# Patient Record
Sex: Female | Born: 1985 | Race: White | Hispanic: Yes | Marital: Married | Smoking: Never smoker
Health system: Southern US, Community
[De-identification: ages and names within clinical notes are randomized; demographics above are authoritative.]

## PROBLEM LIST (undated history)

## (undated) ENCOUNTER — Inpatient Hospital Stay (HOSPITAL_COMMUNITY): Payer: Self-pay

## (undated) DIAGNOSIS — D696 Thrombocytopenia, unspecified: Secondary | ICD-10-CM

## (undated) DIAGNOSIS — F329 Major depressive disorder, single episode, unspecified: Secondary | ICD-10-CM

## (undated) DIAGNOSIS — I10 Essential (primary) hypertension: Secondary | ICD-10-CM

## (undated) DIAGNOSIS — E039 Hypothyroidism, unspecified: Secondary | ICD-10-CM

## (undated) DIAGNOSIS — F32A Depression, unspecified: Secondary | ICD-10-CM

## (undated) DIAGNOSIS — K219 Gastro-esophageal reflux disease without esophagitis: Secondary | ICD-10-CM

## (undated) DIAGNOSIS — G4730 Sleep apnea, unspecified: Secondary | ICD-10-CM

## (undated) DIAGNOSIS — R112 Nausea with vomiting, unspecified: Secondary | ICD-10-CM

## (undated) DIAGNOSIS — Z992 Dependence on renal dialysis: Secondary | ICD-10-CM

## (undated) DIAGNOSIS — N186 End stage renal disease: Secondary | ICD-10-CM

## (undated) DIAGNOSIS — Z9889 Other specified postprocedural states: Secondary | ICD-10-CM

## (undated) DIAGNOSIS — N059 Unspecified nephritic syndrome with unspecified morphologic changes: Secondary | ICD-10-CM

## (undated) DIAGNOSIS — D649 Anemia, unspecified: Secondary | ICD-10-CM

## (undated) HISTORY — DX: Sleep apnea, unspecified: G47.30

## (undated) HISTORY — DX: Major depressive disorder, single episode, unspecified: F32.9

## (undated) HISTORY — DX: End stage renal disease: N18.6

## (undated) HISTORY — DX: Anemia, unspecified: D64.9

## (undated) HISTORY — PX: AV FISTULA PLACEMENT: SHX1204

## (undated) HISTORY — DX: Depression, unspecified: F32.A

---

## 2007-08-12 ENCOUNTER — Ambulatory Visit (HOSPITAL_COMMUNITY): Admission: RE | Admit: 2007-08-12 | Discharge: 2007-08-12 | Payer: Self-pay | Admitting: Family Medicine

## 2007-09-22 ENCOUNTER — Ambulatory Visit (HOSPITAL_COMMUNITY): Admission: RE | Admit: 2007-09-22 | Discharge: 2007-09-22 | Payer: Self-pay | Admitting: Obstetrics & Gynecology

## 2007-10-14 ENCOUNTER — Ambulatory Visit (HOSPITAL_COMMUNITY): Admission: RE | Admit: 2007-10-14 | Discharge: 2007-10-14 | Payer: Self-pay | Admitting: Obstetrics & Gynecology

## 2007-12-01 ENCOUNTER — Inpatient Hospital Stay (HOSPITAL_COMMUNITY): Admission: AD | Admit: 2007-12-01 | Discharge: 2007-12-04 | Payer: Self-pay | Admitting: Obstetrics & Gynecology

## 2007-12-01 ENCOUNTER — Ambulatory Visit: Payer: Self-pay | Admitting: Obstetrics & Gynecology

## 2011-03-06 NOTE — Op Note (Signed)
NAMEGWENDOLEN, Karla Garner                ACCOUNT NO.:  [id number]   MEDICAL RECORD NO.:  [id number]          PATIENT TYPE:  INP   LOCATION:  9126                          FACILITY:  [facility]   PHYSICIAN:  Ruthe Mannan, MD    DATE OF BIRTH:  1986-01-22   DATE OF PROCEDURE:  12/01/2007  DATE OF DISCHARGE:                               OPERATIVE REPORT   PREOPERATIVE DIAGNOSIS:  Intrauterine pregnancy at 38-3/7th weeks  gestation, fetal breech presentation, in active labor.   POSTOPERATIVE DIAGNOSIS:  Intrauterine pregnancy at 38-3/7th weeks  gestation, fetal breech presentation, in active labor.   PROCEDURE:  Primary low transverse cesarean section via Pfannenstiel.   SURGEON:  Dr. Nechama Guard Duru.   ANESTHESIA:  Spinal.   FLUIDS:  2300 mL of lactated Ringer's.   ESTIMATED BLOOD LOSS:  700 mL.   URINE OUTPUT:  500 mL of clear yellow urine at the end of the procedure.   INDICATIONS FOR PROCEDURE:  The patient is a 25 year old G 1, P 0, who  presented at 38-3/7th weeks, in active labor.  The patient was noted to  progress from 1 cm to 5 cm dilation in one to two hours.  On her  cervical check, it was noted that the patient had a breech presentation  which was confirmed with the ultrasound. Given that she was in active  labor with a breech presentation, the patient was counseled regarding  the necessity of a cesarean section.  The risks of the surgery were  explained to the patient with the aid of a Spanish interpreter, and  written informed consent was obtained.   FINDINGS:  Viable female infant in frank breech presentation.  Apgars were  8 and 9.  Weight 5 pounds 2 ounces.  A spontaneous placental delivery,  intact with a three-vessel cord.  Normal uterus, ovaries and tubes  bilaterally.   SPECIMENS:  Placenta which was sent to labor and delivery.   COMPLICATIONS:  None.   DESCRIPTION OF PROCEDURE:  The patient was taken to the operating room  where spinal anesthesia was induced  and found to be adequate.  She was  then placed in a dorsal supine position with a leftward tilt, and  prepped and draped in a sterile manner.  A Pfannenstiel incision was  made with a scalpel and carried through to the underlying layer of  fascia.  The fascia was incised in the midline and this incision was  extended bilaterally using Mayo scissors.  Kocher clamps were applied to  the superior aspect of the fascial incision, and the rectus muscles were  dissected out bluntly and sharply.  A similar process  was carried out  on the inferior aspect of the incision.  The rectus muscles were  separated in the midline, and the peritoneum was entered bluntly.  The  peritoneal incision was extended with good visualization of the bowel  and bladder.  The bladder blade was then inserted.  Attention was then  turned to the lower uterine segment where a bladder flap was created.  A  transverse hysterotomy was made using the  scalpel, and extended bluntly  bilaterally.  The infant was then delivered atraumatically in the breech  presentation.  The baby was noted to have a body cord, and also noted to  have a question of a hydrocele.  The baby's cord was clamped and cut and  handed over to the waiting pediatricians .  Cord blood was collected  according to protocol.  Uterine massage was then administered, and the  placenta was delivered intact.  The uterus was then exteriorized and  cleared of all clot and debris.  The hysterotomy was repaired using #0  Monocryl in a running lock fashion.  A second layer of #0 Monocryl was  used as an imbricating stitch.  Overall good hemostasis was noted.  The  uterus was then returned to the abdomen, and the gutters were cleared of  all clot and debris.  The muscles were reapproximated using two  interrupted stitches of #0 Monocryl.  The fascia was reapproximated  using #0 Vicryl in a running stitch, and the skin was closed with  staples; 10 mL of 0.25% Marcaine was  injected for further local  analgesia.  The patient tolerated the procedure well.  The sponge,  instrument and needle counts were correct x2.  She was taken to the  recovery room in stable condition.      Ruthe Mannan, MD  Electronically Signed     UAD/MEDQ  D:  12/01/2007  T:  12/02/2007  Job:  [id number]

## 2011-03-06 NOTE — Discharge Summary (Signed)
NAMESHEMICKA, Karla Garner      ACCOUNT NO.:  [id number]   MEDICAL RECORD NO.:  [id number]          PATIENT TYPE:  INP   LOCATION:  9126                          FACILITY:  [facility]   PHYSICIAN:  Willey Blade, MD  DATE OF BIRTH:  [birth year]   DATE OF ADMISSION:  12/01/2007  DATE OF DISCHARGE:  12/04/2007                               DISCHARGE SUMMARY   DISCHARGE DIAGNOSES:  1. Primary low transverse cesarean section secondary to breech      presentation of baby.  2. Delivery of viable baby boy.   PROCEDURES:  Karla Garner had transverse C-section.   DISCHARGE LABORATORY:  Hemoglobin 11.2, hematocrit 32.2.  RPR  nonreactive.  GBS negative.  O positive, antibody negative.  HB surface  antigen negative.  Rubella immune, HIV nonreactive.  Chlamydia and  Gonorrhea negative.  Quad screen not done secondary to too late prenatal  care.   DISCHARGE MEDICATIONS:  1. Percocet 5/325  mg 1 p.o. every 4 hours p.r.n. pain.  2. Colace 100 mg 1 p.o. b.i.d. p.r.n. constipation #3.  3. Prenatal vitamins 1 p.o. daily while breast feeding.   HOSPITAL COURSE:  In brief this is a G65, P78 25 year old Hispanic female,  Spanish speaking only who presented in active labor and was found to  have breech presentation so was sent to primary low-segment transverse  cesarean section by Dr.Duru under spinal anesthesia.  No complications  were noted. Estimated blood loss was 700 ml and result was delivery of a  viable baby boy in frank breech presentation.  Apgar 8 and 9 at one and  five minutes respectively. Weight was 5 pounds 2 ounces.  Spontaneous  placenta delivered with 3-vessel cord.  Placenta was sent to L and D.  The patient tolerated the procedure well and wants to breast and bottle  feed baby.  No birth control is desired and no circumcision is desired.  Upon discharge, the patient had decreased pain, decreased bleeding,  positive flatus, was tolerating p.o. diet and ambulating and was voiding  well.   Staples removed prior to discharge.   FOLLOWUP:  The patient to followup with [facility] Department  for 6 weeks postpartum check.      Ria Bush, MD      Willey Blade, MD  Electronically Signed    JG/MEDQ  D:  12/04/2007  T:  12/05/2007  Job:  [id number]

## 2011-03-26 ENCOUNTER — Other Ambulatory Visit (HOSPITAL_COMMUNITY): Payer: Self-pay | Admitting: Nephrology

## 2011-03-26 DIAGNOSIS — R809 Proteinuria, unspecified: Secondary | ICD-10-CM

## 2011-04-03 ENCOUNTER — Other Ambulatory Visit: Payer: Self-pay | Admitting: Interventional Radiology

## 2011-04-03 ENCOUNTER — Ambulatory Visit (HOSPITAL_COMMUNITY)
Admission: RE | Admit: 2011-04-03 | Discharge: 2011-04-03 | Disposition: A | Discharge status: Home / Self Care | Payer: Self-pay | Source: Ambulatory Visit | Attending: Nephrology | Admitting: Nephrology

## 2011-04-03 DIAGNOSIS — R809 Proteinuria, unspecified: Secondary | ICD-10-CM | POA: Insufficient documentation

## 2011-04-03 LAB — PROTIME-INR: Prothrombin Time: 11.6 seconds (ref 11.6–15.2)

## 2011-04-03 LAB — CBC
HCT: 36.1 % (ref 36.0–46.0)
Hemoglobin: 12.4 g/dL (ref 12.0–15.0)
MCH: 28.2 pg (ref 26.0–34.0)
MCHC: 34.3 g/dL (ref 30.0–36.0)
MCV: 82 fL (ref 78.0–100.0)
Platelets: 284 K/uL (ref 150–400)
RBC: 4.4 MIL/uL (ref 3.87–5.11)
RDW: 14.6 % (ref 11.5–15.5)
WBC: 13.4 K/uL — ABNORMAL HIGH (ref 4.0–10.5)

## 2011-04-16 ENCOUNTER — Ambulatory Visit (HOSPITAL_COMMUNITY): Payer: Self-pay | Attending: Orthopedic Surgery

## 2011-04-16 DIAGNOSIS — R809 Proteinuria, unspecified: Secondary | ICD-10-CM | POA: Insufficient documentation

## 2011-05-14 ENCOUNTER — Other Ambulatory Visit: Payer: Self-pay | Admitting: Nephrology

## 2011-05-14 ENCOUNTER — Encounter (HOSPITAL_COMMUNITY): Payer: Self-pay | Attending: Nephrology

## 2011-05-14 DIAGNOSIS — R809 Proteinuria, unspecified: Secondary | ICD-10-CM | POA: Insufficient documentation

## 2011-05-14 LAB — RENAL FUNCTION PANEL
CO2: 27 mEq/L (ref 19–32)
Calcium: 7.4 mg/dL — ABNORMAL LOW (ref 8.4–10.5)
Chloride: 103 mEq/L (ref 96–112)
Creatinine, Ser: 1.12 mg/dL — ABNORMAL HIGH (ref 0.50–1.10)
Glucose, Bld: 109 mg/dL — ABNORMAL HIGH (ref 70–99)
Potassium: 3 mEq/L — ABNORMAL LOW (ref 3.5–5.1)
Sodium: 137 mEq/L (ref 135–145)

## 2011-05-14 LAB — CBC
HCT: 32.9 % — ABNORMAL LOW (ref 36.0–46.0)
MCH: 28.9 pg (ref 26.0–34.0)
MCHC: 34.7 g/dL (ref 30.0–36.0)
MCV: 83.3 fL (ref 78.0–100.0)
RBC: 3.95 MIL/uL (ref 3.87–5.11)
RDW: 14.4 % (ref 11.5–15.5)

## 2011-06-11 ENCOUNTER — Encounter (HOSPITAL_COMMUNITY): Payer: Self-pay | Attending: Nephrology

## 2011-06-11 DIAGNOSIS — R809 Proteinuria, unspecified: Secondary | ICD-10-CM | POA: Insufficient documentation

## 2011-07-09 ENCOUNTER — Encounter (HOSPITAL_COMMUNITY): Payer: Self-pay

## 2011-07-09 ENCOUNTER — Encounter (HOSPITAL_COMMUNITY): Payer: Self-pay | Attending: Nephrology

## 2011-07-09 ENCOUNTER — Other Ambulatory Visit: Payer: Self-pay | Admitting: Nephrology

## 2011-07-09 DIAGNOSIS — N059 Unspecified nephritic syndrome with unspecified morphologic changes: Secondary | ICD-10-CM | POA: Insufficient documentation

## 2011-07-09 LAB — RENAL FUNCTION PANEL
BUN: 19 mg/dL (ref 6–23)
CO2: 26 mEq/L (ref 19–32)
Calcium: 8.6 mg/dL (ref 8.4–10.5)
Chloride: 101 mEq/L (ref 96–112)
Sodium: 135 mEq/L (ref 135–145)

## 2011-07-09 LAB — CBC
HCT: 28.2 % — ABNORMAL LOW (ref 36.0–46.0)
MCH: 28.7 pg (ref 26.0–34.0)
MCHC: 35.5 g/dL (ref 30.0–36.0)
MCV: 81 fL (ref 78.0–100.0)
WBC: 7.8 10*3/uL (ref 4.0–10.5)

## 2011-07-09 LAB — DIFFERENTIAL
Basophils Relative: 1 % (ref 0–1)
Eosinophils Relative: 1 % (ref 0–5)
Lymphocytes Relative: 18 % (ref 12–46)
Monocytes Absolute: 0.7 10*3/uL (ref 0.1–1.0)
Monocytes Relative: 9 % (ref 3–12)
Neutro Abs: 5.6 10*3/uL (ref 1.7–7.7)

## 2011-07-09 LAB — HEPATIC FUNCTION PANEL
Alkaline Phosphatase: 72 U/L (ref 39–117)
Total Bilirubin: 0.3 mg/dL (ref 0.3–1.2)
Total Protein: 6 g/dL (ref 6.0–8.3)

## 2011-07-13 LAB — RPR: RPR Ser Ql: NONREACTIVE

## 2011-07-13 LAB — CBC
HCT: 32.2 — ABNORMAL LOW
HCT: 40.8
MCHC: 34.2
MCV: 86
RBC: 4.75
RDW: 15.6 — ABNORMAL HIGH
WBC: 10.5

## 2011-08-06 ENCOUNTER — Other Ambulatory Visit: Payer: Self-pay | Admitting: Nephrology

## 2011-08-06 ENCOUNTER — Encounter (HOSPITAL_COMMUNITY): Payer: Self-pay | Attending: Nephrology

## 2011-08-06 DIAGNOSIS — N059 Unspecified nephritic syndrome with unspecified morphologic changes: Secondary | ICD-10-CM | POA: Insufficient documentation

## 2011-08-06 LAB — HEPATIC FUNCTION PANEL
Albumin: 3 g/dL — ABNORMAL LOW (ref 3.5–5.2)
Bilirubin, Direct: <0.1 mg/dL (ref 0.0–0.3)
Total Protein: 7.4 g/dL (ref 6.0–8.3)

## 2011-08-06 LAB — RENAL FUNCTION PANEL
Albumin: 3.1 g/dL — ABNORMAL LOW (ref 3.5–5.2)
BUN: 24 mg/dL — ABNORMAL HIGH (ref 6–23)
CO2: 26 mEq/L (ref 19–32)
Calcium: 9.2 mg/dL (ref 8.4–10.5)
Creatinine, Ser: 1.56 mg/dL — ABNORMAL HIGH (ref 0.50–1.10)
GFR calc non Af Amer: 46 mL/min — ABNORMAL LOW (ref >90)
Glucose, Bld: 86 mg/dL (ref 70–99)
Phosphorus: 5.5 mg/dL — ABNORMAL HIGH (ref 2.3–4.6)
Potassium: 3.2 mEq/L — ABNORMAL LOW (ref 3.5–5.1)

## 2011-08-06 LAB — DIFFERENTIAL
Eosinophils Relative: 2 % (ref 0–5)
Lymphs Abs: 1.3 10*3/uL (ref 0.7–4.0)
Monocytes Absolute: 0.6 10*3/uL (ref 0.1–1.0)
Monocytes Relative: 9 % (ref 3–12)
Neutro Abs: 4.8 10*3/uL (ref 1.7–7.7)

## 2011-08-06 LAB — CBC
Hemoglobin: 10.7 g/dL — ABNORMAL LOW (ref 12.0–15.0)
Platelets: 261 10*3/uL (ref 150–400)
WBC: 7.3 10*3/uL (ref 4.0–10.5)

## 2011-08-25 ENCOUNTER — Other Ambulatory Visit (HOSPITAL_COMMUNITY): Payer: Self-pay | Admitting: *Deleted

## 2011-08-31 ENCOUNTER — Other Ambulatory Visit (HOSPITAL_COMMUNITY): Payer: Self-pay | Admitting: *Deleted

## 2011-09-03 ENCOUNTER — Ambulatory Visit (HOSPITAL_COMMUNITY)
Admission: RE | Admit: 2011-09-03 | Discharge: 2011-09-03 | Disposition: A | Discharge status: Home / Self Care | Payer: Self-pay | Source: Ambulatory Visit | Attending: Nephrology | Admitting: Nephrology

## 2011-09-03 ENCOUNTER — Encounter (HOSPITAL_COMMUNITY): Payer: Self-pay

## 2011-09-03 DIAGNOSIS — N059 Unspecified nephritic syndrome with unspecified morphologic changes: Secondary | ICD-10-CM | POA: Insufficient documentation

## 2011-09-03 HISTORY — DX: Unspecified nephritic syndrome with unspecified morphologic changes: N05.9

## 2011-09-03 LAB — COMPREHENSIVE METABOLIC PANEL
AST: 22 U/L (ref 0–37)
CO2: 20 mEq/L (ref 19–32)
Calcium: 8.8 mg/dL (ref 8.4–10.5)
Creatinine, Ser: 1.58 mg/dL — ABNORMAL HIGH (ref 0.50–1.10)
GFR calc non Af Amer: 45 mL/min — ABNORMAL LOW (ref >90)
Total Protein: 6.8 g/dL (ref 6.0–8.3)

## 2011-09-03 LAB — DIFFERENTIAL
Basophils Absolute: 0 10*3/uL (ref 0.0–0.1)
Lymphocytes Relative: 19 % (ref 12–46)
Lymphs Abs: 1.2 10*3/uL (ref 0.7–4.0)
Monocytes Absolute: 0.5 10*3/uL (ref 0.1–1.0)
Neutro Abs: 4.9 10*3/uL (ref 1.7–7.7)

## 2011-09-03 LAB — CBC
MCH: 28.3 pg (ref 26.0–34.0)
MCHC: 34.3 g/dL (ref 30.0–36.0)
MCV: 82.5 fL (ref 78.0–100.0)
Platelets: 217 10*3/uL (ref 150–400)
RBC: 3.6 MIL/uL — ABNORMAL LOW (ref 3.87–5.11)
RDW: 13.4 % (ref 11.5–15.5)

## 2011-09-03 MED ORDER — SODIUM CHLORIDE 0.9 % IV SOLN
300.0000 mg | Freq: Once | INTRAVENOUS | Status: AC
  Administered 2011-09-03: 300 mg via INTRAVENOUS
  Filled 2011-09-03: qty 3

## 2011-09-03 MED ORDER — SODIUM CHLORIDE 0.9 % IV SOLN: INTRAVENOUS | Status: AC

## 2011-09-03 MED ORDER — ACETAMINOPHEN 325 MG PO TABS
ORAL_TABLET | ORAL | Status: AC
  Filled 2011-09-03: qty 2

## 2011-09-03 MED ORDER — ACETAMINOPHEN 325 MG PO TABS
650.0000 mg | ORAL_TABLET | Freq: Four times a day (QID) | ORAL | Status: DC | PRN
  Administered 2011-09-03: 650 mg via ORAL

## 2011-09-03 MED ORDER — SODIUM CHLORIDE 0.9 % IV SOLN
32.0000 mg | Freq: Once | INTRAVENOUS | Status: AC
  Administered 2011-09-03: 32 mg via INTRAVENOUS
  Filled 2011-09-03: qty 16

## 2011-09-03 MED ORDER — SODIUM CHLORIDE 0.9 % IV SOLN
INTRAVENOUS | Status: AC
  Administered 2011-09-03: 10:00:00 via INTRAVENOUS

## 2011-09-03 MED ORDER — ONDANSETRON HCL 4 MG/2ML IJ SOLN: 4.0000 mg | INTRAMUSCULAR | Status: DC

## 2011-09-03 MED ORDER — SODIUM CHLORIDE 0.9 % IV SOLN
500.0000 mg/m2 | Freq: Once | INTRAVENOUS | Status: AC
  Administered 2011-09-03: 640 mg via INTRAVENOUS
  Filled 2011-09-03: qty 32

## 2011-09-03 MED ORDER — LORAZEPAM 1 MG PO TABS
1.0000 mg | ORAL_TABLET | Freq: Once | ORAL | Status: AC
  Administered 2011-09-03: 1 mg via ORAL
  Filled 2011-09-03: qty 1

## 2011-09-03 MED ORDER — SODIUM CHLORIDE 0.9 % IV SOLN: INTRAVENOUS | Status: DC

## 2011-09-03 NOTE — Progress Notes (Signed)
Interpreter  DORI [phone]

## 2011-10-03 ENCOUNTER — Other Ambulatory Visit (HOSPITAL_COMMUNITY): Payer: Self-pay

## 2012-10-22 DIAGNOSIS — D696 Thrombocytopenia, unspecified: Secondary | ICD-10-CM

## 2012-10-22 HISTORY — DX: Thrombocytopenia, unspecified: D69.6

## 2012-10-22 HISTORY — PX: TUBAL LIGATION: SHX77

## 2012-11-26 ENCOUNTER — Encounter: Payer: Self-pay | Admitting: *Deleted

## 2012-12-02 ENCOUNTER — Inpatient Hospital Stay (HOSPITAL_COMMUNITY): Admission: AD | Admit: 2012-12-02 | Payer: Self-pay | Admitting: Obstetrics & Gynecology

## 2013-01-07 ENCOUNTER — Ambulatory Visit (INDEPENDENT_AMBULATORY_CARE_PROVIDER_SITE_OTHER): Payer: Self-pay | Admitting: Advanced Practice Midwife

## 2013-01-07 ENCOUNTER — Encounter: Payer: Self-pay | Admitting: Advanced Practice Midwife

## 2013-01-07 ENCOUNTER — Other Ambulatory Visit: Payer: Self-pay | Admitting: Advanced Practice Midwife

## 2013-01-07 VITALS — BP 138/103 | Temp 98.2°F | Ht <= 58 in | Wt 93.9 lb

## 2013-01-07 DIAGNOSIS — O09219 Supervision of pregnancy with history of pre-term labor, unspecified trimester: Secondary | ICD-10-CM | POA: Insufficient documentation

## 2013-01-07 DIAGNOSIS — O09212 Supervision of pregnancy with history of pre-term labor, second trimester: Secondary | ICD-10-CM

## 2013-01-07 DIAGNOSIS — N059 Unspecified nephritic syndrome with unspecified morphologic changes: Secondary | ICD-10-CM

## 2013-01-07 DIAGNOSIS — Z23 Encounter for immunization: Secondary | ICD-10-CM

## 2013-01-07 DIAGNOSIS — I10 Essential (primary) hypertension: Secondary | ICD-10-CM

## 2013-01-07 DIAGNOSIS — O10019 Pre-existing essential hypertension complicating pregnancy, unspecified trimester: Secondary | ICD-10-CM | POA: Insufficient documentation

## 2013-01-07 DIAGNOSIS — O34219 Maternal care for unspecified type scar from previous cesarean delivery: Secondary | ICD-10-CM

## 2013-01-07 DIAGNOSIS — Z124 Encounter for screening for malignant neoplasm of cervix: Secondary | ICD-10-CM

## 2013-01-07 LAB — COMPREHENSIVE METABOLIC PANEL
AST: 17 U/L (ref 0–37)
Albumin: 3.5 g/dL (ref 3.5–5.2)
BUN: 40 mg/dL — ABNORMAL HIGH (ref 6–23)
Calcium: 8.4 mg/dL (ref 8.4–10.5)
Chloride: 103 mEq/L (ref 96–112)
Glucose, Bld: 81 mg/dL (ref 70–99)
Potassium: 4.4 mEq/L (ref 3.5–5.3)

## 2013-01-07 LAB — POCT URINALYSIS DIP (DEVICE)
Ketones, ur: NEGATIVE mg/dL
Protein, ur: >=300 mg/dL — AB
pH: 7 (ref 5.0–8.0)

## 2013-01-07 LAB — HIV ANTIBODY (ROUTINE TESTING W REFLEX): HIV: NONREACTIVE

## 2013-01-07 MED ORDER — INFLUENZA VIRUS VACC SPLIT PF IM SUSP: 0.5000 mL | Freq: Once | INTRAMUSCULAR | Status: DC

## 2013-01-07 MED ORDER — PRENATAL VITAMINS 0.8 MG PO TABS: 1.0000 | ORAL_TABLET | Freq: Every day | ORAL | Status: DC

## 2013-01-07 MED ORDER — LABETALOL HCL 200 MG PO TABS: 200.0000 mg | ORAL_TABLET | Freq: Two times a day (BID) | ORAL | Status: DC

## 2013-01-07 MED ORDER — HYDROXYPROGESTERONE CAPROATE 250 MG/ML IM OIL: 250.0000 mg | TOPICAL_OIL | INTRAMUSCULAR | Status: DC

## 2013-01-07 MED ORDER — HYDROXYPROGESTERONE CAPROATE 250 MG/ML IM OIL
250.0000 mg | TOPICAL_OIL | Freq: Once | INTRAMUSCULAR | Status: AC
  Administered 2013-01-26: 250 mg via INTRAMUSCULAR

## 2013-01-07 NOTE — Addendum Note (Signed)
Addended by: Langston Reusing on: 01/07/2013 11:38 AM   Modules accepted: Orders, Medications

## 2013-01-07 NOTE — Progress Notes (Signed)
Pulse- 96  Pressure-"baby pressing on bladder"  Vaginal discharge-"yellow discharge with odor that did not notice before pregnancy" New ob packet given BP Recheck 154/98 Flu vaccine consented Weight gain of 25-35lbs  Pt states that she has lost a lot of weight due to vomiting x 3-4 timed per day.  Is unable to keep any food down

## 2013-01-07 NOTE — Progress Notes (Signed)
Subjective:    Karla Garner is being seen today for her first obstetrical visit. She is at [redacted]w[redacted]d gestation. Her obstetrical history is significant for previous preterm delivery at 35 weeks and h/o glomerulonephritis with high blood pressure. Relationship with FOB: spouse, living together. Pregnancy history fully reviewed.  Patient reports headache, nausea, malodorous discharge, vaginal irritation, vomiting and dizziness; Denies bleeding, contractions, cramping, leaking, fever, vision changes, syncope, or swelling.  PMH:  - Glomerulonephritis 1.5 years ago, pt reports received medicine (cyclophosphamide seen in system) IV monthly x 3 months and then followed up with Dr. Justin Mend [facility]) until 7 months ago when had transportation issues. Currently no symptoms (edema) but also has high bp. - HTN: Pt was on an antihypertensive and was changed to something else (called pharmacy - Aldomet 250mg  BID) that she was taking daily. Checks BP frequently at [employer] and SBP 140s. BP today is elevated. Cyndie Mull [id number] - C-section 2009 due to breech presentation, VBAC 2011; h/o preterm delivery 35 weeks; LMP 07-31-12 - Never had pap; no flu shot this year - Surgeries/Hospitalizations - C-section 2009, renal biopsy 2-3 years ago, hospitalized for 2 prior deliveries - Medications: Aldomet 250mg  BID - Pt taking daily - NKDA  SH:  - Lives with children's father, 2 children (46 and 2 yo), denies DV - Denies tobacco, drugs, alcohol; husband smokes outside - Does not work  FH: No medical problems, childhood diseases, early childhood deaths per pt  Review of Systems:   Review of Systems - Per HPI  Objective:    BP 138/103  Temp(Src) 98.2 F (36.8 C)  Ht 4\' 9"  (1.448 m)  Wt 93 lb 14.4 oz (42.593 kg)  BMI 20.31 kg/m2  LMP 07/31/2012 BP on recheck 154/98 Physical Exam  Exam - See flowsheet. Additionally, GEN: NAD, lying on exam table CV: RRR, no murmurs, rubs, gallops; 2+ bilateral DP  pulses present PULM: CTAB, no wheezes or crackles, normal effort ABD: Gravid, 23 cm fundal height, nontender, fetal movement witnessed BACK: No cva tenderness NEURO: Alert, no focal deficits EXTR: No upper or LE edema  GU: Normal external genitalia; on speculum exam, thin clear-whitish discharge; cervix appears red and friable; bimanual exam, no cervical motion tenderness, dilated 1cm, thick, soft; no masses palpated    Assessment:    Pregnancy: [id number] at [redacted]w[redacted]d with normal growth and fetal heart tones, at risk due to maternal renal disease, HTN, and h/o PTD Patient Active Problem List  Diagnosis  . Previous cesarean delivery affecting pregnancy, antepartum  . Glomerulonephritis  . HTN (hypertension)  . High risk pregnancy due to history of preterm labor       Plan:    Initial labs drawn (OB panel, GC/Chlamydia, pap) along with HIV, wet prep, CMET. H/o glomerulonephritis with current elevated BP, less likely secondary to pre-eclampsia than glomerulonephritis:  - Strongly recommended f/u with [facility] as soon as possible for further recommendations - Discontinued aldomet and started labetalol 200mg  BID- rx sent and pharmacy called - Due to danger to fetus of maternal renal disease, along with h/o preterm delivery, f/u with [facility] and consulted MFM for OB anatomy/detailled [place] - 24 hour urine and CMET ordered - ROI filled out for records from [facility] where pt reports dr visit Due to previous preterm delivery, recommend 17-p which pt is amenable to. Ordered weekly 17p shots. Vaginal itching - wet prep today. Flu shot given, Prenatal vitamins ordered. Problem list reviewed and updated. Genetic screening:  Too late for integrated and quad screen. Follow up in 2 weeks in [facility]. F/u on BP, headache, nausea. Delivery planning at f/u.  >50% of 45 min visit spent on counseling and coordination of care.  Discussed plan with attending OB on  call.   Conni Slipper MD [facility] Teaching Service PGY-1 01/07/2013

## 2013-01-07 NOTE — Patient Instructions (Addendum)
Mucho gusto!  Para este embarazo, recomiendo estas medicinas: - vitaminas prenatales diaro - una medicamento para evitar parto temprano, llamado 17-p, cada semana - medicamento para presion alta. (Voy a cambiar su medicamento a labetalol 200mg  dos veces al dia). No toma su otra medicina. Tambien recomiendo que ud ir a su doctor de rinones para un chequeo [phone]) Porque tiene problemas de su rinon, recomendamos que ud ir a la clinica de alta riesgo y Education officer, community las obstetricas para alta riesgo - Ayudaremos a ud Air traffic controller cita. Necesita una ultrasonido. Ayudaremos a Systems analyst cita. Estamos colectando laboratorios hoy. Ud esta recibiendo vacuna contra flu hoy. Si desarolla algunos preocupaciones nos llame. Si tiene [employer], cambios de vision, contraciones, rompe de fuentes, o sangre vaginal, o el bebe para moviendo, llame 9-1-1 y regresa a [place] inmediatamente.  ---------------------------------------------------------- It was good to meet you!  For this pregnancy, we recommend the following medicines: - Prenatal vitamins daily - A medication called 17-p to avoid early delivery, weekly - Medicine for high blood pressure (switching you from your current medicine to labetalol 200mg  TWICE DAILY). Stop taking your other medicine. I also recommend that you see your Kidney Dr (Dr Justin Mend at [facility] [phone]) Because you have kidney problems, I recommend that you go to the high risk clinic and also see a Maternal-Fetal-Medicine high risk Obstetrician. We will help you make this appointment. You need an ultrasound. We will help you make this appointment. We are collecting labs today. You are getting a flu shot today. If you develop concerns, call us. If you have fainting, changes of vision, contractions, your water breaks, or you have vaginal bleeding, or the baby stops moving, call 9-1-1 and go to [facility] immediately.

## 2013-01-08 ENCOUNTER — Other Ambulatory Visit: Payer: Self-pay | Admitting: Obstetrics and Gynecology

## 2013-01-08 DIAGNOSIS — Z0489 Encounter for examination and observation for other specified reasons: Secondary | ICD-10-CM

## 2013-01-08 LAB — OBSTETRIC PANEL
Antibody Screen: NEGATIVE
Basophils Relative: 0 % (ref 0–1)
Eosinophils Absolute: 0 10*3/uL (ref 0.0–0.7)
HCT: 30 % — ABNORMAL LOW (ref 36.0–46.0)
Hemoglobin: 10.6 g/dL — ABNORMAL LOW (ref 12.0–15.0)
MCH: 28.6 pg (ref 26.0–34.0)
MCHC: 35.3 g/dL (ref 30.0–36.0)
Monocytes Absolute: 0.5 10*3/uL (ref 0.1–1.0)
Monocytes Relative: 6 % (ref 3–12)
Neutrophils Relative %: 84 % — ABNORMAL HIGH (ref 43–77)
Rh Type: POSITIVE

## 2013-01-08 LAB — WET PREP, GENITAL: Trich, Wet Prep: NONE SEEN

## 2013-01-09 LAB — CULTURE, OB URINE: Colony Count: >=100000

## 2013-01-09 NOTE — Addendum Note (Signed)
Addended by: Ernie Avena on: 01/09/2013 08:23 AM   Modules accepted: Orders

## 2013-01-10 LAB — CREATININE CLEARANCE, URINE, 24 HOUR
Creatinine Clearance: 22 mL/min — ABNORMAL LOW (ref 75–115)
Creatinine, 24H Ur: 905 mg/d (ref 700–1800)
Creatinine: 2.87 mg/dL — ABNORMAL HIGH (ref 0.50–1.10)

## 2013-01-13 ENCOUNTER — Ambulatory Visit (HOSPITAL_COMMUNITY)
Admission: RE | Admit: 2013-01-13 | Discharge: 2013-01-13 | Disposition: A | Discharge status: Home / Self Care | Payer: Self-pay | Source: Ambulatory Visit | Attending: Obstetrics and Gynecology | Admitting: Obstetrics and Gynecology

## 2013-01-13 ENCOUNTER — Encounter (HOSPITAL_COMMUNITY): Payer: Self-pay

## 2013-01-13 VITALS — BP 135/95 | HR 86 | Wt 98.5 lb

## 2013-01-13 DIAGNOSIS — O10019 Pre-existing essential hypertension complicating pregnancy, unspecified trimester: Secondary | ICD-10-CM | POA: Insufficient documentation

## 2013-01-13 DIAGNOSIS — Z0489 Encounter for examination and observation for other specified reasons: Secondary | ICD-10-CM

## 2013-01-13 DIAGNOSIS — Z363 Encounter for antenatal screening for malformations: Secondary | ICD-10-CM | POA: Insufficient documentation

## 2013-01-13 DIAGNOSIS — O34219 Maternal care for unspecified type scar from previous cesarean delivery: Secondary | ICD-10-CM | POA: Insufficient documentation

## 2013-01-13 DIAGNOSIS — O09212 Supervision of pregnancy with history of pre-term labor, second trimester: Secondary | ICD-10-CM

## 2013-01-13 DIAGNOSIS — O10012 Pre-existing essential hypertension complicating pregnancy, second trimester: Secondary | ICD-10-CM

## 2013-01-13 DIAGNOSIS — N059 Unspecified nephritic syndrome with unspecified morphologic changes: Secondary | ICD-10-CM

## 2013-01-13 DIAGNOSIS — O09219 Supervision of pregnancy with history of pre-term labor, unspecified trimester: Secondary | ICD-10-CM | POA: Insufficient documentation

## 2013-01-13 DIAGNOSIS — IMO0002 Reserved for concepts with insufficient information to code with codable children: Secondary | ICD-10-CM

## 2013-01-13 DIAGNOSIS — Z1389 Encounter for screening for other disorder: Secondary | ICD-10-CM | POA: Insufficient documentation

## 2013-01-13 DIAGNOSIS — O358XX0 Maternal care for other (suspected) fetal abnormality and damage, not applicable or unspecified: Secondary | ICD-10-CM | POA: Insufficient documentation

## 2013-01-13 NOTE — Progress Notes (Signed)
[facility] ultrasound  Indication: 27 yr old G79P1102 at [redacted]w[redacted]d by LMP with glomerulonephritis, renal insufficiency, and chronic hypertension and history of preterm delivery for fetal anatomic survey.  Findings: 1. Single intrauterine pregnancy. 2. Fetal biometry is consistent with [redacted]w[redacted]d; 52nd%. 3. Posterior placenta without evidence of previa. 4. Normal amniotic fluid volume. 5. Normal transabdominal cervical length. 6. The view of the palate is limited. 7. The remainder of the anatomic survey is normal.  Recommendations: 1. Based on today's ultrasound recommend using an estimated due date of 04/25/13. This is the patient's first ultrasound and differs from dating by 12 days. 2. Normal anatomic survey; palate views limited. 3. Chronic hypertension: - see consult letter - recommend fetal growth every 4 weeks - recommend antenatal testing starting at 32 weeks - currently on labetalol - recommend close surveillance for the development of signs/symptoms of preeclampsia 4. Glomerulonephritis/proteinuria/renal insufficiency: - see consult letter - fetal growth and surveillance as above - will make delivery timing recommendations based on renal function and clinical status 5. History of preterm delivery: - see consult letter - recommend close surveillance for the development of signs/symptoms of preterm labor/PPROM 6. Follow up in 4 weeks  Karla [place], MD

## 2013-01-13 NOTE — Consult Note (Signed)
MFM consult   27 yr old [id number] at [redacted]w[redacted]d by LMP with glomerulonephritis, renal insufficiency, and chronic hypertension and history of preterm delivery referred by Dr. Elly Modena for fetal anatomic survey and consult.  Past OB hx: Full term C section; no complications PPROM at 42 weeks- VBAC  PMH: diagnosed with hypertension and glomerulonephritis 1-2 years ago- was previously on medical management but has not seen a Nephrologist with this pregnancy  Ultrasound today shows: fetal biometry is consistent with [redacted]w[redacted]d; 52nd%. Posterior placenta without evidence of previa. Normal amniotic fluid volume. Normal transabdominal cervical length. The view of the palate is limited. The remainder of the anatomic survey is normal.  I counseled the patient as follows:  1. Based on today's ultrasound recommend using an estimated due date of 04/25/13. This is the patient's first ultrasound and differs from dating by 12 days.  2. Normal anatomic survey; palate views limited.  3. Chronic hypertension: - I discussed that women with preexistent hypertension are at increased risk of adverse pregnancy outcome, including preeclampsia, abruption, fetal growth restriction, and perinatal death. The risk increases with severity of hypertension and presence of end-organ damage. Risk of superimposed preeclampsia is 10 to 25 %; risk of abruption is 0.7 to 1.5 %; and risk of fetal growth restriction is 8 to 16 %. I also discussed that risk of preterm delivery is increased and is usually iatrogenic from the complications listed above. Risk of preterm delivery in women with chronic hypertension is 12-34%. There is also an increased risk of requiring C section for the above reasons. I discussed her risks of the above complications are likely significantly higher given her renal insufficiency and proteinuria. Patient' hypertension is currently managed with labetalol. We discussed this is a category C medication and is considered safe in  pregnancy although may have an increased risk of fetal growth restriction. I would recommend targeting therapy to keep systolic blood pressures [id number] and diastolic blood pressures [id number]. I recommend obtaining baseline studies: EKG and uric acid; the remainder of the baseline labs were normal with the exception of the 24 hour urine which showed 1.2g of protein I recommend serial ultrasounds for fetal growth every 4 weeks starting at [redacted] weeks gestation. I recommend close surveillance for the development of signs/symptoms of preeclampsia. I recommend antenatal testing to start at [redacted] weeks gestation. - currently on labetalol  4. Glomerulonephritis/proteinuria/renal insufficiency: - Pregnancy can cause worsening of proteinuria, hypertension, and edema that are usually temporary. However, there may be a permanent decline in kidney function of 1-10% in pregnancy. Given her creatinine is already elevated discussed possibility of needing dialysis and permanent kidney failure. At this time potassium is normal but levels need to be followed closely to determine if dialysis is needed. I discussed given the patient's creatinine is elevated at 2.87 and this significantly increases the risk of adverse outcomes in pregnancy.   There is a significantly increased risk of preeclampsia or eclampsia (may be as high as 20-40%). There is an increased risk of fetal growth restriction, preterm birth (either iatrogenic or spontaneous), and stillbirth.  The patient should have frequent prenatal visits- at least every 2 weeks.  Close monitoring of blood pressures and aggressive management of hypertension.  Recommend fetal growth ultrasounds every 4 weeks.  Recommend antenatal testing starting at [redacted] weeks gestation. Recommend Nephrology consult asap- patient reports she has an appointment in April.  Recommend close surveillance of CMP (consider monthly) and monthly protein/creatinine ratio.  Discussed the increased risk of venous  thrombolic  events with significant proteinuria and pregnancy. At this time protein is 1.2g; if worsens would recommend prophylaxis with lovenox or if there is another risk factor for thromboembolic event (prolonged bed rest, C section, etc) - fetal growth and surveillance as above - will make delivery timing recommendations based on renal function and clinical status  5. History of preterm delivery: - I discussed that with a history of preterm delivery the risk is increased in future pregnancies. Studies have shown that the frequency of recurrent preterm birth was 24 to 22 % after one preterm delivery, 28 to 42 % after two preterm deliveries, and 67% after three preterm deliveries. Term births decreased the risk of preterm birth in subsequent pregnancies.   Patient inquired about weekly injections of 17 OH progesterone. I discussed that use from 16-36 weeks has been shown to decrease the risk of recurrent preterm birth by up to 40%. I discussed we do not know of any adverse effects of progesterone however long term data is limited. However discussed unknown efficacy if started after 25 weeks in pregnancy. Discussed may have potential benefit and may not. Patient reports she likely does not wish to use 17OH progesterone- she will discuss with her primary OB - recommend close surveillance for the development of signs/symptoms of preterm labor/PPROM  6. Follow up in 4 weeks  I spent 60 minutes in face to face consultation with the patient in addition to time spent on the ultrasound.  [place], MD

## 2013-01-14 ENCOUNTER — Encounter: Payer: Self-pay | Admitting: Obstetrics and Gynecology

## 2013-01-14 ENCOUNTER — Encounter: Payer: Self-pay | Admitting: *Deleted

## 2013-01-14 ENCOUNTER — Telehealth: Payer: Self-pay | Admitting: *Deleted

## 2013-01-14 NOTE — Telephone Encounter (Signed)
Will send to [facility] as [phone]

## 2013-01-14 NOTE — Telephone Encounter (Signed)
Called [facility] and left and was told by Suanne Marker patient formerly seen by Dr. Justin Mend- connected to his scheduler Pam and left a message we need for patient to [facility] care with Dr. Justin Mend and please call us with appointment and we will call the patient.,

## 2013-01-14 NOTE — Telephone Encounter (Signed)
Message copied by Samuel Germany on Wed Jan 14, 2013  4:08 PM ------      Message from: [place], [place] L      Created: Thu Jan 08, 2013  2:41 PM      Regarding: referral       Needs referral back to [facility] ASAP            We told her yesterday at [facility] to go back to them, but fear she won't call. Has not been there in 7 mos            Creatinine is way higher... Renal failure maybe            Their number is:      [address], [place], [place] [address]            [phone] ------

## 2013-01-14 NOTE — Telephone Encounter (Signed)
[facility] called back and states patient or someone calling for her had already called back and made an appointment for 01/28/13. Office notes faxed per request.

## 2013-01-19 ENCOUNTER — Encounter: Payer: Self-pay | Admitting: Obstetrics and Gynecology

## 2013-01-19 ENCOUNTER — Other Ambulatory Visit: Payer: Self-pay | Admitting: Obstetrics and Gynecology

## 2013-01-19 ENCOUNTER — Ambulatory Visit (INDEPENDENT_AMBULATORY_CARE_PROVIDER_SITE_OTHER): Payer: Self-pay | Admitting: Obstetrics and Gynecology

## 2013-01-19 VITALS — BP 139/96 | Temp 98.6°F | Wt 95.5 lb

## 2013-01-19 DIAGNOSIS — O09219 Supervision of pregnancy with history of pre-term labor, unspecified trimester: Secondary | ICD-10-CM

## 2013-01-19 DIAGNOSIS — O10019 Pre-existing essential hypertension complicating pregnancy, unspecified trimester: Secondary | ICD-10-CM

## 2013-01-19 DIAGNOSIS — O34219 Maternal care for unspecified type scar from previous cesarean delivery: Secondary | ICD-10-CM

## 2013-01-19 DIAGNOSIS — O09212 Supervision of pregnancy with history of pre-term labor, second trimester: Secondary | ICD-10-CM

## 2013-01-19 DIAGNOSIS — N059 Unspecified nephritic syndrome with unspecified morphologic changes: Secondary | ICD-10-CM

## 2013-01-19 DIAGNOSIS — O10012 Pre-existing essential hypertension complicating pregnancy, second trimester: Secondary | ICD-10-CM

## 2013-01-19 LAB — POCT URINALYSIS DIP (DEVICE)
Glucose, UA: NEGATIVE mg/dL
Specific Gravity, Urine: 1.02 (ref 1.005–1.030)
Urobilinogen, UA: 0.2 mg/dL (ref 0.0–1.0)

## 2013-01-19 MED ORDER — HYDROXYPROGESTERONE CAPROATE 250 MG/ML IM OIL
250.0000 mg | TOPICAL_OIL | Freq: Once | INTRAMUSCULAR | Status: AC
  Administered 2013-01-19: 250 mg via INTRAMUSCULAR

## 2013-01-19 NOTE — Progress Notes (Signed)
Patient doing well without complaints. FM/PTL precautions reviewed. Continue daily labetalol. Continue weekly 17-P. F/U nephrology appointment on 4/9

## 2013-01-19 NOTE — Progress Notes (Signed)
Pulse- 80 

## 2013-01-26 ENCOUNTER — Ambulatory Visit (INDEPENDENT_AMBULATORY_CARE_PROVIDER_SITE_OTHER): Payer: Self-pay | Admitting: General Practice

## 2013-01-26 VITALS — BP 144/95 | HR 73 | Temp 97.6°F | Ht <= 58 in | Wt 97.1 lb

## 2013-01-26 DIAGNOSIS — O34219 Maternal care for unspecified type scar from previous cesarean delivery: Secondary | ICD-10-CM

## 2013-01-26 DIAGNOSIS — O09212 Supervision of pregnancy with history of pre-term labor, second trimester: Secondary | ICD-10-CM

## 2013-01-26 DIAGNOSIS — O09219 Supervision of pregnancy with history of pre-term labor, unspecified trimester: Secondary | ICD-10-CM

## 2013-01-26 NOTE — Progress Notes (Signed)
Discussed patient's high blood pressure with Dr. Kennon Rounds, Dr Kennon Rounds was fine for patient to go home since her blood pressures have been high anyway. Advised patient to seek care at [facility] should she start to feel worse

## 2013-02-02 ENCOUNTER — Ambulatory Visit (INDEPENDENT_AMBULATORY_CARE_PROVIDER_SITE_OTHER): Payer: Self-pay | Admitting: Obstetrics and Gynecology

## 2013-02-02 ENCOUNTER — Encounter: Payer: Self-pay | Admitting: Obstetrics and Gynecology

## 2013-02-02 ENCOUNTER — Other Ambulatory Visit: Payer: Self-pay | Admitting: Obstetrics and Gynecology

## 2013-02-02 VITALS — BP 146/99 | Temp 96.7°F | Wt 96.9 lb

## 2013-02-02 DIAGNOSIS — O10012 Pre-existing essential hypertension complicating pregnancy, second trimester: Secondary | ICD-10-CM

## 2013-02-02 DIAGNOSIS — O09219 Supervision of pregnancy with history of pre-term labor, unspecified trimester: Secondary | ICD-10-CM

## 2013-02-02 DIAGNOSIS — O10019 Pre-existing essential hypertension complicating pregnancy, unspecified trimester: Secondary | ICD-10-CM

## 2013-02-02 DIAGNOSIS — O09212 Supervision of pregnancy with history of pre-term labor, second trimester: Secondary | ICD-10-CM

## 2013-02-02 DIAGNOSIS — N059 Unspecified nephritic syndrome with unspecified morphologic changes: Secondary | ICD-10-CM

## 2013-02-02 LAB — POCT URINALYSIS DIP (DEVICE)
Bilirubin Urine: NEGATIVE
Glucose, UA: NEGATIVE mg/dL
Nitrite: NEGATIVE

## 2013-02-02 MED ORDER — LABETALOL HCL 200 MG PO TABS: 400.0000 mg | ORAL_TABLET | Freq: Two times a day (BID) | ORAL | Status: DC

## 2013-02-02 MED ORDER — HYDROXYPROGESTERONE CAPROATE 250 MG/ML IM OIL
250.0000 mg | TOPICAL_OIL | Freq: Once | INTRAMUSCULAR | Status: AC
  Administered 2013-02-02: 250 mg via INTRAMUSCULAR

## 2013-02-02 NOTE — Progress Notes (Signed)
Pulse 73 States that she took her labetalol at 0800.

## 2013-02-02 NOTE — Addendum Note (Signed)
Addended by: Michel Harrow on: 02/02/2013 01:51 PM   Modules accepted: Orders

## 2013-02-02 NOTE — Progress Notes (Signed)
Elevated BP today. Will increase labetalol to 400 mg BID. Patient has follow-up appointment with nephrologist on 5/16. 4/9 appointment was blood work only. FM/PTL precautions reviewed. Continue weekly 17-P

## 2013-02-09 ENCOUNTER — Encounter: Payer: Self-pay | Admitting: General Practice

## 2013-02-09 ENCOUNTER — Ambulatory Visit (INDEPENDENT_AMBULATORY_CARE_PROVIDER_SITE_OTHER): Payer: Self-pay | Admitting: General Practice

## 2013-02-09 VITALS — BP 135/97 | HR 73 | Temp 98.4°F | Ht <= 58 in | Wt 98.0 lb

## 2013-02-09 DIAGNOSIS — O09213 Supervision of pregnancy with history of pre-term labor, third trimester: Secondary | ICD-10-CM

## 2013-02-09 DIAGNOSIS — O09219 Supervision of pregnancy with history of pre-term labor, unspecified trimester: Secondary | ICD-10-CM

## 2013-02-09 MED ORDER — HYDROXYPROGESTERONE CAPROATE 250 MG/ML IM OIL
250.0000 mg | TOPICAL_OIL | Freq: Once | INTRAMUSCULAR | Status: AC
  Administered 2013-02-09: 250 mg via INTRAMUSCULAR

## 2013-02-10 ENCOUNTER — Ambulatory Visit (HOSPITAL_COMMUNITY)
Admission: RE | Admit: 2013-02-10 | Discharge: 2013-02-10 | Disposition: A | Discharge status: Home / Self Care | Payer: Self-pay | Source: Ambulatory Visit | Attending: Obstetrics & Gynecology | Admitting: Obstetrics & Gynecology

## 2013-02-10 VITALS — BP 135/100 | HR 74 | Wt 98.5 lb

## 2013-02-10 DIAGNOSIS — O10012 Pre-existing essential hypertension complicating pregnancy, second trimester: Secondary | ICD-10-CM

## 2013-02-10 DIAGNOSIS — Z8751 Personal history of pre-term labor: Secondary | ICD-10-CM | POA: Insufficient documentation

## 2013-02-10 DIAGNOSIS — N059 Unspecified nephritic syndrome with unspecified morphologic changes: Secondary | ICD-10-CM

## 2013-02-10 DIAGNOSIS — O10019 Pre-existing essential hypertension complicating pregnancy, unspecified trimester: Secondary | ICD-10-CM | POA: Insufficient documentation

## 2013-02-10 DIAGNOSIS — O34219 Maternal care for unspecified type scar from previous cesarean delivery: Secondary | ICD-10-CM | POA: Insufficient documentation

## 2013-02-10 DIAGNOSIS — O09219 Supervision of pregnancy with history of pre-term labor, unspecified trimester: Secondary | ICD-10-CM | POA: Insufficient documentation

## 2013-02-10 DIAGNOSIS — O09212 Supervision of pregnancy with history of pre-term labor, second trimester: Secondary | ICD-10-CM

## 2013-02-10 NOTE — Progress Notes (Signed)
Karla Garner  was seen today for an ultrasound appointment.  See full report in [facility].  Comments: [name] returns for follow up due to a history of chronic hypertension and renal insufficiency.  Her most recent serum Creatinine was 2.87 mg/dl and baseline 24-hr urine protein was 1.2 g/24 hrs.  She is currently on Labetolol 400 mg BID.   She is currently followed by Nephrology.  BPs in clnic today were 147/110; repeat 135/100  Impression: Single IUP at 29 3/7 weeks Interval growth is appropriate (28th %tile) Normal interval anatomy Normal amniotic fluid volume  Recommendations: Recommend follow-up ultrasound examination in 4 weeks for interval growth. Antepartum fetal testing beginning at [redacted] weeks gestation. May continue to titrate Labetolol upward to maintain BPs in 140's/90's range.  Benjaman Lobe, MD

## 2013-02-11 ENCOUNTER — Encounter: Payer: Self-pay | Admitting: *Deleted

## 2013-02-16 ENCOUNTER — Ambulatory Visit (INDEPENDENT_AMBULATORY_CARE_PROVIDER_SITE_OTHER): Payer: Self-pay | Admitting: Family

## 2013-02-16 VITALS — BP 157/103 | Temp 97.2°F | Wt 96.2 lb

## 2013-02-16 DIAGNOSIS — O09219 Supervision of pregnancy with history of pre-term labor, unspecified trimester: Secondary | ICD-10-CM

## 2013-02-16 DIAGNOSIS — O09212 Supervision of pregnancy with history of pre-term labor, second trimester: Secondary | ICD-10-CM

## 2013-02-16 LAB — POCT URINALYSIS DIP (DEVICE)
Bilirubin Urine: NEGATIVE
Ketones, ur: NEGATIVE mg/dL
Specific Gravity, Urine: 1.02 (ref 1.005–1.030)
pH: 6.5 (ref 5.0–8.0)

## 2013-02-16 LAB — CBC
HCT: 31.4 % — ABNORMAL LOW (ref 36.0–46.0)
Hemoglobin: 11 g/dL — ABNORMAL LOW (ref 12.0–15.0)
MCH: 28.4 pg (ref 26.0–34.0)
Platelets: 210 10*3/uL (ref 150–400)
RDW: 15 % (ref 11.5–15.5)

## 2013-02-16 MED ORDER — HYDROXYPROGESTERONE CAPROATE 250 MG/ML IM OIL
250.0000 mg | TOPICAL_OIL | Freq: Once | INTRAMUSCULAR | Status: AC
  Administered 2013-02-16: 250 mg via INTRAMUSCULAR

## 2013-02-16 NOTE — Progress Notes (Signed)
Patient does not complain of headaches or blurred vision.

## 2013-02-16 NOTE — Progress Notes (Signed)
[place] 4/22 28th percentile; appt with nephrologist 5/16; increase labetalol to 600 mg BID;  cmp and cbc today.  1 hr GCT obtained. Consulted with Dr. Harolyn Rutherford > agrees with medication change.  Obtain protein:creatinine ratio.

## 2013-02-17 LAB — COMPREHENSIVE METABOLIC PANEL
ALT: 16 U/L (ref 0–35)
AST: 23 U/L (ref 0–37)
CO2: 19 mEq/L (ref 19–32)
Chloride: 106 mEq/L (ref 96–112)
Sodium: 139 mEq/L (ref 135–145)
Total Bilirubin: 0.3 mg/dL (ref 0.3–1.2)
Total Protein: 6.9 g/dL (ref 6.0–8.3)

## 2013-02-17 LAB — GLUCOSE TOLERANCE, 1 HOUR (50G) W/O FASTING: Glucose, 1 Hour GTT: 65 mg/dL — ABNORMAL LOW (ref 70–140)

## 2013-02-20 ENCOUNTER — Encounter: Payer: Self-pay | Admitting: Family

## 2013-02-23 ENCOUNTER — Ambulatory Visit (INDEPENDENT_AMBULATORY_CARE_PROVIDER_SITE_OTHER): Payer: Self-pay | Admitting: *Deleted

## 2013-02-23 ENCOUNTER — Encounter (HOSPITAL_COMMUNITY): Payer: Self-pay

## 2013-02-23 ENCOUNTER — Inpatient Hospital Stay (HOSPITAL_COMMUNITY)
Admission: AD | Admit: 2013-02-23 | Discharge: 2013-02-24 | DRG: 781 | Disposition: A | Discharge status: Other Acute Inpatient Hospital | Payer: Medicaid Other | Source: Ambulatory Visit | Attending: Family Medicine | Admitting: Family Medicine

## 2013-02-23 VITALS — BP 158/101 | Temp 97.5°F | Wt 98.5 lb

## 2013-02-23 DIAGNOSIS — O10019 Pre-existing essential hypertension complicating pregnancy, unspecified trimester: Secondary | ICD-10-CM | POA: Diagnosis present

## 2013-02-23 DIAGNOSIS — O09213 Supervision of pregnancy with history of pre-term labor, third trimester: Secondary | ICD-10-CM

## 2013-02-23 DIAGNOSIS — O10419 Pre-existing secondary hypertension complicating pregnancy, unspecified trimester: Secondary | ICD-10-CM

## 2013-02-23 DIAGNOSIS — O10012 Pre-existing essential hypertension complicating pregnancy, second trimester: Secondary | ICD-10-CM

## 2013-02-23 DIAGNOSIS — I151 Hypertension secondary to other renal disorders: Principal | ICD-10-CM | POA: Diagnosis present

## 2013-02-23 DIAGNOSIS — N179 Acute kidney failure, unspecified: Secondary | ICD-10-CM | POA: Diagnosis present

## 2013-02-23 DIAGNOSIS — N059 Unspecified nephritic syndrome with unspecified morphologic changes: Secondary | ICD-10-CM

## 2013-02-23 DIAGNOSIS — O10913 Unspecified pre-existing hypertension complicating pregnancy, third trimester: Secondary | ICD-10-CM

## 2013-02-23 DIAGNOSIS — O09219 Supervision of pregnancy with history of pre-term labor, unspecified trimester: Secondary | ICD-10-CM

## 2013-02-23 DIAGNOSIS — I159 Secondary hypertension, unspecified: Secondary | ICD-10-CM

## 2013-02-23 DIAGNOSIS — N186 End stage renal disease: Secondary | ICD-10-CM | POA: Diagnosis present

## 2013-02-23 DIAGNOSIS — O34219 Maternal care for unspecified type scar from previous cesarean delivery: Secondary | ICD-10-CM | POA: Diagnosis present

## 2013-02-23 DIAGNOSIS — O09212 Supervision of pregnancy with history of pre-term labor, second trimester: Secondary | ICD-10-CM

## 2013-02-23 DIAGNOSIS — E875 Hyperkalemia: Secondary | ICD-10-CM | POA: Diagnosis present

## 2013-02-23 LAB — COMPREHENSIVE METABOLIC PANEL
Albumin: 2.7 g/dL — ABNORMAL LOW (ref 3.5–5.2)
BUN: 50 mg/dL — ABNORMAL HIGH (ref 6–23)
Calcium: 8.5 mg/dL (ref 8.4–10.5)
Creatinine, Ser: 3.86 mg/dL — ABNORMAL HIGH (ref 0.50–1.10)
GFR calc Af Amer: 18 mL/min — ABNORMAL LOW (ref >90)
Glucose, Bld: 83 mg/dL (ref 70–99)
Potassium: 5.5 mEq/L — ABNORMAL HIGH (ref 3.5–5.1)
Total Protein: 7.2 g/dL (ref 6.0–8.3)

## 2013-02-23 LAB — CBC
HCT: 31.1 % — ABNORMAL LOW (ref 36.0–46.0)
Hemoglobin: 10.5 g/dL — ABNORMAL LOW (ref 12.0–15.0)
MCH: 28.2 pg (ref 26.0–34.0)
MCHC: 33.8 g/dL (ref 30.0–36.0)
RDW: 13.8 % (ref 11.5–15.5)

## 2013-02-23 LAB — BASIC METABOLIC PANEL
CO2: 20 mEq/L (ref 19–32)
Calcium: 8.2 mg/dL — ABNORMAL LOW (ref 8.4–10.5)
Chloride: 102 mEq/L (ref 96–112)
Glucose, Bld: 122 mg/dL — ABNORMAL HIGH (ref 70–99)
Sodium: 134 mEq/L — ABNORMAL LOW (ref 135–145)

## 2013-02-23 LAB — GLUCOSE, CAPILLARY: Glucose-Capillary: 144 mg/dL — ABNORMAL HIGH (ref 70–99)

## 2013-02-23 LAB — MAGNESIUM: Magnesium: 2.4 mg/dL (ref 1.5–2.5)

## 2013-02-23 MED ORDER — LACTATED RINGERS IV BOLUS (SEPSIS)
500.0000 mL | Freq: Once | INTRAVENOUS | Status: AC
  Administered 2013-02-23: 15:00:00 via INTRAVENOUS

## 2013-02-23 MED ORDER — SODIUM CHLORIDE 0.9 % IJ SOLN: 3.0000 mL | INTRAMUSCULAR | Status: DC | PRN

## 2013-02-23 MED ORDER — LABETALOL HCL 5 MG/ML IV SOLN
20.0000 mg | INTRAVENOUS | Status: DC | PRN
  Administered 2013-02-23 (×2): 20 mg via INTRAVENOUS
  Filled 2013-02-23 (×2): qty 4

## 2013-02-23 MED ORDER — DOCUSATE SODIUM 100 MG PO CAPS
100.0000 mg | ORAL_CAPSULE | Freq: Three times a day (TID) | ORAL | Status: DC
  Administered 2013-02-24: 100 mg via ORAL
  Filled 2013-02-23: qty 1

## 2013-02-23 MED ORDER — PRENATAL MULTIVITAMIN CH
1.0000 | ORAL_TABLET | Freq: Every day | ORAL | Status: DC
  Filled 2013-02-23 (×2): qty 1

## 2013-02-23 MED ORDER — HYDROXYPROGESTERONE CAPROATE 250 MG/ML IM OIL
250.0000 mg | TOPICAL_OIL | Freq: Once | INTRAMUSCULAR | Status: AC
  Administered 2013-02-23: 250 mg via INTRAMUSCULAR

## 2013-02-23 MED ORDER — INSULIN REGULAR HUMAN 100 UNIT/ML IJ SOLN
10.0000 [IU] | Freq: Once | INTRAMUSCULAR | Status: AC
  Administered 2013-02-23: 10 [IU] via INTRAVENOUS
  Filled 2013-02-23 (×2): qty 0.1

## 2013-02-23 MED ORDER — CALCIUM CARBONATE ANTACID 500 MG PO CHEW
2.0000 | CHEWABLE_TABLET | ORAL | Status: DC | PRN
  Filled 2013-02-23: qty 2

## 2013-02-23 MED ORDER — ZOLPIDEM TARTRATE 5 MG PO TABS: 5.0000 mg | ORAL_TABLET | Freq: Every evening | ORAL | Status: DC | PRN

## 2013-02-23 MED ORDER — ONDANSETRON HCL 4 MG/2ML IJ SOLN
4.0000 mg | Freq: Four times a day (QID) | INTRAMUSCULAR | Status: DC | PRN
  Administered 2013-02-23: 4 mg via INTRAVENOUS
  Filled 2013-02-23: qty 2

## 2013-02-23 MED ORDER — BETAMETHASONE SOD PHOS & ACET 6 (3-3) MG/ML IJ SUSP
12.0000 mg | INTRAMUSCULAR | Status: AC
  Administered 2013-02-23 – 2013-02-24 (×2): 12 mg via INTRAMUSCULAR
  Filled 2013-02-23 (×2): qty 2

## 2013-02-23 MED ORDER — ACETAMINOPHEN 325 MG PO TABS
650.0000 mg | ORAL_TABLET | ORAL | Status: DC | PRN
  Administered 2013-02-23 – 2013-02-24 (×3): 650 mg via ORAL
  Filled 2013-02-23 (×3): qty 2

## 2013-02-23 MED ORDER — SODIUM CHLORIDE 0.9 % IV SOLN: 250.0000 mL | INTRAVENOUS | Status: DC | PRN

## 2013-02-23 MED ORDER — PROMETHAZINE HCL 25 MG/ML IJ SOLN: 12.5000 mg | Freq: Four times a day (QID) | INTRAMUSCULAR | Status: DC | PRN

## 2013-02-23 MED ORDER — LACTATED RINGERS IV SOLN
INTRAVENOUS | Status: DC
  Administered 2013-02-23: 13:00:00 via INTRAVENOUS
  Administered 2013-02-23: 20 mL/h via INTRAVENOUS

## 2013-02-23 MED ORDER — INSULIN REGULAR HUMAN 100 UNIT/ML IJ SOLN
10.0000 [IU] | Freq: Once | INTRAMUSCULAR | Status: DC
  Filled 2013-02-23 (×2): qty 0.1

## 2013-02-23 MED ORDER — ONDANSETRON 4 MG PO TBDP
4.0000 mg | ORAL_TABLET | Freq: Four times a day (QID) | ORAL | Status: DC | PRN
  Filled 2013-02-23: qty 1

## 2013-02-23 MED ORDER — DOCUSATE SODIUM 100 MG PO CAPS
100.0000 mg | ORAL_CAPSULE | Freq: Every day | ORAL | Status: DC
  Filled 2013-02-23 (×2): qty 1

## 2013-02-23 MED ORDER — SODIUM POLYSTYRENE SULFONATE 15 GM/60ML PO SUSP
30.0000 g | Freq: Once | ORAL | Status: AC
  Administered 2013-02-23: 30 g via ORAL
  Filled 2013-02-23: qty 120

## 2013-02-23 MED ORDER — LABETALOL HCL 300 MG PO TABS
600.0000 mg | ORAL_TABLET | Freq: Three times a day (TID) | ORAL | Status: DC
  Administered 2013-02-23 – 2013-02-24 (×3): 600 mg via ORAL
  Filled 2013-02-23 (×6): qty 2

## 2013-02-23 MED ORDER — SODIUM CHLORIDE 0.9 % IJ SOLN: 3.0000 mL | Freq: Two times a day (BID) | INTRAMUSCULAR | Status: DC

## 2013-02-23 MED ORDER — LABETALOL HCL 300 MG PO TABS: 600.0000 mg | ORAL_TABLET | Freq: Two times a day (BID) | ORAL | Status: DC

## 2013-02-23 MED ORDER — INSULIN REGULAR BOLUS VIA INFUSION: 10.0000 [IU] | Freq: Once | INTRAVENOUS | Status: DC

## 2013-02-23 MED ORDER — DEXTROSE 50 % IV SOLN
25.0000 g | Freq: Once | INTRAVENOUS | Status: AC
  Administered 2013-02-23: 25 g via INTRAVENOUS
  Filled 2013-02-23: qty 50

## 2013-02-23 NOTE — H&P (Signed)
Chart reviewed and agree with management and plan.  

## 2013-02-23 NOTE — Progress Notes (Signed)
Calls to AICU and house supervisor to plan the pt transfer of care to AICU.

## 2013-02-23 NOTE — H&P (Signed)
Karla Garner is a 27 y.o. [id number] at [redacted]w[redacted]d admitted for Chronic hypertension with severe range blood pressure and acute on chronic renal failure.   0 Fetal presentation is unsure.  History of Present Illness: Patient seen in clinic today for 17-p injection and was found to have elevated BP (158/101), headache and blurred vision. She is on 600 mg labetalol BID and took her dose this morning at 8 am. She has had a headache and blurred vision for about a week. Does have nausea/vomiting since early in pregnancy. Known hx of glomerulonephritis, last creatinine 2.74 a month ago and 3.74 a week ago.  No fever/chills.  Patient reports the fetal movement as active. Patient reports uterine contraction  activity as none, does have pressure for last 3-4 weeks. Patient reports  vaginal bleeding as none. Patient describes fluid per vagina as None.  Pregnancy history significant for c/section with first pregnancy for breech. Successful VBAC at 35 weeks with second pregnancy.   Patient Active Problem List   Diagnosis Date Noted  . Previous cesarean delivery affecting pregnancy, antepartum 01/07/2013  . Glomerulonephritis 01/07/2013  . Benign essential hypertension antepartum 01/07/2013  . High risk pregnancy due to history of preterm labor 01/07/2013   Past Medical History: Past Medical History  Diagnosis Date  . Glomerulonephritis     Past Surgical History: Past Surgical History  Procedure Laterality Date  . Cesarean section      2009    Obstetrical History: OB History   Grav Para Term Preterm Abortions TAB SAB Ect Mult Living   3 2 1 1      2        Social History: History   Social History  . Marital Status: Single    Spouse Name: N/A    Number of Children: N/A  . Years of Education: N/A   Social History Main Topics  . Smoking status: None  . Smokeless tobacco: None  . Alcohol Use: None  . Drug Use: None  . Sexually Active: None   Other Topics Concern  . None    Social History Narrative  . None    Family History: History reviewed. No pertinent family history.  Allergies: No Known Allergies  Facility-administered medications prior to admission  Medication Dose Route Frequency Provider Last Rate Last Dose  . influenza  inactive virus vaccine (FLUZONE/FLUARIX) injection 0.5 mL  0.5 mL Intramuscular Once Seabron Spates, CNM       Prescriptions prior to admission  Medication Sig Dispense Refill  . hydroxyprogesterone caproate (DELALUTIN) 250 mg/mL OIL Inject 250 mg into the muscle once a week. mondays      . labetalol (NORMODYNE) 200 MG tablet Take 600 mg by mouth 2 (two) times daily.      . Prenatal Vit-Fe Fumarate-FA (PRENATAL MULTIVITAMIN) TABS Take 1 tablet by mouth daily at 12 noon.        Review of Systems - Pertinent positives and negatives listed in HPI  Vitals:  BP 165/94  Pulse 66  Temp(Src) 98.2 F (36.8 C) (Oral)  Resp 16  Ht 4\' 10"  (1.473 m)  Wt 44.906 kg (99 lb)  BMI 20.7 kg/m2  LMP 07/31/2012  Filed Vitals:   02/23/13 1215 02/23/13 1230 02/23/13 1255 02/23/13 1305  BP: 174/107 178/107 174/101 165/94  Pulse: 67 67 64 66  Temp:      TempSrc:      Resp:   16 16  Height:      Weight:  Physical Examination: Gen:  WNWD, no distress HEENT:  NCAT, EOMI, conjunctiva normal CV:  RRR, no murmur Resp:  CTAB Abdomen: gravid and non-tender and fundal height  is size equals dates Pelvic Exam:normal external genitalia, normal vagina. No CMT Cervix: Evaluated by digital exam., Position: posterior, Dilation: 0cm/thick/high Extremities: extremities normal, atraumatic, no cyanosis or edema with DTRs 3+ bilaterally Membranes:intact Fetal Monitoring:Baseline: 130 bpm, Variability: Good {> 6 bpm), Accelerations: Reactive and Decelerations: Absent Labs:  Results for orders placed during the hospital encounter of 02/23/13 (from the past 24 hour(s))  CBC   Collection Time    02/23/13 11:50 AM      Result Value Range    WBC 8.2  4.0 - 10.5 K/uL   RBC 3.72 (*) 3.87 - 5.11 MIL/uL   Hemoglobin 10.5 (*) 12.0 - 15.0 g/dL   HCT 31.1 (*) 36.0 - 46.0 %   MCV 83.6  78.0 - 100.0 fL   MCH 28.2  26.0 - 34.0 pg   MCHC 33.8  30.0 - 36.0 g/dL   RDW 13.8  11.5 - 15.5 %   Platelets 172  150 - 400 K/uL  COMPREHENSIVE METABOLIC PANEL   Collection Time    02/23/13 11:50 AM      Result Value Range   Sodium 135  135 - 145 mEq/L   Potassium 5.5 (*) 3.5 - 5.1 mEq/L   Chloride 104  96 - 112 mEq/L   CO2 20  19 - 32 mEq/L   Glucose, Bld 83  70 - 99 mg/dL   BUN 50 (*) 6 - 23 mg/dL   Creatinine, Ser 3.86 (*) 0.50 - 1.10 mg/dL   Calcium 8.5  8.4 - 10.5 mg/dL   Total Protein 7.2  6.0 - 8.3 g/dL   Albumin 2.7 (*) 3.5 - 5.2 g/dL   AST 25  0 - 37 U/L   ALT 16  0 - 35 U/L   Alkaline Phosphatase 158 (*) 39 - 117 U/L   Total Bilirubin 0.2 (*) 0.3 - 1.2 mg/dL   GFR calc non Af Amer 15 (*) >90 mL/min   GFR calc Af Amer 18 (*) >90 mL/min    Imaging Studies: US Ob Follow Up  02/10/2013  OBSTETRICAL ULTRASOUND: This exam was performed within a [facility]. The OB US report was generated in the [facility] system, and faxed to the ordering physician.   This report is also available in [employer] and in the [employer]. See [facility] Obstetric US report.    . betamethasone acetate-betamethasone sodium phosphate  12 mg Intramuscular Q24H  . docusate sodium  100 mg Oral Daily  . prenatal multivitamin  1 tablet Oral Q1200  . sodium chloride  3 mL Intravenous Q12H   I have reviewed the patient's current medications.  ASSESSMENT: 27 y.o. [id number] at [redacted]w[redacted]d with  - Worsening chronic hypertension with headache and vision changes - acute on chronic renal failure - IUP at [redacted]w[redacted]d (by LMP), NST reassuring - Hyperkalemia at 5.5 meq  PLAN: - Admit to Antenatal - Monitor BP, increase labetalol to 600 TID, IV labetalol as needed. Consider adding Norvasc if still not controlled - 24 hour urine protein (previous  3/21 was 1274 mg) - Monitor electrolytes and Is and Ox closely. - Betamethasone x 2. Hold on Mag given renal insufficiency - Continuous fetal monitoring, sono for AFI - Last ultrasound for growth 4/22 - normal growth and AFI - Discussed with [facility] (Dr. Justin Mend) - call if worsening  renal function or needs dialysis  Martha Clan, MD

## 2013-02-23 NOTE — Progress Notes (Signed)
CRITICAL VALUE ALERT  Critical value received: Potassium 6.2  Date of notification:  02/23/13  Time of notification:  1830  Critical value read back:yes  Nurse who received alert: Kassie Mends, RN  MD notified (1st page):  Dr. Christoper Fabian  Time of first page:  1832  MD notified (2nd page):  Time of second page:  Responding MD:  Dr. Christoper Fabian  Time MD responded:  [phone]

## 2013-02-23 NOTE — Progress Notes (Signed)
24 urine started

## 2013-02-23 NOTE — MAU Note (Signed)
Patient sent from the [facility] for evaluation of elevated blood pressure.

## 2013-02-23 NOTE — Progress Notes (Signed)
Orders received from Dr. Christoper Fabian and resp called to get a stat EKG

## 2013-02-23 NOTE — Plan of Care (Signed)
Problem: Consults Goal: Birthing Suites Patient Information Press F2 to bring up selections list   Pt < [redacted] weeks EGA     

## 2013-02-23 NOTE — Progress Notes (Signed)
Pt for 17 P today but no further ob fu appointments scheduled. Per chart review seen last week , has glomerulonephritis, HTN, hx Ptl, to see nephrology 03/06/13. Labetolol increased to 600mg  po bid last week. Creatinine increased from 2.87 to 3.74 last week. . Discussed with Dr. Elly Modena - checked BP - found to be elevated at 158/101 today with c/o blurry vision, seeing spots of light, and feeling shaky last few days, per Dr. Elly Modena sent to MAU for evaluation. Report called to charge nurse in MAU.

## 2013-02-24 ENCOUNTER — Inpatient Hospital Stay (HOSPITAL_COMMUNITY): Payer: Medicaid Other

## 2013-02-24 DIAGNOSIS — E875 Hyperkalemia: Secondary | ICD-10-CM | POA: Diagnosis present

## 2013-02-24 LAB — RENAL FUNCTION PANEL
CO2: 18 mEq/L — ABNORMAL LOW (ref 19–32)
Calcium: 7.8 mg/dL — ABNORMAL LOW (ref 8.4–10.5)
Chloride: 101 mEq/L (ref 96–112)
GFR calc Af Amer: 17 mL/min — ABNORMAL LOW (ref >90)
GFR calc non Af Amer: 15 mL/min — ABNORMAL LOW (ref >90)
Glucose, Bld: 128 mg/dL — ABNORMAL HIGH (ref 70–99)
Potassium: 5.1 mEq/L (ref 3.5–5.1)
Sodium: 133 mEq/L — ABNORMAL LOW (ref 135–145)

## 2013-02-24 LAB — PROTEIN, URINE, 24 HOUR
Collection Interval-UPROT: 24 hours
Protein, 24H Urine: 2670 mg/d — ABNORMAL HIGH (ref 50–100)
Protein, Urine: 178 mg/dL
Urine Total Volume-UPROT: 1500 mL

## 2013-02-24 LAB — BASIC METABOLIC PANEL
BUN: 53 mg/dL — ABNORMAL HIGH (ref 6–23)
CO2: 19 mEq/L (ref 19–32)
Chloride: 101 mEq/L (ref 96–112)
GFR calc non Af Amer: 14 mL/min — ABNORMAL LOW (ref >90)
Glucose, Bld: 109 mg/dL — ABNORMAL HIGH (ref 70–99)
Potassium: 5.8 mEq/L — ABNORMAL HIGH (ref 3.5–5.1)
Sodium: 133 mEq/L — ABNORMAL LOW (ref 135–145)

## 2013-02-24 LAB — CBC
HCT: 29 % — ABNORMAL LOW (ref 36.0–46.0)
Hemoglobin: 10 g/dL — ABNORMAL LOW (ref 12.0–15.0)
MCHC: 34.5 g/dL (ref 30.0–36.0)
RBC: 3.46 MIL/uL — ABNORMAL LOW (ref 3.87–5.11)
WBC: 9 10*3/uL (ref 4.0–10.5)

## 2013-02-24 LAB — CREATININE CLEARANCE, URINE, 24 HOUR
Creatinine: 3.91 mg/dL — ABNORMAL HIGH (ref 0.50–1.10)
Urine Total Volume-CRCL: 1500 mL

## 2013-02-24 MED ORDER — SODIUM CHLORIDE 0.9 % IV BOLUS (SEPSIS)
500.0000 mL | Freq: Once | INTRAVENOUS | Status: AC
  Administered 2013-02-24: 500 mL via INTRAVENOUS

## 2013-02-24 MED ORDER — SODIUM CHLORIDE 0.9 % IV SOLN
INTRAVENOUS | Status: DC
  Administered 2013-02-24: 01:00:00 via INTRAVENOUS

## 2013-02-24 NOTE — Progress Notes (Signed)
Patient ID: Karla Garner, female   DOB: 1986/05/12, 27 y.o.   MRN: [id number]  [place] NOTE  Karla Garner is a 27 y.o. [id number] at [redacted]w[redacted]d  who is admitted for worsening Chronic Hypertension and Acute on Chronic Renal Failure.  Estimated Date of Delivery: 05/07/13 Fetal presentation is unsure.  Length of Stay:  1 Days. 02/23/2013  Subjective: Pt reports mild headache, improved, no vision changes. Hands/forearms feel swollen. Patient reports good fetal movement.  She reports no uterine contractions, no bleeding and no loss of fluid per vagina.  Vitals:  Blood pressure 146/87, pulse 70, temperature 97.8 F (36.6 C), temperature source Axillary, resp. rate 15, height 4\' 10"  (1.473 m), weight 44.906 kg (99 lb), last menstrual period 07/31/2012, SpO2 98.00%.  Filed Vitals:   02/24/13 0300 02/24/13 0400 02/24/13 0500 02/24/13 0600  BP: 153/98 139/88 142/90 146/87  Pulse: 72 77 85 70  Temp: 97.8 F (36.6 C)     TempSrc: Axillary     Resp: 13 13 15 15   Height:      Weight:      SpO2: 98% 98% 98% 98%    Physical Examination: GEN:  Alert, no distress HEENT:  NCAT, EOMI, conjunctiva clear CV:  RRR, no murmur RESP:  CTAB ABD:  Gravid, non-tender Cervical Exam: Not evaluated.  Extremities: extremities normal, atraumatic, no cyanosis. Mild upper extremity edema - hands stiff, no pitting. DTRs 3+ bilaterally Membranes:intact  Fetal Monitoring:  Baseline: 120 bpm, Variability: Good {> 6 bpm), Accelerations: Reactive and Decelerations: Absent  Labs:  Results for orders placed during the hospital encounter of 02/23/13 (from the past 24 hour(s))  CBC   Collection Time    02/23/13 11:50 AM      Result Value Range   WBC 8.2  4.0 - 10.5 K/uL   RBC 3.72 (*) 3.87 - 5.11 MIL/uL   Hemoglobin 10.5 (*) 12.0 - 15.0 g/dL   HCT 31.1 (*) 36.0 - 46.0 %   MCV 83.6  78.0 - 100.0 fL   MCH 28.2  26.0 - 34.0 pg   MCHC 33.8  30.0 - 36.0 g/dL   RDW  13.8  11.5 - 15.5 %   Platelets 172  150 - 400 K/uL  COMPREHENSIVE METABOLIC PANEL   Collection Time    02/23/13 11:50 AM      Result Value Range   Sodium 135  135 - 145 mEq/L   Potassium 5.5 (*) 3.5 - 5.1 mEq/L   Chloride 104  96 - 112 mEq/L   CO2 20  19 - 32 mEq/L   Glucose, Bld 83  70 - 99 mg/dL   BUN 50 (*) 6 - 23 mg/dL   Creatinine, Ser 3.86 (*) 0.50 - 1.10 mg/dL   Calcium 8.5  8.4 - 10.5 mg/dL   Total Protein 7.2  6.0 - 8.3 g/dL   Albumin 2.7 (*) 3.5 - 5.2 g/dL   AST 25  0 - 37 U/L   ALT 16  0 - 35 U/L   Alkaline Phosphatase 158 (*) 39 - 117 U/L   Total Bilirubin 0.2 (*) 0.3 - 1.2 mg/dL   GFR calc non Af Amer 15 (*) >90 mL/min   GFR calc Af Amer 18 (*) >90 mL/min  PHOSPHORUS   Collection Time    02/23/13 11:50 AM      Result Value Range   Phosphorus 4.1  2.3 - 4.6 mg/dL  MAGNESIUM   Collection Time    02/23/13  11:50 AM      Result Value Range   Magnesium 2.4  1.5 - 2.5 mg/dL  BASIC METABOLIC PANEL   Collection Time    02/23/13  5:40 PM      Result Value Range   Sodium 134 (*) 135 - 145 mEq/L   Potassium 6.2 (*) 3.5 - 5.1 mEq/L   Chloride 102  96 - 112 mEq/L   CO2 20  19 - 32 mEq/L   Glucose, Bld 122 (*) 70 - 99 mg/dL   BUN 49 (*) 6 - 23 mg/dL   Creatinine, Ser 3.79 (*) 0.50 - 1.10 mg/dL   Calcium 8.2 (*) 8.4 - 10.5 mg/dL   GFR calc non Af Amer 16 (*) >90 mL/min   GFR calc Af Amer 18 (*) >90 mL/min  MRSA PCR SCREENING   Collection Time    02/23/13  9:07 PM      Result Value Range   MRSA by PCR NEGATIVE  NEGATIVE  GLUCOSE, CAPILLARY   Collection Time    02/23/13  9:42 PM      Result Value Range   Glucose-Capillary 144 (*) 70 - 99 mg/dL  BASIC METABOLIC PANEL   Collection Time    02/23/13 11:30 PM      Result Value Range   Sodium 133 (*) 135 - 145 mEq/L   Potassium 5.8 (*) 3.5 - 5.1 mEq/L   Chloride 101  96 - 112 mEq/L   CO2 19  19 - 32 mEq/L   Glucose, Bld 109 (*) 70 - 99 mg/dL   BUN 53 (*) 6 - 23 mg/dL   Creatinine, Ser 4.09 (*) 0.50 - 1.10 mg/dL    Calcium 8.6  8.4 - 10.5 mg/dL   GFR calc non Af Amer 14 (*) >90 mL/min   GFR calc Af Amer 17 (*) >90 mL/min  RENAL FUNCTION PANEL   Collection Time    02/24/13  5:00 AM      Result Value Range   Sodium 133 (*) 135 - 145 mEq/L   Potassium 5.1  3.5 - 5.1 mEq/L   Chloride 101  96 - 112 mEq/L   CO2 18 (*) 19 - 32 mEq/L   Glucose, Bld 128 (*) 70 - 99 mg/dL   BUN 51 (*) 6 - 23 mg/dL   Creatinine, Ser 3.91 (*) 0.50 - 1.10 mg/dL   Calcium 7.8 (*) 8.4 - 10.5 mg/dL   Phosphorus 3.8  2.3 - 4.6 mg/dL   Albumin 2.4 (*) 3.5 - 5.2 g/dL   GFR calc non Af Amer 15 (*) >90 mL/min   GFR calc Af Amer 17 (*) >90 mL/min  CBC   Collection Time    02/24/13  5:00 AM      Result Value Range   WBC 9.0  4.0 - 10.5 K/uL   RBC 3.46 (*) 3.87 - 5.11 MIL/uL   Hemoglobin 10.0 (*) 12.0 - 15.0 g/dL   HCT 29.0 (*) 36.0 - 46.0 %   MCV 83.8  78.0 - 100.0 fL   MCH 28.9  26.0 - 34.0 pg   MCHC 34.5  30.0 - 36.0 g/dL   RDW 13.9  11.5 - 15.5 %   Platelets 174  150 - 400 K/uL    Imaging Studies:    None new   Medications:  Scheduled . betamethasone acetate-betamethasone sodium phosphate  12 mg Intramuscular Q24H  . docusate sodium  100 mg Oral Q8H  . labetalol  600 mg Oral TID  . prenatal  multivitamin  1 tablet Oral Q1200  . sodium chloride  3 mL Intravenous Q12H   I have reviewed the patient's current medications.  ASSESSMENT: Patient Active Problem List   Diagnosis Date Noted  . Acute on chronic renal failure 02/23/2013  . Previous cesarean delivery affecting pregnancy, antepartum 01/07/2013  . Glomerulonephritis 01/07/2013  . Benign essential hypertension antepartum 01/07/2013  . High risk pregnancy due to history of preterm labor 01/07/2013    PLAN: - FHTs reactive - BP controlled on PO labetalol at 600 mg TID - 24 hour urine finished at 4 pm, LFTs and platelets normal - Creatinine worse from admission 3.88 > 3.79 > 4.09 > 3.91 - [facility] consulted:  - Potassium 5.5 > 6.2 > 5.8 >  5.1.  S/p kayexcalate x  30 g x 1; insulin and glucose. Monitor, on telemetry overnight, EKG normal - BMZ #2 due at 1 pm - Consult MFM today - Continue routine antenatal care  Martha Clan, MD

## 2013-02-24 NOTE — Progress Notes (Signed)
UR chart review completed.  

## 2013-02-24 NOTE — Progress Notes (Signed)
Report given to Cyril Mourning (Camera operator on L&D at [facility]) and [place]).  Transferred to [facility] via [facility] @ 1640. FHR=130 upon discharge. Pt reports + FM with intermittent pelvic pressure. Pt denies any other complaints. Spanish interpreter at bedside.

## 2013-02-24 NOTE — Consult Note (Signed)
[place] [place] CONSULT  Patient Name: Karla Garner Medical Record Number:  [id number] Date of Birth: 10-Jan-1986 Requesting Physician Name:  Donnamae Jude, MD Date of Service: 02/24/2013  Chief Complaint Acute on chronic renal failure  History of Present Illness Karla Garner was seen today due to acute on chronic renal faliure at the request of Donnamae Jude, MD.  The patient is a 27 y.o. [id number] [redacted]w[redacted]d with an EDD of 05/07/2013, by Last Menstrual Period dating method.  She has known chronic renal failure, due to IgA nephropathy, originally diagnosed approximately 18 months ago.  She was found to be hypertensive during a recent OB visit and was admitted to the hospital.  Laboratory work up as part of admission noted a worsening of her creatinine and hyperkalemia (peak potassium of 6.1).  She also reported headache and visual changes at the time of admission.  The visual changes have resolved.  She continues to intermittent have a headache which is relieved with Tylenol.  Her BP has remained in the mild range since admission with an increase in labetalol to 600 mg po tid.  She also received a dose of kaexylate which improved her most recent potassium to 5.1.  She denies, vaginal bleeding, loss of fluid, or contractions.  Fetal movement is normal.  Review of Systems Pertinent items are noted in HPI.  Patient History OB History   Grav Para Term Preterm Abortions TAB SAB Ect Mult Living   3 2 1 1      2      # Outc Date GA Lbr Len/2nd Wgt Sex Del Anes PTL Lv   1 TRM 2/09 [redacted]w[redacted]d  5lb7oz(2.466kg) M CS EPI  Yes   2 PRE 11/11 [redacted]w[redacted]d  4lb4oz(1.928kg) M SVD None Yes Yes   3 CUR               Past Medical History  Diagnosis Date  . Glomerulonephritis     Past Surgical History  Procedure Laterality Date  . Cesarean section      2009    History   Social History  . Marital Status: Single    Spouse Name: N/A    Number of Children: N/A  . Years of Education: N/A    Social History Main Topics  . Smoking status: Never Smoker   . Smokeless tobacco: Never Used  . Alcohol Use: No  . Drug Use: No  . Sexually Active: Yes   Other Topics Concern  . None   Social History Narrative  . None    Physical Examination Filed Vitals:   02/24/13 1200  BP: 144/85  Pulse: 80  Temp: 98.6 F (37 C)  Resp: 16   General appearance - alert, well appearing, and in no distress Abdomen - soft, nontender, nondistended, no masses or organomegaly Extremities - pedal edema 2 +  Results for orders placed during the hospital encounter of 02/23/13 (from the past 48 hour(s))  CBC     Status: Abnormal   Collection Time    02/23/13 11:50 AM      Result Value Range   WBC 8.2  4.0 - 10.5 K/uL   RBC 3.72 (*) 3.87 - 5.11 MIL/uL   Hemoglobin 10.5 (*) 12.0 - 15.0 g/dL   HCT 31.1 (*) 36.0 - 46.0 %   MCV 83.6  78.0 - 100.0 fL   MCH 28.2  26.0 - 34.0 pg   MCHC 33.8  30.0 - 36.0 g/dL   RDW 13.8  11.5 -  15.5 %   Platelets 172  150 - 400 K/uL  COMPREHENSIVE METABOLIC PANEL     Status: Abnormal   Collection Time    02/23/13 11:50 AM      Result Value Range   Sodium 135  135 - 145 mEq/L   Potassium 5.5 (*) 3.5 - 5.1 mEq/L   Chloride 104  96 - 112 mEq/L   CO2 20  19 - 32 mEq/L   Glucose, Bld 83  70 - 99 mg/dL   BUN 50 (*) 6 - 23 mg/dL   Creatinine, Ser 3.86 (*) 0.50 - 1.10 mg/dL   Calcium 8.5  8.4 - 10.5 mg/dL   Total Protein 7.2  6.0 - 8.3 g/dL   Albumin 2.7 (*) 3.5 - 5.2 g/dL   AST 25  0 - 37 U/L   ALT 16  0 - 35 U/L   Alkaline Phosphatase 158 (*) 39 - 117 U/L   Total Bilirubin 0.2 (*) 0.3 - 1.2 mg/dL   GFR calc non Af Amer 15 (*) >90 mL/min   GFR calc Af Amer 18 (*) >90 mL/min   Comment:            The eGFR has been calculated     using the CKD EPI equation.     This calculation has not been     validated in all clinical     situations.     eGFR's persistently     <90 mL/min signify     possible Chronic Kidney Disease.  PHOSPHORUS     Status: None    Collection Time    02/23/13 11:50 AM      Result Value Range   Phosphorus 4.1  2.3 - 4.6 mg/dL  MAGNESIUM     Status: None   Collection Time    02/23/13 11:50 AM      Result Value Range   Magnesium 2.4  1.5 - 2.5 mg/dL  BASIC METABOLIC PANEL     Status: Abnormal   Collection Time    02/23/13  5:40 PM      Result Value Range   Sodium 134 (*) 135 - 145 mEq/L   Potassium 6.2 (*) 3.5 - 5.1 mEq/L   Comment: REPEATED TO VERIFY   Chloride 102  96 - 112 mEq/L   CO2 20  19 - 32 mEq/L   Glucose, Bld 122 (*) 70 - 99 mg/dL   BUN 49 (*) 6 - 23 mg/dL   Creatinine, Ser 3.79 (*) 0.50 - 1.10 mg/dL   Calcium 8.2 (*) 8.4 - 10.5 mg/dL   GFR calc non Af Amer 16 (*) >90 mL/min   GFR calc Af Amer 18 (*) >90 mL/min   Comment:            The eGFR has been calculated     using the CKD EPI equation.     This calculation has not been     validated in all clinical     situations.     eGFR's persistently     <90 mL/min signify     possible Chronic Kidney Disease.  MRSA PCR SCREENING     Status: None   Collection Time    02/23/13  9:07 PM      Result Value Range   MRSA by PCR NEGATIVE  NEGATIVE   Comment:            The GeneXpert MRSA Assay (FDA     approved for NASAL specimens  only), is one component of a     comprehensive MRSA colonization     surveillance program. It is not     intended to diagnose MRSA     infection nor to guide or     monitor treatment for     MRSA infections.  GLUCOSE, CAPILLARY     Status: Abnormal   Collection Time    02/23/13  9:42 PM      Result Value Range   Glucose-Capillary 144 (*) 70 - 99 mg/dL  BASIC METABOLIC PANEL     Status: Abnormal   Collection Time    02/23/13 11:30 PM      Result Value Range   Sodium 133 (*) 135 - 145 mEq/L   Potassium 5.8 (*) 3.5 - 5.1 mEq/L   Comment: NO VISIBLE HEMOLYSIS   Chloride 101  96 - 112 mEq/L   CO2 19  19 - 32 mEq/L   Glucose, Bld 109 (*) 70 - 99 mg/dL   BUN 53 (*) 6 - 23 mg/dL   Creatinine, Ser 4.09 (*) 0.50 -  1.10 mg/dL   Calcium 8.6  8.4 - 10.5 mg/dL   GFR calc non Af Amer 14 (*) >90 mL/min   GFR calc Af Amer 17 (*) >90 mL/min   Comment:            The eGFR has been calculated     using the CKD EPI equation.     This calculation has not been     validated in all clinical     situations.     eGFR's persistently     <90 mL/min signify     possible Chronic Kidney Disease.  RENAL FUNCTION PANEL     Status: Abnormal   Collection Time    02/24/13  5:00 AM      Result Value Range   Sodium 133 (*) 135 - 145 mEq/L   Potassium 5.1  3.5 - 5.1 mEq/L   Comment: NO VISIBLE HEMOLYSIS   Chloride 101  96 - 112 mEq/L   CO2 18 (*) 19 - 32 mEq/L   Glucose, Bld 128 (*) 70 - 99 mg/dL   BUN 51 (*) 6 - 23 mg/dL   Creatinine, Ser 3.91 (*) 0.50 - 1.10 mg/dL   Calcium 7.8 (*) 8.4 - 10.5 mg/dL   Phosphorus 3.8  2.3 - 4.6 mg/dL   Albumin 2.4 (*) 3.5 - 5.2 g/dL   GFR calc non Af Amer 15 (*) >90 mL/min   GFR calc Af Amer 17 (*) >90 mL/min   Comment:            The eGFR has been calculated     using the CKD EPI equation.     This calculation has not been     validated in all clinical     situations.     eGFR's persistently     <90 mL/min signify     possible Chronic Kidney Disease.  CBC     Status: Abnormal   Collection Time    02/24/13  5:00 AM      Result Value Range   WBC 9.0  4.0 - 10.5 K/uL   RBC 3.46 (*) 3.87 - 5.11 MIL/uL   Hemoglobin 10.0 (*) 12.0 - 15.0 g/dL   HCT 29.0 (*) 36.0 - 46.0 %   MCV 83.8  78.0 - 100.0 fL   MCH 28.9  26.0 - 34.0 pg   MCHC 34.5  30.0 - 36.0 g/dL  RDW 13.9  11.5 - 15.5 %   Platelets 174  150 - 400 K/uL     Assessment and Recommendations 1.  Acute on chronic renal failure.  Ms. Melburn Hake Dominguez's renal function has clearly worsened over the course of pregnancy.  Her potassium, creatinine, and BUN are now 5.1, 3.9, and 53 respectively.  I have spoken with her nephrologist Dr. Justin Mend, who believes that with the stabilization of her blood pressure and serum potassium  she has no acute indication for dialysis at this time, although he suspects it may be needed before the end of pregnancy.  The elevated BUN is of particular concern as azotemia is associated with preterm labor, IUFD, and stillbirth.  Thus, she may need dialysis earlier than she would outside of pregnancy.  She will require daily laboratory assessments to determine the need for dialysis.  I have spoken with Dr. Harolyn Rutherford from the faculty practice about Ms. Flores Dominguez's situation.  As dialysis cannot be performed at [place], she feels that Ms. Coraline Beechler will need to be transferred to [facility] for further care, and I am in agreement.  Arrangements are being made for the transfer.   Jolyn Lent, MD

## 2013-02-24 NOTE — Plan of Care (Signed)
Problem: Consults Goal: Birthing Suites Patient Information Press F2 to bring up selections list  Outcome: Not Applicable Date Met:  [id number]  Non-English Speaking Goal: NICU Tour Outcome: Not Met (add Reason) Pt transferred to [facility] prior to NICU tour.     Problem: Discharge Progression Outcomes Goal: Discharge plan in place and appropriate Outcome: Not Applicable Date Met:  [id number] Pt is to be transfer to [facility]

## 2013-02-24 NOTE — Discharge Summary (Signed)
Physician Transfer Summary  Patient ID: Karla Garner MRN: [id number] DOB/AGE: 1985/12/21 27 y.o.  Admit date: 02/23/2013 Discharge date: 02/24/2013  Admission Diagnoses: Intrauterine pregnancy at [redacted]w[redacted]d; acute on chronic renal failure, chronic hypertension, concern about superimposed preeclampsia  Discharge Diagnoses: Intrauterine pregnancy at [redacted]w[redacted]d; same as above and also refer to problem list below  Active Problems: Acute on chronic renal failure Hyperkalemia Glomerulonephritis Benign essential hypertension antepartum Previous cesarean delivery affecting pregnancy, antepartum High risk pregnancy due to history of preterm labor  Discharged Condition: Stable  Hospital Course: Patient is a 27 y.o. [id number] [redacted]w[redacted]d with an EDD of 05/07/2013 by LMP. 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 She also has chronic hypertension as a result of her renal disease, and she was treated with Labetalol that was steadily increased to control her blood pressures.  On 02/23/13, she was seen in clinic for her prenatal visit and noted to have severe range blood pressures, headache and blurry vision.  She was admitted to the hospital.  Laboratory work up as part of admission noted a worsening of her creatinine to 3.86 and hyperkalemia of 5.5.  She was treated with additonal Labetalol and her dose was increased to 600mg  po tid which controlled her blood pressure.  Her headaches were controlled with Tylenol and her vision changes resolved.  Subsequent labs showed a peak creatinine of 4.09 and peak potassium of 6.1.  She received kayexalate 30g x 1, insulin and glucose; her potassium at time of discharge was 5.1 and her creatinine was 3.91.  No cardiac arrhthymias were noted on telemetry.  Consults were asked of the Nephrology service (Dr. Justin Mend)  and Maternal-Fetal Medicine service (Dr. Nelda Severe).  As per Dr. Nelda Severe, after discussion with Dr. Justin Mend,  she has no acute indication for dialysis but will likely need this soon.   Dr. Nelda Severe was concerned that her elevated BUN/azotemia is associated with preterm labor, IUFD, and stillbirth. This may make her need dialysis earlier than she would outside of pregnancy.   He also felt she will require daily laboratory assessments to determine the need for dialysis.  As dialysis cannot be performed at [facility], he agreed that the patient will need to be transferred to [facility] for further care.  Patient denied vaginal bleeding, loss of fluid, or contractions and reported normal fetal movement throughout her admission.  She has a reactive fetal heart rate tracing and has been on continuous monitoring.   She is Spanish speaking only and has needed interpreters for every encounter.  Consults: Nephrology (Dr. Justin Mend)  and Maternal-Fetal Medicine (Dr. Nelda Severe)  Treatments: As above  Significant Diagnostic Studies: Results for orders placed during the hospital encounter of 02/23/13 (from the past 72 hour(s))  CBC     Status: Abnormal   Collection Time    02/23/13 11:50 AM      Result Value Range   WBC 8.2  4.0 - 10.5 K/uL   RBC 3.72 (*) 3.87 - 5.11 MIL/uL   Hemoglobin 10.5 (*) 12.0 - 15.0 g/dL   HCT 31.1 (*) 36.0 - 46.0 %   MCV 83.6  78.0 - 100.0 fL   MCH 28.2  26.0 - 34.0 pg   MCHC 33.8  30.0 - 36.0 g/dL   RDW 13.8  11.5 - 15.5 %   Platelets 172  150 - 400 K/uL  COMPREHENSIVE METABOLIC PANEL     Status: Abnormal   Collection Time  02/23/13 11:50 AM      Result Value Range   Sodium 135  135 - 145 mEq/L   Potassium 5.5 (*) 3.5 - 5.1 mEq/L   Chloride 104  96 - 112 mEq/L   CO2 20  19 - 32 mEq/L   Glucose, Bld 83  70 - 99 mg/dL   BUN 50 (*) 6 - 23 mg/dL   Creatinine, Ser 3.86 (*) 0.50 - 1.10 mg/dL   Calcium 8.5  8.4 - 10.5 mg/dL   Total Protein 7.2  6.0 - 8.3 g/dL   Albumin 2.7 (*) 3.5 - 5.2 g/dL   AST 25  0 - 37 U/L   ALT 16  0 - 35 U/L   Alkaline  Phosphatase 158 (*) 39 - 117 U/L   Total Bilirubin 0.2 (*) 0.3 - 1.2 mg/dL   GFR calc non Af Amer 15 (*) >90 mL/min   GFR calc Af Amer 18 (*) >90 mL/min   Comment:            The eGFR has been calculated     using the CKD EPI equation.     This calculation has not been     validated in all clinical     situations.     eGFR's persistently     <90 mL/min signify     possible Chronic Kidney Disease.  PHOSPHORUS     Status: None   Collection Time    02/23/13 11:50 AM      Result Value Range   Phosphorus 4.1  2.3 - 4.6 mg/dL  MAGNESIUM     Status: None   Collection Time    02/23/13 11:50 AM      Result Value Range   Magnesium 2.4  1.5 - 2.5 mg/dL  BASIC METABOLIC PANEL     Status: Abnormal   Collection Time    02/23/13  5:40 PM      Result Value Range   Sodium 134 (*) 135 - 145 mEq/L   Potassium 6.2 (*) 3.5 - 5.1 mEq/L   Comment: REPEATED TO VERIFY   Chloride 102  96 - 112 mEq/L   CO2 20  19 - 32 mEq/L   Glucose, Bld 122 (*) 70 - 99 mg/dL   BUN 49 (*) 6 - 23 mg/dL   Creatinine, Ser 3.79 (*) 0.50 - 1.10 mg/dL   Calcium 8.2 (*) 8.4 - 10.5 mg/dL   GFR calc non Af Amer 16 (*) >90 mL/min   GFR calc Af Amer 18 (*) >90 mL/min   Comment:            The eGFR has been calculated     using the CKD EPI equation.     This calculation has not been     validated in all clinical     situations.     eGFR's persistently     <90 mL/min signify     possible Chronic Kidney Disease.  MRSA PCR SCREENING     Status: None   Collection Time    02/23/13  9:07 PM      Result Value Range   MRSA by PCR NEGATIVE  NEGATIVE   Comment:            The GeneXpert MRSA Assay (FDA     approved for NASAL specimens     only), is one component of a     comprehensive MRSA colonization     surveillance program. It is not  intended to diagnose MRSA     infection nor to guide or     monitor treatment for     MRSA infections.  GLUCOSE, CAPILLARY     Status: Abnormal   Collection Time    02/23/13  9:42  PM      Result Value Range   Glucose-Capillary 144 (*) 70 - 99 mg/dL  BASIC METABOLIC PANEL     Status: Abnormal   Collection Time    02/23/13 11:30 PM      Result Value Range   Sodium 133 (*) 135 - 145 mEq/L   Potassium 5.8 (*) 3.5 - 5.1 mEq/L   Comment: NO VISIBLE HEMOLYSIS   Chloride 101  96 - 112 mEq/L   CO2 19  19 - 32 mEq/L   Glucose, Bld 109 (*) 70 - 99 mg/dL   BUN 53 (*) 6 - 23 mg/dL   Creatinine, Ser 4.09 (*) 0.50 - 1.10 mg/dL   Calcium 8.6  8.4 - 10.5 mg/dL   GFR calc non Af Amer 14 (*) >90 mL/min   GFR calc Af Amer 17 (*) >90 mL/min   Comment:            The eGFR has been calculated     using the CKD EPI equation.     This calculation has not been     validated in all clinical     situations.     eGFR's persistently     <90 mL/min signify     possible Chronic Kidney Disease.  RENAL FUNCTION PANEL     Status: Abnormal   Collection Time    02/24/13  5:00 AM      Result Value Range   Sodium 133 (*) 135 - 145 mEq/L   Potassium 5.1  3.5 - 5.1 mEq/L   Comment: NO VISIBLE HEMOLYSIS   Chloride 101  96 - 112 mEq/L   CO2 18 (*) 19 - 32 mEq/L   Glucose, Bld 128 (*) 70 - 99 mg/dL   BUN 51 (*) 6 - 23 mg/dL   Creatinine, Ser 3.91 (*) 0.50 - 1.10 mg/dL   Calcium 7.8 (*) 8.4 - 10.5 mg/dL   Phosphorus 3.8  2.3 - 4.6 mg/dL   Albumin 2.4 (*) 3.5 - 5.2 g/dL   GFR calc non Af Amer 15 (*) >90 mL/min   GFR calc Af Amer 17 (*) >90 mL/min   Comment:            The eGFR has been calculated     using the CKD EPI equation.     This calculation has not been     validated in all clinical     situations.     eGFR's persistently     <90 mL/min signify     possible Chronic Kidney Disease.  CBC     Status: Abnormal   Collection Time    02/24/13  5:00 AM      Result Value Range   WBC 9.0  4.0 - 10.5 K/uL   RBC 3.46 (*) 3.87 - 5.11 MIL/uL   Hemoglobin 10.0 (*) 12.0 - 15.0 g/dL   HCT 29.0 (*) 36.0 - 46.0 %   MCV 83.8  78.0 - 100.0 fL   MCH 28.9  26.0 - 34.0 pg   MCHC 34.5  30.0 -  36.0 g/dL   RDW 13.9  11.5 - 15.5 %   Platelets 174  150 - 400 K/uL    Disposition: Transfer to [facility]; accepting physician is Dr. Regina Eck  Verita Schneiders, MD, [place] Attending [place], [place]

## 2013-03-06 ENCOUNTER — Encounter: Payer: Self-pay | Admitting: Family Medicine

## 2013-03-09 ENCOUNTER — Encounter: Payer: Self-pay | Admitting: *Deleted

## 2013-03-10 ENCOUNTER — Ambulatory Visit (HOSPITAL_COMMUNITY): Admission: RE | Admit: 2013-03-10 | Payer: MEDICAID | Source: Ambulatory Visit

## 2013-04-30 DIAGNOSIS — I10 Essential (primary) hypertension: Secondary | ICD-10-CM | POA: Insufficient documentation

## 2013-04-30 DIAGNOSIS — N02B9 Other recurrent and persistent immunoglobulin A nephropathy: Secondary | ICD-10-CM | POA: Insufficient documentation

## 2013-04-30 DIAGNOSIS — N028 Recurrent and persistent hematuria with other morphologic changes: Secondary | ICD-10-CM | POA: Insufficient documentation

## 2013-07-06 ENCOUNTER — Other Ambulatory Visit: Payer: Self-pay | Admitting: *Deleted

## 2013-07-14 ENCOUNTER — Ambulatory Visit (HOSPITAL_COMMUNITY)
Admission: RE | Admit: 2013-07-14 | Discharge: 2013-07-14 | Disposition: A | Discharge status: Home / Self Care | Payer: Medicaid Other | Source: Ambulatory Visit | Attending: Surgery | Admitting: Surgery

## 2013-07-14 DIAGNOSIS — N059 Unspecified nephritic syndrome with unspecified morphologic changes: Secondary | ICD-10-CM | POA: Insufficient documentation

## 2013-07-14 DIAGNOSIS — N186 End stage renal disease: Secondary | ICD-10-CM

## 2013-07-14 DIAGNOSIS — Z452 Encounter for adjustment and management of vascular access device: Secondary | ICD-10-CM | POA: Insufficient documentation

## 2013-07-14 DIAGNOSIS — Z23 Encounter for immunization: Secondary | ICD-10-CM | POA: Insufficient documentation

## 2013-07-14 DIAGNOSIS — Z79899 Other long term (current) drug therapy: Secondary | ICD-10-CM | POA: Insufficient documentation

## 2013-07-14 NOTE — Progress Notes (Signed)
[facility] SHORT STAY H&P  CC: ESRD   HPI: Donshay Wible is a 27 y.o. female who has been on HD through  functioning Hemodialysis access in the left upper extremity. They are here for HD catheter removal. Pt. denies signs of steal syndrome.  Past Medical History  Diagnosis Date  . Glomerulonephritis     Family Hx No family history on file.  Social HX History  Substance Use Topics  . Smoking status: Never Smoker   . Smokeless tobacco: Never Used  . Alcohol Use: No    Allergies No Known Allergies  Medications Current Outpatient Prescriptions  Medication Sig Dispense Refill  . hydroxyprogesterone caproate (DELALUTIN) 250 mg/mL OIL Inject 250 mg into the muscle once a week. mondays      . Prenatal Vit-Fe Fumarate-FA (PRENATAL MULTIVITAMIN) TABS Take 1 tablet by mouth daily at 12 noon.       Current Facility-Administered Medications  Medication Dose Route Frequency Provider Last Rate Last Dose  . influenza  inactive virus vaccine (FLUZONE/FLUARIX) injection 0.5 mL  0.5 mL Intramuscular Once Seabron Spates, CNM        Labs COAG Lab Results  Component Value Date   INR 0.83 04/03/2011   No results found for this basename: PTT    PHYSICAL EXAM  Filed Vitals:   07/14/13 1233  BP: 130/82  Pulse: 79  Temp: 98.5 F (36.9 C)  Resp: 18    General:  WDWN in NAD HENT: WNL Eyes: Pupils equal Pulmonary: normal non-labored breathing  Cardiac: RRR, Skin: normal, no cyanosis, jaundice, pallor or bruising Vascular Exam/Pulses: 2+  pulses in LEFT upper extremity. Extremities without ischemic changes, no Gangrene , no cellulitis; no open wounds;   There is a good thrill palpable Hand grip is 5/5 and sensation in digits is intact;   Impression: This is a 27 y.o. female who has a functioning HD access.  Plan: Removal of Right IJ HD catheter Nan Maya [facility] 07/14/2013 2:54 PM   [facility] Catheter Removal Procedure  Note  Diagnosis: ESRD with Functioning AVF/AVGG  Plan:  Remove right diatek catheter  Consent signed:  yes Time out completed:  yes Coumadin:  no PT/INR (if applicable):   Other labs:   Procedure: 1.  Sterile prepping and draping over catheter area 2. 7 ml 2% lidocaine plain instilled at removal site. 3.  right catheter removed in its entirety with cuff in tact. 4.  Complications: none  5. Tip of catheter sent for culture:  no   Patient tolerated procedure well:  yes Pressure held, no bleeding noted, dressing applied Instructions given to the pt regarding wound care and bleeding.  OtherLaurence Slate [facility] 07/14/2013 2:54 PM

## 2014-07-27 IMAGING — US US OB FOLLOW-UP
1 series · 12 of 28 positions shown · non-contrast
Comparison: none

[Series 1: us ob follow-up · 0.27mm/px · 41 acquisitions, 12 frames shown]
[im 2/41]
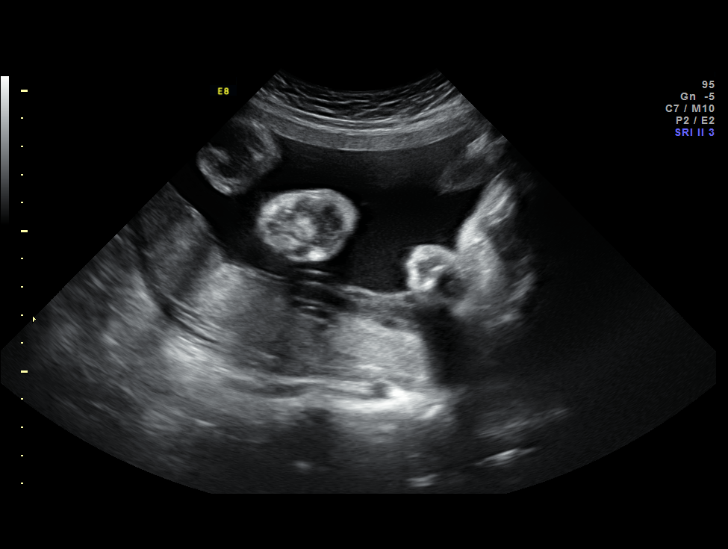
[im 5/41]
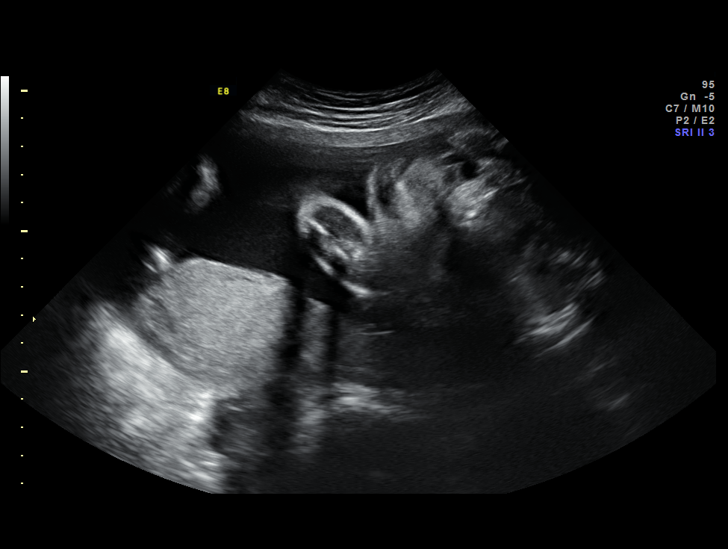
[im 8/41]
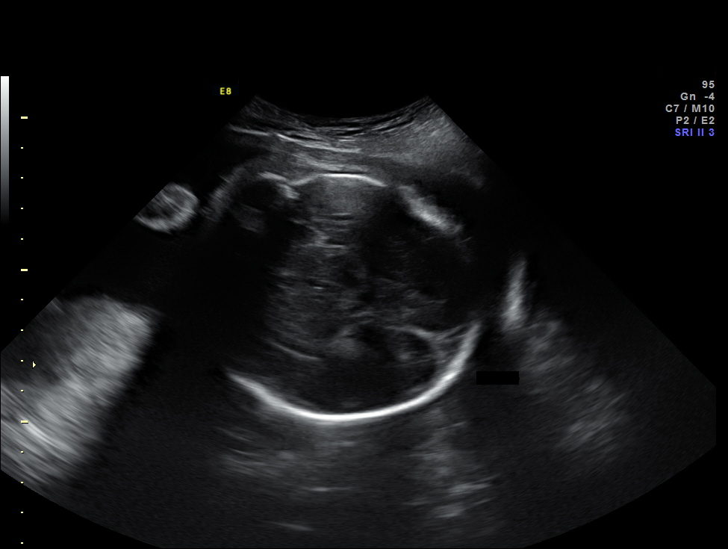
[im 12/41]
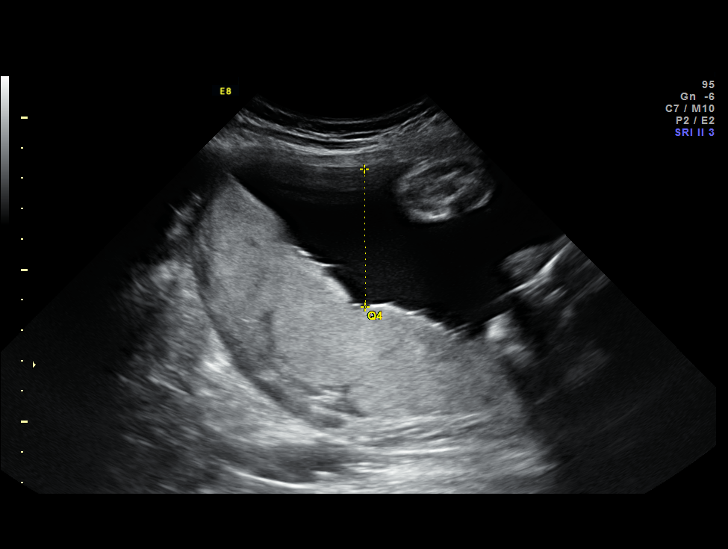
[im 15/41]
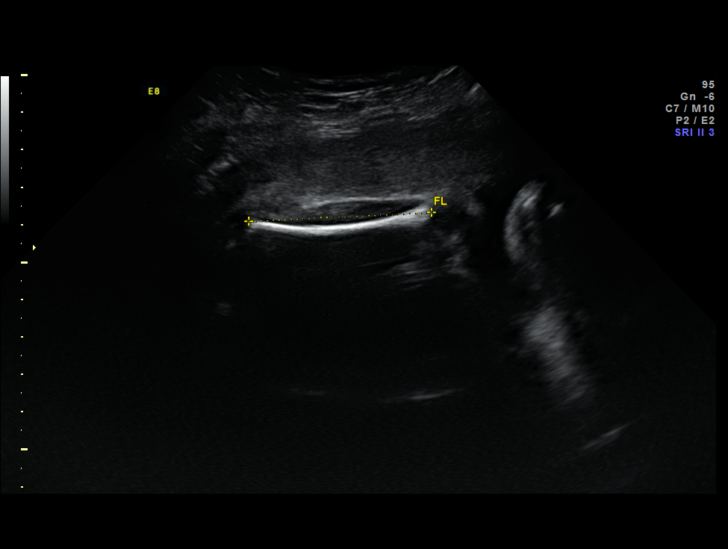
[im 18/41]
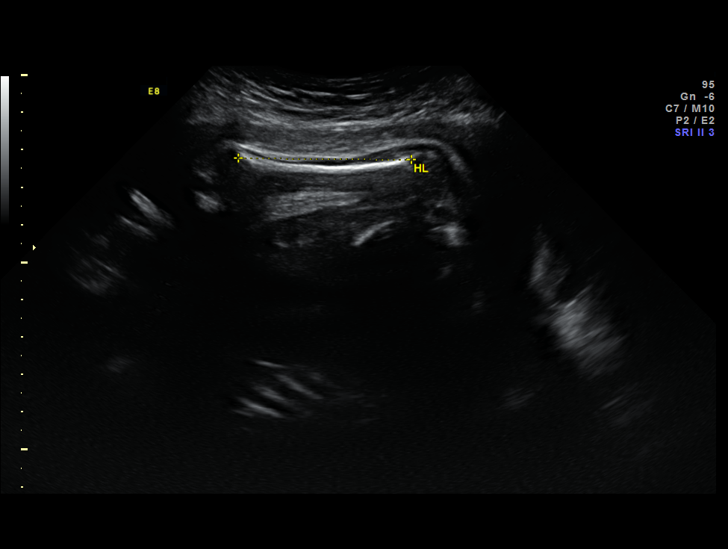
[im 23/41]
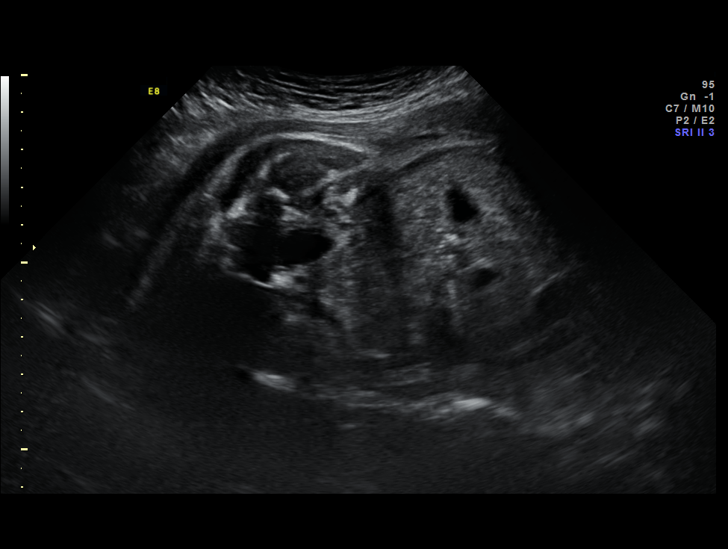
[im 26/41]
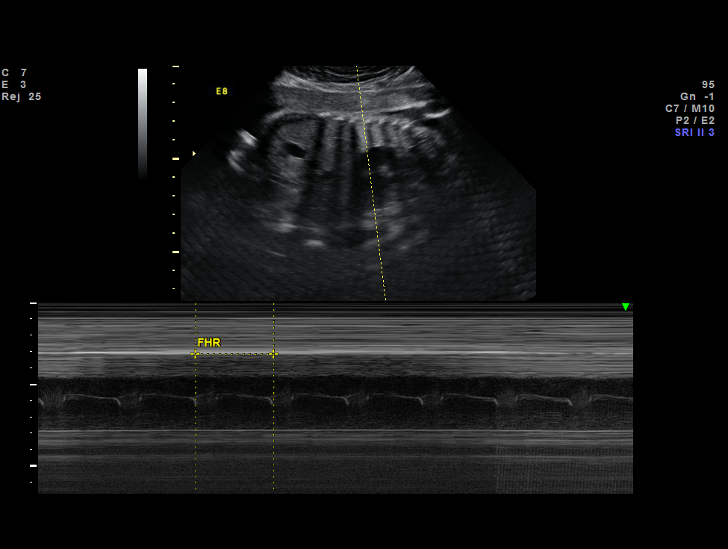
[im 29/41]
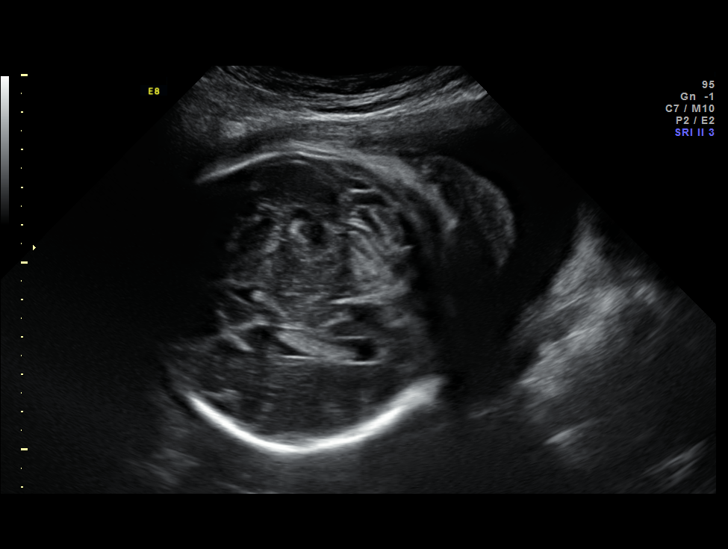
[im 33/41]
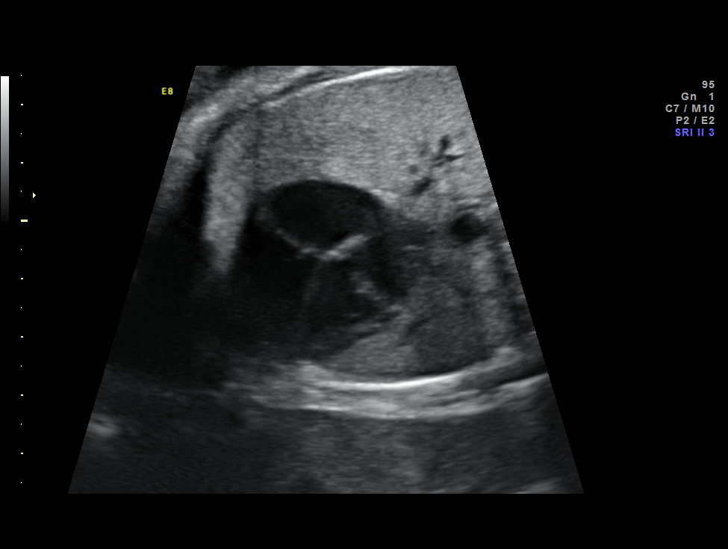
[im 36/41]
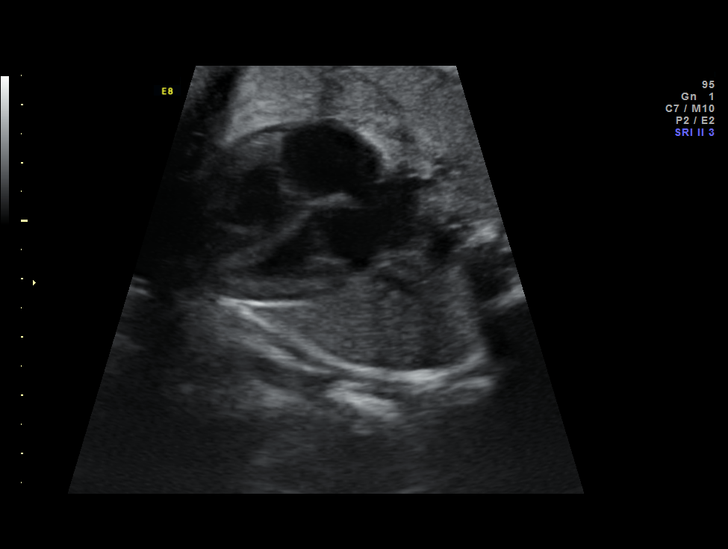
[im 39/41]
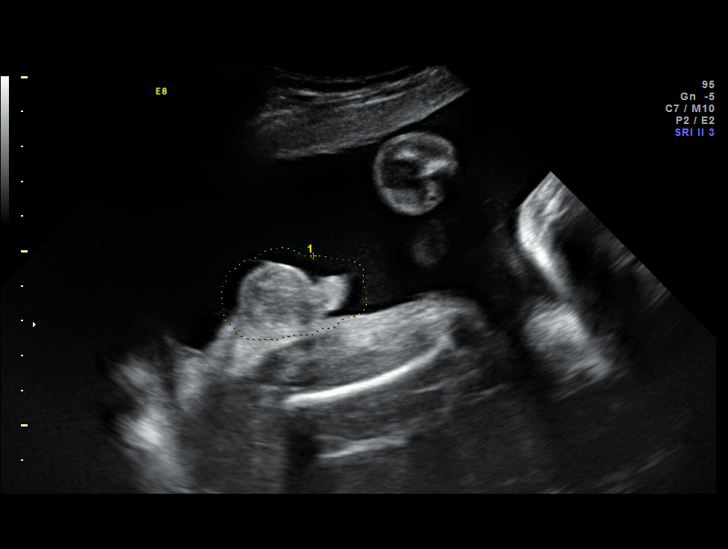

[12 of 28 positions shown; findings below may reference images not displayed]

OBSTETRICS REPORT
                      (Signed Final 02/10/2013 [DATE])

 Name:       MOZAMBIA ADUMELENG                  Visit Date: 02/10/2013 [DATE]
             HUQ

Service(s) Provided

 US OB FOLLOW UP                                       76816.1
Indications

 Hypertension - Chronic/Pre-existing
 Poor obstetric history (prior pre-term labor)
 History of cesarean delivery, currently pregnant      654.20,
 Maternal renal disease
 Poor obstetric history: Previous preterm delivery
 35 wks
Fetal Evaluation

 Num Of Fetuses:    1
 Fetal Heart Rate:  129                          bpm
 Cardiac Activity:  Observed
 Presentation:      Cephalic
 Placenta:          Posterior, above cervical
                    os

 Amniotic Fluid
 AFI FV:      Subjectively within normal limits
 AFI Sum:     17.2    cm       64  %Tile     Larg Pckt:    4.68  cm
 RUQ:   4.68    cm   RLQ:    4.53   cm    LUQ:   4.61    cm   LLQ:    3.38   cm
Biometry

 BPD:     78.9  mm     G. Age:  31w 5d                CI:         81.0   70 - 86
 OFD:     97.4  mm                                    FL/HC:      18.1   19.6 -

 HC:     277.6  mm     G. Age:  30w 3d       44  %    HC/AC:      1.16   0.99 -

 AC:     239.2  mm     G. Age:  28w 2d       14  %    FL/BPD:     63.8   71 - 87
 FL:      50.3  mm     G. Age:  27w 0d      < 3  %    FL/AC:      21.0   20 - 24
 HUM:     46.4  mm     G. Age:  27w 2d      < 5  %

 Est. FW:    8816  gm    2 lb 10 oz      28  %
Gestational Age

 LMP:           27w 5d        Date:  07/31/12                 EDD:   05/07/13
 U/S Today:     29w 3d                                        EDD:   04/25/13
 Best:          29w 3d     Det. By:  U/S (01/13/13)           EDD:   04/25/13
Anatomy

 Cranium:          Appears normal         Aortic Arch:      Previously seen
 Fetal Cavum:      Previously seen        Ductal Arch:      Previously seen
 Ventricles:       Appears normal         Diaphragm:        Appears normal
 Choroid Plexus:   Previously seen        Stomach:          Appears normal
 Cerebellum:       Previously seen        Abdomen:          Appears normal
 Posterior Fossa:  Previously seen        Abdominal Wall:   Previously seen
 Nuchal Fold:      Previously seen        Cord Vessels:     Appears normal (3
                                                            vessel cord)
 Face:             Orbits appear          Kidneys:          Appear normal
                   normal
 Lips:             Previously seen        Bladder:          Appears normal
 Palate:           Not well visualized    Spine:            Previously seen
 Heart:            Appears normal         Lower             Previously seen
                   (4CH, axis, and        Extremities:
                   situs)
 RVOT:             Previously seen        Upper             Previously seen
                                          Extremities:
 LVOT:             Appears normal

 Other:  Fetus appears to be a male. 5th digit visualized. Nasal bone
         visualized.
Cervix Uterus Adnexa

 Cervical Length:    3        cm

 Cervix:       Normal appearance by transabdominal scan.

 Left Ovary:    Previously seen.
 Right Ovary:   Previously seen
Comments

 Ms. Bran returns for follow up due to a history of
 chronic hypertension and renal insufficiency.  Her most
 recent serum Creatinine was 2.87 mg/dl and baseline 24-hr
 urine protein was 1.2 g/24 hrs.  She is currently on Labetolol
 400 mg BID.   She is currently followed by Nephrology.  BPs
 in clnic today were 147/110; repeat 135/100
Impression

 Single IUP at 29 [DATE] weeks
 Interval growth is appropriate (28th %tile)
 Normal interval anatomy
 Normal amniotic fluid volume
Recommendations

 Recommend follow-up ultrasound examination in 4 weeks for
 interval growth.
 Antepartum fetal testing beginning at 32 weeks gestation.
 May continue to titrate Labetolol upward to maintain BPs in
 [REDACTED]'s range.

## 2014-08-23 ENCOUNTER — Encounter: Payer: Self-pay | Admitting: *Deleted

## 2014-10-04 ENCOUNTER — Other Ambulatory Visit: Payer: Self-pay | Admitting: *Deleted

## 2014-10-04 DIAGNOSIS — M79602 Pain in left arm: Secondary | ICD-10-CM

## 2014-10-04 DIAGNOSIS — T82510A Breakdown (mechanical) of surgically created arteriovenous fistula, initial encounter: Secondary | ICD-10-CM

## 2014-10-08 ENCOUNTER — Encounter: Payer: Self-pay | Admitting: Surgery

## 2014-10-11 ENCOUNTER — Ambulatory Visit (INDEPENDENT_AMBULATORY_CARE_PROVIDER_SITE_OTHER): Payer: Self-pay | Admitting: Surgery

## 2014-10-11 ENCOUNTER — Ambulatory Visit (HOSPITAL_COMMUNITY): Payer: Self-pay

## 2014-10-11 ENCOUNTER — Other Ambulatory Visit: Payer: Self-pay

## 2014-10-11 ENCOUNTER — Encounter: Payer: Self-pay | Admitting: Surgery

## 2014-10-11 VITALS — BP 167/104 | HR 95 | Ht <= 58 in | Wt 84.6 lb

## 2014-10-11 DIAGNOSIS — N185 Chronic kidney disease, stage 5: Secondary | ICD-10-CM

## 2014-10-11 NOTE — Progress Notes (Signed)
Patient name: Karla Garner MRN: [id number] DOB: June 06, 1986 Sex: female   Referred by: Dr. Justin Mend  Reason for referral:  Chief Complaint  Patient presents with  . New Evaluation    pain and aneurysms over L AVF    HISTORY OF PRESENT ILLNESS: This is a 28 year old female who comes in today with complaints of pain around her left brachiocephalic fistula which was placed at [facility].  The patient is not on dialysis currently.  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  She has been told in the past that she would require dialysis and has not needed it for the past year.  Her most recent creatinine was 6.9.  Past Medical History  Diagnosis Date  . Glomerulonephritis     Past Surgical History  Procedure Laterality Date  . Cesarean section      2009    History   Social History  . Marital Status: Single    Spouse Name: N/A    Number of Children: N/A  . Years of Education: N/A   Occupational History  . Not on file.   Social History Main Topics  . Smoking status: Never Smoker   . Smokeless tobacco: Never Used  . Alcohol Use: No  . Drug Use: No  . Sexual Activity: Yes   Other Topics Concern  . Not on file   Social History Narrative    No family history on file.  Allergies as of 10/11/2014  . (No Known Allergies)    Current Outpatient Prescriptions on File Prior to Visit  Medication Sig Dispense Refill  . hydroxyprogesterone caproate (DELALUTIN) 250 mg/mL OIL Inject 250 mg into the muscle once a week. mondays    . Prenatal Vit-Fe Fumarate-FA (PRENATAL MULTIVITAMIN) TABS Take 1 tablet by mouth daily at 12 noon.     Current Facility-Administered Medications on File Prior to Visit  Medication Dose Route Frequency Provider Last Rate Last Dose  . influenza  inactive virus vaccine (FLUZONE/FLUARIX) injection 0.5 mL  0.5 mL Intramuscular Once Seabron Spates, CNM         REVIEW OF SYSTEMS: Please see history of present illness,  otherwise negative  PHYSICAL EXAMINATION: General: The patient appears their stated age.  Vital signs are BP 167/104 mmHg  Pulse 95  Ht 4\' 10"  (1.473 m)  Wt 84 lb 9.6 oz (38.374 kg)  BMI 17.69 kg/m2  SpO2 100% HEENT:  No gross abnormalities Pulmonary: Respirations are non-labored Musculoskeletal: There are no major deformities.   Neurologic: No focal weakness or paresthesias are detected, Skin: There are no ulcer or rashes noted. Psychiatric: The patient has normal affect. Cardiovascular: There is a regular rate and rhythm without significant murmur appreciated.  Ectatic appearance of the left brachiocephalic fistula with several large branches.  It is tender to the touch.  No obvious infection.  Diagnostic Studies: None, as the patient arrived way beyond her appointment time and could not have a vascular lab study    Assessment:  Chronic renal insufficiency Plan: I initially discussed with the patient that she potentially could have aneurysmorrhaphy of the proximal portion of her fistula as well as branch ligation.  This may help with some of the discomfort she is having as it does appear to be associated with the branches and the ectatic appearance of the proximal fistula.  I discussed this with the translator present.  The patient then stated that she would like to have her fistula removed.  I told her that  she would not be able to get dialysis.  She says she was told that she would die without dialysis 1 year ago, and that has not happened.  She states that she is willing to die rather than start dialysis.  She is very clear with this.  She was asked multiple times on this issue.  Apparently she has talked other doctors about this as well.  Therefore after a long discussion with the interpreter, I have agreed to ligate her fistula with the hope that it will help her pain in her arm.  I told her that I could not guarantee that this would help it go away completely.  I'm going to have  her see Dr. would have to talk to her 1 more time prior to ligating her fistula.  This is been scheduled for Thursday, January 14.     Eldridge Abrahams, M.D. [facility] [place] Office: [phone] Pager:  [phone]

## 2014-10-12 ENCOUNTER — Telehealth: Payer: Self-pay | Admitting: Surgery

## 2014-10-12 NOTE — Telephone Encounter (Signed)
LVM for nephew Dellis Filbert per pt's request - Dr. Justin Mend aware of problems with Fistula. Is not in office and will not be seeing patient before surgery.

## 2014-11-04 ENCOUNTER — Encounter (HOSPITAL_COMMUNITY): Payer: Self-pay | Admitting: *Deleted

## 2014-11-04 MED ORDER — DEXTROSE 5 % IV SOLN
1.5000 g | INTRAVENOUS | Status: DC
  Filled 2014-11-04: qty 1.5

## 2014-11-04 MED ORDER — CHLORHEXIDINE GLUCONATE CLOTH 2 % EX PADS: 6.0000 | MEDICATED_PAD | Freq: Once | CUTANEOUS | Status: DC

## 2014-11-04 MED ORDER — SODIUM CHLORIDE 0.9 % IV SOLN
INTRAVENOUS | Status: DC
  Administered 2014-11-05 (×2): via INTRAVENOUS

## 2014-11-04 NOTE — Progress Notes (Signed)
I was unable to reach patient by phone.  I left  A message on voice mail.  I instructed the patient to arrive at [place] entrance at 10:30, register in admitting office,  nothing to eat or drink after midnight.   I instructed the patient to take the following medications in the am with just enough water to get them down: None  I asked patient to not wear any lotions, powders, cologne, jewelry, piercing, make-up or nail polish.  I asked the patient to call [phone], in the am if there were any questions or problems.   Patient's listed phone number is not in service; the "other" number has someone who specks English on voice mail.  I left the above message in [place] and interpreter Karla Garner # [phone] left the message in [place].

## 2014-11-05 ENCOUNTER — Ambulatory Visit (HOSPITAL_COMMUNITY)
Admission: RE | Admit: 2014-11-05 | Discharge: 2014-11-05 | Disposition: A | Discharge status: Home / Self Care | Payer: Self-pay | Source: Ambulatory Visit | Attending: Surgery | Admitting: Surgery

## 2014-11-05 ENCOUNTER — Ambulatory Visit (HOSPITAL_COMMUNITY): Payer: Self-pay | Admitting: Certified Registered Nurse Anesthetist

## 2014-11-05 ENCOUNTER — Encounter (HOSPITAL_COMMUNITY): Payer: Self-pay | Admitting: *Deleted

## 2014-11-05 ENCOUNTER — Encounter (HOSPITAL_COMMUNITY)
Admission: RE | Disposition: A | Discharge status: Home / Self Care | Payer: Self-pay | Source: Ambulatory Visit | Attending: Surgery

## 2014-11-05 DIAGNOSIS — T82848A Pain from vascular prosthetic devices, implants and grafts, initial encounter: Secondary | ICD-10-CM | POA: Insufficient documentation

## 2014-11-05 DIAGNOSIS — N189 Chronic kidney disease, unspecified: Secondary | ICD-10-CM | POA: Insufficient documentation

## 2014-11-05 DIAGNOSIS — D631 Anemia in chronic kidney disease: Secondary | ICD-10-CM | POA: Diagnosis present

## 2014-11-05 DIAGNOSIS — T82898A Other specified complication of vascular prosthetic devices, implants and grafts, initial encounter: Secondary | ICD-10-CM

## 2014-11-05 DIAGNOSIS — Y848 Other medical procedures as the cause of abnormal reaction of the patient, or of later complication, without mention of misadventure at the time of the procedure: Secondary | ICD-10-CM | POA: Insufficient documentation

## 2014-11-05 DIAGNOSIS — Y92239 Unspecified place in hospital as the place of occurrence of the external cause: Secondary | ICD-10-CM | POA: Insufficient documentation

## 2014-11-05 HISTORY — DX: Other specified postprocedural states: Z98.890

## 2014-11-05 HISTORY — DX: Thrombocytopenia, unspecified: D69.6

## 2014-11-05 HISTORY — PX: LIGATION OF ARTERIOVENOUS  FISTULA: SHX5948

## 2014-11-05 HISTORY — DX: Essential (primary) hypertension: I10

## 2014-11-05 HISTORY — DX: Nausea with vomiting, unspecified: R11.2

## 2014-11-05 LAB — POCT I-STAT 4, (NA,K, GLUC, HGB,HCT)
Glucose, Bld: 82 mg/dL (ref 70–99)
HEMATOCRIT: 32 % — AB (ref 36.0–46.0)
Hemoglobin: 10.9 g/dL — ABNORMAL LOW (ref 12.0–15.0)
POTASSIUM: 3.7 mmol/L (ref 3.5–5.1)
Sodium: 142 mmol/L (ref 135–145)

## 2014-11-05 LAB — HCG, SERUM, QUALITATIVE: PREG SERUM: NEGATIVE

## 2014-11-05 SURGERY — LIGATION OF ARTERIOVENOUS  FISTULA
Anesthesia: Monitor Anesthesia Care | Site: Arm Upper | Laterality: Left

## 2014-11-05 MED ORDER — OXYCODONE HCL 5 MG PO TABS: 5.0000 mg | ORAL_TABLET | Freq: Four times a day (QID) | ORAL | Status: DC | PRN

## 2014-11-05 MED ORDER — OXYCODONE HCL 5 MG/5ML PO SOLN: 5.0000 mg | Freq: Once | ORAL | Status: AC | PRN

## 2014-11-05 MED ORDER — DEXTROSE 5 % IV SOLN
1.5000 g | INTRAVENOUS | Status: DC | PRN
  Administered 2014-11-05: 1.5 g via INTRAVENOUS

## 2014-11-05 MED ORDER — FENTANYL CITRATE 0.05 MG/ML IJ SOLN
25.0000 ug | INTRAMUSCULAR | Status: DC | PRN
Start: 2014-11-05 — End: 2014-11-07
  Administered 2014-11-05 (×2): 50 ug via INTRAVENOUS
  Administered 2014-11-05 (×2): 25 ug via INTRAVENOUS

## 2014-11-05 MED ORDER — ACETAMINOPHEN 325 MG PO TABS
325.0000 mg | ORAL_TABLET | ORAL | Status: DC | PRN
  Administered 2014-11-05: 650 mg via ORAL

## 2014-11-05 MED ORDER — FENTANYL CITRATE 0.05 MG/ML IJ SOLN
INTRAMUSCULAR | Status: AC
Start: 2014-11-05 — End: 2014-11-06
  Filled 2014-11-05: qty 2

## 2014-11-05 MED ORDER — LIDOCAINE-EPINEPHRINE (PF) 1 %-1:200000 IJ SOLN
INTRAMUSCULAR | Status: AC
  Filled 2014-11-05: qty 10

## 2014-11-05 MED ORDER — OXYCODONE HCL 5 MG PO TABS
ORAL_TABLET | ORAL | Status: AC
  Filled 2014-11-05: qty 1

## 2014-11-05 MED ORDER — ACETAMINOPHEN 325 MG PO TABS
ORAL_TABLET | ORAL | Status: AC
  Filled 2014-11-05: qty 2

## 2014-11-05 MED ORDER — LIDOCAINE-EPINEPHRINE (PF) 1 %-1:200000 IJ SOLN
INTRAMUSCULAR | Status: DC | PRN
  Administered 2014-11-05: 10 mL

## 2014-11-05 MED ORDER — OXYCODONE HCL 5 MG PO TABS
5.0000 mg | ORAL_TABLET | Freq: Once | ORAL | Status: AC | PRN
  Administered 2014-11-05: 5 mg via ORAL

## 2014-11-05 MED ORDER — 0.9 % SODIUM CHLORIDE (POUR BTL) OPTIME
TOPICAL | Status: DC | PRN
  Administered 2014-11-05: 1000 mL

## 2014-11-05 MED ORDER — ACETAMINOPHEN 160 MG/5ML PO SOLN
325.0000 mg | ORAL | Status: DC | PRN
  Filled 2014-11-05: qty 20.3

## 2014-11-05 MED ORDER — FENTANYL CITRATE 0.05 MG/ML IJ SOLN
INTRAMUSCULAR | Status: DC | PRN
  Administered 2014-11-05: 50 ug via INTRAVENOUS

## 2014-11-05 MED ORDER — MIDAZOLAM HCL 5 MG/5ML IJ SOLN
INTRAMUSCULAR | Status: DC | PRN
  Administered 2014-11-05: 1 mg via INTRAVENOUS

## 2014-11-05 MED ORDER — PROPOFOL INFUSION 10 MG/ML OPTIME
INTRAVENOUS | Status: DC | PRN
  Administered 2014-11-05: 100 ug/kg/min via INTRAVENOUS

## 2014-11-05 MED ORDER — FENTANYL CITRATE 0.05 MG/ML IJ SOLN
INTRAMUSCULAR | Status: AC
  Filled 2014-11-05: qty 2

## 2014-11-05 MED ORDER — ONDANSETRON HCL 4 MG/2ML IJ SOLN
INTRAMUSCULAR | Status: DC | PRN
  Administered 2014-11-05: 4 mg via INTRAVENOUS

## 2014-11-05 SURGICAL SUPPLY — 32 items
BLADE SURG 10 STRL SS (BLADE) ×3 IMPLANT
CANISTER SUCTION 2500CC (MISCELLANEOUS) ×3 IMPLANT
CLIP TI MEDIUM 6 (CLIP) ×3 IMPLANT
CLIP TI WIDE RED SMALL 6 (CLIP) ×3 IMPLANT
COVER SURGICAL LIGHT HANDLE (MISCELLANEOUS) ×3 IMPLANT
ELECT REM PT RETURN 9FT ADLT (ELECTROSURGICAL) ×3
ELECTRODE REM PT RTRN 9FT ADLT (ELECTROSURGICAL) ×1 IMPLANT
GAUZE SPONGE 4X4 12PLY STRL (GAUZE/BANDAGES/DRESSINGS) ×3 IMPLANT
GEL ULTRASOUND 20GR AQUASONIC (MISCELLANEOUS) IMPLANT
GLOVE BIOGEL PI IND STRL 6.5 (GLOVE) ×2 IMPLANT
GLOVE BIOGEL PI INDICATOR 6.5 (GLOVE) ×4
GLOVE SS BIOGEL STRL SZ 7 (GLOVE) ×1 IMPLANT
GLOVE SUPERSENSE BIOGEL SZ 7 (GLOVE) ×2
GLOVE SURG SS PI 7.0 STRL IVOR (GLOVE) ×3 IMPLANT
GOWN STRL REUS W/ TWL LRG LVL3 (GOWN DISPOSABLE) ×2 IMPLANT
GOWN STRL REUS W/TWL LRG LVL3 (GOWN DISPOSABLE) ×4
KIT BASIN OR (CUSTOM PROCEDURE TRAY) ×3 IMPLANT
KIT ROOM TURNOVER OR (KITS) ×3 IMPLANT
LIQUID BAND (GAUZE/BANDAGES/DRESSINGS) ×3 IMPLANT
NS IRRIG 1000ML POUR BTL (IV SOLUTION) ×3 IMPLANT
PACK CV ACCESS (CUSTOM PROCEDURE TRAY) ×3 IMPLANT
PAD ARMBOARD 7.5X6 YLW CONV (MISCELLANEOUS) ×6 IMPLANT
PROBE PENCIL 8 MHZ STRL DISP (MISCELLANEOUS) IMPLANT
SUT PROLENE 6 0 BV (SUTURE) IMPLANT
SUT PROLENE 6 0 CC (SUTURE) ×3 IMPLANT
SUT SILK 0 TIES 10X30 (SUTURE) ×3 IMPLANT
SUT VIC AB 3-0 SH 27 (SUTURE) ×2
SUT VIC AB 3-0 SH 27X BRD (SUTURE) ×1 IMPLANT
SWAB COLLECTION DEVICE MRSA (MISCELLANEOUS) IMPLANT
TUBE ANAEROBIC SPECIMEN COL (MISCELLANEOUS) IMPLANT
UNDERPAD 30X30 INCONTINENT (UNDERPADS AND DIAPERS) ×3 IMPLANT
WATER STERILE IRR 1000ML POUR (IV SOLUTION) ×3 IMPLANT

## 2014-11-05 NOTE — Interval H&P Note (Signed)
History and Physical Interval Note:  11/05/2014 11:41 AM  Karla Garner  has presented today for surgery, with the diagnosis of Chronic renal insufficiency N18.9  The various methods of treatment have been discussed with the patient and family. After consideration of risks, benefits and other options for treatment, the patient has consented to  Procedure(s): LIGATION OF ARTERIOVENOUS  FISTULA (Left) as a surgical intervention .  The patient's history has been reviewed, patient examined, no change in status, stable for surgery.  I have reviewed the patient's chart and labs.  Questions were answered to the patient's satisfaction.     Tinnie Gens

## 2014-11-05 NOTE — H&P (View-Only) (Signed)
Patient name: Karla Garner MRN: [id number] DOB: Jan 29, 1986 Sex: female   Referred by: Dr. Justin Mend  Reason for referral:  Chief Complaint  Patient presents with  . New Evaluation    pain and aneurysms over L AVF    HISTORY OF PRESENT ILLNESS: This is a 29 year old female who comes in today with complaints of pain around her left brachiocephalic fistula which was placed at [facility].  The patient is not on dialysis currently.  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  She has been told in the past that she would require dialysis and has not needed it for the past year.  Her most recent creatinine was 6.9.  Past Medical History  Diagnosis Date  . Glomerulonephritis     Past Surgical History  Procedure Laterality Date  . Cesarean section      2009    History   Social History  . Marital Status: Single    Spouse Name: N/A    Number of Children: N/A  . Years of Education: N/A   Occupational History  . Not on file.   Social History Main Topics  . Smoking status: Never Smoker   . Smokeless tobacco: Never Used  . Alcohol Use: No  . Drug Use: No  . Sexual Activity: Yes   Other Topics Concern  . Not on file   Social History Narrative    No family history on file.  Allergies as of 10/11/2014  . (No Known Allergies)    Current Outpatient Prescriptions on File Prior to Visit  Medication Sig Dispense Refill  . hydroxyprogesterone caproate (DELALUTIN) 250 mg/mL OIL Inject 250 mg into the muscle once a week. mondays    . Prenatal Vit-Fe Fumarate-FA (PRENATAL MULTIVITAMIN) TABS Take 1 tablet by mouth daily at 12 noon.     Current Facility-Administered Medications on File Prior to Visit  Medication Dose Route Frequency Provider Last Rate Last Dose  . influenza  inactive virus vaccine (FLUZONE/FLUARIX) injection 0.5 mL  0.5 mL Intramuscular Once Seabron Spates, CNM         REVIEW OF SYSTEMS: Please see history of present illness,  otherwise negative  PHYSICAL EXAMINATION: General: The patient appears their stated age.  Vital signs are BP 167/104 mmHg  Pulse 95  Ht 4\' 10"  (1.473 m)  Wt 84 lb 9.6 oz (38.374 kg)  BMI 17.69 kg/m2  SpO2 100% HEENT:  No gross abnormalities Pulmonary: Respirations are non-labored Musculoskeletal: There are no major deformities.   Neurologic: No focal weakness or paresthesias are detected, Skin: There are no ulcer or rashes noted. Psychiatric: The patient has normal affect. Cardiovascular: There is a regular rate and rhythm without significant murmur appreciated.  Ectatic appearance of the left brachiocephalic fistula with several large branches.  It is tender to the touch.  No obvious infection.  Diagnostic Studies: None, as the patient arrived way beyond her appointment time and could not have a vascular lab study    Assessment:  Chronic renal insufficiency Plan: I initially discussed with the patient that she potentially could have aneurysmorrhaphy of the proximal portion of her fistula as well as branch ligation.  This may help with some of the discomfort she is having as it does appear to be associated with the branches and the ectatic appearance of the proximal fistula.  I discussed this with the translator present.  The patient then stated that she would like to have her fistula removed.  I told her that  she would not be able to get dialysis.  She says she was told that she would die without dialysis 1 year ago, and that has not happened.  She states that she is willing to die rather than start dialysis.  She is very clear with this.  She was asked multiple times on this issue.  Apparently she has talked other doctors about this as well.  Therefore after a long discussion with the interpreter, I have agreed to ligate her fistula with the hope that it will help her pain in her arm.  I told her that I could not guarantee that this would help it go away completely.  I'm going to have  her see Dr. would have to talk to her 1 more time prior to ligating her fistula.  This is been scheduled for Thursday, January 14.     Eldridge Abrahams, M.D. [facility] [place] Office: [phone] Pager:  [phone]

## 2014-11-05 NOTE — Transfer of Care (Signed)
Immediate Anesthesia Transfer of Care Note  Patient: Karla Garner  Procedure(s) Performed: Procedure(s): LIGATION OF LEFT ARM  BRACHIO-CEPHALIC ARTERIOVENOUS  FISTULA ,& REPAIR OF BRACHIAL ARTERY. (Left)  Patient Location: PACU  Anesthesia Type:MAC  Level of Consciousness: awake and alert   Airway & Oxygen Therapy: Patient Spontanous Breathing and Patient connected to nasal cannula oxygen  Post-op Assessment: Report given to PACU RN and Post -op Vital signs reviewed and stable  Post vital signs: Reviewed and stable  Complications: No apparent anesthesia complications

## 2014-11-05 NOTE — Anesthesia Postprocedure Evaluation (Signed)
  Anesthesia Post-op Note  Patient: Karla Garner  Procedure(s) Performed: Procedure(s): LIGATION OF LEFT ARM  BRACHIO-CEPHALIC ARTERIOVENOUS  FISTULA ,& REPAIR OF BRACHIAL ARTERY. (Left)  Patient Location: PACU  Anesthesia Type:MAC  Level of Consciousness: awake  Airway and Oxygen Therapy: Patient Spontanous Breathing  Post-op Pain: mild  Post-op Assessment: Post-op Vital signs reviewed, Patient's Cardiovascular Status Stable, Respiratory Function Stable, Patent Airway, No signs of Nausea or vomiting and Pain level controlled  Post-op Vital Signs: Reviewed and stable  Last Vitals:  Filed Vitals:   11/05/14 1430  BP: 143/92  Pulse: 73  Temp:   Resp: 18    Complications: No apparent anesthesia complications

## 2014-11-05 NOTE — Op Note (Signed)
OPERATIVE REPORT  Date of Surgery: 11/05/2014  Surgeon: Tinnie Gens, MD  Assistant: Nurse  Pre-op Diagnosis: Chronic renal insufficiency with painful left brachial-cephalic AV fistula  Post-op Diagnosis: Same  Procedure: Procedure(s): LIGATION OF LEFT ARM  BRACHIO-CEPHALIC ARTERIOVENOUS  FISTULA ,& REPAIR OF BRACHIAL ARTERY.  Anesthesia: Mac  EBL: Minimal  Complications: None  Procedure Details: The patient was taken to the operating room placed in supine position at which time left upper extremity was prepped with Betadine scrub and solution draped in routine sterile manner. After infiltration forms and Xylocaine with epinephrine a short longitudinal incision was made to the previous scar in the distal upper arm where the brachial-cephalic AV fistula had been created. The vein was quite dilated with a few branches noted. Proximal and distal control of the brachial artery was obtained and the vein was encircled with a 2-0 silk tie. It was ligated about 3-4 cm from the anastomosis. The artery was an occluded proximally and distally with fine vascular clamps and the vein was excised from the brachial artery leaving a 4 mm cuff circumflex Truman Hayward. Artery was widely patent proximally and distally with good inflow and good backbleeding. The cuff was then oversewn with 2 layers of 6-0 Prolene to serve as a patch angioplasty. And this completed clamps released there was an excellent pulse in the brachial artery and the wound and the radial artery distally. No heparin or protamine was given. Adequate hemostasis was achieved wound closed in layers with Vicryl subcuticular fashion with Dermabond patient taken to recovery in stable condition   Tinnie Gens, MD 11/05/2014 1:21 PM

## 2014-11-05 NOTE — Anesthesia Preprocedure Evaluation (Addendum)
Anesthesia Evaluation  Patient identified by MRN, date of birth, ID band Patient awake    Reviewed: Allergy & Precautions, NPO status , Patient's Chart, lab work & pertinent test results  History of Anesthesia Complications (+) PONV and history of anesthetic complications  Airway Mallampati: I  TM Distance: >3 FB Neck ROM: Full    Dental  (+) Teeth Intact   Pulmonary  breath sounds clear to auscultation        Cardiovascular hypertension, negative cardio ROS  Rhythm:Regular     Neuro/Psych negative neurological ROS  negative psych ROS   GI/Hepatic negative GI ROS, Neg liver ROS,   Endo/Other  negative endocrine ROS  Renal/GU      Musculoskeletal   Abdominal   Peds  Hematology  (+) anemia ,   Anesthesia Other Findings   Reproductive/Obstetrics                            Anesthesia Physical Anesthesia Plan  ASA: II  Anesthesia Plan: MAC   Post-op Pain Management:    Induction: Intravenous  Airway Management Planned: Simple Face Mask and Natural Airway  Additional Equipment: None  Intra-op Plan:   Post-operative Plan: Extubation in OR  Informed Consent: I have reviewed the patients History and Physical, chart, labs and discussed the procedure including the risks, benefits and alternatives for the proposed anesthesia with the patient or authorized representative who has indicated his/her understanding and acceptance.   Dental advisory given  Plan Discussed with: CRNA and Surgeon  Anesthesia Plan Comments:         Anesthesia Quick Evaluation

## 2014-11-05 NOTE — Discharge Instructions (Signed)

## 2014-11-08 ENCOUNTER — Encounter (HOSPITAL_COMMUNITY): Payer: Self-pay | Admitting: Vascular Surgery

## 2015-04-08 ENCOUNTER — Encounter (HOSPITAL_COMMUNITY): Payer: Self-pay | Admitting: Emergency Medicine

## 2015-04-08 ENCOUNTER — Inpatient Hospital Stay (HOSPITAL_COMMUNITY)
Admission: EM | Admit: 2015-04-08 | Discharge: 2015-04-16 | DRG: 673 | Disposition: A | Discharge status: Home / Self Care | Payer: Self-pay | Attending: Internal Medicine | Admitting: Internal Medicine

## 2015-04-08 DIAGNOSIS — E875 Hyperkalemia: Secondary | ICD-10-CM | POA: Diagnosis present

## 2015-04-08 DIAGNOSIS — D631 Anemia in chronic kidney disease: Secondary | ICD-10-CM | POA: Diagnosis present

## 2015-04-08 DIAGNOSIS — N186 End stage renal disease: Secondary | ICD-10-CM | POA: Diagnosis present

## 2015-04-08 DIAGNOSIS — R630 Anorexia: Secondary | ICD-10-CM | POA: Diagnosis present

## 2015-04-08 DIAGNOSIS — N179 Acute kidney failure, unspecified: Secondary | ICD-10-CM | POA: Diagnosis present

## 2015-04-08 DIAGNOSIS — R7989 Other specified abnormal findings of blood chemistry: Secondary | ICD-10-CM | POA: Diagnosis present

## 2015-04-08 DIAGNOSIS — M542 Cervicalgia: Secondary | ICD-10-CM | POA: Diagnosis present

## 2015-04-08 DIAGNOSIS — D649 Anemia, unspecified: Secondary | ICD-10-CM

## 2015-04-08 DIAGNOSIS — R200 Anesthesia of skin: Secondary | ICD-10-CM | POA: Diagnosis present

## 2015-04-08 DIAGNOSIS — Z419 Encounter for procedure for purposes other than remedying health state, unspecified: Secondary | ICD-10-CM

## 2015-04-08 DIAGNOSIS — W19XXXA Unspecified fall, initial encounter: Secondary | ICD-10-CM | POA: Diagnosis present

## 2015-04-08 DIAGNOSIS — N390 Urinary tract infection, site not specified: Secondary | ICD-10-CM | POA: Diagnosis present

## 2015-04-08 DIAGNOSIS — Z992 Dependence on renal dialysis: Secondary | ICD-10-CM

## 2015-04-08 DIAGNOSIS — I120 Hypertensive chronic kidney disease with stage 5 chronic kidney disease or end stage renal disease: Principal | ICD-10-CM | POA: Diagnosis present

## 2015-04-08 DIAGNOSIS — N189 Chronic kidney disease, unspecified: Secondary | ICD-10-CM

## 2015-04-08 DIAGNOSIS — N19 Unspecified kidney failure: Secondary | ICD-10-CM

## 2015-04-08 DIAGNOSIS — Z87440 Personal history of urinary (tract) infections: Secondary | ICD-10-CM

## 2015-04-08 DIAGNOSIS — M62838 Other muscle spasm: Secondary | ICD-10-CM | POA: Diagnosis present

## 2015-04-08 DIAGNOSIS — K5900 Constipation, unspecified: Secondary | ICD-10-CM | POA: Diagnosis present

## 2015-04-08 LAB — COMPREHENSIVE METABOLIC PANEL
ALK PHOS: 73 U/L (ref 38–126)
ALT: 17 U/L (ref 14–54)
AST: 17 U/L (ref 15–41)
Albumin: 3.8 g/dL (ref 3.5–5.0)
Anion gap: 14 (ref 5–15)
BILIRUBIN TOTAL: 0.6 mg/dL (ref 0.3–1.2)
BUN: 142 mg/dL — ABNORMAL HIGH (ref 6–20)
CO2: 14 mmol/L — ABNORMAL LOW (ref 22–32)
CREATININE: 17.14 mg/dL — AB (ref 0.44–1.00)
Calcium: 4.9 mg/dL — CL (ref 8.9–10.3)
Chloride: 107 mmol/L (ref 101–111)
GFR calc Af Amer: 3 mL/min — ABNORMAL LOW (ref >60)
GFR, EST NON AFRICAN AMERICAN: 2 mL/min — AB (ref >60)
Glucose, Bld: 102 mg/dL — ABNORMAL HIGH (ref 65–99)
Potassium: 5.2 mmol/L — ABNORMAL HIGH (ref 3.5–5.1)
Sodium: 135 mmol/L (ref 135–145)
Total Protein: 7.1 g/dL (ref 6.5–8.1)

## 2015-04-08 LAB — CBC WITH DIFFERENTIAL/PLATELET
BASOS PCT: 0 % (ref 0–1)
Basophils Absolute: 0 10*3/uL (ref 0.0–0.1)
Eosinophils Absolute: 0.1 10*3/uL (ref 0.0–0.7)
Eosinophils Relative: 1 % (ref 0–5)
HEMATOCRIT: 17.2 % — AB (ref 36.0–46.0)
HEMOGLOBIN: 5.8 g/dL — AB (ref 12.0–15.0)
Lymphocytes Relative: 21 % (ref 12–46)
Lymphs Abs: 1.4 10*3/uL (ref 0.7–4.0)
MCH: 27.1 pg (ref 26.0–34.0)
MCHC: 33.7 g/dL (ref 30.0–36.0)
MCV: 80.4 fL (ref 78.0–100.0)
MONO ABS: 0.4 10*3/uL (ref 0.1–1.0)
MONOS PCT: 6 % (ref 3–12)
Neutro Abs: 4.8 10*3/uL (ref 1.7–7.7)
Neutrophils Relative %: 72 % (ref 43–77)
Platelets: 131 10*3/uL — ABNORMAL LOW (ref 150–400)
RBC: 2.14 MIL/uL — ABNORMAL LOW (ref 3.87–5.11)
RDW: 13.8 % (ref 11.5–15.5)
WBC: 6.7 10*3/uL (ref 4.0–10.5)

## 2015-04-08 LAB — I-STAT CHEM 8, ED
BUN: 136 mg/dL — AB (ref 6–20)
CHLORIDE: 108 mmol/L (ref 101–111)
Calcium, Ion: 0.65 mmol/L — ABNORMAL LOW (ref 1.12–1.23)
Creatinine, Ser: >18 mg/dL — ABNORMAL HIGH (ref 0.44–1.00)
Glucose, Bld: 89 mg/dL (ref 65–99)
HCT: 20 % — ABNORMAL LOW (ref 36.0–46.0)
Hemoglobin: 6.8 g/dL — CL (ref 12.0–15.0)
Potassium: 5.2 mmol/L — ABNORMAL HIGH (ref 3.5–5.1)
SODIUM: 135 mmol/L (ref 135–145)
TCO2: 12 mmol/L (ref 0–100)

## 2015-04-08 LAB — URINALYSIS, ROUTINE W REFLEX MICROSCOPIC
Bilirubin Urine: NEGATIVE
GLUCOSE, UA: 100 mg/dL — AB
Ketones, ur: NEGATIVE mg/dL
Nitrite: NEGATIVE
PH: 5.5 (ref 5.0–8.0)
Protein, ur: >300 mg/dL — AB
SPECIFIC GRAVITY, URINE: 1.008 (ref 1.005–1.030)
Urobilinogen, UA: 0.2 mg/dL (ref 0.0–1.0)

## 2015-04-08 LAB — URINE MICROSCOPIC-ADD ON

## 2015-04-08 LAB — POC URINE PREG, ED: Preg Test, Ur: NEGATIVE

## 2015-04-08 LAB — I-STAT CG4 LACTIC ACID, ED

## 2015-04-08 LAB — POC OCCULT BLOOD, ED: Fecal Occult Bld: NEGATIVE

## 2015-04-08 MED ORDER — FENTANYL CITRATE (PF) 100 MCG/2ML IJ SOLN
INTRAMUSCULAR | Status: AC
  Filled 2015-04-08: qty 2

## 2015-04-08 MED ORDER — SODIUM CHLORIDE 0.9 % IV BOLUS (SEPSIS)
1000.0000 mL | Freq: Once | INTRAVENOUS | Status: AC
  Administered 2015-04-08: 1000 mL via INTRAVENOUS

## 2015-04-08 MED ORDER — DEXTROSE 5 % IV SOLN
1.0000 g | INTRAVENOUS | Status: DC
  Administered 2015-04-08 – 2015-04-09 (×2): 1 g via INTRAVENOUS
  Filled 2015-04-08 (×3): qty 10

## 2015-04-08 MED ORDER — HEPARIN SODIUM (PORCINE) 1000 UNIT/ML IJ SOLN
INTRAMUSCULAR | Status: AC
  Filled 2015-04-08: qty 1

## 2015-04-08 MED ORDER — LIDOCAINE HCL 1 % IJ SOLN
INTRAMUSCULAR | Status: AC
  Filled 2015-04-08: qty 20

## 2015-04-08 MED ORDER — MIDAZOLAM HCL 2 MG/2ML IJ SOLN
INTRAMUSCULAR | Status: AC
  Filled 2015-04-08: qty 2

## 2015-04-08 NOTE — ED Notes (Signed)
Spanish interpretor line used to communicate with patient during assessment

## 2015-04-08 NOTE — ED Notes (Signed)
Pt. reports constipation with low abdominal pain onset this week , denies emesis or diarrhea . Denies fever or chills.

## 2015-04-09 ENCOUNTER — Inpatient Hospital Stay (HOSPITAL_COMMUNITY): Payer: Self-pay

## 2015-04-09 DIAGNOSIS — N185 Chronic kidney disease, stage 5: Secondary | ICD-10-CM

## 2015-04-09 DIAGNOSIS — D631 Anemia in chronic kidney disease: Secondary | ICD-10-CM | POA: Diagnosis present

## 2015-04-09 DIAGNOSIS — N186 End stage renal disease: Secondary | ICD-10-CM | POA: Diagnosis present

## 2015-04-09 DIAGNOSIS — N189 Chronic kidney disease, unspecified: Secondary | ICD-10-CM

## 2015-04-09 LAB — CBC
HCT: 16.4 % — ABNORMAL LOW (ref 36.0–46.0)
Hemoglobin: 5.4 g/dL — CL (ref 12.0–15.0)
MCH: 27.3 pg (ref 26.0–34.0)
MCHC: 32.9 g/dL (ref 30.0–36.0)
MCV: 82.8 fL (ref 78.0–100.0)
PLATELETS: 116 10*3/uL — AB (ref 150–400)
RBC: 1.98 MIL/uL — ABNORMAL LOW (ref 3.87–5.11)
RDW: 13.9 % (ref 11.5–15.5)
WBC: 6.9 10*3/uL (ref 4.0–10.5)

## 2015-04-09 LAB — IRON AND TIBC
Iron: 103 ug/dL (ref 28–170)
Saturation Ratios: 39 % — ABNORMAL HIGH (ref 10.4–31.8)
TIBC: 267 ug/dL (ref 250–450)
UIBC: 164 ug/dL

## 2015-04-09 LAB — BASIC METABOLIC PANEL
Anion gap: 18 — ABNORMAL HIGH (ref 5–15)
BUN: 146 mg/dL — ABNORMAL HIGH (ref 6–20)
CALCIUM: 5.1 mg/dL — AB (ref 8.9–10.3)
CHLORIDE: 105 mmol/L (ref 101–111)
CO2: 11 mmol/L — AB (ref 22–32)
CREATININE: 17.21 mg/dL — AB (ref 0.44–1.00)
GFR calc Af Amer: 3 mL/min — ABNORMAL LOW (ref >60)
GFR calc non Af Amer: 2 mL/min — ABNORMAL LOW (ref >60)
Glucose, Bld: 89 mg/dL (ref 65–99)
Potassium: 5.2 mmol/L — ABNORMAL HIGH (ref 3.5–5.1)
SODIUM: 134 mmol/L — AB (ref 135–145)

## 2015-04-09 LAB — FERRITIN: Ferritin: 311 ng/mL — ABNORMAL HIGH (ref 11–307)

## 2015-04-09 LAB — MAGNESIUM: Magnesium: 2.1 mg/dL (ref 1.7–2.4)

## 2015-04-09 LAB — LACTATE DEHYDROGENASE: LDH: 246 U/L — ABNORMAL HIGH (ref 98–192)

## 2015-04-09 LAB — LACTIC ACID, PLASMA: LACTIC ACID, VENOUS: 0.5 mmol/L (ref 0.5–2.0)

## 2015-04-09 LAB — PREPARE RBC (CROSSMATCH)

## 2015-04-09 LAB — PHOSPHORUS: PHOSPHORUS: 8.4 mg/dL — AB (ref 2.5–4.6)

## 2015-04-09 LAB — ABO/RH: ABO/RH(D): O POS

## 2015-04-09 MED ORDER — FENTANYL CITRATE (PF) 100 MCG/2ML IJ SOLN
INTRAMUSCULAR | Status: AC | PRN
  Administered 2015-04-09: 25 ug via INTRAVENOUS

## 2015-04-09 MED ORDER — ACETAMINOPHEN 325 MG PO TABS
650.0000 mg | ORAL_TABLET | Freq: Four times a day (QID) | ORAL | Status: DC | PRN
  Administered 2015-04-09 – 2015-04-10 (×3): 650 mg via ORAL
  Filled 2015-04-09 (×3): qty 2

## 2015-04-09 MED ORDER — DARBEPOETIN ALFA 200 MCG/0.4ML IJ SOSY
PREFILLED_SYRINGE | INTRAMUSCULAR | Status: AC
  Filled 2015-04-09: qty 0.4

## 2015-04-09 MED ORDER — DARBEPOETIN ALFA 200 MCG/0.4ML IJ SOSY
200.0000 ug | PREFILLED_SYRINGE | INTRAMUSCULAR | Status: DC
  Administered 2015-04-09: 200 ug via INTRAVENOUS
  Filled 2015-04-09 (×2): qty 0.4

## 2015-04-09 MED ORDER — DOXERCALCIFEROL 4 MCG/2ML IV SOLN
6.0000 ug | INTRAVENOUS | Status: DC
  Administered 2015-04-09 – 2015-04-14 (×3): 6 ug via INTRAVENOUS
  Filled 2015-04-09 (×3): qty 4

## 2015-04-09 MED ORDER — NEPRO/CARBSTEADY PO LIQD: 237.0000 mL | ORAL | Status: DC | PRN

## 2015-04-09 MED ORDER — DOXERCALCIFEROL 4 MCG/2ML IV SOLN
INTRAVENOUS | Status: AC
  Filled 2015-04-09: qty 4

## 2015-04-09 MED ORDER — ALTEPLASE 2 MG IJ SOLR
2.0000 mg | Freq: Once | INTRAMUSCULAR | Status: DC | PRN
  Filled 2015-04-09: qty 2

## 2015-04-09 MED ORDER — PENTAFLUOROPROP-TETRAFLUOROETH EX AERO: 1.0000 "application " | INHALATION_SPRAY | CUTANEOUS | Status: DC | PRN

## 2015-04-09 MED ORDER — SODIUM CHLORIDE 0.9 % IV SOLN: 100.0000 mL | INTRAVENOUS | Status: DC | PRN

## 2015-04-09 MED ORDER — LIDOCAINE HCL (PF) 1 % IJ SOLN: 5.0000 mL | INTRAMUSCULAR | Status: DC | PRN

## 2015-04-09 MED ORDER — CALCIUM CARBONATE ANTACID 500 MG PO CHEW
800.0000 mg | CHEWABLE_TABLET | Freq: Three times a day (TID) | ORAL | Status: DC
  Administered 2015-04-09 – 2015-04-16 (×18): 800 mg via ORAL
  Filled 2015-04-09 (×25): qty 4

## 2015-04-09 MED ORDER — HEPARIN SODIUM (PORCINE) 1000 UNIT/ML DIALYSIS
1000.0000 [IU] | INTRAMUSCULAR | Status: DC | PRN
  Filled 2015-04-09: qty 1

## 2015-04-09 MED ORDER — SODIUM CHLORIDE 0.9 % IV SOLN: Freq: Once | INTRAVENOUS | Status: DC

## 2015-04-09 MED ORDER — LIDOCAINE-PRILOCAINE 2.5-2.5 % EX CREA: 1.0000 "application " | TOPICAL_CREAM | CUTANEOUS | Status: DC | PRN

## 2015-04-09 NOTE — ED Notes (Signed)
Transporting patient to new room assignment. 

## 2015-04-09 NOTE — Progress Notes (Signed)
Critical Calcium of 5.1 called to Dr Joelyn Oms.  Pt currently in IR for Temp cath placement

## 2015-04-09 NOTE — Sedation Documentation (Signed)
Critical Ca called to Dr Joelyn Oms

## 2015-04-09 NOTE — Consult Note (Signed)
Karla Garner Admit Date: 04/08/2015 04/09/2015 Karla Garner Requesting Physician:  Karla Drought DO  Reason for Consult:  Uremia, Azotemia, Hypocalcemia, Hyperkalemia, Anemia HPI:  5F seen at request of Dr. Alcario Garner for evaluation of above-mentioned issues. 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  Started HD at [facility] and subsequently opted to discontinue HD thereafter.  Last seen 09/2014 in our office.  She has been lost to follow-up for several months now. She presented to the emergency room last night with inability to have a bowel movement. She was found to have profound azotemia, anemia, mild hyperkalemia, hypocalcemia. She actually doesn't endorse a tremendous amount of uremic symptoms but does have fatigue, intermittent lower extremity edema, loss of appetite. She did not tolerate dialysis well because of postdialysis fatigue and malaise. After discussing that she has virtually 0% kidney function she is willing to proceed again.   She previously had an AV fistula with aneurysmal changes. She was seen by Dr. Trula Garner because of discomfort related to the fistula. She was encouraged not to have removal of the fistula as it was clear she would likely need dialysis again. At that time the patient was not willing to consider dialysis at all in the future. She subsequently underwent ligation.   CREATININE (mg/dL)  Date Value  02/23/2013 3.91*  01/09/2013 2.87*   CREAT (mg/dL)  Date Value  02/16/2013 3.74*  01/07/2013 2.87*   CREATININE, SER (mg/dL)  Date Value  04/08/2015 >18.00*  04/08/2015 17.14*  02/24/2013 3.91*  02/23/2013 4.09*  02/23/2013 3.79*  02/23/2013 3.86*  09/03/2011 1.58*  08/06/2011 1.56*  07/09/2011 1.20*  05/14/2011 1.12*  ] I/Os:  ROS Balance of 12 systems is negative w/ exceptions as above  PMH  Past Medical History  Diagnosis Date  . Glomerulonephritis   . Thrombocytopenia 2014  . PONV (postoperative nausea and vomiting)     was on dialysis i  during pregnancy2014  . Hypertension    PSH  Past Surgical History  Procedure Laterality Date  . Cesarean section      2009  . Tubal ligation  2014  . Av fistula placement Left   . Ligation of arteriovenous  fistula Left 11/05/2014    Procedure: LIGATION OF LEFT ARM  BRACHIO-CEPHALIC ARTERIOVENOUS  FISTULA ,& REPAIR OF BRACHIAL ARTERY.;  Surgeon: Karla [name], MD;  Location: [facility];  Service: Vascular;  Laterality: Left;   FH No family history on file. SH  reports that she has never smoked. She has never used smokeless tobacco. She reports that she does not drink alcohol or use illicit drugs. Allergies No Known Allergies Home medications Prior to Admission medications   Medication Sig Start Date End Date Taking? Authorizing Provider  oxyCODONE (ROXICODONE) 5 MG immediate release tablet Take 1 tablet (5 mg total) by mouth every 6 (six) hours as needed. Patient not taking: Reported on 04/08/2015 11/05/14   Karla Shouts Rhyne, PA-C    Current Medications Scheduled Meds: . cefTRIAXone (ROCEPHIN)  IV  1 g Intravenous Q24H   Continuous Infusions:  PRN Meds:.acetaminophen  CBC  Recent Labs Lab 04/08/15 2024 04/08/15 2322  WBC 6.7  --   NEUTROABS 4.8  --   HGB 5.8* 6.8*  HCT 17.2* 20.0*  MCV 80.4  --   PLT 131*  --    Basic Metabolic Panel  Recent Labs Lab 04/08/15 2024 04/08/15 2322  NA 135 135  K 5.2* 5.2*  CL 107 108  CO2 14*  --   GLUCOSE 102* 89  BUN 142* 136*  CREATININE 17.14* >18.00*  CALCIUM 4.9*  --     Physical Exam  Blood pressure 132/97, pulse 83, temperature 97.9 F (36.6 C), temperature source Oral, resp. rate 14, weight 40.415 kg (89 lb 1.6 oz), SpO2 99 %, unknown if currently breastfeeding. 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  1. New ESRD 1. Primary  is IgAN 2. Strongly encouraged patient to restart dialysis which she is willing to pursue.  3. We'll need tunneled dialysis catheter today followed by short treatment  4. Have discussed with Dr. Scot Dock of vascular surgery and he will evaluate for permanent access.  5. I am hopeful that we can effectively communicate the critical importance of this and help her to mitigate any symptoms that leads to problems 6. CLIP Pt  2. Anemia 1. LIkely 2/2 CKD 2. Transfused 2u PRBC with HD 3. Start ESA with HD 4. Add on Fe Panel 3. CKD-BMD 1. Hypocalcemia: likely cause of constipation 1. Begin TUMS not at meal time 800mg  TID 2. Start VDRA Hectorol 6qTx 3. High Ca Bath with HD 2. Check Phos 3. Check PTH 4. HTN/Vol: largely euvolemic, no UF today 5. ? UTI on Ceftriaxone  Karla Grippe MD [phone] pgr 04/09/2015, 6:45 AM

## 2015-04-09 NOTE — Progress Notes (Signed)
Subjective: Patient admitted this morning, see detailed H&P by Dr. Alcario Drought  This morning patient is waiting to go for dialysis catheter. Denies any complaints Filed Vitals:   04/09/15 1011  BP: 157/96  Pulse: 94  Temp:   Resp: 14    Chest: Clear Bilaterally Heart : S1S2 RRR Abdomen: Soft, nontender Ext : No edema Neuro: Alert, oriented x 3  A/P End-stage renal disease Patient to get hemodialysis catheter, will eventually get permanent access for dialysis. Hemodialysis today as per nephrology  UTI UA abnormal, patient on ceftriaxone, follow urine culture results.  [place] [facility] [phone]

## 2015-04-09 NOTE — Sedation Documentation (Signed)
Critical calcium reported to Dr Barbie Banner Ca 5.1.  Will give fentanyl only due to Ca level

## 2015-04-09 NOTE — Consult Note (Signed)
[facility] [place]  Patient name: Karla Garner MRN: [id number] DOB: Sep 17, 1986 Sex: female  REASON FOR CONSULT: Needs permanent access  HPI: Karla Garner is a 29 y.o. female who was seen in our office on 10/11/2014 by Dr. Trula Slade. She had a left brachiocephalic AV fistula placed at [facility]. At that time she was not on dialysis. She was complaining of significant pain around the fistula. 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 For this reason her fistula was ligated and her brachial artery was repaired by Dr. Kellie Simmering on 11/05/2014.  She does admit to fatigue and also some edema. In addition she has him anorexia.  The patient was admitted today with uremia, as of tenia, and hyperkalemia.  Her renal failure is secondary IgNA.  She is now willing to proceed with dialysis and plans are to have a catheter placed. We have been asked to evaluate her for long-term access.  Past Medical History  Diagnosis Date  . Glomerulonephritis   . Thrombocytopenia 2014  . PONV (postoperative nausea and vomiting)     was on dialysis i during pregnancy2014  . Hypertension     No family history on file.  SOCIAL HISTORY: History  Substance Use Topics  . Smoking status: Never Smoker   . Smokeless tobacco: Never Used  . Alcohol Use: No    No Known Allergies  Current Facility-Administered Medications  Medication Dose Route Frequency Provider Last Rate Last Dose  . acetaminophen (TYLENOL) tablet 650 mg  650 mg Oral Q6H PRN Dianne Dun, NP   650 mg at 04/09/15 0303  . calcium carbonate (TUMS - dosed in mg elemental calcium) chewable tablet 800 mg of elemental calcium  800 mg of elemental calcium Oral TID WC [name], MD      . cefTRIAXone  (ROCEPHIN) 1 g in dextrose 5 % 50 mL IVPB  1 g Intravenous Q24H Elnora Morrison, MD   Stopped at 04/09/15 0010  . Darbepoetin Alfa (ARANESP) injection 200 mcg  200 mcg Intravenous Q Sat-HD [name], MD      . doxercalciferol (HECTOROL) injection 6 mcg  6 mcg Intravenous Q T,Th,Sa-HD [name], MD        REVIEW OF SYSTEMS: Valu.Nieves ] denotes positive finding; [  ] denotes negative finding CARDIOVASCULAR:  [ ]  chest pain   [ ]  chest pressure   [ ]  palpitations   [ ]  orthopnea   [ ]  dyspnea on exertion   [ ]  claudication   [ ]  rest pain   [ ]  DVT   [ ]  phlebitis PULMONARY:   [ ]  productive cough   [ ]  asthma   [ ]  wheezing NEUROLOGIC:   [ ]  weakness  [ ]  paresthesias  [ ]  aphasia  [ ]  amaurosis  [ ]  dizziness HEMATOLOGIC:   [ ]  bleeding problems   [ ]  clotting disorders MUSCULOSKELETAL:  [ ]  joint pain   [ ]  joint swelling Valu.Nieves ] leg swelling GASTROINTESTINAL: [ ]   blood in stool  [ ]   hematemesis GENITOURINARY:  [ ]   dysuria  [ ]   hematuria PSYCHIATRIC:  [ ]  history of major depression INTEGUMENTARY:  [ ]  rashes  [ ]  ulcers CONSTITUTIONAL:  [ ]  fever   [ ]  chills  PHYSICAL EXAM:  Filed Vitals:   04/09/15 0100 04/09/15 0138 04/09/15 0207 04/09/15 0623  BP: 145/101 146/96 148/94 132/97  Pulse: 92 91 93 83  Temp:   98.2 F (36.8 C) 97.9 F (36.6 C)  TempSrc:   Oral Oral  Resp: 19 12 16 14   Weight:   89 lb 1.6 oz (40.415 kg)   SpO2: 100% 100% 100% 99%   Body mass index is 17.4 kg/(m^2). GENERAL: The patient is a well-nourished female, in no acute distress. The vital signs are documented above. CARDIOVASCULAR: There is a regular rate and rhythm. She has palpable radial pulses. PULMONARY: There is good air exchange bilaterally without wheezing or rales. ABDOMEN: Soft and non-tender with normal pitched bowel sounds.  MUSCULOSKELETAL: There are no major deformities or cyanosis. NEUROLOGIC: No focal weakness or paresthesias are detected. SKIN: There are no ulcers or rashes  noted. PSYCHIATRIC: The patient has a normal affect.  DATA:  Lab Results  Component Value Date   WBC 6.7 04/08/2015   HGB 6.8* 04/08/2015   HCT 20.0* 04/08/2015   MCV 80.4 04/08/2015   PLT 131* 04/08/2015   Lab Results  Component Value Date   NA 135 04/08/2015   K 5.2* 04/08/2015   CL 108 04/08/2015   CO2 14* 04/08/2015   Lab Results  Component Value Date   CREATININE >18.00* 04/08/2015   Lab Results  Component Value Date   INR 0.83 04/03/2011   MEDICAL ISSUES:  CHRONIC KIDNEY DISEASE: I have ordered a vein mapping and also an arterial Doppler study to evaluate her for possible new access. Given the problems she had in the left arm we would probably consider access in the right arm. She appears to have very small veins and could possibly require a AV graft. I'll make further recommendations pending the results of her preoperative workup.   Deitra Mayo [facility] [place]: [phone]

## 2015-04-09 NOTE — ED Provider Notes (Signed)
CSN: [id number]     Arrival date & time 04/08/15  1955 History   First MD Initiated Contact with Patient 04/08/15 2154     Chief Complaint  Patient presents with  . Constipation     (Consider location/radiation/quality/duration/timing/severity/associated sxs/prior Treatment) HPI Comments:  29 year old female with history of renal failure presents with multiple different symptoms worsening the past week. Patient has not felt herself for the past 4 months. Spanish interpreter used. Patient has had decreased stool output/constipation for the past week in mild discomfort. No blood in the stools. Patient has had general fatigue for the past 4 months. Patient had dialysis last over a year ago. Patient missed her last nephrology appointment. Patient had glomerulonephritis and UTI history. Patient has mild urinary symptoms.  Patient is a 29 y.o. female presenting with constipation. The history is provided by the patient.  Constipation Associated symptoms: abdominal pain and dysuria   Associated symptoms: no back pain, no fever and no vomiting     Past Medical History  Diagnosis Date  . Glomerulonephritis   . Thrombocytopenia 2014  . PONV (postoperative nausea and vomiting)     was on dialysis i during pregnancy2014  . Hypertension    Past Surgical History  Procedure Laterality Date  . Cesarean section      2009  . Tubal ligation  2014  . Av fistula placement Left   . Ligation of arteriovenous  fistula Left 11/05/2014    Procedure: LIGATION OF LEFT ARM  BRACHIO-CEPHALIC ARTERIOVENOUS  FISTULA ,& REPAIR OF BRACHIAL ARTERY.;  Surgeon: Mal [name], MD;  Location: [facility];  Service: Vascular;  Laterality: Left;   No family history on file. History  Substance Use Topics  . Smoking status: Never Smoker   . Smokeless tobacco: Never Used  . Alcohol Use: No   OB History    Gravida Para Term Preterm AB TAB SAB Ectopic Multiple Living   3 3 1 2      2      Review of Systems   Constitutional: Positive for fatigue. Negative for fever and chills.  HENT: Negative for congestion.   Eyes: Negative for visual disturbance.  Respiratory: Negative for shortness of breath.   Cardiovascular: Negative for chest pain.  Gastrointestinal: Positive for abdominal pain and constipation. Negative for vomiting.  Genitourinary: Positive for dysuria. Negative for flank pain.  Musculoskeletal: Negative for back pain, neck pain and neck stiffness.  Skin: Negative for rash.  Neurological: Negative for light-headedness and headaches.      Allergies  Review of patient's allergies indicates no known allergies.  Home Medications   Prior to Admission medications   Medication Sig Start Date End Date Taking? Authorizing Provider  oxyCODONE (ROXICODONE) 5 MG immediate release tablet Take 1 tablet (5 mg total) by mouth every 6 (six) hours as needed. Patient not taking: Reported on 04/08/2015 11/05/14   Samantha J Rhyne, PA-C   BP 146/96 mmHg  Pulse 91  Temp(Src) 98.4 F (36.9 C) (Oral)  Resp 12  SpO2 100% Physical Exam  Constitutional: She is oriented to person, place, and time. She appears well-developed and well-nourished.  HENT:  Head: Normocephalic and atraumatic.  Dry mm  Eyes: Conjunctivae are normal. Right eye exhibits no discharge. Left eye exhibits no discharge.  Neck: Normal range of motion. Neck supple. No tracheal deviation present.  Cardiovascular: Normal rate and regular rhythm.   Pulmonary/Chest: Effort normal and breath sounds normal.  Abdominal: Soft. She exhibits no distension. There is no tenderness. There  is no guarding.  Musculoskeletal: She exhibits no edema.  Neurological: She is alert and oriented to person, place, and time.  Skin: Skin is warm. No rash noted.  Psychiatric: She has a normal mood and affect.  Nursing note and vitals reviewed.   ED Course  Procedures (including critical care time) Labs Review Labs Reviewed  CBC WITH  DIFFERENTIAL/PLATELET - Abnormal; Notable for the following:    RBC 2.14 (*)    Hemoglobin 5.8 (*)    HCT 17.2 (*)    Platelets 131 (*)    All other components within normal limits  COMPREHENSIVE METABOLIC PANEL - Abnormal; Notable for the following:    Potassium 5.2 (*)    CO2 14 (*)    Glucose, Bld 102 (*)    BUN 142 (*)    Creatinine, Ser 17.14 (*)    Calcium 4.9 (*)    GFR calc non Af Amer 2 (*)    GFR calc Af Amer 3 (*)    All other components within normal limits  URINALYSIS, ROUTINE W REFLEX MICROSCOPIC (NOT AT [facility]) - Abnormal; Notable for the following:    APPearance CLOUDY (*)    Glucose, UA 100 (*)    Hgb urine dipstick MODERATE (*)    Protein, ur >300 (*)    Leukocytes, UA MODERATE (*)    All other components within normal limits  URINE MICROSCOPIC-ADD ON - Abnormal; Notable for the following:    Squamous Epithelial / LPF FEW (*)    Bacteria, UA MANY (*)    All other components within normal limits  LACTATE DEHYDROGENASE - Abnormal; Notable for the following:    LDH 246 (*)    All other components within normal limits  I-STAT CG4 LACTIC ACID, ED - Abnormal; Notable for the following:    Lactic Acid, Venous <0.30 (*)    All other components within normal limits  I-STAT CHEM 8, ED - Abnormal; Notable for the following:    Potassium 5.2 (*)    BUN 136 (*)    Creatinine, Ser >18.00 (*)    Calcium, Ion 0.65 (*)    Hemoglobin 6.8 (*)    HCT 20.0 (*)    All other components within normal limits  LACTIC ACID, PLASMA  MAGNESIUM  HAPTOGLOBIN  CALCIUM, IONIZED  CBC  BASIC METABOLIC PANEL  POC URINE PREG, ED  POC OCCULT BLOOD, ED  TYPE AND SCREEN  ABO/RH    Imaging Review No results found.   EKG Interpretation None      MDM   Final diagnoses:  Hypocalcemia  UTI (lower urinary tract infection)  Anemia, unspecified anemia type  Hyperkalemia  Acute on chronic renal failure    Patient presents with multiple different worsening symptoms the past few  weeks. On exam overall well-appearing considering acute renal failure, hyperkalemia, hypocalcemia. Discussed the case with nephrology and interventional radiology who will see the patient to arrange dialysis early this morning. Patient updated. EKG pending  The patients results and plan were reviewed and discussed.   Any x-rays performed were personally reviewed by myself.   Differential diagnosis were considered with the presenting HPI.  Medications  cefTRIAXone (ROCEPHIN) 1 g in dextrose 5 % 50 mL IVPB (0 g Intravenous Stopped 04/09/15 0010)  sodium chloride 0.9 % bolus 1,000 mL (1,000 mLs Intravenous New Bag/Given 04/08/15 2329)    Filed Vitals:   04/09/15 0030 04/09/15 0045 04/09/15 0100 04/09/15 0138  BP: 160/70 160/109 145/101 146/96  Pulse: 102 91 92 91  Temp:      TempSrc:      Resp: 15 18 19 12   SpO2: 100% 98% 100% 100%    Final diagnoses:  Hypocalcemia  UTI (lower urinary tract infection)  Anemia, unspecified anemia type  Hyperkalemia  Acute on chronic renal failure    Admission/ observation were discussed with the admitting physician, patient and/or family and they are comfortable with the plan.     Elnora Morrison, MD 04/09/15 [phone]

## 2015-04-09 NOTE — Progress Notes (Signed)
Pt came back to unit from HD per bed accompanied by HD RN Earnest Bailey, alert, oriented X4, Spanish-speaking. VSS, received 2 units of blood, Aranesp and Vit. D from HD. Not in any form of respiratory distress, denies having any pain as of this time. Will continue to monitor.

## 2015-04-09 NOTE — Progress Notes (Signed)
CRITICAL VALUE ALERT  Critical value received:  Hgb 5.4  Date of notification:  04/09/2015  Time of notification:  0830  Critical value read back:Yes.    Nurse who received alert:  Marijean Heath, BSN, RN   MD notified (1st page):  Dr. Joelyn Oms   Time of first page:  Writer notified Dr. Joelyn Oms verbally in dictation room; per MD pt will receive blood in HD after cath placement   MD notified (2nd page):  Time of second page:  Responding MD:  See above   Time MD responded:  See above

## 2015-04-09 NOTE — H&P (Signed)
[facility] History and Physical  Shanda Yusko [id number] DOB: 1986/05/17 DOA: 04/08/2015  Referring physician: EDP PCP: Pcp Not In System   Chief Complaint: Abdominal pain   HPI: Karla Garner is a 29 y.o. female with history of glomerulonephritis.  She had been on dialysis previously since at least 2014.  She had a ligation of an AV fistula due to pain in January of this year.  It seems that the patient apparently had been off of dialysis for the past year and a half or so with baseline creatinine of 6.9.  It seems like she has not been following up with nephrology frequently either.  Today she presents to the ED with 1 week history of low abdominal pain constipation, and not feeling well.  History is somewhat difficult due to language barrier.  Review of Systems: Systems reviewed.  As above, otherwise negative  Past Medical History  Diagnosis Date  . Glomerulonephritis   . Thrombocytopenia 2014  . PONV (postoperative nausea and vomiting)     was on dialysis i during pregnancy2014  . Hypertension    Past Surgical History  Procedure Laterality Date  . Cesarean section      2009  . Tubal ligation  2014  . Av fistula placement Left   . Ligation of arteriovenous  fistula Left 11/05/2014    Procedure: LIGATION OF LEFT ARM  BRACHIO-CEPHALIC ARTERIOVENOUS  FISTULA ,& REPAIR OF BRACHIAL ARTERY.;  Surgeon: Mal [name], MD;  Location: [facility];  Service: Vascular;  Laterality: Left;   Social History:  reports that she has never smoked. She has never used smokeless tobacco. She reports that she does not drink alcohol or use illicit drugs.  No Known Allergies  No family history on file.   Prior to Admission medications   Medication Sig Start Date End Date Taking? Authorizing Provider  oxyCODONE (ROXICODONE) 5 MG immediate release tablet Take 1 tablet (5 mg total) by mouth every 6 (six) hours as needed. Patient not taking: Reported on 04/08/2015 11/05/14    Gabriel Earing, PA-C   Physical Exam: Filed Vitals:   04/09/15 0000  BP: 149/104  Pulse: 96  Temp:   Resp: 13    BP 149/104 mmHg  Pulse 96  Temp(Src) 98.4 F (36.9 C) (Oral)  Resp 13  SpO2 100%  General Appearance:    Alert, oriented, no distress, appears stated age  Head:    Normocephalic, atraumatic  Eyes:    PERRL, EOMI, sclera non-icteric        Nose:   Nares without drainage or epistaxis. Mucosa, turbinates normal  Throat:   Moist mucous membranes. Oropharynx without erythema or exudate.  Neck:   Supple. No carotid bruits.  No thyromegaly.  No lymphadenopathy.   Back:     No CVA tenderness, no spinal tenderness  Lungs:     Clear to auscultation bilaterally, without wheezes, rhonchi or rales  Chest wall:    No tenderness to palpitation  Heart:    Regular rate and rhythm without murmurs, gallops, rubs  Abdomen:     Soft, non-tender, nondistended, normal bowel sounds, no organomegaly  Genitalia:    deferred  Rectal:    deferred  Extremities:   No clubbing, cyanosis or edema.  Pulses:   2+ and symmetric all extremities  Skin:   Skin color, texture, turgor normal, no rashes or lesions  Lymph nodes:   Cervical, supraclavicular, and axillary nodes normal  Neurologic:   CNII-XII intact. Normal strength, sensation  and reflexes      throughout    Labs on Admission:  Basic Metabolic Panel:  Recent Labs Lab 04/08/15 2024 04/08/15 2318 04/08/15 2322  NA 135  --  135  K 5.2*  --  5.2*  CL 107  --  108  CO2 14*  --   --   GLUCOSE 102*  --  89  BUN 142*  --  136*  CREATININE 17.14*  --  >18.00*  CALCIUM 4.9*  --   --   MG  --  2.1  --    Liver Function Tests:  Recent Labs Lab 04/08/15 2024  AST 17  ALT 17  ALKPHOS 73  BILITOT 0.6  PROT 7.1  ALBUMIN 3.8   No results for input(s): LIPASE, AMYLASE in the last 168 hours. No results for input(s): AMMONIA in the last 168 hours. CBC:  Recent Labs Lab 04/08/15 2024 04/08/15 2322  WBC 6.7  --   NEUTROABS  4.8  --   HGB 5.8* 6.8*  HCT 17.2* 20.0*  MCV 80.4  --   PLT 131*  --    Cardiac Enzymes: No results for input(s): CKTOTAL, CKMB, CKMBINDEX, TROPONINI in the last 168 hours.  BNP (last 3 results) No results for input(s): PROBNP in the last 8760 hours. CBG: No results for input(s): GLUCAP in the last 168 hours.  Radiological Exams on Admission: No results found.  EKG: Independently reviewed.  Assessment/Plan Principal Problem:   Acute on chronic renal failure Active Problems:   CKD (chronic kidney disease) stage 5, GFR less than 15 ml/min   Anemia of chronic kidney failure   ESRD (end stage renal disease)   1. Progression of CKD stage 5, now ESRD - Patient has ESRD, her creatinine is 18 (up from 6.9 baseline over the past year), and her BUN is in the 130s. 1. Nephrology consulted, they agree that patient will need dialysis, plan to start this tomorrow morning after 2. IR sees the patient and puts in access site 2. Anemia of CKD - 1. May need transfusion with dialysis 2. Likely will need epogyn to be administered by nephrology    Code Status: Full Code  Family Communication: No family in room Disposition Plan: Admit to inpatient   Time spent: 70 min  Welma Mccombs M. [facility] Pager [phone]  If 7AM-7PM, please contact the day team taking care of the patient [email or web address] Password [facility] 04/09/2015, 12:52 AM

## 2015-04-09 NOTE — Procedures (Signed)
RIJV temp HD cath SVC RA No comp/EBL

## 2015-04-10 ENCOUNTER — Inpatient Hospital Stay (HOSPITAL_COMMUNITY): Payer: Self-pay

## 2015-04-10 LAB — CBC
HCT: 28 % — ABNORMAL LOW (ref 36.0–46.0)
HEMOGLOBIN: 9.7 g/dL — AB (ref 12.0–15.0)
MCH: 27.8 pg (ref 26.0–34.0)
MCHC: 34.6 g/dL (ref 30.0–36.0)
MCV: 80.2 fL (ref 78.0–100.0)
Platelets: 123 10*3/uL — ABNORMAL LOW (ref 150–400)
RBC: 3.49 MIL/uL — ABNORMAL LOW (ref 3.87–5.11)
RDW: 14.2 % (ref 11.5–15.5)
WBC: 5.8 10*3/uL (ref 4.0–10.5)

## 2015-04-10 LAB — TYPE AND SCREEN
ABO/RH(D): O POS
Antibody Screen: NEGATIVE
UNIT DIVISION: 0
Unit division: 0

## 2015-04-10 LAB — RENAL FUNCTION PANEL
Albumin: 2.9 g/dL — ABNORMAL LOW (ref 3.5–5.0)
Anion gap: 10 (ref 5–15)
BUN: 77 mg/dL — ABNORMAL HIGH (ref 6–20)
CO2: 21 mmol/L — ABNORMAL LOW (ref 22–32)
Calcium: 6.3 mg/dL — CL (ref 8.9–10.3)
Chloride: 104 mmol/L (ref 101–111)
Creatinine, Ser: 11.11 mg/dL — ABNORMAL HIGH (ref 0.44–1.00)
GFR calc Af Amer: 5 mL/min — ABNORMAL LOW (ref >60)
GFR calc non Af Amer: 4 mL/min — ABNORMAL LOW (ref >60)
Glucose, Bld: 102 mg/dL — ABNORMAL HIGH (ref 65–99)
POTASSIUM: 3.4 mmol/L — AB (ref 3.5–5.1)
Phosphorus: 5.9 mg/dL — ABNORMAL HIGH (ref 2.5–4.6)
SODIUM: 135 mmol/L (ref 135–145)

## 2015-04-10 LAB — HAPTOGLOBIN: HAPTOGLOBIN: 191 mg/dL (ref 34–200)

## 2015-04-10 LAB — HEPATITIS B CORE ANTIBODY, TOTAL: Hep B Core Total Ab: NEGATIVE

## 2015-04-10 LAB — HEPATITIS B SURFACE ANTIGEN: HEP B S AG: NEGATIVE

## 2015-04-10 LAB — CALCIUM, IONIZED: Calcium, Ionized, Serum: <3 mg/dL — ABNORMAL LOW (ref 4.5–5.6)

## 2015-04-10 LAB — HEPATITIS B SURFACE ANTIBODY,QUALITATIVE: Hep B S Ab: REACTIVE

## 2015-04-10 MED ORDER — CALCIUM CARBONATE ANTACID 500 MG PO CHEW: 800.0000 mg | CHEWABLE_TABLET | Freq: Three times a day (TID) | ORAL | Status: DC

## 2015-04-10 MED ORDER — AMLODIPINE BESYLATE 10 MG PO TABS
10.0000 mg | ORAL_TABLET | Freq: Every day | ORAL | Status: DC
  Administered 2015-04-10 – 2015-04-16 (×6): 10 mg via ORAL
  Filled 2015-04-10 (×7): qty 1

## 2015-04-10 NOTE — Progress Notes (Signed)
Upon assessment of the pt with NT [facility] taking vital signs, pt and NT were talking in Spanish and have mentioned that she had a "fall" while walking 25 days ago. Pt c/o occasional pulsating pain on the back of her neck and stated that 'she sustained it from the fall'. No fall history was mentioned per notes and report. Charge RN Ryanne made aware. MD notified. No new orders this time. Pt classified as a high fall risk. Low bed maintained, call light within reach. Bed alarm refused, but instructed pt to ask for help if unable to tolerate getting up from the bed. Will continue to monitor.

## 2015-04-10 NOTE — Progress Notes (Signed)
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  Subjective:  HD yesterday after placement of temp HD cath -- 2h, no UF, Qb 200, 3.5 Ca bath No new events Seen by [facility] Labs this AM pending  06/18 0701 - 06/19 0700 In: 1850 [P.O.:1080; Blood:670; IV Piggyback:100] Out: 0   Filed Weights   04/09/15 1845 04/09/15 2059 04/09/15 2134  Weight: 39.2 kg (86 lb 6.7 oz) 40.6 kg (89 lb 8.1 oz) 40.755 kg (89 lb 13.6 oz)    Scheduled Meds: . sodium chloride   Intravenous Once  . calcium carbonate  800 mg of elemental calcium Oral TID WC  . cefTRIAXone (ROCEPHIN)  IV  1 g Intravenous Q24H  . darbepoetin (ARANESP) injection - DIALYSIS  200 mcg Intravenous Q Sat-HD  . doxercalciferol  6 mcg Intravenous Q T,Th,Sa-HD   Continuous Infusions:  PRN Meds:.acetaminophen  Current Labs: reviewed    Physical Exam:  Blood pressure 169/107, pulse 82, temperature 98.9 F (37.2 C), temperature source Oral, resp. rate 18, weight 40.755 kg (89 lb 13.6 oz), SpO2 100 %, unknown if currently breastfeeding. GEN: NAD ENT: NCAT, fair dentition EYES: EOMI CV: RRR, no rub, nl s1s2 PULM: CTAB ABD: s/nt/nd SKIN: no rashes, R IJ temp cath EXT:no LEE  A/P 1. New ESRD 1. Primary is IgAN 2. Strongly encouraged patient to restart dialysis which she is willing to pursue.  3. Started 04/09/15 4. [facility]VS following and can place [facility] and AV access next week 5. I am hopeful that we can effectively communicate the critical importance of this and help her to mitigate any symptoms that leads to problems 6. CLIP Pt  7. Next HD 6/20: Qb 300, Ca 3.5 Bath, 1L UF 2. Anemia 1. LIkely 2/2 CKD 2. Transfused 2u PRBC with HD 3. Started ESA with HD -- 200 Aranesp qSat 4. TSAT 39%, rec 2u PRBC, no Fe needed 3. CKD-BMD 1. Hypocalcemia 2/2 impaired 1,25 Vit D generation: likely cause of constipation 1. Begin TUMS not at meal time 800mg  TID 2. Start VDRA Hectorol  6qTx 3. High Ca Bath with HD 2. HyperPhos: Add Tums qAC also 3. PTH pending 4. HTN/Vol:  1. not grossly overloaded, attempt some UF next Tx 2. Add amlodipine today 5. ? UTI on Ceftriaxone and U Cx pending; afebrile, nl WBC  Pearson Grippe MD 04/10/2015, 9:10 AM   Recent Labs Lab 04/08/15 2024 04/08/15 2322 04/09/15 0624 04/09/15 0809  NA 135 135 134*  --   K 5.2* 5.2* 5.2*  --   CL 107 108 105  --   CO2 14*  --  11*  --   GLUCOSE 102* 89 89  --   BUN 142* 136* 146*  --   CREATININE 17.14* >18.00* 17.21*  --   CALCIUM 4.9*  --  5.1*  --   PHOS  --   --   --  8.4*    Recent Labs Lab 04/08/15 2024 04/08/15 2322 04/09/15 0624  WBC 6.7  --  6.9  NEUTROABS 4.8  --   --   HGB 5.8* 6.8* 5.4*  HCT 17.2* 20.0* 16.4*  MCV 80.4  --  82.8  PLT 131*  --  116*

## 2015-04-10 NOTE — Progress Notes (Signed)
[facility] PROGRESS NOTE  Karla Garner [id number] DOB: Feb 29, 1986 DOA: 04/08/2015 PCP: Pcp Not In System  Assessment/Plan: 1. New ESRD- patient started on hemodialysis, vascular surgery following for AV access next week. 2. ? UTI- Patient had abnormal UA and was started on ceftriaxone for ? Uti, no urine culture obtained in ED. She has been afebrile with normal wbc, will discontinue ceftriaxone. 3. Anemia- secondary to CKD, s/p two units PRBC. Hb is 9.7 today.  Code Status: Full code Family Communication: *No family at bedside Disposition Plan: Home when medically stable   Consultants:  Nephrology  Procedures:  None  Antibiotics:  None  HPI/Subjective: 29 y.o. female with history of glomerulonephritis. She had been on dialysis previously since at least 2014. She had a ligation of an AV fistula due to pain in January of this year. It seems that the patient apparently had been off of dialysis for the past year and a half or so with baseline creatinine of 6.9. It seems like she has not been following up with nephrology. Patient started on hemodialysis.  Objective: Filed Vitals:   04/10/15 1000  BP: 158/97  Pulse: 79  Temp: 98.5 F (36.9 C)  Resp: 18    Intake/Output Summary (Last 24 hours) at 04/10/15 1447 Last data filed at 04/10/15 0900  Gross per 24 hour  Intake   1970 ml  Output      0 ml  Net   1970 ml   Filed Weights   04/09/15 1845 04/09/15 2059 04/09/15 2134  Weight: 39.2 kg (86 lb 6.7 oz) 40.6 kg (89 lb 8.1 oz) 40.755 kg (89 lb 13.6 oz)    Exam:   General:  Appear in no acute distress  Cardiovascular: S1S2 RRR  Respiratory: Clear bilaterally, nontender  Abdomen: No edema  Musculoskeletal: No edema of the lower extremities  Data Reviewed: Basic Metabolic Panel:  Recent Labs Lab 04/08/15 2024 04/08/15 2318 04/08/15 2322 04/09/15 0624 04/09/15 0809 04/10/15 0850  NA 135  --  135 134*  --  135  K 5.2*  --  5.2*  5.2*  --  3.4*  CL 107  --  108 105  --  104  CO2 14*  --   --  11*  --  21*  GLUCOSE 102*  --  89 89  --  102*  BUN 142*  --  136* 146*  --  77*  CREATININE 17.14*  --  >18.00* 17.21*  --  11.11*  CALCIUM 4.9*  --   --  5.1*  --  6.3*  MG  --  2.1  --   --   --   --   PHOS  --   --   --   --  8.4* 5.9*   Liver Function Tests:  Recent Labs Lab 04/08/15 2024 04/10/15 0850  AST 17  --   ALT 17  --   ALKPHOS 73  --   BILITOT 0.6  --   PROT 7.1  --   ALBUMIN 3.8 2.9*   No results for input(s): LIPASE, AMYLASE in the last 168 hours. No results for input(s): AMMONIA in the last 168 hours. CBC:  Recent Labs Lab 04/08/15 2024 04/08/15 2322 04/09/15 0624 04/10/15 0850  WBC 6.7  --  6.9 5.8  NEUTROABS 4.8  --   --   --   HGB 5.8* 6.8* 5.4* 9.7*  HCT 17.2* 20.0* 16.4* 28.0*  MCV 80.4  --  82.8 80.2  PLT 131*  --  116* 123*   Cardiac Enzymes: No results for input(s): CKTOTAL, CKMB, CKMBINDEX, TROPONINI in the last 168 hours. BNP (last 3 results) No results for input(s): BNP in the last 8760 hours.  ProBNP (last 3 results) No results for input(s): PROBNP in the last 8760 hours.  CBG: No results for input(s): GLUCAP in the last 168 hours.  No results found for this or any previous visit (from the past 240 hour(s)).   Studies: Ir Fluoro Guide Cv Line Right  04/09/2015   CLINICAL DATA:  Acute renal failure  EXAM: IR RIGHT FLOURO GUIDE CV LINE; IR ULTRASOUND GUIDANCE VASC ACCESS RIGHT  FLUOROSCOPY TIME:  12 seconds  MEDICATIONS AND MEDICAL HISTORY: Versed 0 mg, Fentanyl 25 mcg.  Additional Medications: None.  ANESTHESIA/SEDATION: Moderate sedation time: 10 minutes  CONTRAST:  None  PROCEDURE: The procedure, risks, benefits, and alternatives were explained to the patient. Questions regarding the procedure were encouraged and answered. The patient understands and consents to the procedure.  The right neck was prepped with Betadine in a sterile fashion, and a sterile drape was  applied covering the operative field. A sterile gown and sterile gloves were used for the procedure.  Under sonographic guidance, a micropuncture needle was inserted into the right internal jugular vein and removed over a 018 wire which was up sized to a 3 J. The dilator followed by the temporary catheter were advanced over the wire to the cavoatrial junction. It was flushed and sewn in place. Heparinized saline was instilled.  FINDINGS: Tip of the temporary dialysis catheter is at the cavoatrial junction.  COMPLICATIONS: None  IMPRESSION: Successful right internal jugular temporary dialysis catheter placement with its tip at the cavoatrial junction.   Electronically Signed   By: Marybelle Killings M.D.   On: 04/09/2015 10:20   Ir US Guide Vasc Access Right  04/09/2015   CLINICAL DATA:  Acute renal failure  EXAM: IR RIGHT FLOURO GUIDE CV LINE; IR ULTRASOUND GUIDANCE VASC ACCESS RIGHT  FLUOROSCOPY TIME:  12 seconds  MEDICATIONS AND MEDICAL HISTORY: Versed 0 mg, Fentanyl 25 mcg.  Additional Medications: None.  ANESTHESIA/SEDATION: Moderate sedation time: 10 minutes  CONTRAST:  None  PROCEDURE: The procedure, risks, benefits, and alternatives were explained to the patient. Questions regarding the procedure were encouraged and answered. The patient understands and consents to the procedure.  The right neck was prepped with Betadine in a sterile fashion, and a sterile drape was applied covering the operative field. A sterile gown and sterile gloves were used for the procedure.  Under sonographic guidance, a micropuncture needle was inserted into the right internal jugular vein and removed over a 018 wire which was up sized to a 3 J. The dilator followed by the temporary catheter were advanced over the wire to the cavoatrial junction. It was flushed and sewn in place. Heparinized saline was instilled.  FINDINGS: Tip of the temporary dialysis catheter is at the cavoatrial junction.  COMPLICATIONS: None  IMPRESSION: Successful  right internal jugular temporary dialysis catheter placement with its tip at the cavoatrial junction.   Electronically Signed   By: Marybelle Killings M.D.   On: 04/09/2015 10:20    Scheduled Meds: . sodium chloride   Intravenous Once  . amLODipine  10 mg Oral Daily  . calcium carbonate  800 mg of elemental calcium Oral TID WC  . cefTRIAXone (ROCEPHIN)  IV  1 g Intravenous Q24H  . darbepoetin (ARANESP) injection - DIALYSIS  200 mcg Intravenous Q Sat-HD  . doxercalciferol  6 mcg Intravenous Q T,Th,Sa-HD   Continuous Infusions:   Principal Problem:   Acute on chronic renal failure Active Problems:   CKD (chronic kidney disease) stage 5, GFR less than 15 ml/min   Anemia of chronic kidney failure   ESRD (end stage renal disease)    Time spent: 20 min    [facility]  [facility] Pager [phone]*. If 7PM-7AM, please contact night-coverage at [email or web address], password [email or web address] 04/10/2015, 2:47 PM  LOS: 1 day

## 2015-04-11 ENCOUNTER — Inpatient Hospital Stay (HOSPITAL_COMMUNITY): Payer: Self-pay

## 2015-04-11 DIAGNOSIS — Z01810 Encounter for preprocedural cardiovascular examination: Secondary | ICD-10-CM

## 2015-04-11 LAB — RENAL FUNCTION PANEL
ALBUMIN: 3.5 g/dL (ref 3.5–5.0)
Anion gap: 15 (ref 5–15)
BUN: 92 mg/dL — AB (ref 6–20)
CHLORIDE: 102 mmol/L (ref 101–111)
CO2: 20 mmol/L — AB (ref 22–32)
Calcium: 6.6 mg/dL — ABNORMAL LOW (ref 8.9–10.3)
Creatinine, Ser: 12.33 mg/dL — ABNORMAL HIGH (ref 0.44–1.00)
GFR calc non Af Amer: 4 mL/min — ABNORMAL LOW (ref >60)
GFR, EST AFRICAN AMERICAN: 4 mL/min — AB (ref >60)
Glucose, Bld: 134 mg/dL — ABNORMAL HIGH (ref 65–99)
POTASSIUM: 3.4 mmol/L — AB (ref 3.5–5.1)
Phosphorus: 5.1 mg/dL — ABNORMAL HIGH (ref 2.5–4.6)
Sodium: 137 mmol/L (ref 135–145)

## 2015-04-11 LAB — CBC
HCT: 30.4 % — ABNORMAL LOW (ref 36.0–46.0)
HEMOGLOBIN: 10.4 g/dL — AB (ref 12.0–15.0)
MCH: 27.6 pg (ref 26.0–34.0)
MCHC: 34.2 g/dL (ref 30.0–36.0)
MCV: 80.6 fL (ref 78.0–100.0)
Platelets: 129 10*3/uL — ABNORMAL LOW (ref 150–400)
RBC: 3.77 MIL/uL — AB (ref 3.87–5.11)
RDW: 14.2 % (ref 11.5–15.5)
WBC: 6.4 10*3/uL (ref 4.0–10.5)

## 2015-04-11 MED ORDER — CYCLOBENZAPRINE HCL 10 MG PO TABS
10.0000 mg | ORAL_TABLET | Freq: Three times a day (TID) | ORAL | Status: DC
  Administered 2015-04-11 – 2015-04-16 (×15): 10 mg via ORAL
  Filled 2015-04-11 (×18): qty 1

## 2015-04-11 NOTE — Progress Notes (Signed)
VASCULAR LAB PRELIMINARY  PRELIMINARY  PRELIMINARY  PRELIMINARY   Upper extremity Doppler completed.    Preliminary report:  Bilateral brachial, radial, and ulnar arterial waveforms appear normal.   Aaronmichael Brumbaugh, RVT 04/11/2015, 12:54 PM

## 2015-04-11 NOTE — Progress Notes (Signed)
[facility] PROGRESS NOTE  Karla Garner [id number] DOB: 07-28-1986 DOA: 04/08/2015 PCP: Pcp Not In System  Assessment/Plan: 1. New ESRD- patient started on hemodialysis, vascular surgery following for AV access next week. 2. ? UTI- Patient had abnormal UA and was started on ceftriaxone for ? Uti, no urine culture obtained in ED. She has been afebrile with normal wbc, ceftriaxone has been discontinued. 3. Neck pain- s/p fall prior to coming to the hospital, will check CT head and neck. Start Flexeril 10 mg po TID for muscle spasm. 4. Anemia- secondary to CKD, s/p two units PRBC. Hb is 9.7 today.  Code Status: Full code Family Communication: *No family at bedside Disposition Plan: Home when medically stable   Consultants:  Nephrology  Procedures:  None  Antibiotics:  None  HPI/Subjective: 29 y.o. female with history of glomerulonephritis. She had been on dialysis previously since at least 2014. She had a ligation of an AV fistula due to pain in January of this year. It seems that the patient apparently had been off of dialysis for the past year and a half or so with baseline creatinine of 6.9. It seems like she has not been following up with nephrology. Patient started on hemodialysis.  This morning patient complains of neck pain, she fell few days ago before coming to hospital while coming downstairs, and hit her head and neck on the floor. She complains of headache and left sided neck pain, on movement.Also complains of numbness of the left arm.  Objective: Filed Vitals:   04/11/15 0900  BP: 149/104  Pulse: 97  Temp: 98.7 F (37.1 C)  Resp: 18    Intake/Output Summary (Last 24 hours) at 04/11/15 1420 Last data filed at 04/11/15 1358  Gross per 24 hour  Intake    460 ml  Output    800 ml  Net   -340 ml   Filed Weights   04/09/15 2059 04/09/15 2134 04/10/15 2019  Weight: 40.6 kg (89 lb 8.1 oz) 40.755 kg (89 lb 13.6 oz) 45.4 kg (100 lb 1.4 oz)     Exam:   General:  Appear in no acute distress  Cardiovascular: S1S2 RRR  Respiratory: Clear bilaterally, nontender  Abdomen: No edema  Musculoskeletal: Neck-  left side paravertebral tenderness to palpation  Data Reviewed: Basic Metabolic Panel:  Recent Labs Lab 04/08/15 2024 04/08/15 2318 04/08/15 2322 04/09/15 0624 04/09/15 0809 04/10/15 0850  NA 135  --  135 134*  --  135  K 5.2*  --  5.2* 5.2*  --  3.4*  CL 107  --  108 105  --  104  CO2 14*  --   --  11*  --  21*  GLUCOSE 102*  --  89 89  --  102*  BUN 142*  --  136* 146*  --  77*  CREATININE 17.14*  --  >18.00* 17.21*  --  11.11*  CALCIUM 4.9*  --   --  5.1*  --  6.3*  MG  --  2.1  --   --   --   --   PHOS  --   --   --   --  8.4* 5.9*   Liver Function Tests:  Recent Labs Lab 04/08/15 2024 04/10/15 0850  AST 17  --   ALT 17  --   ALKPHOS 73  --   BILITOT 0.6  --   PROT 7.1  --   ALBUMIN 3.8 2.9*   No results for  input(s): LIPASE, AMYLASE in the last 168 hours. No results for input(s): AMMONIA in the last 168 hours. CBC:  Recent Labs Lab 04/08/15 2024 04/08/15 2322 04/09/15 0624 04/10/15 0850  WBC 6.7  --  6.9 5.8  NEUTROABS 4.8  --   --   --   HGB 5.8* 6.8* 5.4* 9.7*  HCT 17.2* 20.0* 16.4* 28.0*  MCV 80.4  --  82.8 80.2  PLT 131*  --  116* 123*   Cardiac Enzymes: No results for input(s): CKTOTAL, CKMB, CKMBINDEX, TROPONINI in the last 168 hours. BNP (last 3 results) No results for input(s): BNP in the last 8760 hours.  ProBNP (last 3 results) No results for input(s): PROBNP in the last 8760 hours.  CBG: No results for input(s): GLUCAP in the last 168 hours.  Recent Results (from the past 240 hour(s))  Urine culture     Status: None (Preliminary result)   Collection Time: 04/10/15  1:52 PM  Result Value Ref Range Status   Specimen Description URINE, RANDOM  Final   Special Requests NONE  Final   Culture CULTURE REINCUBATED FOR BETTER GROWTH  Final   Report Status PENDING   Incomplete     Studies: Ct Head Wo Contrast  04/11/2015   CLINICAL DATA:  [place] approximately 25 days ago, back of the head pain, neck pain  EXAM: CT HEAD WITHOUT CONTRAST  CT CERVICAL SPINE WITHOUT CONTRAST  TECHNIQUE: Multidetector CT imaging of the head and cervical spine was performed following the standard protocol without intravenous contrast. Multiplanar CT image reconstructions of the cervical spine were also generated.  COMPARISON:  None.  FINDINGS: CT HEAD FINDINGS  No skull fracture is noted. Paranasal sinuses and mastoid air cells are unremarkable.  No hydrocephalus. No intracranial hemorrhage, mass effect or midline shift.  No acute cortical infarction. No mass lesion is noted on this unenhanced scan. The gray and white-matter differentiation is preserved.  CT CERVICAL SPINE FINDINGS  Axial images of the cervical spine shows no acute fracture or subluxation. Alignment, disc spaces and vertebral body heights are preserved. There is no pneumothorax in visualized lung apices.  Computer processed images shows no acute fracture or subluxation. No prevertebral soft tissue swelling. Cervical airway is patent.  IMPRESSION: 1. No acute intracranial abnormality. 2. No cervical spine acute fracture or subluxation.   Electronically Signed   By: Lahoma Crocker M.D.   On: 04/11/2015 11:22   Ct Cervical Spine Wo Contrast  04/11/2015   CLINICAL DATA:  [place] approximately 25 days ago, back of the head pain, neck pain  EXAM: CT HEAD WITHOUT CONTRAST  CT CERVICAL SPINE WITHOUT CONTRAST  TECHNIQUE: Multidetector CT imaging of the head and cervical spine was performed following the standard protocol without intravenous contrast. Multiplanar CT image reconstructions of the cervical spine were also generated.  COMPARISON:  None.  FINDINGS: CT HEAD FINDINGS  No skull fracture is noted. Paranasal sinuses and mastoid air cells are unremarkable.  No hydrocephalus. No intracranial hemorrhage, mass effect or midline shift.  No  acute cortical infarction. No mass lesion is noted on this unenhanced scan. The gray and white-matter differentiation is preserved.  CT CERVICAL SPINE FINDINGS  Axial images of the cervical spine shows no acute fracture or subluxation. Alignment, disc spaces and vertebral body heights are preserved. There is no pneumothorax in visualized lung apices.  Computer processed images shows no acute fracture or subluxation. No prevertebral soft tissue swelling. Cervical airway is patent.  IMPRESSION: 1. No acute intracranial abnormality.  2. No cervical spine acute fracture or subluxation.   Electronically Signed   By: Lahoma Crocker M.D.   On: 04/11/2015 11:22    Scheduled Meds: . sodium chloride   Intravenous Once  . amLODipine  10 mg Oral Daily  . calcium carbonate  800 mg of elemental calcium Oral TID WC  . cyclobenzaprine  10 mg Oral TID  . darbepoetin (ARANESP) injection - DIALYSIS  200 mcg Intravenous Q Sat-HD  . doxercalciferol  6 mcg Intravenous Q T,Th,Sa-HD   Continuous Infusions:   Principal Problem:   Acute on chronic renal failure Active Problems:   CKD (chronic kidney disease) stage 5, GFR less than 15 ml/min   Anemia of chronic kidney failure   ESRD (end stage renal disease)    Time spent: 20 min    [facility]  [facility] Pager [phone]*. If 7PM-7AM, please contact night-coverage at [email or web address], password [facility] 04/11/2015, 2:20 PM  LOS: 2 days

## 2015-04-11 NOTE — Progress Notes (Signed)
   VASCULAR SURGERY ASSESSMENT & PLAN:  * Vein map and arterial doppler studies are pending.   * I plan on AVF/AVG and Diatek Thursday if she is agreeable   SUBJECTIVE: Resting comfortably  PHYSICAL EXAM: Filed Vitals:   04/10/15 0422 04/10/15 1000 04/10/15 2019 04/11/15 0448  BP: 169/107 158/97 132/84 138/96  Pulse: 82 79 104 92  Temp: 98.9 F (37.2 C) 98.5 F (36.9 C) 98.2 F (36.8 C) 97.6 F (36.4 C)  TempSrc: Oral Oral Oral Oral  Resp: 18 18 16 18   Weight:   100 lb 1.4 oz (45.4 kg)   SpO2: 100% 100% 100% 100%   LABS: Lab Results  Component Value Date   WBC 5.8 04/10/2015   HGB 9.7* 04/10/2015   HCT 28.0* 04/10/2015   MCV 80.2 04/10/2015   PLT 123* 04/10/2015   Lab Results  Component Value Date   CREATININE 11.11* 04/10/2015   Lab Results  Component Value Date   INR 0.83 04/03/2011   Principal Problem:   Acute on chronic renal failure Active Problems:   CKD (chronic kidney disease) stage 5, GFR less than 15 ml/min   Anemia of chronic kidney failure   ESRD (end stage renal disease)  Gae Gallop [id number] 04/11/2015

## 2015-04-11 NOTE — Procedures (Signed)
I have personally attended this patient's dialysis session.   This is HD #2 Temp cath BFR 200/DFR 500 2.5 hour treatment planned Labs drawn pre HD are pending  Jamal Maes, MD [place] Pager 04/11/2015, 3:27 PM

## 2015-04-11 NOTE — Progress Notes (Signed)
Subjective:  HD yesterday after placement of temp HD cath -- 2h, no UF, Qb 200, 3.5 Ca bath No new events Seen by VVS with plans for permanent access on Thursday Labs this AM pending  06/19 0701 - 06/20 0700 In: 720 [P.O.:720] Out: -   Filed Weights   04/09/15 2059 04/09/15 2134 04/10/15 2019  Weight: 40.6 kg (89 lb 8.1 oz) 40.755 kg (89 lb 13.6 oz) 45.4 kg (100 lb 1.4 oz)    Physical Exam:  BP 149/104 mmHg  Pulse 97  Temp(Src) 98.7 F (37.1 C) (Oral)  Resp 18  Wt 45.4 kg (100 lb 1.4 oz)  SpO2 100%  GEN: NAD. Speaks minimal English ENT: NCAT EYES: Sclerae not icteric CV: RRR, no rub, nl s1s2 PULM: CTAB ABD: soft, non tender SKIN: no rashes, R IJ temp cath with dry dressing EXT:No edema, no asterixus   Scheduled Meds: . sodium chloride   Intravenous Once  . amLODipine  10 mg Oral Daily  . calcium carbonate  800 mg of elemental calcium Oral TID WC  . cyclobenzaprine  10 mg Oral TID  . darbepoetin (ARANESP) injection - DIALYSIS  200 mcg Intravenous Q Sat-HD  . doxercalciferol  6 mcg Intravenous Q T,Th,Sa-HD     PRN Meds:.acetaminophen   No labs today  Recent Labs Lab 04/08/15 2024 04/08/15 2322 04/09/15 0624 04/09/15 0809 04/10/15 0850  NA 135 135 134*  --  135  K 5.2* 5.2* 5.2*  --  3.4*  CL 107 108 105  --  104  CO2 14*  --  11*  --  21*  GLUCOSE 102* 89 89  --  102*  BUN 142* 136* 146*  --  77*  CREATININE 17.14* >18.00* 17.21*  --  11.11*  CALCIUM 4.9*  --  5.1*  --  6.3*  PHOS  --   --   --  8.4* 5.9*    Recent Labs Lab 04/08/15 2024 04/08/15 2322 04/09/15 0624 04/10/15 0850  WBC 6.7  --  6.9 5.8  NEUTROABS 4.8  --   --   --   HGB 5.8* 6.8* 5.4* 9.7*  HCT 17.2* 20.0* 16.4* 28.0*  MCV 80.4  --  82.8 80.2  PLT 131*  --  116* 123*    Background: 32F followed by Dr. 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 Last seen 09/2014 in our office, then lost to follow-up.  Apparently had some residual GFR. She presented to the emergency room with inability to have a bowel movement. She was found to have profound azotemia, anemia, mild hyperkalemia, hypocalcemia. Now ESRD admitted with uremia (BUN >140, creatinine>17), anemia (Hb 5.8), hypocalcemia (4.9). Dialysis initiated 04/09/15  A/P 1. New ESRD 1. Primary is IgAN 2. Strongly encouraged patient to restart dialysis which she is willing to pursue.  3. Started 04/09/15 and will get TMT today and tomorrow 4. [facility] following and can place [facility] and AV access Thursday of this week 5. I am hopeful that we can effectively communicate the critical importance of this and help her to mitigate any symptoms that leads to problems 6. CLIP Pt  - pending 7. Next HD today. No UF (no edema, clear lungs) 2. Anemia 1. LIkely 2/2 CKD 2. Transfused 2u PRBC with HD 3. Started ESA with HD -- 200 Aranesp qSat 4. TSAT 39%, rec 2u PRBC, no Fe needed 3. CKD-BMD 1. Hypocalcemia 2/2 impaired 1,25 Vit D generation: likely  cause of constipation 1. Began TUMS not at meal time 800mg  TID 2. Started VDRA Hectorol 6qTx 3. High Ca Bath with HD 2. HyperPhos: Add Tums qAC also 3. PTH pending (ordered - to be drawn at HD today) 4. HTN/Vol:  1. not grossly overloaded, attempt some UF next Tx 2. Added amlodipine 6/19 5. ? UTI afebrile, nl WBC; had ceftriaxone - no ATB now  Jamal Maes, MD [place] Pager 04/11/2015, 1:39 PM

## 2015-04-11 NOTE — Progress Notes (Signed)
Right  Upper Extremity Vein Map    Cephalic  Segment Diameter Depth Comment  1. Axilla 3.17mm mm   2. Mid upper arm 51mm mm   3. Above AC 3.102mm mm   4. In AC 46mm mm   5. Below AC 3.67mm mm Multiple branches  6. Mid forearm 3.62mm mm branch  7. Wrist 57mm mm    mm mm    mm mm    mm mm    Basilic  Segment Diameter Depth Comment  1. Axilla 36mm 54mm   2. Mid upper arm mm mm   3. Above AC 7.76mm 24mm   4. In AC 1.37mm 2.83mm   5. Below AC 1.76mm 1mm   6. Mid forearm 1.72mm 1.44mm   7. Wrist 1.64mm 70mm    mm mm    mm mm    mm mm   Left Upper Extremity Vein Map    Cephalic  Segment Diameter Depth Comment  1. Axilla 69mm mm   2. Mid upper arm 41mm mm thickening  3. Above AC 1.22mm mm   4. In AC mm mm thrombosis  5. Below AC mm mm   6. Mid forearm 3.25mm mm   7. Wrist 1.2mm mm    mm mm    mm mm    mm mm    Basilic  Segment Diameter Depth Comment  1. Axilla 6.51mm 5.49mm   2. Mid upper arm 5.20mm 66mm   3. Above AC 4.30mm 66mm   4. In [facility] 3.33mm 5.74mm   5. Below AC 1.13mm 2.35mm   6. Mid forearm 1.62mm 1.61mm   7. Wrist 1.42mm 1.74mm    mm mm    mm mm    mm mm

## 2015-04-12 LAB — PTH, INTACT AND CALCIUM
CALCIUM TOTAL (PTH): 8.2 mg/dL — AB (ref 8.7–10.2)
PTH: 275 pg/mL — ABNORMAL HIGH (ref 15–65)

## 2015-04-12 LAB — GLUCOSE, CAPILLARY: GLUCOSE-CAPILLARY: 76 mg/dL (ref 65–99)

## 2015-04-12 LAB — URINE CULTURE

## 2015-04-12 MED ORDER — DOXERCALCIFEROL 4 MCG/2ML IV SOLN
INTRAVENOUS | Status: AC
  Filled 2015-04-12: qty 4

## 2015-04-12 MED ORDER — CEFAZOLIN SODIUM 1-5 GM-% IV SOLN
1.0000 g | INTRAVENOUS | Status: DC
  Filled 2015-04-12: qty 50

## 2015-04-12 NOTE — Progress Notes (Signed)
04/12/2015 4:00 PM Hemodialysis Outpatient Note; This patient has a pending schedule at the [place] center on a Tuesday, Thursday and Saturday 2nd shift schedule. It is pending until a permanent access has been created, also we are waiting on a Medicaid application to be initiated on Wednesday June 22, at 9:30 AM. Thank you. Karla Garner

## 2015-04-12 NOTE — Procedures (Signed)
I have personally attended this patient's dialysis session.   HD #3 for new ESRD 4K bath pending labs BFR 250 DFR 500 3 hour TMT time today Next TMT on Thursday  Jamal Maes, MD [place] Pager 04/12/2015, 1:35 PM

## 2015-04-12 NOTE — Progress Notes (Signed)
[facility] PROGRESS NOTE  Karla Garner [id number] DOB: [birth year] DOA: 04/08/2015 PCP: Pcp Not In System  Assessment/Plan: 1. New ESRD- patient started on hemodialysis, vascular surgery following for AV access next week. 2. ?UTI- Patient had abnormal UA and was started on ceftriaxone for ? Uti, no urine culture obtained in ED. She has been afebrile with normal wbc, ceftriaxone has been discontinued. 3. Neck pain-  Improved, s/p fall prior to coming to the hospital, CT head and c spine negative, Started Flexeril 10 mg po TID for muscle spasm. 4. Anemia- secondary to CKD, s/p two units PRBC. Hb is 10.4 today.  Code Status: Full code Family Communication: *No family at bedside Disposition Plan: Home when medically stable   Consultants:  Nephrology  Procedures:  None  Antibiotics:  None  HPI/Subjective: 29 y.o. female with history of glomerulonephritis. She had been on dialysis previously since at least 2014. She had a ligation of an AV fistula due to pain in January of this year. It seems that the patient apparently had been off of dialysis for the past year and a half or so with baseline creatinine of 6.9. It seems like she has not been following up with nephrology. Patient started on hemodialysis.patient complained of neck pain, she fell few days ago before coming to hospital while coming downstairs, and hit her head and neck on the floor. She complains of headache and left sided neck pain, on movement.Also complains of numbness of the left arm. CT head and c spine were obtained, which were negative for acute abnormality.  Today she denies neck pain, feels better.  Objective: Filed Vitals:   04/12/15 0806  BP: 146/93  Pulse: 85  Temp: 98.1 F (36.7 C)  Resp: 17    Intake/Output Summary (Last 24 hours) at 04/12/15 1229 Last data filed at 04/12/15 0941  Gross per 24 hour  Intake    440 ml  Output   1700 ml  Net  -1260 ml   Filed Weights    04/10/15 2019 04/11/15 1415 04/11/15 1700  Weight: 45.4 kg (100 lb 1.4 oz) 41.3 kg (91 lb 0.8 oz) 41.3 kg (91 lb 0.8 oz)    Exam:   General:  Appear in no acute distress  Cardiovascular: S1S2 RRR  Respiratory: Clear bilaterally, nontender  Abdomen: No edema  Musculoskeletal: Neck-  No neck tenderness to palpation  Data Reviewed: Basic Metabolic Panel:  Recent Labs Lab 04/08/15 2024 04/08/15 2318 04/08/15 2322 04/09/15 [id number] 04/09/15 0809 04/10/15 0850 04/11/15 1508 04/11/15 1509  NA 135  --  135 134*  --  135  --  137  K 5.2*  --  5.2* 5.2*  --  3.4*  --  3.4*  CL 107  --  108 105  --  104  --  102  CO2 14*  --   --  11*  --  21*  --  20*  GLUCOSE 102*  --  89 89  --  102*  --  134*  BUN 142*  --  136* 146*  --  77*  --  92*  CREATININE 17.14*  --  >18.00* 17.21*  --  11.11*  --  12.33*  CALCIUM 4.9*  --   --  5.1*  --  6.3* 8.2* 6.6*  MG  --  2.1  --   --   --   --   --   --   PHOS  --   --   --   --  8.4* 5.9*  --  5.1*   Liver Function Tests:  Recent Labs Lab 04/08/15 2024 04/10/15 0850 04/11/15 1509  AST 17  --   --   ALT 17  --   --   ALKPHOS 73  --   --   BILITOT 0.6  --   --   PROT 7.1  --   --   ALBUMIN 3.8 2.9* 3.5   No results for input(s): LIPASE, AMYLASE in the last 168 hours. No results for input(s): AMMONIA in the last 168 hours. CBC:  Recent Labs Lab 04/08/15 2024 04/08/15 2322 04/09/15 0624 04/10/15 0850 04/11/15 1508  WBC 6.7  --  6.9 5.8 6.4  NEUTROABS 4.8  --   --   --   --   HGB 5.8* 6.8* 5.4* 9.7* 10.4*  HCT 17.2* 20.0* 16.4* 28.0* 30.4*  MCV 80.4  --  82.8 80.2 80.6  PLT 131*  --  116* 123* 129*   Cardiac Enzymes: No results for input(s): CKTOTAL, CKMB, CKMBINDEX, TROPONINI in the last 168 hours. BNP (last 3 results) No results for input(s): BNP in the last 8760 hours.  ProBNP (last 3 results) No results for input(s): PROBNP in the last 8760 hours.  CBG:  Recent Labs Lab 04/12/15 0621  GLUCAP 76    Recent  Results (from the past 240 hour(s))  Urine culture     Status: None   Collection Time: 04/10/15  1:52 PM  Result Value Ref Range Status   Specimen Description URINE, RANDOM  Final   Special Requests NONE  Final   Culture   Final    MULTIPLE SPECIES PRESENT, SUGGEST RECOLLECTION IF CLINICALLY INDICATED   Report Status 04/12/2015 FINAL  Final     Studies: Ct Head Wo Contrast  04/11/2015   CLINICAL DATA:  [place] approximately 25 days ago, back of the head pain, neck pain  EXAM: CT HEAD WITHOUT CONTRAST  CT CERVICAL SPINE WITHOUT CONTRAST  TECHNIQUE: Multidetector CT imaging of the head and cervical spine was performed following the standard protocol without intravenous contrast. Multiplanar CT image reconstructions of the cervical spine were also generated.  COMPARISON:  None.  FINDINGS: CT HEAD FINDINGS  No skull fracture is noted. Paranasal sinuses and mastoid air cells are unremarkable.  No hydrocephalus. No intracranial hemorrhage, mass effect or midline shift.  No acute cortical infarction. No mass lesion is noted on this unenhanced scan. The gray and white-matter differentiation is preserved.  CT CERVICAL SPINE FINDINGS  Axial images of the cervical spine shows no acute fracture or subluxation. Alignment, disc spaces and vertebral body heights are preserved. There is no pneumothorax in visualized lung apices.  Computer processed images shows no acute fracture or subluxation. No prevertebral soft tissue swelling. Cervical airway is patent.  IMPRESSION: 1. No acute intracranial abnormality. 2. No cervical spine acute fracture or subluxation.   Electronically Signed   By: Lahoma Crocker M.D.   On: 04/11/2015 11:22   Ct Cervical Spine Wo Contrast  04/11/2015   CLINICAL DATA:  [place] approximately 25 days ago, back of the head pain, neck pain  EXAM: CT HEAD WITHOUT CONTRAST  CT CERVICAL SPINE WITHOUT CONTRAST  TECHNIQUE: Multidetector CT imaging of the head and cervical spine was performed following the  standard protocol without intravenous contrast. Multiplanar CT image reconstructions of the cervical spine were also generated.  COMPARISON:  None.  FINDINGS: CT HEAD FINDINGS  No skull fracture is noted. Paranasal sinuses and mastoid air cells are unremarkable.  No hydrocephalus. No intracranial hemorrhage, mass effect or midline shift.  No acute cortical infarction. No mass lesion is noted on this unenhanced scan. The gray and white-matter differentiation is preserved.  CT CERVICAL SPINE FINDINGS  Axial images of the cervical spine shows no acute fracture or subluxation. Alignment, disc spaces and vertebral body heights are preserved. There is no pneumothorax in visualized lung apices.  Computer processed images shows no acute fracture or subluxation. No prevertebral soft tissue swelling. Cervical airway is patent.  IMPRESSION: 1. No acute intracranial abnormality. 2. No cervical spine acute fracture or subluxation.   Electronically Signed   By: Lahoma Crocker M.D.   On: 04/11/2015 11:22    Scheduled Meds: . sodium chloride   Intravenous Once  . amLODipine  10 mg Oral Daily  . calcium carbonate  800 mg of elemental calcium Oral TID WC  . cyclobenzaprine  10 mg Oral TID  . darbepoetin (ARANESP) injection - DIALYSIS  200 mcg Intravenous Q Sat-HD  . doxercalciferol  6 mcg Intravenous Q T,Th,Sa-HD   Continuous Infusions:   Principal Problem:   Acute on chronic renal failure Active Problems:   CKD (chronic kidney disease) stage 5, GFR less than 15 ml/min   Anemia of chronic kidney failure   ESRD (end stage renal disease)    Time spent: 20 min    [facility]  [facility] Pager [phone]*. If 7PM-7AM, please contact night-coverage at [email or web address], password [facility] 04/12/2015, 12:29 PM  LOS: 3 days

## 2015-04-12 NOTE — Progress Notes (Signed)
Subjective:  Had 2nd HD yesterday - well tolerated 3rd tmt planned for today with labs pre Via interpreter - no CP, SOB, nausea. Says in about a month she will be moving to [place] but when discharged will be in [place] for at least a month. She agrees with access placement [facility] and AVF/AVG)  06/20 0701 - 06/21 0700 In: 200 [P.O.:200] Out: 800 [Urine:800]  Filed Weights   04/10/15 2019 04/11/15 1415 04/11/15 1700  Weight: 45.4 kg (100 lb 1.4 oz) 41.3 kg (91 lb 0.8 oz) 41.3 kg (91 lb 0.8 oz)    Physical Exam:  BP 146/93 mmHg  Pulse 85  Temp(Src) 98.1 F (36.7 C) (Oral)  Resp 17  Ht 4\' 8"  (1.422 m)  Wt 41.3 kg (91 lb 0.8 oz)  BMI 20.42 kg/m2  SpO2 100%  GEN: NAD.  ENT: NCAT EYES: Sclerae not icteric CV: RRR, no rub, nl s1s2 PULM: CTAB ABD: soft, non tender SKIN: no rashes, R IJ temp cath with dry dressing in place EXT:No edema, no asterixus   Scheduled Meds: . sodium chloride   Intravenous Once  . amLODipine  10 mg Oral Daily  . calcium carbonate  800 mg of elemental calcium Oral TID WC  . cyclobenzaprine  10 mg Oral TID  . darbepoetin (ARANESP) injection - DIALYSIS  200 mcg Intravenous Q Sat-HD  . doxercalciferol  6 mcg Intravenous Q T,Th,Sa-HD     PRN Meds:.acetaminophen    Recent Labs Lab 04/09/15 0624 04/09/15 0809 04/10/15 0850 04/11/15 1508 04/11/15 1509  NA 134*  --  135  --  137  K 5.2*  --  3.4*  --  3.4*  CL 105  --  104  --  102  CO2 11*  --  21*  --  20*  GLUCOSE 89  --  102*  --  134*  BUN 146*  --  77*  --  92*  CREATININE 17.21*  --  11.11*  --  12.33*  CALCIUM 5.1*  --  6.3* 8.2* 6.6*  PHOS  --  8.4* 5.9*  --  5.1*    Recent Labs Lab 04/08/15 2024  04/09/15 0624 04/10/15 0850 04/11/15 1508  WBC 6.7  --  6.9 5.8 6.4  NEUTROABS 4.8  --   --   --   --   HGB 5.8*  < > 5.4* 9.7* 10.4*  HCT 17.2*  < > 16.4* 28.0* 30.4*  MCV 80.4  --  82.8 80.2 80.6  PLT 131*  --  116* 123* 129*  < > = values in this interval not  displayed.   Results for CLARABEL, RASKIN (MRN [id number]) as of 04/12/2015 12:32  Ref. Range 04/11/2015 15:08  PTH Latest Ref Range: 15-65 pg/mL 275 (H)   Background: 75F followed by Dr. 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 Last seen 09/2014 in our office, then lost to follow-up. Apparently had some residual GFR. She presented to the emergency room with inability to have a bowel movement. She was found to have profound azotemia, anemia, mild hyperkalemia, hypocalcemia. Now ESRD admitted with uremia (BUN >140, creatinine>17), anemia (Hb 5.8), hypocalcemia (4.9). Dialysis initiated 04/09/15  A/P 1. New ESRD 1. Primary ds IgAN 2. Pt has offered no objection to proceeding with HD/permanent access  3. Started 04/09/15, 2nd HD 6/20, 3rd today 4. VVS following and can place [facility] and AV access Thursday of this week (she agreed -  via interpreter) 5. CLIP Pt  - pending - hopeful for [place] since getting access this week (but says will be moving to [place] in a month or so) 6. Next HD today, then TTS.  2. Anemia 1. LIkely 2/2 CKD 2. Transfused 2u PRBC with HD #1 3. Started ESA with HD -- 200 Aranesp qSat 4. TSAT 39%, rec 2u PRBC, no Fe needed 3. CKD-BMD 1. Hypocalcemia 2/2 impaired 1,25 Vit D generation: likely cause of constipation noted on adm 1. Tums TIDAC 2. Started VDRA Hectorol 6qTx.  3. PTH 275 4. High Ca Bath with HD - needs 4K bath today so will have to go with the lower ca 2. HyperPhos: Added Tums qAC also 4. HTN/Vol:  1. not grossly overloaded, attempt some UF next Tx 2. Added amlodipine 6/19 5. ? UTI afebrile, nl WBC; had ceftriaxone - no ATB now  Jamal Maes, MD [facility] [phone] Pager 04/12/2015, 12:28 PM

## 2015-04-12 NOTE — Progress Notes (Signed)
     Vein mapping is complete  Her right cephalic  Is acceptable  The left cephalic is thickened and is thrombosed  Plan AVF/AVG on Thursday if patient is agreeable.  Gage Treiber MAUREEN PA-C

## 2015-04-13 DIAGNOSIS — D631 Anemia in chronic kidney disease: Secondary | ICD-10-CM

## 2015-04-13 DIAGNOSIS — N189 Chronic kidney disease, unspecified: Secondary | ICD-10-CM

## 2015-04-13 DIAGNOSIS — N186 End stage renal disease: Secondary | ICD-10-CM

## 2015-04-13 DIAGNOSIS — N179 Acute kidney failure, unspecified: Secondary | ICD-10-CM

## 2015-04-13 DIAGNOSIS — N185 Chronic kidney disease, stage 5: Secondary | ICD-10-CM

## 2015-04-13 LAB — BASIC METABOLIC PANEL
Anion gap: 11 (ref 5–15)
BUN: 29 mg/dL — AB (ref 6–20)
CO2: 25 mmol/L (ref 22–32)
CREATININE: 5.09 mg/dL — AB (ref 0.44–1.00)
Calcium: 7.5 mg/dL — ABNORMAL LOW (ref 8.9–10.3)
Chloride: 100 mmol/L — ABNORMAL LOW (ref 101–111)
GFR calc Af Amer: 12 mL/min — ABNORMAL LOW (ref >60)
GFR calc non Af Amer: 11 mL/min — ABNORMAL LOW (ref >60)
Glucose, Bld: 96 mg/dL (ref 65–99)
Potassium: 4.2 mmol/L (ref 3.5–5.1)
SODIUM: 136 mmol/L (ref 135–145)

## 2015-04-13 LAB — CBC
HCT: 28.6 % — ABNORMAL LOW (ref 36.0–46.0)
HEMOGLOBIN: 9.4 g/dL — AB (ref 12.0–15.0)
MCH: 27.7 pg (ref 26.0–34.0)
MCHC: 32.9 g/dL (ref 30.0–36.0)
MCV: 84.4 fL (ref 78.0–100.0)
PLATELETS: 117 10*3/uL — AB (ref 150–400)
RBC: 3.39 MIL/uL — ABNORMAL LOW (ref 3.87–5.11)
RDW: 14.5 % (ref 11.5–15.5)
WBC: 6.2 10*3/uL (ref 4.0–10.5)

## 2015-04-13 LAB — PROTIME-INR
INR: 1.03 (ref 0.00–1.49)
Prothrombin Time: 13.7 seconds (ref 11.6–15.2)

## 2015-04-13 MED ORDER — CEFAZOLIN SODIUM 1-5 GM-% IV SOLN
1.0000 g | INTRAVENOUS | Status: AC
  Filled 2015-04-13: qty 50

## 2015-04-13 NOTE — Progress Notes (Signed)
  [facility] Progress Note  Patient for OR tomorrow for right arm fistula versus graft and insertion of tunneled dialysis catheter. Spoke with patient via interpreter and she is agreeable to proceed.   Virgina Jock, PA-C [facility] Office: [phone] Pager: [phone] 04/13/2015 8:31 AM

## 2015-04-13 NOTE — Progress Notes (Signed)
04/13/2015 3:25 PM Hemodialysis Outpatient Note,  The Medicaid application was initiated today for outpatient dialysis. Karla Garner

## 2015-04-13 NOTE — Progress Notes (Signed)
[facility] Rounding Note  Subjective:  Had HD #3 yesterday For permanent access and HD tomorrow (change temp cath to [facility] + AVF/AVG) CLIP pending for [facility] (pending permanent access) and Medicaid application Via interpreter yesterday - [name] in about a month she will be moving to [place] but when discharged will be in [place] for at least a month. 06/21 0701 - 06/22 0700 In: 600 [P.O.:600] Out: 1600 [Urine:1600]  Filed Weights   04/11/15 1700 04/12/15 1306 04/12/15 1606  Weight: 41.3 kg (91 lb 0.8 oz) 41.6 kg (91 lb 11.4 oz) 41.6 kg (91 lb 11.4 oz)    Physical Exam:  BP 141/95 mmHg  Pulse 94  Temp(Src) 98.2 F (36.8 C) (Oral)  Resp 18  Ht 4\' 8"  (1.422 m)  Wt 41.6 kg (91 lb 11.4 oz)  BMI 20.57 kg/m2  SpO2 99%  GEN: NAD.  ENT: NCAT EYES: Sclerae not icteric CV: RRR, no rub, nl s1s2 PULM: CTAB ABD: soft, non tender SKIN: no rashes, R IJ temp cath  EXT:No edema, no asterixus   Scheduled Meds: . sodium chloride   Intravenous Once  . amLODipine  10 mg Oral Daily  . calcium carbonate  800 mg of elemental calcium Oral TID WC  .  ceFAZolin (ANCEF) IV  1 g Intravenous To SS-Surg  . cyclobenzaprine  10 mg Oral TID  . darbepoetin (ARANESP) injection - DIALYSIS  200 mcg Intravenous Q Sat-HD  . doxercalciferol  6 mcg Intravenous Q T,Th,Sa-HD     PRN Meds:.acetaminophen    Recent Labs Lab 04/09/15 0809 04/10/15 0850 04/11/15 1508 04/11/15 1509 04/13/15 0628  NA  --  135  --  137 136  K  --  3.4*  --  3.4* 4.2  CL  --  104  --  102 100*  CO2  --  21*  --  20* 25  GLUCOSE  --  102*  --  134* 96  BUN  --  77*  --  92* 29*  CREATININE  --  11.11*  --  12.33* 5.09*  CALCIUM  --  6.3* 8.2* 6.6* 7.5*  PHOS 8.4* 5.9*  --  5.1*  --     Recent Labs Lab 04/08/15 2024  04/10/15 0850 04/11/15 1508 04/13/15 0628  WBC 6.7  < > 5.8 6.4 6.2  NEUTROABS 4.8  --   --   --   --   HGB 5.8*  < > 9.7* 10.4* 9.4*  HCT 17.2*  < > 28.0* 30.4* 28.6*  MCV 80.4   < > 80.2 80.6 84.4  PLT 131*  < > 123* 129* 117*  < > = values in this interval not displayed.   Results for Karla Garner, Karla Garner (MRN [id number]) as of 04/12/2015 12:32  Ref. Range 04/11/2015 15:08  PTH Latest Ref Range: 15-65 pg/mL 275 (H)   Background: 85F followed by Dr. 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 Last seen 09/2014 in our office, then lost to follow-up. Apparently had some residual GFR. She presented to the emergency room with inability to have a bowel movement. She was found to have profound azotemia, anemia, mild hyperkalemia, hypocalcemia. Now ESRD admitted with uremia (BUN >140, creatinine>17), anemia (Hb 5.8), hypocalcemia (4.9). Dialysis initiated 04/09/15  A/P 1. New ESRD 1. Primary ds IgAN 2. Pt has offered no objection to proceeding with HD/permanent access  3. Started 04/09/15, s/p 3 treatments. Next HD 6/23 4. VVS  following and can place [facility] and AV access Thursday of this week (she agreed - via interpreter) 5. CLIP   - pending - tentative for [facility] (pending access placement and Medicaid application) 6. Continue TTS HD - next TMT tomorrow  2. Anemia 1. 2/2 CKD 2. Transfused 2u PRBC with HD #1 3. Started ESA with HD -- 200 Aranesp qSat 4. TSAT 39%, rec 2u PRBC, no Fe needed 3. CKD-BMD 1. Hypocalcemia 2/2 impaired 1,25 Vit D generation: likely cause of constipation noted on adm 1. Tums TIDAC 2. Started VDRA Hectorol 6qTx.  3. PTH 275 4. Calcium improving 2. HyperPhos: Added Tums qAC also 4. HTN/Vol:  1. Not much evidence at this time for volume excess. 41 will be EDW for now. 2. Added amlodipine 6/19 5. ? UTI afebrile, nl WBC; had ceftriaxone - no ATB now  Jamal Maes, MD [place] Pager 04/13/2015, 1:38 PM

## 2015-04-13 NOTE — Progress Notes (Signed)
[facility] PROGRESS NOTE  Karla Garner [id number] DOB: [birth year] DOA: 04/08/2015 PCP: Pcp Not In System  Assessment/Plan: 1. New ESRD- patient started on hemodialysis, vascular surgery following for AV access next week. 2. ?UTI- Patient had abnormal UA and was started on ceftriaxone for ? Uti, no urine culture obtained in ED. She has been afebrile with normal wbc, ceftriaxone has been discontinued. 3. Neck pain-  Improved, s/p fall prior to coming to the hospital, CT head and c spine negative, Started Flexeril 10 mg po TID for muscle spasm. 4. Anemia- secondary to CKD, s/p two units PRBC. Hb is 10.4 today.  Code Status: Full code Family Communication: No family at bedside Disposition Plan: Home when medically stable   Consultants:  Nephrology  Procedures:  HD  Antibiotics:  None  HPI/Subjective: 29 y.o. female with history of glomerulonephritis. She had been on dialysis previously since at least 2014. She had a ligation of an AV fistula due to pain in January of this year. It seems that the patient apparently had been off of dialysis for the past year and a half or so with baseline creatinine of 6.9. It seems like she has not been following up with nephrology. Patient started on hemodialysis.patient complained of neck pain, she fell few days ago before coming to hospital while coming downstairs, and hit her head and neck on the floor. She complains of headache and left sided neck pain, on movement.Also complains of numbness of the left arm. CT head and c spine were obtained, which were negative for acute abnormality.  Today she denies neck pain, feels better.   Objective: Filed Vitals:   04/13/15 1814  BP: 130/89  Pulse: 110  Temp: 98.7 F (37.1 C)  Resp: 17    Intake/Output Summary (Last 24 hours) at 04/13/15 2017 Last data filed at 04/13/15 1815  Gross per 24 hour  Intake    840 ml  Output   1150 ml  Net   -310 ml   Filed Weights   04/11/15 1700 04/12/15 1306 04/12/15 1606  Weight: 41.3 kg (91 lb 0.8 oz) 41.6 kg (91 lb 11.4 oz) 41.6 kg (91 lb 11.4 oz)    Exam:   General:  Appear in no acute distress  Cardiovascular: S1S2 RRR  Respiratory: Clear bilaterally, nontender  Abdomen: No edema  Musculoskeletal: Neck-  No neck tenderness to palpation  Data Reviewed: Basic Metabolic Panel:  Recent Labs Lab 04/08/15 2024 04/08/15 2318 04/08/15 2322 04/09/15 [id number] 04/09/15 0809 04/10/15 0850 04/11/15 1508 04/11/15 1509 04/13/15 0628  NA 135  --  135 134*  --  135  --  137 136  K 5.2*  --  5.2* 5.2*  --  3.4*  --  3.4* 4.2  CL 107  --  108 105  --  104  --  102 100*  CO2 14*  --   --  11*  --  21*  --  20* 25  GLUCOSE 102*  --  89 89  --  102*  --  134* 96  BUN 142*  --  136* 146*  --  77*  --  92* 29*  CREATININE 17.14*  --  >18.00* 17.21*  --  11.11*  --  12.33* 5.09*  CALCIUM 4.9*  --   --  5.1*  --  6.3* 8.2* 6.6* 7.5*  MG  --  2.1  --   --   --   --   --   --   --  PHOS  --   --   --   --  8.4* 5.9*  --  5.1*  --    Liver Function Tests:  Recent Labs Lab 04/08/15 2024 04/10/15 0850 04/11/15 1509  AST 17  --   --   ALT 17  --   --   ALKPHOS 73  --   --   BILITOT 0.6  --   --   PROT 7.1  --   --   ALBUMIN 3.8 2.9* 3.5   No results for input(s): LIPASE, AMYLASE in the last 168 hours. No results for input(s): AMMONIA in the last 168 hours. CBC:  Recent Labs Lab 04/08/15 2024 04/08/15 2322 04/09/15 0624 04/10/15 0850 04/11/15 1508 04/13/15 0628  WBC 6.7  --  6.9 5.8 6.4 6.2  NEUTROABS 4.8  --   --   --   --   --   HGB 5.8* 6.8* 5.4* 9.7* 10.4* 9.4*  HCT 17.2* 20.0* 16.4* 28.0* 30.4* 28.6*  MCV 80.4  --  82.8 80.2 80.6 84.4  PLT 131*  --  116* 123* 129* 117*   Cardiac Enzymes: No results for input(s): CKTOTAL, CKMB, CKMBINDEX, TROPONINI in the last 168 hours. BNP (last 3 results) No results for input(s): BNP in the last 8760 hours.  ProBNP (last 3 results) No results for  input(s): PROBNP in the last 8760 hours.  CBG:  Recent Labs Lab 04/12/15 0621  GLUCAP 76    Recent Results (from the past 240 hour(s))  Urine culture     Status: None   Collection Time: 04/10/15  1:52 PM  Result Value Ref Range Status   Specimen Description URINE, RANDOM  Final   Special Requests NONE  Final   Culture   Final    MULTIPLE SPECIES PRESENT, SUGGEST RECOLLECTION IF CLINICALLY INDICATED   Report Status 04/12/2015 FINAL  Final     Studies: No results found.  Scheduled Meds: . sodium chloride   Intravenous Once  . amLODipine  10 mg Oral Daily  . calcium carbonate  800 mg of elemental calcium Oral TID WC  .  ceFAZolin (ANCEF) IV  1 g Intravenous To SS-Surg  . cyclobenzaprine  10 mg Oral TID  . darbepoetin (ARANESP) injection - DIALYSIS  200 mcg Intravenous Q Sat-HD  . doxercalciferol  6 mcg Intravenous Q T,Th,Sa-HD   Continuous Infusions:   Principal Problem:   Acute on chronic renal failure Active Problems:   CKD (chronic kidney disease) stage 5, GFR less than 15 ml/min   Anemia of chronic kidney failure   ESRD (end stage renal disease)    Time spent: 20 min    Pennie Vanblarcom MD PhD  [facility] Pager [phone] If 7PM-7AM, please contact night-coverage at [email or web address], password [facility] 04/13/2015, 8:17 PM  LOS: 4 days

## 2015-04-14 ENCOUNTER — Encounter (HOSPITAL_COMMUNITY)
Admission: EM | Disposition: A | Discharge status: Home / Self Care | Payer: Self-pay | Source: Home / Self Care | Attending: Family Medicine

## 2015-04-14 ENCOUNTER — Inpatient Hospital Stay (HOSPITAL_COMMUNITY): Payer: Self-pay | Admitting: Anesthesiology

## 2015-04-14 ENCOUNTER — Encounter (HOSPITAL_COMMUNITY): Payer: Self-pay | Admitting: Anesthesiology

## 2015-04-14 ENCOUNTER — Other Ambulatory Visit: Payer: Self-pay | Admitting: *Deleted

## 2015-04-14 ENCOUNTER — Inpatient Hospital Stay (HOSPITAL_COMMUNITY): Payer: Self-pay

## 2015-04-14 DIAGNOSIS — Z4931 Encounter for adequacy testing for hemodialysis: Secondary | ICD-10-CM

## 2015-04-14 DIAGNOSIS — N186 End stage renal disease: Secondary | ICD-10-CM

## 2015-04-14 HISTORY — PX: EXCHANGE OF A DIALYSIS CATHETER: SHX5818

## 2015-04-14 HISTORY — PX: AV FISTULA PLACEMENT: SHX1204

## 2015-04-14 LAB — RENAL FUNCTION PANEL
Albumin: 3.1 g/dL — ABNORMAL LOW (ref 3.5–5.0)
Anion gap: 11 (ref 5–15)
BUN: 57 mg/dL — ABNORMAL HIGH (ref 6–20)
CALCIUM: 7.4 mg/dL — AB (ref 8.9–10.3)
CO2: 23 mmol/L (ref 22–32)
Chloride: 103 mmol/L (ref 101–111)
Creatinine, Ser: 8.11 mg/dL — ABNORMAL HIGH (ref 0.44–1.00)
GFR calc non Af Amer: 6 mL/min — ABNORMAL LOW (ref >60)
GFR, EST AFRICAN AMERICAN: 7 mL/min — AB (ref >60)
GLUCOSE: 108 mg/dL — AB (ref 65–99)
Phosphorus: 4.8 mg/dL — ABNORMAL HIGH (ref 2.5–4.6)
Potassium: 4.6 mmol/L (ref 3.5–5.1)
Sodium: 137 mmol/L (ref 135–145)

## 2015-04-14 LAB — CBC
HEMATOCRIT: 26.8 % — AB (ref 36.0–46.0)
Hemoglobin: 8.7 g/dL — ABNORMAL LOW (ref 12.0–15.0)
MCH: 28.2 pg (ref 26.0–34.0)
MCHC: 32.5 g/dL (ref 30.0–36.0)
MCV: 87 fL (ref 78.0–100.0)
PLATELETS: 134 10*3/uL — AB (ref 150–400)
RBC: 3.08 MIL/uL — ABNORMAL LOW (ref 3.87–5.11)
RDW: 14.8 % (ref 11.5–15.5)
WBC: 9.5 10*3/uL (ref 4.0–10.5)

## 2015-04-14 SURGERY — ARTERIOVENOUS (AV) FISTULA CREATION
Anesthesia: Monitor Anesthesia Care | Site: Neck | Laterality: Right

## 2015-04-14 MED ORDER — HYDROMORPHONE HCL 1 MG/ML IJ SOLN
INTRAMUSCULAR | Status: AC
  Administered 2015-04-14: 0.5 mg via INTRAVENOUS
  Filled 2015-04-14: qty 1

## 2015-04-14 MED ORDER — HYDROMORPHONE HCL 1 MG/ML IJ SOLN
0.5000 mg | INTRAMUSCULAR | Status: AC | PRN
  Administered 2015-04-14 – 2015-04-15 (×3): 0.5 mg via INTRAVENOUS
  Filled 2015-04-14 (×3): qty 1

## 2015-04-14 MED ORDER — LIDOCAINE HCL (CARDIAC) 20 MG/ML IV SOLN
INTRAVENOUS | Status: DC | PRN
  Administered 2015-04-14: 40 mg via INTRAVENOUS

## 2015-04-14 MED ORDER — PROMETHAZINE HCL 25 MG/ML IJ SOLN: 6.2500 mg | INTRAMUSCULAR | Status: DC | PRN

## 2015-04-14 MED ORDER — PAPAVERINE HCL 30 MG/ML IJ SOLN
INTRAMUSCULAR | Status: DC | PRN
  Administered 2015-04-14: 60 mg via INTRAVENOUS

## 2015-04-14 MED ORDER — LIDOCAINE HCL (PF) 1 % IJ SOLN
INTRAMUSCULAR | Status: AC
  Filled 2015-04-14: qty 30

## 2015-04-14 MED ORDER — PROPOFOL 10 MG/ML IV BOLUS
INTRAVENOUS | Status: AC
  Filled 2015-04-14: qty 20

## 2015-04-14 MED ORDER — PAPAVERINE HCL 30 MG/ML IJ SOLN
INTRAMUSCULAR | Status: AC
  Filled 2015-04-14: qty 2

## 2015-04-14 MED ORDER — MIDAZOLAM HCL 2 MG/2ML IJ SOLN
INTRAMUSCULAR | Status: AC
  Filled 2015-04-14: qty 2

## 2015-04-14 MED ORDER — PROTAMINE SULFATE 10 MG/ML IV SOLN
INTRAVENOUS | Status: DC | PRN
  Administered 2015-04-14: 20 mg via INTRAVENOUS

## 2015-04-14 MED ORDER — FENTANYL CITRATE (PF) 100 MCG/2ML IJ SOLN
INTRAMUSCULAR | Status: DC | PRN
  Administered 2015-04-14 (×5): 50 ug via INTRAVENOUS

## 2015-04-14 MED ORDER — MIDAZOLAM HCL 5 MG/5ML IJ SOLN
INTRAMUSCULAR | Status: DC | PRN
  Administered 2015-04-14: 2 mg via INTRAVENOUS

## 2015-04-14 MED ORDER — HEPARIN SODIUM (PORCINE) 1000 UNIT/ML IJ SOLN
INTRAMUSCULAR | Status: DC | PRN
  Administered 2015-04-14: 4000 [IU] via INTRAVENOUS

## 2015-04-14 MED ORDER — DOXERCALCIFEROL 4 MCG/2ML IV SOLN
INTRAVENOUS | Status: AC
  Administered 2015-04-14: 6 ug via INTRAVENOUS
  Filled 2015-04-14: qty 4

## 2015-04-14 MED ORDER — HEPARIN SODIUM (PORCINE) 1000 UNIT/ML IJ SOLN
INTRAMUSCULAR | Status: DC | PRN
  Administered 2015-04-14: 4.2 mL

## 2015-04-14 MED ORDER — CEFAZOLIN SODIUM-DEXTROSE 2-3 GM-% IV SOLR
INTRAVENOUS | Status: AC
  Filled 2015-04-14: qty 50

## 2015-04-14 MED ORDER — PROPOFOL 10 MG/ML IV BOLUS
INTRAVENOUS | Status: DC | PRN
  Administered 2015-04-14: 140 mg via INTRAVENOUS

## 2015-04-14 MED ORDER — OXYCODONE HCL 5 MG PO TABS
ORAL_TABLET | ORAL | Status: AC
  Filled 2015-04-14: qty 1

## 2015-04-14 MED ORDER — HYDROMORPHONE HCL 1 MG/ML IJ SOLN
0.2500 mg | INTRAMUSCULAR | Status: DC | PRN
  Administered 2015-04-14 (×2): 0.25 mg via INTRAVENOUS
  Administered 2015-04-14: 0.5 mg via INTRAVENOUS

## 2015-04-14 MED ORDER — 0.9 % SODIUM CHLORIDE (POUR BTL) OPTIME
TOPICAL | Status: DC | PRN
  Administered 2015-04-14: 1000 mL

## 2015-04-14 MED ORDER — SODIUM CHLORIDE 0.9 % IV SOLN
INTRAVENOUS | Status: DC
  Administered 2015-04-14: 10:00:00 via INTRAVENOUS

## 2015-04-14 MED ORDER — LIDOCAINE HCL (PF) 1 % IJ SOLN
INTRAMUSCULAR | Status: DC | PRN
  Administered 2015-04-14: 3 mL via INTRADERMAL

## 2015-04-14 MED ORDER — OXYCODONE HCL 5 MG PO TABS
5.0000 mg | ORAL_TABLET | Freq: Four times a day (QID) | ORAL | Status: DC | PRN
  Administered 2015-04-14 – 2015-04-16 (×4): 5 mg via ORAL
  Filled 2015-04-14 (×4): qty 1

## 2015-04-14 MED ORDER — HEPARIN SODIUM (PORCINE) 1000 UNIT/ML IJ SOLN
INTRAMUSCULAR | Status: AC
  Filled 2015-04-14: qty 1

## 2015-04-14 MED ORDER — SODIUM CHLORIDE 0.9 % IR SOLN
Status: DC | PRN
  Administered 2015-04-14: 500 mL

## 2015-04-14 MED ORDER — PROTAMINE SULFATE 10 MG/ML IV SOLN
INTRAVENOUS | Status: AC
  Filled 2015-04-14: qty 5

## 2015-04-14 MED ORDER — THROMBIN 20000 UNITS EX SOLR
CUTANEOUS | Status: AC
  Filled 2015-04-14: qty 20000

## 2015-04-14 MED ORDER — LIDOCAINE HCL (CARDIAC) 20 MG/ML IV SOLN
INTRAVENOUS | Status: AC
  Filled 2015-04-14: qty 10

## 2015-04-14 MED ORDER — RENA-VITE PO TABS
1.0000 | ORAL_TABLET | Freq: Every day | ORAL | Status: DC
  Administered 2015-04-14 – 2015-04-15 (×2): 1 via ORAL
  Filled 2015-04-14 (×3): qty 1

## 2015-04-14 MED ORDER — ONDANSETRON HCL 4 MG/2ML IJ SOLN
INTRAMUSCULAR | Status: DC | PRN
  Administered 2015-04-14: 4 mg via INTRAVENOUS

## 2015-04-14 MED ORDER — CEFAZOLIN SODIUM-DEXTROSE 2-3 GM-% IV SOLR
INTRAVENOUS | Status: DC | PRN
  Administered 2015-04-14: 2 g via INTRAVENOUS

## 2015-04-14 MED ORDER — FENTANYL CITRATE (PF) 250 MCG/5ML IJ SOLN
INTRAMUSCULAR | Status: AC
  Filled 2015-04-14: qty 5

## 2015-04-14 SURGICAL SUPPLY — 59 items
ARMBAND PINK RESTRICT EXTREMIT (MISCELLANEOUS) ×5 IMPLANT
BAG DECANTER FOR FLEXI CONT (MISCELLANEOUS) ×5 IMPLANT
BIOPATCH RED 1 DISK 7.0 (GAUZE/BANDAGES/DRESSINGS) ×4 IMPLANT
BIOPATCH RED 1IN DISK 7.0MM (GAUZE/BANDAGES/DRESSINGS) ×1
CANISTER SUCTION 2500CC (MISCELLANEOUS) ×5 IMPLANT
CANNULA VESSEL 3MM 2 BLNT TIP (CANNULA) ×10 IMPLANT
CATH CANNON HEMO 15F 50CM (CATHETERS) IMPLANT
CATH CANNON HEMO 15FR 19 (HEMODIALYSIS SUPPLIES) ×5 IMPLANT
CATH CANNON HEMO 15FR 23CM (HEMODIALYSIS SUPPLIES) IMPLANT
CATH CANNON HEMO 15FR 31CM (HEMODIALYSIS SUPPLIES) IMPLANT
CATH CANNON HEMO 15FR 32CM (HEMODIALYSIS SUPPLIES) IMPLANT
CHLORAPREP W/TINT 26ML (MISCELLANEOUS) ×5 IMPLANT
CLIP TI MEDIUM 6 (CLIP) ×5 IMPLANT
CLIP TI WIDE RED SMALL 6 (CLIP) ×5 IMPLANT
COVER PROBE W GEL 5X96 (DRAPES) IMPLANT
DECANTER SPIKE VIAL GLASS SM (MISCELLANEOUS) IMPLANT
DRAPE C-ARM 42X72 X-RAY (DRAPES) IMPLANT
DRAPE CHEST BREAST 15X10 FENES (DRAPES) ×5 IMPLANT
ELECT REM PT RETURN 9FT ADLT (ELECTROSURGICAL) ×5
ELECTRODE REM PT RTRN 9FT ADLT (ELECTROSURGICAL) ×3 IMPLANT
GAUZE SPONGE 2X2 8PLY STRL LF (GAUZE/BANDAGES/DRESSINGS) ×3 IMPLANT
GAUZE SPONGE 4X4 16PLY XRAY LF (GAUZE/BANDAGES/DRESSINGS) ×5 IMPLANT
GLOVE BIO SURGEON STRL SZ 6.5 (GLOVE) ×16 IMPLANT
GLOVE BIO SURGEON STRL SZ7.5 (GLOVE) ×10 IMPLANT
GLOVE BIO SURGEONS STRL SZ 6.5 (GLOVE) ×4
GLOVE BIOGEL PI IND STRL 6.5 (GLOVE) ×12 IMPLANT
GLOVE BIOGEL PI IND STRL 8 (GLOVE) ×6 IMPLANT
GLOVE BIOGEL PI INDICATOR 6.5 (GLOVE) ×8
GLOVE BIOGEL PI INDICATOR 8 (GLOVE) ×4
GLOVE ECLIPSE 6.5 STRL STRAW (GLOVE) ×5 IMPLANT
GLOVE SURG SS PI 7.0 STRL IVOR (GLOVE) ×5 IMPLANT
GOWN STRL REUS W/ TWL LRG LVL3 (GOWN DISPOSABLE) ×15 IMPLANT
GOWN STRL REUS W/TWL LRG LVL3 (GOWN DISPOSABLE) ×10
KIT BASIN OR (CUSTOM PROCEDURE TRAY) ×5 IMPLANT
KIT ROOM TURNOVER OR (KITS) ×5 IMPLANT
LIQUID BAND (GAUZE/BANDAGES/DRESSINGS) ×10 IMPLANT
NEEDLE 18GX1X1/2 (RX/OR ONLY) (NEEDLE) ×10 IMPLANT
NEEDLE 22X1 1/2 (OR ONLY) (NEEDLE) IMPLANT
NEEDLE HYPO 25GX1X1/2 BEV (NEEDLE) ×5 IMPLANT
NS IRRIG 1000ML POUR BTL (IV SOLUTION) ×5 IMPLANT
PACK CV ACCESS (CUSTOM PROCEDURE TRAY) ×5 IMPLANT
PACK SURGICAL SETUP 50X90 (CUSTOM PROCEDURE TRAY) IMPLANT
PAD ARMBOARD 7.5X6 YLW CONV (MISCELLANEOUS) ×10 IMPLANT
SPONGE GAUZE 2X2 STER 10/PKG (GAUZE/BANDAGES/DRESSINGS) ×2
SPONGE SURGIFOAM ABS GEL 100 (HEMOSTASIS) IMPLANT
SUT ETHILON 3 0 PS 1 (SUTURE) ×5 IMPLANT
SUT PROLENE 6 0 BV (SUTURE) ×5 IMPLANT
SUT VIC AB 3-0 SH 27 (SUTURE) ×2
SUT VIC AB 3-0 SH 27X BRD (SUTURE) ×3 IMPLANT
SUT VICRYL 4-0 PS2 18IN ABS (SUTURE) ×10 IMPLANT
SYR 20CC LL (SYRINGE) ×5 IMPLANT
SYR 5ML LL (SYRINGE) ×10 IMPLANT
SYR CONTROL 10ML LL (SYRINGE) IMPLANT
SYRINGE 10CC LL (SYRINGE) ×10 IMPLANT
TAPE CLOTH SURG 4X10 WHT LF (GAUZE/BANDAGES/DRESSINGS) ×5 IMPLANT
TOWEL OR 17X24 6PK STRL BLUE (TOWEL DISPOSABLE) ×5 IMPLANT
TOWEL OR 17X26 10 PK STRL BLUE (TOWEL DISPOSABLE) ×5 IMPLANT
UNDERPAD 30X30 INCONTINENT (UNDERPADS AND DIAPERS) ×5 IMPLANT
WATER STERILE IRR 1000ML POUR (IV SOLUTION) ×5 IMPLANT

## 2015-04-14 NOTE — Anesthesia Preprocedure Evaluation (Signed)
Anesthesia Evaluation  Patient identified by MRN, date of birth, ID band Patient awake    History of Anesthesia Complications (+) PONV  Airway Mallampati: II   Neck ROM: Full    Dental  (+) Teeth Intact, Caps   Pulmonary neg pulmonary ROS,  breath sounds clear to auscultation        Cardiovascular hypertension, Rhythm:Regular Rate:Normal     Neuro/Psych negative neurological ROS     GI/Hepatic   Endo/Other    Renal/GU ESRF and DialysisRenal disease     Musculoskeletal   Abdominal   Peds  Hematology  (+) anemia ,   Anesthesia Other Findings   Reproductive/Obstetrics                             Anesthesia Physical Anesthesia Plan  ASA: III  Anesthesia Plan: MAC and General   Post-op Pain Management:    Induction: Intravenous  Airway Management Planned: LMA  Additional Equipment:   Intra-op Plan:   Post-operative Plan: Extubation in OR  Informed Consent: I have reviewed the patients History and Physical, chart, labs and discussed the procedure including the risks, benefits and alternatives for the proposed anesthesia with the patient or authorized representative who has indicated his/her understanding and acceptance.   Dental advisory given  Plan Discussed with: CRNA and Surgeon  Anesthesia Plan Comments:         Anesthesia Quick Evaluation

## 2015-04-14 NOTE — H&P (View-Only) (Signed)
  [facility] Progress Note  Patient for OR tomorrow for right arm fistula versus graft and insertion of tunneled dialysis catheter. Spoke with patient via interpreter and she is agreeable to proceed.   Virgina Jock, PA-C [facility] Office: [phone] Pager: [phone] 04/13/2015 8:31 AM

## 2015-04-14 NOTE — Interval H&P Note (Signed)
History and Physical Interval Note:  04/14/2015 10:02 AM  [name] Records  has presented today for surgery, with the diagnosis of End Stage Renal Disease N18.6  The various methods of treatment have been discussed with the patient and family. After consideration of risks, benefits and other options for treatment, the patient has consented to  Procedure(s): ARTERIOVENOUS (AV) FISTULA CREATION VERSUS GRAFT INSERTION (Right) INSERTION OF DIALYSIS CATHETER (N/A) as a surgical intervention .  The patient's history has been reviewed, patient examined, no change in status, stable for surgery.  I have reviewed the patient's chart and labs.  Questions were answered to the patient's satisfaction.     Karla Garner

## 2015-04-14 NOTE — Progress Notes (Signed)
S: Some pain post procedure O:BP 147/97 mmHg  Pulse 116  Temp(Src) 98.9 F (37.2 C) (Oral)  Resp 18  Ht 4\' 8"  (1.422 m)  Wt 42 kg (92 lb 9.5 oz)  BMI 20.77 kg/m2  SpO2 96%  Intake/Output Summary (Last 24 hours) at 04/14/15 1644 Last data filed at 04/14/15 1215  Gross per 24 hour  Intake    780 ml  Output     25 ml  Net    755 ml   Weight change: 0.4 kg (14.1 oz) [id number] and alert CVS:RRR Resp:clear Abd:+ BS NTND Ext:no edema  Rt AVF + bruit NEURO:CNI No asterixis Rt IJ permcath   . sodium chloride   Intravenous Once  . amLODipine  10 mg Oral Daily  . calcium carbonate  800 mg of elemental calcium Oral TID WC  . cyclobenzaprine  10 mg Oral TID  . darbepoetin (ARANESP) injection - DIALYSIS  200 mcg Intravenous Q Sat-HD  . doxercalciferol  6 mcg Intravenous Q T,Th,Sa-HD  . multivitamin  1 tablet Oral QHS  . oxyCODONE       Dg Chest Port 1 View  04/14/2015   CLINICAL DATA:  Central catheter placement  EXAM: PORTABLE CHEST - 1 VIEW  COMPARISON:  None.  FINDINGS: Dual-lumen catheter placed with the distal tip at the cavoatrial junction. No pneumothorax. There is atelectasis in the left base. The lungs elsewhere clear. Heart is upper normal in size with pulmonary vascularity within normal limits. No adenopathy.  IMPRESSION: Catheter tip at cavoatrial junction. No pneumothorax. Left lower lobe atelectasis. Lungs otherwise clear.   Electronically Signed   By: Lowella Grip III M.D.   On: 04/14/2015 14:18   BMET    Component Value Date/Time   NA 136 04/13/2015 0628   K 4.2 04/13/2015 0628   CL 100* 04/13/2015 0628   CO2 25 04/13/2015 0628   GLUCOSE 96 04/13/2015 0628   BUN 29* 04/13/2015 0628   CREATININE 5.09* 04/13/2015 0628   CREATININE 3.91* 02/23/2013 1600   CREATININE 3.74* 02/16/2013 1221   CALCIUM 7.5* 04/13/2015 0628   CALCIUM 8.2* 04/11/2015 1508   GFRNONAA 11* 04/13/2015 0628   GFRAA 12* 04/13/2015 0628   CBC    Component Value Date/Time   WBC 6.2  04/13/2015 0628   RBC 3.39* 04/13/2015 0628   HGB 9.4* 04/13/2015 0628   HCT 28.6* 04/13/2015 0628   PLT 117* 04/13/2015 0628   MCV 84.4 04/13/2015 0628   MCH 27.7 04/13/2015 0628   MCHC 32.9 04/13/2015 0628   RDW 14.5 04/13/2015 0628   LYMPHSABS 1.4 04/08/2015 2024   MONOABS 0.4 04/08/2015 2024   EOSABS 0.1 04/08/2015 2024   BASOSABS 0.0 04/08/2015 2024     Assessment: 1. ESRD 2. Anemia on aranesp 3. Sec HPTH on hectorol 4. HTN  Plan: 1. HD tonight. 2.  Should be able to be Dc'd soon.  Medicaid application has not been a reason for a pt not to be accepted at outpt unit as of late.  [facility] seems to have stopped this practice.   Karla Garner T

## 2015-04-14 NOTE — Progress Notes (Signed)
Pt alert, vss, HD completed with no complications. Report given to primary RN. Pt returned safely to room.

## 2015-04-14 NOTE — Op Note (Signed)
    NAME: Karla Garner  MRN: [id number] DOB: [birth year]    DATE OF OPERATION: 04/14/2015  PREOP DIAGNOSIS: Stage V chronic kidney disease  POSTOP DIAGNOSIS: Same  PROCEDURE:  1. Placement of right IJ 19 cm tunneled dialysis catheter 2. Right radial cephalic AV fistula  SURGEON: Judeth Cornfield. Scot Dock, MD, FACS  ASSIST: Leontine Locket, PA  ANESTHESIA: Gen.   EBL: minimal  INDICATIONS: [name] is a 29 y.o. female who is now agreeable to start dialysis. I was asked to change her temporary catheter to a PermCath and also place long-term access.  She had previously had a fistula placed in [facility] which was not functioning we therefore selected the right arm. The fistula on the left had to be ligated because of severe pain in the left arm.  FINDINGS: 2 mm radial artery. 3 mm cephalic vein  TECHNIQUE: The patient was taken to the operative room and received general anesthetic. The neck and upper chest were prepped and draped in usual sterile fashion. The previous catheter was clamped and then divided and the distal aspect removed. A wire was passed through the old catheter and advanced into the right atrium under fluoroscopic control. The old catheter was removed. The tract over the wire was dilated and then the dilator and peel-away sheath were advanced over the wire and the wire and dilator removed. A 19 cm catheter was advanced into the right atrium and the peel-away sheath was removed. The exit site of the catheter was selected and the catheter was brought to the tunnel cut to the appropriate length and the distal ports were attached. Both ports withdrew easily within flushed with heparin saline instilled concentrated heparin. The catheter was secured at its exit site with a 3-0 nylon suture. The IJ cannulation site was closed with 40 subcutaneous stitch. Sterile dressing was applied.  Attention was turned to the right arm. The right arm was prepped and draped in  usual sterile fashion. An oblique incision was made encompassing the radial artery and the distal cephalic vein at the wrist. The radial artery was approximately 2 mm in diameter. The cephalic vein was approximately 3-3.5 mm in diameter. I used papaverine to dilate the vessels. The vein was mobilized and then ligated distally and irrigated up with heparinized saline. The patient was heparinized. Radial artery was clamped proximally and distally and longitudinal arteriotomy was made. The vein was spatulated and sewn end-to-side to the radial artery using continuous 60 proline suture. At the completion was a good thrill in the fistula. The heparin was partially reversed with protamine. The wound was closed with 3-0 Vicryls, and the skin closed with 4-0 Vycryl. A sterile dressing was applied. The patient tolerated the procedure well and was transferred to the recovery room in stable condition. All needle and sponge counts were correct.  Deitra Mayo, MD, FACS [facility]  DATE OF DICTATION:   04/14/2015

## 2015-04-14 NOTE — Transfer of Care (Signed)
Immediate Anesthesia Transfer of Care Note  Patient: Karla Garner  Procedure(s) Performed: Procedure(s): CREATION OF RIGHT RADIOCEPHALIC ARTERIOVENOUS (AV) FISTULA  (Right) EXCHANGE OF RIGHT INTERNAL JUGULAR DIALYSIS CATHETER (Right)  Patient Location: PACU  Anesthesia Type:General  Level of Consciousness: oriented, sedated, patient cooperative and responds to stimulation  Airway & Oxygen Therapy: Patient Spontanous Breathing and Patient connected to nasal cannula oxygen  Post-op Assessment: Report given to RN, Post -op Vital signs reviewed and stable, Patient moving all extremities and Patient moving all extremities X 4  Post vital signs: Reviewed and stable  Last Vitals:  Filed Vitals:   04/14/15 0700  BP: 132/84  Pulse: 94  Temp: 36.6 C  Resp: 18    Complications: No apparent anesthesia complications

## 2015-04-14 NOTE — Anesthesia Postprocedure Evaluation (Signed)
  Anesthesia Post-op Note  Patient: Karla Garner  Procedure(s) Performed: Procedure(s): CREATION OF RIGHT RADIOCEPHALIC ARTERIOVENOUS (AV) FISTULA  (Right) EXCHANGE OF RIGHT INTERNAL JUGULAR DIALYSIS CATHETER (Right)  Patient Location: PACU  Anesthesia Type:General  Level of Consciousness: awake and alert   Airway and Oxygen Therapy: Patient Spontanous Breathing  Post-op Pain: mild  Post-op Assessment: Post-op Vital signs reviewed              Post-op Vital Signs: stable  Last Vitals:  Filed Vitals:   04/14/15 1320  BP: 126/81  Pulse: 93  Temp:   Resp: 12    Complications: No apparent anesthesia complications

## 2015-04-14 NOTE — Progress Notes (Signed)
[facility] PROGRESS NOTE  Karla Garner [id number] DOB: October 10, 1986 DOA: 04/08/2015 PCP: Pcp Not In System  Assessment/Plan: 1. New ESRD- patient started on hemodialysis, s/p right IJ tunneled dialysis catheter placement and creation of right arm AVF on 6/23 2. ?UTI- Patient had abnormal UA and was started on ceftriaxone for ? Uti, no urine culture obtained in ED. She has been afebrile with normal wbc, ceftriaxone has been discontinued. 3. Neck pain-  Improved, s/p fall prior to coming to the hospital, CT head and c spine negative, Started Flexeril 10 mg po TID for muscle spasm. 4. Anemia- secondary to CKD, s/p two units PRBC. Monitor hgb  Code Status: Full code Family Communication: No family at bedside Disposition Plan: Home when medically stable   Consultants:  Nephrology  Vascular surgery  Procedures:  HD  Placement of right IJ 19 cm tunneled dialysis catheter Right radial cephalic AV fistula  Antibiotics:  None  HPI/Subjective: 29 y.o. female with history of glomerulonephritis. She had been on dialysis previously since at least 2014. She had a ligation of an AV fistula due to pain in January of this year. It seems that the patient apparently had been off of dialysis for the past year and a half or so with baseline creatinine of 6.9. It seems like she has not been following up with nephrology. Patient started on hemodialysis.patient complained of neck pain, she fell few days ago before coming to hospital while coming downstairs, and hit her head and neck on the floor. She complains of headache and left sided neck pain, on movement.Also complains of numbness of the left arm. CT head and c spine were obtained, which were negative for acute abnormality.  Returned from surgery, awaiting to go to HD unit, c/o arm pain, otherwise stable  Objective: Filed Vitals:   04/14/15 1900  BP: 151/97  Pulse: 117  Temp:   Resp:     Intake/Output Summary (Last 24  hours) at 04/14/15 1922 Last data filed at 04/14/15 1215  Gross per 24 hour  Intake    540 ml  Output     25 ml  Net    515 ml   Filed Weights   04/12/15 1606 04/13/15 2053 04/14/15 1810  Weight: 41.6 kg (91 lb 11.4 oz) 42 kg (92 lb 9.5 oz) 44.5 kg (98 lb 1.7 oz)    Exam:   General:  Appear in no acute distress, s/p tunneled right IJ placement  Cardiovascular: S1S2 RRR  Respiratory: Clear bilaterally, nontender  Abdomen: No edema  Musculoskeletal: Neck-  No neck tenderness to palpation, s/p creation of right AVF.     Data Reviewed: Basic Metabolic Panel:  Recent Labs Lab 04/08/15 2318  04/09/15 [id number] 04/09/15 0809 04/10/15 0850 04/11/15 1508 04/11/15 1509 04/13/15 0628 04/14/15 1800  NA  --   < > 134*  --  135  --  137 136 137  K  --   < > 5.2*  --  3.4*  --  3.4* 4.2 4.6  CL  --   < > 105  --  104  --  102 100* 103  CO2  --   --  11*  --  21*  --  20* 25 23  GLUCOSE  --   < > 89  --  102*  --  134* 96 108*  BUN  --   < > 146*  --  77*  --  92* 29* 57*  CREATININE  --   < >  17.21*  --  11.11*  --  12.33* 5.09* 8.11*  CALCIUM  --   --  5.1*  --  6.3* 8.2* 6.6* 7.5* 7.4*  MG 2.1  --   --   --   --   --   --   --   --   PHOS  --   --   --  8.4* 5.9*  --  5.1*  --  4.8*  < > = values in this interval not displayed. Liver Function Tests:  Recent Labs Lab 04/08/15 2024 04/10/15 0850 04/11/15 1509 04/14/15 1800  AST 17  --   --   --   ALT 17  --   --   --   ALKPHOS 73  --   --   --   BILITOT 0.6  --   --   --   PROT 7.1  --   --   --   ALBUMIN 3.8 2.9* 3.5 3.1*   No results for input(s): LIPASE, AMYLASE in the last 168 hours. No results for input(s): AMMONIA in the last 168 hours. CBC:  Recent Labs Lab 04/08/15 2024  04/09/15 0624 04/10/15 0850 04/11/15 1508 04/13/15 0628 04/14/15 1800  WBC 6.7  --  6.9 5.8 6.4 6.2 9.5  NEUTROABS 4.8  --   --   --   --   --   --   HGB 5.8*  < > 5.4* 9.7* 10.4* 9.4* 8.7*  HCT 17.2*  < > 16.4* 28.0* 30.4* 28.6*  26.8*  MCV 80.4  --  82.8 80.2 80.6 84.4 87.0  PLT 131*  --  116* 123* 129* 117* 134*  < > = values in this interval not displayed. Cardiac Enzymes: No results for input(s): CKTOTAL, CKMB, CKMBINDEX, TROPONINI in the last 168 hours. BNP (last 3 results) No results for input(s): BNP in the last 8760 hours.  ProBNP (last 3 results) No results for input(s): PROBNP in the last 8760 hours.  CBG:  Recent Labs Lab 04/12/15 0621  GLUCAP 76    Recent Results (from the past 240 hour(s))  Urine culture     Status: None   Collection Time: 04/10/15  1:52 PM  Result Value Ref Range Status   Specimen Description URINE, RANDOM  Final   Special Requests NONE  Final   Culture   Final    MULTIPLE SPECIES PRESENT, SUGGEST RECOLLECTION IF CLINICALLY INDICATED   Report Status 04/12/2015 FINAL  Final     Studies: Dg Chest Port 1 View  04/14/2015   CLINICAL DATA:  Central catheter placement  EXAM: PORTABLE CHEST - 1 VIEW  COMPARISON:  None.  FINDINGS: Dual-lumen catheter placed with the distal tip at the cavoatrial junction. No pneumothorax. There is atelectasis in the left base. The lungs elsewhere clear. Heart is upper normal in size with pulmonary vascularity within normal limits. No adenopathy.  IMPRESSION: Catheter tip at cavoatrial junction. No pneumothorax. Left lower lobe atelectasis. Lungs otherwise clear.   Electronically Signed   By: Lowella Grip III M.D.   On: 04/14/2015 14:18    Scheduled Meds: . sodium chloride   Intravenous Once  . amLODipine  10 mg Oral Daily  . calcium carbonate  800 mg of elemental calcium Oral TID WC  . cyclobenzaprine  10 mg Oral TID  . darbepoetin (ARANESP) injection - DIALYSIS  200 mcg Intravenous Q Sat-HD  . doxercalciferol  6 mcg Intravenous Q T,Th,Sa-HD  . multivitamin  1 tablet Oral QHS  .  oxyCODONE       Continuous Infusions: . sodium chloride 10 mL/hr at 04/14/15 [id number]    Principal Problem:   Acute on chronic renal failure Active  Problems:   CKD (chronic kidney disease) stage 5, GFR less than 15 ml/min   Anemia of chronic kidney failure   ESRD (end stage renal disease)    Time spent: 15 min    Karla Rawl MD PhD  [facility] Pager [phone] If 7PM-7AM, please contact night-coverage at [email or web address], password [facility] 04/14/2015, 7:22 PM  LOS: 5 days

## 2015-04-14 NOTE — Anesthesia Procedure Notes (Signed)
Procedure Name: LMA Insertion Date/Time: 04/14/2015 10:42 AM Performed by: Jacquiline Doe A Pre-anesthesia Checklist: Patient identified, Emergency Drugs available, Suction available, Patient being monitored and Timeout performed Patient Re-evaluated:Patient Re-evaluated prior to inductionOxygen Delivery Method: Circle system utilized Preoxygenation: Pre-oxygenation with 100% oxygen Intubation Type: IV induction Ventilation: Mask ventilation without difficulty LMA: LMA inserted LMA Size: 3.0 Tube type: Oral Number of attempts: 1 Placement Confirmation: positive ETCO2 Tube secured with: Tape Dental Injury: Teeth and Oropharynx as per pre-operative assessment

## 2015-04-14 NOTE — Discharge Instructions (Signed)
° ° °  04/14/2015 Karla Garner [id number] 1986/09/09  Surgeon(s): Angelia Mould, MD  Procedure(s): CREATION OF RIGHT RADIOCEPHALIC ARTERIOVENOUS (AV) FISTULA  EXCHANGE OF RIGHT INTERNAL JUGULAR DIALYSIS CATHETER  x Do not stick fistula for 12 weeks

## 2015-04-14 NOTE — Care Management Note (Signed)
Case Management Note  Patient Details  Name: Karla Garner MRN: [id number] Date of Birth: Jun 14, 1986  Subjective/Objective:            CM following for progression and d/c planning.        Action/Plan: No d/c needs identified at this time. Noted that Medicaid application was initiated on 04/13/2015, await pending number so that pt may be CLIPPed.   Expected Discharge Date:       04/19/2015           Expected Discharge Plan:  Home/Self Care  In-House Referral:  NA  Discharge planning Services  NA  Post Acute Care Choice:  NA Choice offered to:  NA  DME Arranged:    DME Agency:     HH Arranged:    HH Agency:     Status of Service:  In process, will continue to follow  Medicare Important Message Given:  No Date Medicare IM Given:    Medicare IM give by:    Date Additional Medicare IM Given:    Additional Medicare Important Message give by:     If discussed at [place] of Stay Meetings, dates discussed:    Additional Comments:  Adron Bene, RN 04/14/2015, 3:15 PM

## 2015-04-15 ENCOUNTER — Telehealth: Payer: Self-pay | Admitting: Vascular Surgery

## 2015-04-15 ENCOUNTER — Encounter (HOSPITAL_COMMUNITY): Payer: Self-pay | Admitting: Vascular Surgery

## 2015-04-15 MED ORDER — DOXERCALCIFEROL 4 MCG/2ML IV SOLN
3.0000 ug | INTRAVENOUS | Status: DC
  Filled 2015-04-15: qty 2

## 2015-04-15 NOTE — Telephone Encounter (Signed)
Lm for pt re appt, dpm  °

## 2015-04-15 NOTE — Telephone Encounter (Signed)
-----   Message from Mena Goes, RN sent at 04/14/2015  4:10 PM EDT ----- Regarding: Schedule   ----- Message -----    From: Gabriel Earing, PA-C    Sent: 04/14/2015  12:27 PM      To: [facility] Charge Pool  S/p right RC AVF 04/14/15.  F/u with [facility] in 6 weeks with duplex.  Thanks, Aldona Bar

## 2015-04-15 NOTE — Progress Notes (Signed)
[facility] Rounding Note  Subjective:  TDC + Right RC AVF 6/23. No steal. Some incisional pain CLIPPED for [facility] 2nd shift and can start there on Tuesday Medicaid application done Has some mild lower abd pain Indicates there may be transportation issue getting to HD and also a child care issue Told her we can help with the transportation part  Physical Exam:  BP 128/84 mmHg  Pulse 113  Temp(Src) 98.1 F (36.7 C) (Oral)  Resp 18  Ht 4\' 8"  (1.422 m)  Wt 40.4 kg (89 lb 1.1 oz)  BMI 19.98 kg/m2  SpO2 100%  GEN: NAD.  EYES: Sclerae not icteric CV: Regular S1S2 No S3 and no murmur PULM: Lungs clear ABD: very mild tenderness to palp lower abd No rebound SKIN: no rashes,  EXT:No edema, no asterixus New Right IJ TDC (6/23) and right RC AVF with nice bruit  Scheduled Meds: . sodium chloride   Intravenous Once  . amLODipine  10 mg Oral Daily  . calcium carbonate  800 mg of elemental calcium Oral TID WC  . cyclobenzaprine  10 mg Oral TID  . darbepoetin (ARANESP) injection - DIALYSIS  200 mcg Intravenous Q Sat-HD  . doxercalciferol  6 mcg Intravenous Q T,Th,Sa-HD  . multivitamin  1 tablet Oral QHS   . sodium chloride 10 mL/hr at 04/14/15 0942   PRN Meds:.acetaminophen, oxyCODONE   No labs today  Recent Labs Lab 04/10/15 0850  04/11/15 1509 04/13/15 0628 04/14/15 1800  NA 135  --  137 136 137  K 3.4*  --  3.4* 4.2 4.6  CL 104  --  102 100* 103  CO2 21*  --  20* 25 23  GLUCOSE 102*  --  134* 96 108*  BUN 77*  --  92* 29* 57*  CREATININE 11.11*  --  12.33* 5.09* 8.11*  CALCIUM 6.3*  < > 6.6* 7.5* 7.4*  PHOS 5.9*  --  5.1*  --  4.8*  < > = values in this interval not displayed.  Recent Labs Lab 04/08/15 2024  04/11/15 1508 04/13/15 0628 04/14/15 1800  WBC 6.7  < > 6.4 6.2 9.5  NEUTROABS 4.8  --   --   --   --   HGB 5.8*  < > 10.4* 9.4* 8.7*  HCT 17.2*  < > 30.4* 28.6* 26.8*  MCV 80.4  < > 80.6 84.4 87.0  PLT 131*  < > 129* 117* 134*  <  > = values in this interval not displayed.   Results for AVETT, BUON[place]ORE (MRN [id number]) as of 04/12/2015 12:32  Ref. Range 04/11/2015 15:08  PTH Latest Ref Range: 15-65 pg/mL 275 (H)   Background: 29yo Hispanic female followed by Dr. 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 Last seen 09/2014 in our office, then lost to follow-up. Apparently had some residual GFR. She presented to the emergency room with inability to have a bowel movement. She was found to have profound azotemia, anemia, mild hyperkalemia, hypocalcemia. Now ESRD admitted with uremia (BUN >140, creatinine>17), anemia (Hb 5.8), hypocalcemia (4.9). Dialysis initiated 04/09/15  A/P 1. New ESRD 1. Primary ds IgAN 2. Started 04/09/15, s/p 4 treatments. Next HD 6/25 3. CLIPPED [facility] 2nd shift and can start there on Tuesday (should arrive at around 10AM to sign paperwork) 4. 4 hours,2K3.5 Ca bath EDW 41 kg, 400/800; TDC 6/23 + maturing R RC AVF 6/23  2. Anemia 1. 2/2 CKD 2. Transfused 2u PRBC with HD #1 3. Started ESA with HD -- 200 Aranesp qSat 4. TSAT 39%, rec 2u PRBC, no Fe needed 3. CKD-BMD 1. Hypocalcemia/secondary HPT  1. Tums TIDAC 2. Started VDRA Hectorol 6qTx. (large dose was used not b/o the PTH but b/o the profound hypocalcemia. Will drop to 3 mcg  3. PTH 275 4. HTN/Vol:  1. Not much evidence at this time for volume excess. 41 will be EDW for now. 2. Added amlodipine 6/19   Jamal Maes, MD [place] Pager 04/15/2015, 10:44 AM

## 2015-04-15 NOTE — Progress Notes (Signed)
[facility] PROGRESS NOTE  Karla Garner [id number] DOB: [birth year] DOA: 04/08/2015 PCP: Pcp Not In System  Assessment/Plan: 1. New ESRD- patient started on hemodialysis, s/p right IJ tunneled dialysis catheter placement and creation of right arm AVF on 6/23 2. ?UTI- Patient had abnormal UA and was started on ceftriaxone for ? Uti, no urine culture obtained in ED. She has been afebrile with normal wbc, ceftriaxone has been discontinued. 3. Neck pain-  Improved, s/p fall prior to coming to the hospital, CT head and c spine negative, Started Flexeril 10 mg po TID for muscle spasm. 4. Anemia- secondary to CKD, s/p two units PRBC. Monitor hgb 5. Lower ab pain: reported she had history of lower ab pain during periods, currently she is having her periods, will check UA.  Code Status: Full code Family Communication: No family at bedside Disposition Plan: Home when medically stable, likely tomorrow on 6/25   Consultants:  Nephrology  Vascular surgery  Procedures:  HD  Placement of right IJ 19 cm tunneled dialysis catheter Right radial cephalic AV fistula  Antibiotics:  None  HPI/Subjective: 29 y.o. female with history of glomerulonephritis. She had been on dialysis previously since at least 2014. She had a ligation of an AV fistula due to pain in January of this year. It seems that the patient apparently had been off of dialysis for the past year and a half or so with baseline creatinine of 6.9. It seems like she has not been following up with nephrology. Patient started on hemodialysis.patient complained of neck pain, she fell few days ago before coming to hospital while coming downstairs, and hit her head and neck on the floor. She complains of headache and left sided neck pain, on movement.Also complains of numbness of the left arm. CT head and c spine were obtained, which were negative for acute abnormality.  Some lower ab pain, reported always has lower ab pain  during her periods. otherwise stable  Objective: Filed Vitals:   04/15/15 0858  BP: 128/84  Pulse: 113  Temp: 98.1 F (36.7 C)  Resp: 18    Intake/Output Summary (Last 24 hours) at 04/15/15 1857 Last data filed at 04/15/15 1309  Gross per 24 hour  Intake    600 ml  Output   4150 ml  Net  -3550 ml   Filed Weights   04/13/15 2053 04/14/15 1810 04/14/15 2145  Weight: 42 kg (92 lb 9.5 oz) 44.5 kg (98 lb 1.7 oz) 40.4 kg (89 lb 1.1 oz)    Exam:   General:  Appear in no acute distress, s/p tunneled right IJ placement  Cardiovascular: S1S2 RRR  Respiratory: Clear bilaterally, nontender  Abdomen: No edema  Musculoskeletal: Neck-  No neck tenderness to palpation, s/p creation of right AVF.     Data Reviewed: Basic Metabolic Panel:  Recent Labs Lab 04/08/15 2318  04/09/15 [id number] 04/09/15 0809 04/10/15 0850 04/11/15 1508 04/11/15 1509 04/13/15 0628 04/14/15 1800  NA  --   < > 134*  --  135  --  137 136 137  K  --   < > 5.2*  --  3.4*  --  3.4* 4.2 4.6  CL  --   < > 105  --  104  --  102 100* 103  CO2  --   --  11*  --  21*  --  20* 25 23  GLUCOSE  --   < > 89  --  102*  --  134* 96 108*  BUN  --   < > 146*  --  77*  --  92* 29* 57*  CREATININE  --   < > 17.21*  --  11.11*  --  12.33* 5.09* 8.11*  CALCIUM  --   --  5.1*  --  6.3* 8.2* 6.6* 7.5* 7.4*  MG 2.1  --   --   --   --   --   --   --   --   PHOS  --   --   --  8.4* 5.9*  --  5.1*  --  4.8*  < > = values in this interval not displayed. Liver Function Tests:  Recent Labs Lab 04/08/15 2024 04/10/15 0850 04/11/15 1509 04/14/15 1800  AST 17  --   --   --   ALT 17  --   --   --   ALKPHOS 73  --   --   --   BILITOT 0.6  --   --   --   PROT 7.1  --   --   --   ALBUMIN 3.8 2.9* 3.5 3.1*   No results for input(s): LIPASE, AMYLASE in the last 168 hours. No results for input(s): AMMONIA in the last 168 hours. CBC:  Recent Labs Lab 04/08/15 2024  04/09/15 0624 04/10/15 0850 04/11/15 1508  04/13/15 0628 04/14/15 1800  WBC 6.7  --  6.9 5.8 6.4 6.2 9.5  NEUTROABS 4.8  --   --   --   --   --   --   HGB 5.8*  < > 5.4* 9.7* 10.4* 9.4* 8.7*  HCT 17.2*  < > 16.4* 28.0* 30.4* 28.6* 26.8*  MCV 80.4  --  82.8 80.2 80.6 84.4 87.0  PLT 131*  --  116* 123* 129* 117* 134*  < > = values in this interval not displayed. Cardiac Enzymes: No results for input(s): CKTOTAL, CKMB, CKMBINDEX, TROPONINI in the last 168 hours. BNP (last 3 results) No results for input(s): BNP in the last 8760 hours.  ProBNP (last 3 results) No results for input(s): PROBNP in the last 8760 hours.  CBG:  Recent Labs Lab 04/12/15 0621  GLUCAP 76    Recent Results (from the past 240 hour(s))  Urine culture     Status: None   Collection Time: 04/10/15  1:52 PM  Result Value Ref Range Status   Specimen Description URINE, RANDOM  Final   Special Requests NONE  Final   Culture   Final    MULTIPLE SPECIES PRESENT, SUGGEST RECOLLECTION IF CLINICALLY INDICATED   Report Status 04/12/2015 FINAL  Final     Studies: Dg Chest Port 1 View  04/14/2015   CLINICAL DATA:  Central catheter placement  EXAM: PORTABLE CHEST - 1 VIEW  COMPARISON:  None.  FINDINGS: Dual-lumen catheter placed with the distal tip at the cavoatrial junction. No pneumothorax. There is atelectasis in the left base. The lungs elsewhere clear. Heart is upper normal in size with pulmonary vascularity within normal limits. No adenopathy.  IMPRESSION: Catheter tip at cavoatrial junction. No pneumothorax. Left lower lobe atelectasis. Lungs otherwise clear.   Electronically Signed   By: Lowella Grip III M.D.   On: 04/14/2015 14:18    Scheduled Meds: . sodium chloride   Intravenous Once  . amLODipine  10 mg Oral Daily  . calcium carbonate  800 mg of elemental calcium Oral TID WC  . cyclobenzaprine  10 mg Oral TID  . darbepoetin (ARANESP) injection -  DIALYSIS  200 mcg Intravenous Q Sat-HD  . [START ON 04/16/2015] doxercalciferol  3 mcg Intravenous  Q T,Th,Sa-HD  . multivitamin  1 tablet Oral QHS   Continuous Infusions: . sodium chloride 10 mL/hr at 04/14/15 [id number]    Principal Problem:   Acute on chronic renal failure Active Problems:   CKD (chronic kidney disease) stage 5, GFR less than 15 ml/min   Anemia of chronic kidney failure   ESRD (end stage renal disease)    Time spent: 15 min    Karla Thackston MD PhD  [facility] Pager [phone] If 7PM-7AM, please contact night-coverage at [email or web address], password [facility] 04/15/2015, 6:57 PM  LOS: 6 days

## 2015-04-15 NOTE — Progress Notes (Signed)
   VASCULAR SURGERY ASSESSMENT & PLAN:  * 1 Day Post-Op s/p: right radiocephalic AV fistula and tunneled dialysis catheter.  *  The fistula has an excellent thrill. Vascular surgery will be available as needed.  SUBJECTIVE: Complains of some pain and right wrist.  PHYSICAL EXAM: Filed Vitals:   04/14/15 2145 04/14/15 2218 04/15/15 0533 04/15/15 0858  BP: 158/108 166/105 142/93 128/84  Pulse: 108 109 107 113  Temp: 98.5 F (36.9 C) 98.8 F (37.1 C) 98.4 F (36.9 C) 98.1 F (36.7 C)  TempSrc: Oral Oral Oral Oral  Resp: 14 16 15 18   Height:      Weight: 89 lb 1.1 oz (40.4 kg)     SpO2: 96% 98% 99% 100%   Right radiocephalic fistula has an excellent thrill. Incision looks fine. Catheter site looks fine.  LABS: Lab Results  Component Value Date   WBC 9.5 04/14/2015   HGB 8.7* 04/14/2015   HCT 26.8* 04/14/2015   MCV 87.0 04/14/2015   PLT 134* 04/14/2015   Lab Results  Component Value Date   CREATININE 8.11* 04/14/2015   Lab Results  Component Value Date   INR 1.03 04/13/2015    Principal Problem:   Acute on chronic renal failure Active Problems:   CKD (chronic kidney disease) stage 5, GFR less than 15 ml/min   Anemia of chronic kidney failure   ESRD (end stage renal disease)    Gae Gallop [id number] 04/15/2015

## 2015-04-16 DIAGNOSIS — I10 Essential (primary) hypertension: Secondary | ICD-10-CM

## 2015-04-16 LAB — CBC
HEMATOCRIT: 25.5 % — AB (ref 36.0–46.0)
Hemoglobin: 8.3 g/dL — ABNORMAL LOW (ref 12.0–15.0)
MCH: 27.9 pg (ref 26.0–34.0)
MCHC: 32.5 g/dL (ref 30.0–36.0)
MCV: 85.9 fL (ref 78.0–100.0)
Platelets: 80 10*3/uL — ABNORMAL LOW (ref 150–400)
RBC: 2.97 MIL/uL — ABNORMAL LOW (ref 3.87–5.11)
RDW: 15.6 % — AB (ref 11.5–15.5)
WBC: 3.7 10*3/uL — ABNORMAL LOW (ref 4.0–10.5)

## 2015-04-16 LAB — URINALYSIS, ROUTINE W REFLEX MICROSCOPIC
Bilirubin Urine: NEGATIVE
Glucose, UA: 100 mg/dL — AB
Hgb urine dipstick: NEGATIVE
Ketones, ur: NEGATIVE mg/dL
NITRITE: NEGATIVE
PH: 8 (ref 5.0–8.0)
Protein, ur: 100 mg/dL — AB
Specific Gravity, Urine: 1.005 (ref 1.005–1.030)
Urobilinogen, UA: 0.2 mg/dL (ref 0.0–1.0)

## 2015-04-16 LAB — RENAL FUNCTION PANEL
Albumin: 2.9 g/dL — ABNORMAL LOW (ref 3.5–5.0)
Anion gap: 5 (ref 5–15)
BUN: 12 mg/dL (ref 6–20)
CHLORIDE: 100 mmol/L — AB (ref 101–111)
CO2: 30 mmol/L (ref 22–32)
Calcium: 9.6 mg/dL (ref 8.9–10.3)
Creatinine, Ser: 2.05 mg/dL — ABNORMAL HIGH (ref 0.44–1.00)
GFR calc Af Amer: 37 mL/min — ABNORMAL LOW (ref >60)
GFR, EST NON AFRICAN AMERICAN: 32 mL/min — AB (ref >60)
Glucose, Bld: 91 mg/dL (ref 65–99)
Phosphorus: 2 mg/dL — ABNORMAL LOW (ref 2.5–4.6)
Potassium: 2.9 mmol/L — ABNORMAL LOW (ref 3.5–5.1)
Sodium: 135 mmol/L (ref 135–145)

## 2015-04-16 LAB — URINE MICROSCOPIC-ADD ON

## 2015-04-16 MED ORDER — PNEUMOCOCCAL VAC POLYVALENT 25 MCG/0.5ML IJ INJ
0.5000 mL | INJECTION | INTRAMUSCULAR | Status: AC
  Administered 2015-04-16: 0.5 mL via INTRAMUSCULAR
  Filled 2015-04-16: qty 0.5

## 2015-04-16 MED ORDER — DOXERCALCIFEROL 4 MCG/2ML IV SOLN
INTRAVENOUS | Status: AC
  Administered 2015-04-16: 3 ug
  Filled 2015-04-16: qty 2

## 2015-04-16 MED ORDER — AMLODIPINE BESYLATE 10 MG PO TABS: 10.0000 mg | ORAL_TABLET | Freq: Every day | ORAL | Status: DC

## 2015-04-16 MED ORDER — DARBEPOETIN ALFA 200 MCG/0.4ML IJ SOSY
PREFILLED_SYRINGE | INTRAMUSCULAR | Status: AC
  Administered 2015-04-16: 200 ug
  Filled 2015-04-16: qty 0.4

## 2015-04-16 MED ORDER — PNEUMOCOCCAL VAC POLYVALENT 25 MCG/0.5ML IJ INJ
0.5000 mL | INJECTION | INTRAMUSCULAR | Status: DC
Start: 2015-04-17 — End: 2015-04-16

## 2015-04-16 NOTE — Discharge Summary (Addendum)
Discharge Summary  Karla Garner [id number] DOB: 01-13-1986  PCP: Pcp Not In System  Admit date: 04/08/2015 Discharge date: 04/16/2015  Time spent: <34mins  Recommendations for Outpatient Follow-up:  1. Patient to establish primary care physician 2. F/u with nephrology Dr. Edrick Oh for hospital discharge follow up 3. F/u with vascular surgery in 6weeks  Discharge Diagnoses:  Active Hospital Problems   Diagnosis Date Noted  . Acute on chronic renal failure 02/23/2013  . Anemia of chronic kidney failure 04/09/2015  . CKD (chronic kidney disease) stage 5, GFR less than 15 ml/min 04/09/2015  . ESRD (end stage renal disease) 04/09/2015    Resolved Hospital Problems   Diagnosis Date Noted Date Resolved  No resolved problems to display.    Discharge Condition: stable  Diet recommendation: renal diet  Filed Weights   04/15/15 2100 04/16/15 0750 04/16/15 1203  Weight: 40.8 kg (89 lb 15.2 oz) 42.2 kg (93 lb 0.6 oz) 41.1 kg (90 lb 9.7 oz)    History of present illness:  Karla Garner is a 29 y.o. female with history of glomerulonephritis. She had been on dialysis previously since at least 2014. She had a ligation of an AV fistula due to pain in January of this year. It seems that the patient apparently had been off of dialysis for the past year and a half or so with baseline creatinine of 6.9. It seems like she has not been following up with nephrology frequently either.  Today she presents to the ED with 1 week history of low abdominal pain constipation, and not feeling well. History is somewhat difficult due to language barrier.   Hospital Course:  Principal Problem:   Acute on chronic renal failure Active Problems:   CKD (chronic kidney disease) stage 5, GFR less than 15 ml/min   Anemia of chronic kidney failure   ESRD (end stage renal disease)  New ESRD- crescentic IgA nephropathy         patient started on hemodialysis, s/p right IJ tunneled  dialysis catheter placement and creation of right arm AVF on 6/23  ?UTI- Patient had abnormal UA and was started on ceftriaxone for ? Uti, no urine culture obtained in ED. She has been afebrile with normal wbc, ceftriaxone has been discontinued.  Neck pain- Improved, s/p fall prior to coming to the hospital, CT head and c spine negative, Started Flexeril 10 mg po TID for muscle spasm.  Anemia- secondary to CKD, s/p two units PRBC. Monitor hgb  Lower ab pain: reported she had history of lower ab pain during periods, currently she is having her periods,  UA/urine culture no infection. Pain resolved at discharge  HTN: started norvasc.  Code Status: Full code Family Communication: No family at bedside Disposition Plan: Home    Consultants:  Nephrology  Vascular surgery  Procedures:  HD Placement of right IJ 19 cm tunneled dialysis catheter Right radial cephalic AV fistula  Antibiotics: None   Discharge Exam: BP 151/89 mmHg  Pulse 102  Temp(Src) 98.4 F (36.9 C) (Oral)  Resp 19  Ht 4\' 8"  (1.422 m)  Wt 41.1 kg (90 lb 9.7 oz)  BMI 20.33 kg/m2  SpO2 100%   General: Appear in no acute distress, s/p tunneled right IJ placement  Cardiovascular: S1S2 RRR  Respiratory: Clear bilaterally, nontender  Abdomen: No edema  Musculoskeletal: Neck- No neck tenderness to palpation, s/p creation of right AVF.    Discharge Instructions You were cared for by a hospitalist during your hospital stay.  If you have any questions about your discharge medications or the care you received while you were in the hospital after you are discharged, you can call the unit and asked to speak with the hospitalist on call if the hospitalist that took care of you is not available. Once you are discharged, your primary care physician will handle any further medical issues. Please note that NO REFILLS for any discharge medications will be authorized once you are discharged, as it is imperative that  you return to your primary care physician (or establish a relationship with a primary care physician if you do not have one) for your aftercare needs so that they can reassess your need for medications and monitor your lab values.     Medication List    STOP taking these medications        oxyCODONE 5 MG immediate release tablet  Commonly known as:  ROXICODONE      TAKE these medications        amLODipine 10 MG tablet  Commonly known as:  NORVASC  Take 1 tablet (10 mg total) by mouth daily.       No Known Allergies Follow-up Information    Follow up with Deitra Mayo, MD In 6 weeks.   Specialties:  Vascular Surgery, Cardiology   Why:  Office will call you to arrange your appt (sent)   Contact information:   [place] [address] [phone]       Follow up with Sherril Croon, MD In 2 weeks.   Specialty:  Nephrology   Why:  post hospitalization follow up   Contact information:   [place] [address] [phone]        The results of significant diagnostics from this hospitalization (including imaging, microbiology, ancillary and laboratory) are listed below for reference.    Significant Diagnostic Studies: Ct Head Wo Contrast  04/11/2015   CLINICAL DATA:  [place] approximately 25 days ago, back of the head pain, neck pain  EXAM: CT HEAD WITHOUT CONTRAST  CT CERVICAL SPINE WITHOUT CONTRAST  TECHNIQUE: Multidetector CT imaging of the head and cervical spine was performed following the standard protocol without intravenous contrast. Multiplanar CT image reconstructions of the cervical spine were also generated.  COMPARISON:  None.  FINDINGS: CT HEAD FINDINGS  No skull fracture is noted. Paranasal sinuses and mastoid air cells are unremarkable.  No hydrocephalus. No intracranial hemorrhage, mass effect or midline shift.  No acute cortical infarction. No mass lesion is noted on this unenhanced scan. The gray and white-matter differentiation is  preserved.  CT CERVICAL SPINE FINDINGS  Axial images of the cervical spine shows no acute fracture or subluxation. Alignment, disc spaces and vertebral body heights are preserved. There is no pneumothorax in visualized lung apices.  Computer processed images shows no acute fracture or subluxation. No prevertebral soft tissue swelling. Cervical airway is patent.  IMPRESSION: 1. No acute intracranial abnormality. 2. No cervical spine acute fracture or subluxation.   Electronically Signed   By: Lahoma Crocker M.D.   On: 04/11/2015 11:22   Ct Cervical Spine Wo Contrast  04/11/2015   CLINICAL DATA:  [place] approximately 25 days ago, back of the head pain, neck pain  EXAM: CT HEAD WITHOUT CONTRAST  CT CERVICAL SPINE WITHOUT CONTRAST  TECHNIQUE: Multidetector CT imaging of the head and cervical spine was performed following the standard protocol without intravenous contrast. Multiplanar CT image reconstructions of the cervical spine were also generated.  COMPARISON:  None.  FINDINGS: CT HEAD FINDINGS  No skull fracture is noted. Paranasal sinuses and mastoid air cells are unremarkable.  No hydrocephalus. No intracranial hemorrhage, mass effect or midline shift.  No acute cortical infarction. No mass lesion is noted on this unenhanced scan. The gray and white-matter differentiation is preserved.  CT CERVICAL SPINE FINDINGS  Axial images of the cervical spine shows no acute fracture or subluxation. Alignment, disc spaces and vertebral body heights are preserved. There is no pneumothorax in visualized lung apices.  Computer processed images shows no acute fracture or subluxation. No prevertebral soft tissue swelling. Cervical airway is patent.  IMPRESSION: 1. No acute intracranial abnormality. 2. No cervical spine acute fracture or subluxation.   Electronically Signed   By: Lahoma Crocker M.D.   On: 04/11/2015 11:22   Ir Fluoro Guide Cv Line Right  04/09/2015   CLINICAL DATA:  Acute renal failure  EXAM: IR RIGHT FLOURO GUIDE CV  LINE; IR ULTRASOUND GUIDANCE VASC ACCESS RIGHT  FLUOROSCOPY TIME:  12 seconds  MEDICATIONS AND MEDICAL HISTORY: Versed 0 mg, Fentanyl 25 mcg.  Additional Medications: None.  ANESTHESIA/SEDATION: Moderate sedation time: 10 minutes  CONTRAST:  None  PROCEDURE: The procedure, risks, benefits, and alternatives were explained to the patient. Questions regarding the procedure were encouraged and answered. The patient understands and consents to the procedure.  The right neck was prepped with Betadine in a sterile fashion, and a sterile drape was applied covering the operative field. A sterile gown and sterile gloves were used for the procedure.  Under sonographic guidance, a micropuncture needle was inserted into the right internal jugular vein and removed over a 018 wire which was up sized to a 3 J. The dilator followed by the temporary catheter were advanced over the wire to the cavoatrial junction. It was flushed and sewn in place. Heparinized saline was instilled.  FINDINGS: Tip of the temporary dialysis catheter is at the cavoatrial junction.  COMPLICATIONS: None  IMPRESSION: Successful right internal jugular temporary dialysis catheter placement with its tip at the cavoatrial junction.   Electronically Signed   By: Marybelle Killings M.D.   On: 04/09/2015 10:20   Ir US Guide Vasc Access Right  04/09/2015   CLINICAL DATA:  Acute renal failure  EXAM: IR RIGHT FLOURO GUIDE CV LINE; IR ULTRASOUND GUIDANCE VASC ACCESS RIGHT  FLUOROSCOPY TIME:  12 seconds  MEDICATIONS AND MEDICAL HISTORY: Versed 0 mg, Fentanyl 25 mcg.  Additional Medications: None.  ANESTHESIA/SEDATION: Moderate sedation time: 10 minutes  CONTRAST:  None  PROCEDURE: The procedure, risks, benefits, and alternatives were explained to the patient. Questions regarding the procedure were encouraged and answered. The patient understands and consents to the procedure.  The right neck was prepped with Betadine in a sterile fashion, and a sterile drape was applied  covering the operative field. A sterile gown and sterile gloves were used for the procedure.  Under sonographic guidance, a micropuncture needle was inserted into the right internal jugular vein and removed over a 018 wire which was up sized to a 3 J. The dilator followed by the temporary catheter were advanced over the wire to the cavoatrial junction. It was flushed and sewn in place. Heparinized saline was instilled.  FINDINGS: Tip of the temporary dialysis catheter is at the cavoatrial junction.  COMPLICATIONS: None  IMPRESSION: Successful right internal jugular temporary dialysis catheter placement with its tip at the cavoatrial junction.   Electronically Signed   By: Marybelle Killings M.D.   On: 04/09/2015  10:20   Dg Chest Port 1 View  04/14/2015   CLINICAL DATA:  Central catheter placement  EXAM: PORTABLE CHEST - 1 VIEW  COMPARISON:  None.  FINDINGS: Dual-lumen catheter placed with the distal tip at the cavoatrial junction. No pneumothorax. There is atelectasis in the left base. The lungs elsewhere clear. Heart is upper normal in size with pulmonary vascularity within normal limits. No adenopathy.  IMPRESSION: Catheter tip at cavoatrial junction. No pneumothorax. Left lower lobe atelectasis. Lungs otherwise clear.   Electronically Signed   By: Lowella Grip III M.D.   On: 04/14/2015 14:18    Microbiology: Recent Results (from the past 240 hour(s))  Urine culture     Status: None   Collection Time: 04/10/15  1:52 PM  Result Value Ref Range Status   Specimen Description URINE, RANDOM  Final   Special Requests NONE  Final   Culture   Final    MULTIPLE SPECIES PRESENT, SUGGEST RECOLLECTION IF CLINICALLY INDICATED   Report Status 04/12/2015 FINAL  Final     Labs: Basic Metabolic Panel:  Recent Labs Lab 04/10/15 0850 04/11/15 1508 04/11/15 1509 04/13/15 0628 04/14/15 1800 04/16/15 1024  NA 135  --  137 136 137 135  K 3.4*  --  3.4* 4.2 4.6 2.9*  CL 104  --  102 100* 103 100*  CO2 21*   --  20* 25 23 30   GLUCOSE 102*  --  134* 96 108* 91  BUN 77*  --  92* 29* 57* 12  CREATININE 11.11*  --  12.33* 5.09* 8.11* 2.05*  CALCIUM 6.3* 8.2* 6.6* 7.5* 7.4* 9.6  PHOS 5.9*  --  5.1*  --  4.8* 2.0*   Liver Function Tests:  Recent Labs Lab 04/10/15 0850 04/11/15 1509 04/14/15 1800 04/16/15 1024  ALBUMIN 2.9* 3.5 3.1* 2.9*   No results for input(s): LIPASE, AMYLASE in the last 168 hours. No results for input(s): AMMONIA in the last 168 hours. CBC:  Recent Labs Lab 04/10/15 0850 04/11/15 1508 04/13/15 0628 04/14/15 1800 04/16/15 1023  WBC 5.8 6.4 6.2 9.5 3.7*  HGB 9.7* 10.4* 9.4* 8.7* 8.3*  HCT 28.0* 30.4* 28.6* 26.8* 25.5*  MCV 80.2 80.6 84.4 87.0 85.9  PLT 123* 129* 117* 134* 80*   Cardiac Enzymes: No results for input(s): CKTOTAL, CKMB, CKMBINDEX, TROPONINI in the last 168 hours. BNP: BNP (last 3 results) No results for input(s): BNP in the last 8760 hours.  ProBNP (last 3 results) No results for input(s): PROBNP in the last 8760 hours.  CBG:  Recent Labs Lab 04/12/15 0621  GLUCAP 76       Signed:  Nakeisha Greenhouse MD, PhD  [facility] 04/16/2015, 1:37 PM

## 2015-04-16 NOTE — Progress Notes (Signed)
Used [employer] to communicate with patient regarding head to to assessment and medication administration. Also ordered patient's lunch tray.  Joellen Jersey, RN.

## 2015-04-16 NOTE — Progress Notes (Signed)
Patient to be discharged to home. [employer] used to explain discharge instructions, follow up appts, new medications, caring for dialysis accesses, going to outpatient dialysis center, and being on a renal diet. Answered all patient's questions using interpreter line.   Joellen Jersey, RN.

## 2015-04-16 NOTE — Procedures (Signed)
I have personally attended this patient's dialysis session.   Goal weight 41 kg BP stable TDC running at 400 AVF nice bruit and thrill  Jamal Maes, MD [facility] [phone] Pager 04/16/2015, 8:44 AM

## 2015-04-16 NOTE — Progress Notes (Signed)
[facility] Rounding Note  Subjective:  TDC + Right RC AVF 6/23. No steal. CLIPPED for [facility] TTS 2nd shift and can start there on Tuesday Medicaid application done Via interpreter - no pain or discomfort and no new problems today Getting dialysis now - [facility] running well at 400  Physical Exam:  BP 133/86 mmHg  Pulse 91  Temp(Src) 98.3 F (36.8 C) (Oral)  Resp 16  Ht 4\' 8"  (1.422 m)  Wt 42.2 kg (93 lb 0.6 oz)  BMI 20.87 kg/m2  SpO2 98%  GEN: NAD. Pleasant. EYES: Sclerae not icteric CV: Regular S1S2 No S3 and no murmur PULM: Lungs clear ABD: No tenderness SKIN: no rashes,  EXT:No edema, no asterixus Right IJ TDC (6/23) running well and right RC AVF with nice bruit  Scheduled Meds: . sodium chloride   Intravenous Once  . amLODipine  10 mg Oral Daily  . calcium carbonate  800 mg of elemental calcium Oral TID WC  . cyclobenzaprine  10 mg Oral TID  . darbepoetin (ARANESP) injection - DIALYSIS  200 mcg Intravenous Q Sat-HD  . doxercalciferol  3 mcg Intravenous Q T,Th,Sa-HD  . multivitamin  1 tablet Oral QHS  . [START ON 04/17/2015] pneumococcal 23 valent vaccine  0.5 mL Intramuscular Tomorrow-1000   . sodium chloride 10 mL/hr at 04/14/15 0942   PRN Meds:.acetaminophen, oxyCODONE   Labs pending from today  Recent Labs Lab 04/10/15 0850  04/11/15 1509 04/13/15 0628 04/14/15 1800  NA 135  --  137 136 137  K 3.4*  --  3.4* 4.2 4.6  CL 104  --  102 100* 103  CO2 21*  --  20* 25 23  GLUCOSE 102*  --  134* 96 108*  BUN 77*  --  92* 29* 57*  CREATININE 11.11*  --  12.33* 5.09* 8.11*  CALCIUM 6.3*  < > 6.6* 7.5* 7.4*  PHOS 5.9*  --  5.1*  --  4.8*  < > = values in this interval not displayed.  Recent Labs Lab 04/11/15 1508 04/13/15 0628 04/14/15 1800  WBC 6.4 6.2 9.5  HGB 10.4* 9.4* 8.7*  HCT 30.4* 28.6* 26.8*  MCV 80.6 84.4 87.0  PLT 129* 117* 134*     Results for KETURA, SALATA (MRN [id number]) as of 04/12/2015 12:32  Ref. Range  04/11/2015 15:08  PTH Latest Ref Range: 15-65 pg/mL 275 (H)   Background: 29yo Hispanic female followed by Dr. Justin Mend for CKD 2/2 crescentic IgA nephropathy, previously started HD at [facility] and subsequently opted to discontinue HD. Last seen 09/2014 in our office, then lost to follow-up. Apparently had some residual GFR. She presented to the emergency room with inability to have a bowel movement. She was found to have profound azotemia, anemia, mild hyperkalemia, hypocalcemia. Now ESRD admitted with uremia (BUN >140, creatinine>17), anemia (Hb 5.8), hypocalcemia (4.9). Dialysis initiated 04/09/15  A/P 1. New ESRD d/t IgA nephropathy 1. Started 04/09/15 2. CLIPPED [facility] TTS 2nd shift and can start there on Tuesday (should arrive at around 10AM to sign paperwork) 3. 4 hours,2K3.5 Ca bath EDW 41 kg, 400/800; TDC 6/23 + maturing R RC AVF 6/23. EDW 41 kg. Tight hep for 2 weeks then standard  2. Anemia 1. Transfused 2u PRBC with HD #1 2. Started ESA with HD -- 200 Aranesp qSat 3. TSAT 39%, rec 2u PRBC, no Fe needed 3. CKD-BMD 1. Hypocalcemia/secondary HPT  1. Tums TIDAC 2. Initially Hectorol 6qTx. (large dose was used not b/o  the PTH but b/o the profound hypocalcemia). Dropped  to 3 mcg  3. PTH 275 4. HTN/Vol:  1. No evidence at this time for volume excess. 41 will be EDW for now. 2. Added amlodipine 6/19  5. Disposition - from a renal standpoint could be discharged to home after HD and go to [facility] for her next treatment on Tuesday.   Jamal Maes, MD [facility] [phone] Pager 04/16/2015, 8:39 AM

## 2015-04-19 DIAGNOSIS — Z111 Encounter for screening for respiratory tuberculosis: Secondary | ICD-10-CM | POA: Insufficient documentation

## 2015-04-19 DIAGNOSIS — N042 Nephrotic syndrome with diffuse membranous glomerulonephritis, unspecified: Secondary | ICD-10-CM | POA: Insufficient documentation

## 2015-04-19 DIAGNOSIS — D689 Coagulation defect, unspecified: Secondary | ICD-10-CM | POA: Insufficient documentation

## 2015-04-19 DIAGNOSIS — D631 Anemia in chronic kidney disease: Secondary | ICD-10-CM | POA: Diagnosis present

## 2015-04-19 DIAGNOSIS — N2581 Secondary hyperparathyroidism of renal origin: Secondary | ICD-10-CM | POA: Diagnosis present

## 2015-04-28 DIAGNOSIS — D509 Iron deficiency anemia, unspecified: Secondary | ICD-10-CM | POA: Insufficient documentation

## 2015-05-23 ENCOUNTER — Encounter: Payer: Self-pay | Admitting: Vascular Surgery

## 2015-05-25 ENCOUNTER — Encounter: Payer: Self-pay | Admitting: Vascular Surgery

## 2015-05-25 ENCOUNTER — Ambulatory Visit (INDEPENDENT_AMBULATORY_CARE_PROVIDER_SITE_OTHER): Payer: Self-pay | Admitting: Vascular Surgery

## 2015-05-25 ENCOUNTER — Ambulatory Visit (HOSPITAL_COMMUNITY)
Admission: RE | Admit: 2015-05-25 | Discharge: 2015-05-25 | Disposition: A | Discharge status: Home / Self Care | Payer: MEDICAID | Source: Ambulatory Visit | Attending: Vascular Surgery | Admitting: Vascular Surgery

## 2015-05-25 VITALS — BP 125/80 | HR 88 | Temp 98.1°F | Resp 12 | Ht 59.0 in | Wt 85.0 lb

## 2015-05-25 DIAGNOSIS — Z4931 Encounter for adequacy testing for hemodialysis: Secondary | ICD-10-CM | POA: Insufficient documentation

## 2015-05-25 DIAGNOSIS — N186 End stage renal disease: Secondary | ICD-10-CM | POA: Insufficient documentation

## 2015-05-25 DIAGNOSIS — N185 Chronic kidney disease, stage 5: Secondary | ICD-10-CM

## 2015-05-25 NOTE — Progress Notes (Signed)
    Postoperative Access Visit   History of Present Illness  Karla Garner is a 29 y.o. year old female who presents for postoperative follow-up for: right radiocephalic AV fistula (Date: 04/14/15).  The patient's wounds are healed.  The patient notes no steal symptoms.  She is currently on dialysis via a right IJ tunneled dialysis catheter on Tuesdays, Thursdays, and Saturdays  For VQI Use Only  PRE-ADM LIVING: Home  AMB STATUS: Ambulatory  Physical Examination Filed Vitals:   05/25/15 1340  BP: 125/80  Pulse: 88  Temp: 98.1 F (36.7 C)  Resp: 12    RUE: Incision is healed, skin feels warm, hand grip is 5/5, sensation in digits is intact, palpable thrill, bruit can be auscultated   Medical Decision Making  [name] is a 29 y.o. year old female who presents s/p right radiocephalic AV fistula. She is HD TTS via right IJ TDC.  The patient's access is not yet ready for use. Her dialysis duplex today revealed diameters between 0.46 cm to 0.52 cm.   The fistula must be 0.6 cm in order to be cannulated.  Follow up in 6 weeks with repeat duplex to evaluate maturation.   Virgina Jock, PA-C [facility] [place] Office: [phone]  This patient was seen and examined in conjunction with Dr. Scot Dock.   05/25/2015, 1:58 PM

## 2015-05-26 NOTE — Addendum Note (Signed)
Addended by: Dorthula Rue L on: 05/26/2015 03:31 PM   Modules accepted: Orders

## 2015-06-01 DIAGNOSIS — R0602 Shortness of breath: Secondary | ICD-10-CM | POA: Insufficient documentation

## 2015-06-08 DIAGNOSIS — R52 Pain, unspecified: Secondary | ICD-10-CM | POA: Insufficient documentation

## 2015-06-21 DIAGNOSIS — Z2839 Other underimmunization status: Secondary | ICD-10-CM | POA: Insufficient documentation

## 2015-06-24 ENCOUNTER — Non-Acute Institutional Stay (HOSPITAL_COMMUNITY)
Admission: EM | Admit: 2015-06-24 | Discharge: 2015-06-28 | Payer: Medicaid Other | Attending: Nephrology | Admitting: Nephrology

## 2015-06-24 ENCOUNTER — Encounter (HOSPITAL_COMMUNITY): Payer: Self-pay | Admitting: Emergency Medicine

## 2015-06-24 DIAGNOSIS — R45851 Suicidal ideations: Secondary | ICD-10-CM

## 2015-06-24 DIAGNOSIS — F322 Major depressive disorder, single episode, severe without psychotic features: Secondary | ICD-10-CM | POA: Insufficient documentation

## 2015-06-24 DIAGNOSIS — Z992 Dependence on renal dialysis: Secondary | ICD-10-CM | POA: Insufficient documentation

## 2015-06-24 DIAGNOSIS — I120 Hypertensive chronic kidney disease with stage 5 chronic kidney disease or end stage renal disease: Secondary | ICD-10-CM | POA: Insufficient documentation

## 2015-06-24 DIAGNOSIS — N186 End stage renal disease: Secondary | ICD-10-CM | POA: Insufficient documentation

## 2015-06-24 HISTORY — DX: Dependence on renal dialysis: Z99.2

## 2015-06-24 LAB — CBC WITH DIFFERENTIAL/PLATELET
Basophils Absolute: 0 10*3/uL (ref 0.0–0.1)
Basophils Relative: 0 % (ref 0–1)
EOS ABS: 0 10*3/uL (ref 0.0–0.7)
Eosinophils Relative: 0 % (ref 0–5)
HEMATOCRIT: 39.2 % (ref 36.0–46.0)
HEMOGLOBIN: 12.9 g/dL (ref 12.0–15.0)
LYMPHS ABS: 1.3 10*3/uL (ref 0.7–4.0)
LYMPHS PCT: 7 % — AB (ref 12–46)
MCH: 29.7 pg (ref 26.0–34.0)
MCHC: 32.9 g/dL (ref 30.0–36.0)
MCV: 90.1 fL (ref 78.0–100.0)
Monocytes Absolute: 0.7 10*3/uL (ref 0.1–1.0)
Monocytes Relative: 4 % (ref 3–12)
NEUTROS ABS: 16.4 10*3/uL — AB (ref 1.7–7.7)
NEUTROS PCT: 89 % — AB (ref 43–77)
Platelets: 190 10*3/uL (ref 150–400)
RBC: 4.35 MIL/uL (ref 3.87–5.11)
RDW: 14.2 % (ref 11.5–15.5)
WBC: 18.4 10*3/uL — AB (ref 4.0–10.5)

## 2015-06-24 LAB — COMPREHENSIVE METABOLIC PANEL
ALBUMIN: 4.7 g/dL (ref 3.5–5.0)
ALT: 14 U/L (ref 14–54)
ANION GAP: 15 (ref 5–15)
AST: 21 U/L (ref 15–41)
Alkaline Phosphatase: 118 U/L (ref 38–126)
BILIRUBIN TOTAL: 0.6 mg/dL (ref 0.3–1.2)
BUN: 32 mg/dL — ABNORMAL HIGH (ref 6–20)
CO2: 23 mmol/L (ref 22–32)
Calcium: 9.9 mg/dL (ref 8.9–10.3)
Chloride: 100 mmol/L — ABNORMAL LOW (ref 101–111)
Creatinine, Ser: 6.58 mg/dL — ABNORMAL HIGH (ref 0.44–1.00)
GFR, EST AFRICAN AMERICAN: 9 mL/min — AB (ref >60)
GFR, EST NON AFRICAN AMERICAN: 8 mL/min — AB (ref >60)
Glucose, Bld: 91 mg/dL (ref 65–99)
POTASSIUM: 4.1 mmol/L (ref 3.5–5.1)
Sodium: 138 mmol/L (ref 135–145)
TOTAL PROTEIN: 8.4 g/dL — AB (ref 6.5–8.1)

## 2015-06-24 LAB — ACETAMINOPHEN LEVEL

## 2015-06-24 LAB — ETHANOL: Alcohol, Ethyl (B): 149 mg/dL — ABNORMAL HIGH (ref <5)

## 2015-06-24 LAB — SALICYLATE LEVEL

## 2015-06-24 LAB — PROTIME-INR
INR: 1.06 (ref 0.00–1.49)
PROTHROMBIN TIME: 14 s (ref 11.6–15.2)

## 2015-06-24 MED ORDER — ACETAMINOPHEN 325 MG PO TABS: 650.0000 mg | ORAL_TABLET | ORAL | Status: DC | PRN

## 2015-06-24 NOTE — ED Notes (Signed)
Bed: WA06 Expected date:  Expected time:  Means of arrival:  Comments: EMS-psych/possible OD?

## 2015-06-24 NOTE — ED Notes (Signed)
Report received from [employer]. Pt. Alert in no distress denies SI, HI, AVH and pain.  Pt. Instructed to come to me with problems or concerns.Will continue to monitor for safety via security cameras and Q 15 minute checks.

## 2015-06-24 NOTE — ED Notes (Signed)
Pt. Noted sleeping in room. No complaints or concerns voiced. No distress or abnormal behavior noted. Will continue to monitor with security cameras. Q 15 minute rounds continue. 

## 2015-06-24 NOTE — ED Notes (Signed)
Pt. Noted in room with visitor. No complaints or concerns voiced. No distress or abnormal behavior noted. Will continue to monitor with security cameras. Q 15 minute rounds continue.

## 2015-06-24 NOTE — ED Notes (Signed)
Bed: [facility] Expected date: 06/24/15 Expected time:  Means of arrival:  Comments: [facility]

## 2015-06-24 NOTE — ED Notes (Signed)
Spoke with Karla Garner, [facility], regarding pt's a/v fistula status. Was advised that pt could leave unit for dialysis and, as long as she was ambulatory, could come back to unit.

## 2015-06-24 NOTE — ED Notes (Signed)
Tamella was oriented to [facility]. Pt assessed via interpreter. Pt denies SI/HI/AVH and pain. She voiced no needs. Fluids given. Pt now with TTS. Will continue to monitor for needs/safety.

## 2015-06-24 NOTE — BH Assessment (Addendum)
Spoke with Dr. Thurnell Garbe who states that the police went to the patients house and needed to knock down the door because she was in the bathroom stating that she was going to overdose on pills and alcohol. The police came in and she stated that she did not take the pills because "God wouldn't let me" police report that patient does not appear to be delusional. Patient denies this upon assessment. Informed Dr. Thurnell Garbe of disposition.  Rosalin Hawking, LCSW [facility] [place] 06/24/2015 7:03 PM
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
## 2015-06-24 NOTE — BH Assessment (Signed)
Consulted with Waylan Boga, DNP who recommends that patient be observed overnight and evaluated by psychiatry in the morning. Informed psych EDP of disposition.   Rosalin Hawking, LCSW Therapeutic Triage Specialist [place] 06/24/2015 7:03 PM

## 2015-06-25 DIAGNOSIS — F322 Major depressive disorder, single episode, severe without psychotic features: Secondary | ICD-10-CM | POA: Diagnosis not present

## 2015-06-25 DIAGNOSIS — R45851 Suicidal ideations: Secondary | ICD-10-CM

## 2015-06-25 LAB — RAPID URINE DRUG SCREEN, HOSP PERFORMED
AMPHETAMINES: NOT DETECTED
BENZODIAZEPINES: NOT DETECTED
Barbiturates: NOT DETECTED
Cocaine: NOT DETECTED
Opiates: NOT DETECTED
TETRAHYDROCANNABINOL: NOT DETECTED

## 2015-06-25 LAB — PREGNANCY, URINE: PREG TEST UR: NEGATIVE

## 2015-06-25 MED ORDER — FLUOXETINE HCL 10 MG PO CAPS
10.0000 mg | ORAL_CAPSULE | Freq: Every day | ORAL | Status: DC
  Administered 2015-06-25 – 2015-06-28 (×4): 10 mg via ORAL
  Filled 2015-06-25 (×4): qty 1

## 2015-06-25 MED ORDER — TRAZODONE HCL 50 MG PO TABS
50.0000 mg | ORAL_TABLET | Freq: Every evening | ORAL | Status: DC | PRN
  Administered 2015-06-26: 50 mg via ORAL
  Filled 2015-06-25: qty 1

## 2015-06-25 MED ORDER — DOXERCALCIFEROL 4 MCG/2ML IV SOLN
4.0000 ug | INTRAVENOUS | Status: DC
  Administered 2015-06-26: 4 ug via INTRAVENOUS

## 2015-06-25 MED ORDER — AMLODIPINE BESYLATE 10 MG PO TABS
10.0000 mg | ORAL_TABLET | Freq: Every day | ORAL | Status: DC
Start: 2015-06-25 — End: 2015-06-28
  Administered 2015-06-25 – 2015-06-28 (×3): 10 mg via ORAL
  Filled 2015-06-25 (×4): qty 1

## 2015-06-25 MED ORDER — CALCIUM ACETATE (PHOS BINDER) 667 MG PO CAPS
667.0000 mg | ORAL_CAPSULE | Freq: Three times a day (TID) | ORAL | Status: DC
  Administered 2015-06-25 – 2015-06-28 (×8): 667 mg via ORAL
  Filled 2015-06-25 (×12): qty 1

## 2015-06-25 NOTE — Progress Notes (Signed)
11:32am. CSW consulted by psychiatry to assist in arranging dialysis for patient.   CSW spoke with Raelene Bott, renal PA (pager: [phone]). Pt is pt with [facility] dialysis. Dialysis schedule is quite full today, but team is going to work patient in as soon as possible. Dialysis team will call our nursing team when a spot is available and then patient can transfer over to [facility] for treatment. CSW made RN aware.  CSW available for further consult if needed.  [place] Worker [place] [facility] phone: [phone]

## 2015-06-25 NOTE — ED Notes (Signed)
Pt. Noted sleeping in room. No complaints or concerns voiced. No distress or abnormal behavior noted. Will continue to monitor with security cameras. Q 15 minute rounds continue. 

## 2015-06-25 NOTE — ED Notes (Signed)
CSW into see 

## 2015-06-25 NOTE — ED Notes (Signed)
[place] in w/ pt

## 2015-06-25 NOTE — BHH Counselor (Signed)
Contacted the EDP at [phone] to check the status of patient dialysis as the patient has not left for dialysis yet. Informed patients nurse.   Rosalin Hawking, LCSW Therapeutic Triage Specialist [place] 06/25/2015 7:50 PM

## 2015-06-25 NOTE — ED Notes (Signed)
Language line interpreter- # [phone] is aware that she will [facility] today [facility] and that the time is unknown.  Medications being given reviewed w/ pt.

## 2015-06-25 NOTE — ED Notes (Signed)
Pt. Noted in room. No complaints or concerns voiced. No distress or abnormal behavior noted. Will continue to monitor with security cameras. Q 15 minute rounds continue. 

## 2015-06-25 NOTE — ED Notes (Signed)
Language line utilized for assessment.

## 2015-06-25 NOTE — ED Notes (Signed)
Pt has been tentatively accepted to [facility] after her dialysis today.  After pt returns from dialysis call [facility] for bed.

## 2015-06-25 NOTE — ED Notes (Signed)
Dr Wyvonnia Dusky was notified that pt needs to be dialyzed today. Was advised by RN that pt should be d/c'd home.

## 2015-06-25 NOTE — ED Notes (Signed)
Dr. Dayna Barker back to see pt. and TTS. After consulting with nephrologist EDP states dialysis will most likely be in AM. Dr. Dayna Barker made aware of dose of amlodipine given earlier today.

## 2015-06-25 NOTE — ED Notes (Signed)
Report received from Janie Rambo RN. Pt. Sleeping, respirations regular and unlabored. Will continue to monitor for safety via security cameras and Q 15 minute checks. 

## 2015-06-25 NOTE — Progress Notes (Signed)
Disposition CSW contacted Neoma Laming CSW at [facility] for consideration of their Behavioral Health unit due to patient's need for Dialysis.    [place] Disposition CSW [phone]

## 2015-06-25 NOTE — ED Notes (Signed)
Dain RN charge nurse at hemodialysis contact and is aware that pt has received amylodipine earlier  today and that the patient will need a sitter.  They will call approx 1 hr before they are ready for the patient.

## 2015-06-25 NOTE — ED Notes (Signed)
Visitor in w/ pt
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
## 2015-06-25 NOTE — ED Provider Notes (Signed)
29 yo F here with SI, pending placement. Has missed dialysis sessions and has elevated BUN/Cr. Was initially thought to get dialysis today at 1100 but they didn't have openings on the schedule. Spoke with nephrologist on call and they are full schedule, unsure of ability to get her done tonight. Patient not needing emergent dialysis based on labs currently. Will order repeat BMP in the AM and if emergent need arises, will need to contact nephrologist again. otherwise will continue to monitor.   Merrily Pew, MD 06/25/15 2011

## 2015-06-26 LAB — BASIC METABOLIC PANEL
ANION GAP: 14 (ref 5–15)
BUN: 81 mg/dL — AB (ref 6–20)
CO2: 22 mmol/L (ref 22–32)
Calcium: 9.1 mg/dL (ref 8.9–10.3)
Chloride: 99 mmol/L — ABNORMAL LOW (ref 101–111)
Creatinine, Ser: 11.61 mg/dL — ABNORMAL HIGH (ref 0.44–1.00)
GFR, EST AFRICAN AMERICAN: 5 mL/min — AB (ref >60)
GFR, EST NON AFRICAN AMERICAN: 4 mL/min — AB (ref >60)
GLUCOSE: 94 mg/dL (ref 65–99)
Potassium: 4.6 mmol/L (ref 3.5–5.1)
SODIUM: 135 mmol/L (ref 135–145)

## 2015-06-26 LAB — HEPATITIS B SURFACE ANTIGEN: Hepatitis B Surface Ag: NEGATIVE

## 2015-06-26 MED ORDER — LIDOCAINE HCL (PF) 1 % IJ SOLN: 5.0000 mL | INTRAMUSCULAR | Status: DC | PRN

## 2015-06-26 MED ORDER — SODIUM CHLORIDE 0.9 % IV SOLN: 100.0000 mL | INTRAVENOUS | Status: DC | PRN

## 2015-06-26 MED ORDER — DOXERCALCIFEROL 4 MCG/2ML IV SOLN
INTRAVENOUS | Status: AC
  Administered 2015-06-26: 4 ug via INTRAVENOUS
  Filled 2015-06-26: qty 2

## 2015-06-26 MED ORDER — ALTEPLASE 2 MG IJ SOLR: 2.0000 mg | Freq: Once | INTRAMUSCULAR | Status: DC | PRN

## 2015-06-26 MED ORDER — PENTAFLUOROPROP-TETRAFLUOROETH EX AERO: 1.0000 "application " | INHALATION_SPRAY | CUTANEOUS | Status: DC | PRN

## 2015-06-26 MED ORDER — LIDOCAINE-PRILOCAINE 2.5-2.5 % EX CREA: 1.0000 "application " | TOPICAL_CREAM | CUTANEOUS | Status: DC | PRN

## 2015-06-26 MED ORDER — HEPARIN SODIUM (PORCINE) 1000 UNIT/ML DIALYSIS: 1000.0000 [IU] | INTRAMUSCULAR | Status: DC | PRN

## 2015-06-26 MED ORDER — HEPARIN SODIUM (PORCINE) 1000 UNIT/ML DIALYSIS: 20.0000 [IU]/kg | INTRAMUSCULAR | Status: DC | PRN

## 2015-06-26 NOTE — ED Notes (Signed)
Pt. Noted sleeping in room. No complaints or concerns voiced. No distress or abnormal behavior noted. Will continue to monitor with security cameras. Q 15 minute rounds continue. 

## 2015-06-26 NOTE — ED Notes (Signed)
Up to the bathroom 

## 2015-06-26 NOTE — Progress Notes (Signed)
2:50pm. CSW called [facility] to check on status of patient's dialysis today. Spoke with renal MD at [phone]. Pt is still on list to receive dialysis today, and pt can receive care when on-call RN arrives. Per MD, once RN arrives, s/he will call our nursing staff [phone]) to arrange transport.  Friend Worker [facility] phone: [phone]

## 2015-06-26 NOTE — ED Notes (Signed)
TTS into see 

## 2015-06-26 NOTE — ED Notes (Signed)
Dr Joelyn Oms contacted about having pt diaylzed emergently. No indications for emergent criteria, but she will be dialyzed today per dr Joelyn Oms.

## 2015-06-26 NOTE — ED Provider Notes (Signed)
Pt stable Awake/alert She just got back from [facility] for her dialysis Awaiting placement   Ripley Fraise, MD 06/26/15 2345

## 2015-06-26 NOTE — ED Notes (Signed)
Dr Eulis Foster aware of delay in dialysis

## 2015-06-26 NOTE — ED Notes (Signed)
Attempted to contact dialysis to verify that she can be transported

## 2015-06-26 NOTE — ED Notes (Signed)
Dr A requesting pt be dialyzed emergently, will contact dialysis unit.

## 2015-06-26 NOTE — ED Notes (Signed)
[facility] at [facility] contacted-contact  Lyndee Leo who will be performing dialysis [phone]

## 2015-06-26 NOTE — ED Notes (Signed)
[facility]- on call [phone]

## 2015-06-26 NOTE — ED Notes (Addendum)
Lyndee Leo RN contacted he aware that pt requires a sitter--OK to contact [facility] for transport

## 2015-06-26 NOTE — ED Notes (Signed)
Report to [facility]

## 2015-06-26 NOTE — ED Notes (Signed)
Language line interpreter [phone] is aware that she will transport to [facility] shortly for [facility].  NAD, no pain/discomfort. Pt voiced no questions.

## 2015-06-26 NOTE — Progress Notes (Signed)
8:07am. CSW called dialysis clinic at [facility] to check on status of pt receiving dialysis today. Spoke with Lyndee Leo. The clinic is quite busy this weekend, and they will be able to fit patient at 3pm. However, if pt needs treatment emergently, EDP can call Dianne, call RN, and pt will be worked in earlier.  CSW spoke with Dione Housekeeper and Ozzie Hoyle at [facility] yesterday. Pt is being considered for inpatient mental health treatment at [facility]. However, pt requires dialysis before being reviewed and transferred this weekend.  [place] Worker [facility] phone: [phone]

## 2015-06-26 NOTE — ED Notes (Signed)
[facility] transferred to [facility] for [facility]-transfer report, EMTELA, MAR,face sheet sheet with pt.  PT is aware that she will be returning after the procedure is complete.

## 2015-06-26 NOTE — ED Notes (Signed)
[place] RN-on called contacted-need to contact on call nephrologist to have pt done emergently.

## 2015-06-26 NOTE — ED Notes (Signed)
Message left for Lyndee Leo RN to call concerning when she can transport for dialysis [phone]

## 2015-06-26 NOTE — ED Notes (Signed)
Bed: [facility] Expected date:  Expected time:  Means of arrival:  Comments: Hold-pt to return [facility]

## 2015-06-26 NOTE — BH Assessment (Signed)
Patient was reassessed on 06/26/2015 using Spanish Interpreter via Telephone.  09:51 - Patient states that she is "fine" patient states that she is not currently feeling any pain. Patient denies SI/HI and AVH. Patient states that she would be interested in information for therapist who speaks spanish for follow up. Patient was informed that we are working towards getting her to dialysis today. Patient states that she does have emergency medicaid for her dialysis and she has the information at home to transition her medicaid to permanent medicaid. Informed Psychiatric team of resources.   Patient continues to meet inpatient criteria after dialysis. Dialysis is recommended to occur as soon as possible due to patient missing her scheduled time yesterday.  Patients nurse has been informed as well.  Inpatient has been recommended per Dr. Darleene Cleaver and Charmaine Downs, NP.    Provided patient information for spanish speaking LCSW via language line interprete.   Rosalin Hawking, LCSW Therapeutic Triage Specialist [place] 06/26/2015 12:04 PM'

## 2015-06-26 NOTE — Progress Notes (Signed)
4:45pm. CSW received phone call from [place], [place]. Reports that Dr. Jerilee Hoh at [facility] has declined pt due to lack of acuity.  [place] Worker [facility] phone: [phone]

## 2015-06-27 NOTE — Progress Notes (Signed)
CM spoke with pt who confirms uninsured [place] resident with no pcp.  CM discussed and provided written information for uninsured accepting pcps, discussed the importance of pcp vs EDP services for f/u care, www.needymeds.org, www.goodrx.com, discounted pharmacies and other [place] resources such as [facility] , P4CC, affordable care act, financial assistance, uninsured dental services, [place] med assist, DSS and  health department  Reviewed resources for [place] uninsured accepting pcps like [facility], family medicine at [address], [facility], [facility], local urgent care centers, [facility], [facility], general medical clinics, [facility], [facility] plus others, medication resources, [facility] out patient pharmacies and housing Pt voiced understanding and appreciation of resources provided   Provided P4CC contact information Pt agreed to a referral Cm completed referral Pt to be contact by P4CC clinical liason  

## 2015-06-27 NOTE — ED Notes (Signed)
Pt is alert and pleasant.  She understands very little [place].  15 minute checks in place.  Pt reassured of her safety.
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
## 2015-06-28 ENCOUNTER — Non-Acute Institutional Stay (HOSPITAL_BASED_OUTPATIENT_CLINIC_OR_DEPARTMENT_OTHER)
Admission: RE | Admit: 2015-06-28 | Discharge: 2015-06-28 | Disposition: A | Discharge status: Other Acute Inpatient Hospital | Payer: Medicaid Other | Source: Ambulatory Visit | Attending: Nephrology | Admitting: Nephrology

## 2015-06-28 DIAGNOSIS — F322 Major depressive disorder, single episode, severe without psychotic features: Secondary | ICD-10-CM | POA: Diagnosis not present

## 2015-06-28 LAB — CBC
HCT: 33.5 % — ABNORMAL LOW (ref 36.0–46.0)
HEMOGLOBIN: 11.2 g/dL — AB (ref 12.0–15.0)
MCH: 28.9 pg (ref 26.0–34.0)
MCHC: 33.4 g/dL (ref 30.0–36.0)
MCV: 86.6 fL (ref 78.0–100.0)
Platelets: 167 10*3/uL (ref 150–400)
RBC: 3.87 MIL/uL (ref 3.87–5.11)
RDW: 13.9 % (ref 11.5–15.5)
WBC: 6.7 10*3/uL (ref 4.0–10.5)

## 2015-06-28 LAB — RENAL FUNCTION PANEL
ALBUMIN: 3.6 g/dL (ref 3.5–5.0)
Anion gap: 12 (ref 5–15)
BUN: 64 mg/dL — AB (ref 6–20)
CALCIUM: 9.7 mg/dL (ref 8.9–10.3)
CO2: 27 mmol/L (ref 22–32)
CREATININE: 9.22 mg/dL — AB (ref 0.44–1.00)
Chloride: 90 mmol/L — ABNORMAL LOW (ref 101–111)
GFR calc Af Amer: 6 mL/min — ABNORMAL LOW (ref >60)
GFR calc non Af Amer: 5 mL/min — ABNORMAL LOW (ref >60)
GLUCOSE: 100 mg/dL — AB (ref 65–99)
PHOSPHORUS: 5.3 mg/dL — AB (ref 2.5–4.6)
Potassium: 4.7 mmol/L (ref 3.5–5.1)
SODIUM: 129 mmol/L — AB (ref 135–145)

## 2015-06-28 MED ORDER — DOXERCALCIFEROL 4 MCG/2ML IV SOLN
INTRAVENOUS | Status: AC
  Filled 2015-06-28: qty 2

## 2015-06-28 MED ORDER — HEPARIN SODIUM (PORCINE) 1000 UNIT/ML DIALYSIS: 1000.0000 [IU] | INTRAMUSCULAR | Status: DC | PRN

## 2015-06-28 MED ORDER — SODIUM CHLORIDE 0.9 % IV SOLN: 100.0000 mL | INTRAVENOUS | Status: DC | PRN

## 2015-06-28 MED ORDER — ALTEPLASE 2 MG IJ SOLR: 2.0000 mg | Freq: Once | INTRAMUSCULAR | Status: DC | PRN

## 2015-06-28 MED ORDER — LIDOCAINE-PRILOCAINE 2.5-2.5 % EX CREA: 1.0000 "application " | TOPICAL_CREAM | CUTANEOUS | Status: DC | PRN

## 2015-06-28 MED ORDER — HEPARIN SODIUM (PORCINE) 1000 UNIT/ML DIALYSIS: 20.0000 [IU]/kg | INTRAMUSCULAR | Status: DC | PRN

## 2015-06-28 MED ORDER — LIDOCAINE HCL (PF) 1 % IJ SOLN: 5.0000 mL | INTRAMUSCULAR | Status: DC | PRN

## 2015-06-28 MED ORDER — PENTAFLUOROPROP-TETRAFLUOROETH EX AERO: 1.0000 "application " | INHALATION_SPRAY | CUTANEOUS | Status: DC | PRN

## 2015-06-28 NOTE — BHH Counselor (Signed)
[facility] Assessment Progress Note  Counselor re-assessed pt this morning via [employer], as pt speaks [place]. Translator was Audelia Hives [id number]. Pt denied being depressed or sad as when she was first admitted. She denies SI, HI, AVH. She is agreeable to being d/c to a shelter. She requested help with getting clothes from her home. Pt advised that SW will f/u with her concerning this.   Per Dr. Darleene Cleaver and Reginold Agent, NP, pt is psychiatrically cleared and can be d/c today.   Kenna Gilbert. Lovena Le, [place], [place], [facility] Counselor
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
## 2015-06-28 NOTE — Progress Notes (Signed)
CSW spoke with [id number] social work to assist further with discharge needs.   Belia Heman, [place] Work  [employer] [phone]
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
## 2015-06-28 NOTE — ED Notes (Signed)
Pt is alert and oriented.  She is pleasant and cooperative.  Dialysis is scheduled and [facility] called for transport.
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
## 2015-07-01 LAB — CULTURE, BLOOD (ROUTINE X 2)
CULTURE: NO GROWTH
Culture: NO GROWTH

## 2015-07-05 ENCOUNTER — Encounter: Payer: Self-pay | Admitting: Vascular Surgery

## 2015-07-06 ENCOUNTER — Ambulatory Visit: Payer: Self-pay | Admitting: Vascular Surgery

## 2015-07-06 ENCOUNTER — Inpatient Hospital Stay (HOSPITAL_COMMUNITY): Admission: RE | Admit: 2015-07-06 | Payer: Self-pay | Source: Ambulatory Visit

## 2015-08-02 ENCOUNTER — Inpatient Hospital Stay (HOSPITAL_COMMUNITY): Payer: Self-pay

## 2015-08-02 ENCOUNTER — Emergency Department (HOSPITAL_COMMUNITY): Payer: Self-pay

## 2015-08-02 ENCOUNTER — Encounter (HOSPITAL_COMMUNITY): Payer: Self-pay | Admitting: Emergency Medicine

## 2015-08-02 ENCOUNTER — Inpatient Hospital Stay (HOSPITAL_COMMUNITY)
Admission: EM | Admit: 2015-08-02 | Discharge: 2015-08-07 | DRG: 871 | Disposition: A | Discharge status: Home / Self Care | Payer: Self-pay | Attending: Internal Medicine | Admitting: Internal Medicine

## 2015-08-02 DIAGNOSIS — R7881 Bacteremia: Secondary | ICD-10-CM | POA: Diagnosis present

## 2015-08-02 DIAGNOSIS — D696 Thrombocytopenia, unspecified: Secondary | ICD-10-CM

## 2015-08-02 DIAGNOSIS — N185 Chronic kidney disease, stage 5: Secondary | ICD-10-CM

## 2015-08-02 DIAGNOSIS — A4159 Other Gram-negative sepsis: Principal | ICD-10-CM | POA: Diagnosis present

## 2015-08-02 DIAGNOSIS — D649 Anemia, unspecified: Secondary | ICD-10-CM

## 2015-08-02 DIAGNOSIS — I120 Hypertensive chronic kidney disease with stage 5 chronic kidney disease or end stage renal disease: Secondary | ICD-10-CM | POA: Diagnosis present

## 2015-08-02 DIAGNOSIS — N059 Unspecified nephritic syndrome with unspecified morphologic changes: Secondary | ICD-10-CM | POA: Diagnosis present

## 2015-08-02 DIAGNOSIS — N2581 Secondary hyperparathyroidism of renal origin: Secondary | ICD-10-CM | POA: Diagnosis present

## 2015-08-02 DIAGNOSIS — E872 Acidosis, unspecified: Secondary | ICD-10-CM | POA: Diagnosis present

## 2015-08-02 DIAGNOSIS — D6959 Other secondary thrombocytopenia: Secondary | ICD-10-CM | POA: Diagnosis present

## 2015-08-02 DIAGNOSIS — R1084 Generalized abdominal pain: Secondary | ICD-10-CM | POA: Diagnosis present

## 2015-08-02 DIAGNOSIS — A419 Sepsis, unspecified organism: Secondary | ICD-10-CM

## 2015-08-02 DIAGNOSIS — R109 Unspecified abdominal pain: Secondary | ICD-10-CM

## 2015-08-02 DIAGNOSIS — Z992 Dependence on renal dialysis: Secondary | ICD-10-CM

## 2015-08-02 DIAGNOSIS — R000 Tachycardia, unspecified: Secondary | ICD-10-CM | POA: Diagnosis present

## 2015-08-02 DIAGNOSIS — N898 Other specified noninflammatory disorders of vagina: Secondary | ICD-10-CM

## 2015-08-02 DIAGNOSIS — N939 Abnormal uterine and vaginal bleeding, unspecified: Secondary | ICD-10-CM | POA: Diagnosis present

## 2015-08-02 DIAGNOSIS — N186 End stage renal disease: Secondary | ICD-10-CM | POA: Diagnosis present

## 2015-08-02 DIAGNOSIS — R509 Fever, unspecified: Secondary | ICD-10-CM | POA: Insufficient documentation

## 2015-08-02 DIAGNOSIS — D631 Anemia in chronic kidney disease: Secondary | ICD-10-CM | POA: Diagnosis present

## 2015-08-02 HISTORY — DX: Anemia, unspecified: D64.9

## 2015-08-02 LAB — URINALYSIS, ROUTINE W REFLEX MICROSCOPIC
BILIRUBIN URINE: NEGATIVE
Glucose, UA: 250 mg/dL — AB
KETONES UR: 15 mg/dL — AB
NITRITE: NEGATIVE
Protein, ur: >300 mg/dL — AB
Specific Gravity, Urine: 1.009 (ref 1.005–1.030)
UROBILINOGEN UA: 0.2 mg/dL (ref 0.0–1.0)
pH: 8.5 — ABNORMAL HIGH (ref 5.0–8.0)

## 2015-08-02 LAB — I-STAT CG4 LACTIC ACID, ED
LACTIC ACID, VENOUS: 2.9 mmol/L — AB (ref 0.5–2.0)
Lactic Acid, Venous: 0.49 mmol/L — ABNORMAL LOW (ref 0.5–2.0)

## 2015-08-02 LAB — CBC WITH DIFFERENTIAL/PLATELET
Basophils Absolute: 0 K/uL (ref 0.0–0.1)
Basophils Relative: 0 %
Eosinophils Absolute: 0 K/uL (ref 0.0–0.7)
Eosinophils Relative: 0 %
HCT: 21.5 % — ABNORMAL LOW (ref 36.0–46.0)
Hemoglobin: 7.5 g/dL — ABNORMAL LOW (ref 12.0–15.0)
Lymphocytes Relative: 5 %
Lymphs Abs: 0.5 K/uL — ABNORMAL LOW (ref 0.7–4.0)
MCH: 29.1 pg (ref 26.0–34.0)
MCHC: 34.9 g/dL (ref 30.0–36.0)
MCV: 83.3 fL (ref 78.0–100.0)
Monocytes Absolute: 0.3 K/uL (ref 0.1–1.0)
Monocytes Relative: 4 %
Neutro Abs: 8.2 K/uL — ABNORMAL HIGH (ref 1.7–7.7)
Neutrophils Relative %: 91 %
Platelets: 76 K/uL — ABNORMAL LOW (ref 150–400)
RBC: 2.58 MIL/uL — ABNORMAL LOW (ref 3.87–5.11)
RDW: 13.7 % (ref 11.5–15.5)
WBC: 9 K/uL (ref 4.0–10.5)

## 2015-08-02 LAB — COMPREHENSIVE METABOLIC PANEL
ALBUMIN: 3.4 g/dL — AB (ref 3.5–5.0)
ALT: 31 U/L (ref 14–54)
AST: 34 U/L (ref 15–41)
Alkaline Phosphatase: 72 U/L (ref 38–126)
Anion gap: 17 — ABNORMAL HIGH (ref 5–15)
BUN: 43 mg/dL — AB (ref 6–20)
CO2: 21 mmol/L — AB (ref 22–32)
Calcium: 10.6 mg/dL — ABNORMAL HIGH (ref 8.9–10.3)
Chloride: 92 mmol/L — ABNORMAL LOW (ref 101–111)
Creatinine, Ser: 6.47 mg/dL — ABNORMAL HIGH (ref 0.44–1.00)
GFR calc Af Amer: 9 mL/min — ABNORMAL LOW (ref >60)
GFR calc non Af Amer: 8 mL/min — ABNORMAL LOW (ref >60)
GLUCOSE: 101 mg/dL — AB (ref 65–99)
POTASSIUM: 4 mmol/L (ref 3.5–5.1)
SODIUM: 130 mmol/L — AB (ref 135–145)
TOTAL PROTEIN: 6.9 g/dL (ref 6.5–8.1)
Total Bilirubin: 0.9 mg/dL (ref 0.3–1.2)

## 2015-08-02 LAB — DIC (DISSEMINATED INTRAVASCULAR COAGULATION)PANEL
INR: 1.26 (ref 0.00–1.49)
Prothrombin Time: 15.9 seconds — ABNORMAL HIGH (ref 11.6–15.2)

## 2015-08-02 LAB — URINE MICROSCOPIC-ADD ON

## 2015-08-02 LAB — DIC (DISSEMINATED INTRAVASCULAR COAGULATION) PANEL
APTT: 29 s (ref 24–37)
D DIMER QUANT: 1.74 ug{FEU}/mL — AB (ref 0.00–0.48)
FIBRINOGEN: 453 mg/dL (ref 204–475)
PLATELETS: 72 10*3/uL — AB (ref 150–400)
SMEAR REVIEW: NONE SEEN

## 2015-08-02 LAB — PROCALCITONIN: Procalcitonin: 50.48 ng/mL

## 2015-08-02 LAB — I-STAT BETA HCG BLOOD, ED (MC, WL, AP ONLY)

## 2015-08-02 MED ORDER — VANCOMYCIN HCL 500 MG IV SOLR
250.0000 mg | Freq: Once | INTRAVENOUS | Status: AC
  Administered 2015-08-02: 250 mg via INTRAVENOUS
  Filled 2015-08-02: qty 250

## 2015-08-02 MED ORDER — VANCOMYCIN HCL IN DEXTROSE 1-5 GM/200ML-% IV SOLN: 1000.0000 mg | Freq: Once | INTRAVENOUS | Status: DC

## 2015-08-02 MED ORDER — ONDANSETRON HCL 4 MG PO TABS: 4.0000 mg | ORAL_TABLET | Freq: Four times a day (QID) | ORAL | Status: DC | PRN

## 2015-08-02 MED ORDER — PIPERACILLIN-TAZOBACTAM 3.375 G IVPB 30 MIN
3.3750 g | Freq: Three times a day (TID) | INTRAVENOUS | Status: DC
  Filled 2015-08-02: qty 50

## 2015-08-02 MED ORDER — SODIUM CHLORIDE 0.9 % IV BOLUS (SEPSIS)
30.0000 mL/kg | Freq: Once | INTRAVENOUS | Status: AC
  Administered 2015-08-02: 1143 mL via INTRAVENOUS

## 2015-08-02 MED ORDER — ACETAMINOPHEN 650 MG RE SUPP: 650.0000 mg | Freq: Four times a day (QID) | RECTAL | Status: DC | PRN

## 2015-08-02 MED ORDER — OXYCODONE HCL 5 MG PO TABS
5.0000 mg | ORAL_TABLET | ORAL | Status: DC | PRN
  Administered 2015-08-03 – 2015-08-05 (×3): 5 mg via ORAL
  Filled 2015-08-02 (×2): qty 1

## 2015-08-02 MED ORDER — MORPHINE SULFATE (PF) 2 MG/ML IV SOLN
2.0000 mg | INTRAVENOUS | Status: DC | PRN
  Administered 2015-08-02 – 2015-08-04 (×2): 2 mg via INTRAVENOUS
  Filled 2015-08-02 (×2): qty 1

## 2015-08-02 MED ORDER — SODIUM CHLORIDE 0.9 % IV SOLN
INTRAVENOUS | Status: DC
  Administered 2015-08-02: via INTRAVENOUS

## 2015-08-02 MED ORDER — MORPHINE SULFATE (PF) 2 MG/ML IV SOLN
2.0000 mg | INTRAVENOUS | Status: DC | PRN
  Administered 2015-08-02: 2 mg via INTRAVENOUS
  Filled 2015-08-02: qty 1

## 2015-08-02 MED ORDER — IOHEXOL 300 MG/ML  SOLN
25.0000 mL | INTRAMUSCULAR | Status: AC
  Administered 2015-08-02 (×2): 25 mL via ORAL

## 2015-08-02 MED ORDER — SODIUM CHLORIDE 0.9 % IV BOLUS (SEPSIS)
1000.0000 mL | Freq: Once | INTRAVENOUS | Status: AC
  Administered 2015-08-02: 1000 mL via INTRAVENOUS

## 2015-08-02 MED ORDER — ACETAMINOPHEN 325 MG PO TABS
650.0000 mg | ORAL_TABLET | Freq: Four times a day (QID) | ORAL | Status: DC | PRN
  Administered 2015-08-03: 650 mg via ORAL

## 2015-08-02 MED ORDER — ONDANSETRON HCL 4 MG/2ML IJ SOLN
4.0000 mg | Freq: Four times a day (QID) | INTRAMUSCULAR | Status: DC | PRN
  Administered 2015-08-02: 4 mg via INTRAVENOUS
  Filled 2015-08-02: qty 2

## 2015-08-02 MED ORDER — SODIUM CHLORIDE 0.9 % IJ SOLN
3.0000 mL | Freq: Two times a day (BID) | INTRAMUSCULAR | Status: DC
Start: 2015-08-02 — End: 2015-08-07
  Administered 2015-08-02 – 2015-08-06 (×7): 3 mL via INTRAVENOUS

## 2015-08-02 MED ORDER — PIPERACILLIN-TAZOBACTAM IN DEX 2-0.25 GM/50ML IV SOLN
2.2500 g | Freq: Three times a day (TID) | INTRAVENOUS | Status: DC
  Administered 2015-08-02 – 2015-08-03 (×2): 2.25 g via INTRAVENOUS
  Filled 2015-08-02 (×7): qty 50

## 2015-08-02 NOTE — Progress Notes (Signed)
Unit CM UR Completed by [facility] ED CM  W. Latrell Reitan RN  

## 2015-08-02 NOTE — ED Provider Notes (Signed)
CSN: [id number]     Arrival date & time 08/02/15  1435 History   First MD Initiated Contact with Patient 08/02/15 1438     Chief Complaint  Patient presents with  . Code Sepsis     (Consider location/radiation/quality/duration/timing/severity/associated sxs/prior Treatment) The history is provided by the patient, medical records and the EMS personnel. A language interpreter was used.     Karla Garner is a 29 y.o. female  with a hx of ESRD on dialysis Tuesday, Thursday, Saturday presents to the Emergency Department complaining of gradual, persistent, progressively worsening fever, chills, nausea, vomiting, rigors onset yesterday.  Patient presented to dialysis today and was found to be febrile to 103 orally. She was given 1 g of Tylenol and 500 of vancomycin through her Hickman catheter. She achieved 1.22 hours of dialysis while there. She is followed by Dr. Justin Mend of nephrology.  She denies sick contacts, recent travel, tick bites, rashes, abdominal pain. She does report bilateral flank and low back pain.  Patient reports that she makes urine. She is currently menstruating.  No known aggravating or relieving factors.    Past Medical History  Diagnosis Date  . Thrombocytopenia ([place]) 2014  . PONV (postoperative nausea and vomiting)     was on dialysis i during pregnancy2014  . Hypertension   . Hemodialysis patient ([place])     Tu, Th, Sat  . Glomerulonephritis   . CKD (chronic kidney disease), stage V [facility])    Past Surgical History  Procedure Laterality Date  . Cesarean section      2009  . Tubal ligation  2014  . Av fistula placement Left   . Ligation of arteriovenous  fistula Left 11/05/2014    Procedure: LIGATION OF LEFT ARM  BRACHIO-CEPHALIC ARTERIOVENOUS  FISTULA ,& REPAIR OF BRACHIAL ARTERY.;  Surgeon: Mal [name], MD;  Location: [place];  Service: Vascular;  Laterality: Left;  . Av fistula placement Right 04/14/2015    Procedure: CREATION OF RIGHT RADIOCEPHALIC  ARTERIOVENOUS (AV) FISTULA ;  Surgeon: Angelia Mould, MD;  Location: [place];  Service: Vascular;  Laterality: Right;  . Exchange of a dialysis catheter Right 04/14/2015    Procedure: EXCHANGE OF RIGHT INTERNAL JUGULAR DIALYSIS CATHETER;  Surgeon: Angelia Mould, MD;  Location: [place];  Service: Vascular;  Laterality: Right;   No family history on file. Social History  Substance Use Topics  . Smoking status: Never Smoker   . Smokeless tobacco: Never Used  . Alcohol Use: No   OB History    Gravida Para Term Preterm AB TAB SAB Ectopic Multiple Living   3 3 1 2      2      Review of Systems  Constitutional: Positive for fever and diaphoresis. Negative for appetite change, fatigue and unexpected weight change.  HENT: Negative for mouth sores.   Eyes: Negative for visual disturbance.  Respiratory: Negative for cough, chest tightness, shortness of breath and wheezing.   Cardiovascular: Positive for palpitations. Negative for chest pain.  Gastrointestinal: Positive for nausea and vomiting. Negative for abdominal pain, diarrhea and constipation.  Endocrine: Negative for polydipsia, polyphagia and polyuria.  Genitourinary: Negative for dysuria, urgency, frequency and hematuria.  Musculoskeletal: Positive for back pain. Negative for neck stiffness.  Skin: Negative for rash.  Allergic/Immunologic: Negative for immunocompromised state.  Neurological: Negative for syncope, light-headedness and headaches.  Hematological: Does not bruise/bleed easily.  Psychiatric/Behavioral: Negative for sleep disturbance. The patient is not nervous/anxious.       Allergies  Review of patient's allergies indicates no known allergies.  Home Medications   Prior to Admission medications   Not on File   BP 134/77 mmHg  Pulse 161  Temp(Src) 102.9 F (39.4 C)  Resp 12  Wt 84 lb (38.102 kg)  SpO2 100%  LMP 08/01/2015 Physical Exam  Constitutional: She is oriented to person, place, and time.  She appears well-developed and well-nourished. No distress.  Awake, alert, nontoxic appearance  HENT:  Head: Normocephalic and atraumatic.  Mouth/Throat: Oropharynx is clear and moist. No oropharyngeal exudate.  Eyes: Conjunctivae are normal. No scleral icterus.  Neck: Normal range of motion. Neck supple.  Cardiovascular: Regular rhythm, S1 normal, S2 normal and intact distal pulses.  Tachycardia present.   No murmur heard. Pulses:      Radial pulses are 2+ on the right side, and 2+ on the left side.       Dorsalis pedis pulses are 2+ on the right side, and 2+ on the left side.  Pulmonary/Chest: Effort normal and breath sounds normal. No respiratory distress. She has no wheezes.  Equal chest expansion  Abdominal: Soft. Bowel sounds are normal. She exhibits no mass. There is no tenderness. There is CVA tenderness ( Bilateral). There is no rebound and no guarding.  Abdomen is soft and nontender Bilateral CVA tenderness and low back pain  Musculoskeletal: Normal range of motion. She exhibits no edema.  Full range of motion of the T-spine and L-spine No midline tenderness Bilateral paraspinal tenderness of the lower T-spine and L-spine  Neurological: She is alert and oriented to person, place, and time.  Speech is clear and goal oriented Moves extremities without ataxia  Skin: Skin is warm and dry. She is not diaphoretic.  No rash No petechiae or purpura No splinter hemorrhages  Psychiatric: She has a normal mood and affect.  Nursing note and vitals reviewed.   ED Course  Procedures (including critical care time) Labs Review Labs Reviewed  COMPREHENSIVE METABOLIC PANEL - Abnormal; Notable for the following:    Sodium 130 (*)    Chloride 92 (*)    CO2 21 (*)    Glucose, Bld 101 (*)    BUN 43 (*)    Creatinine, Ser 6.47 (*)    Calcium 10.6 (*)    Albumin 3.4 (*)    GFR calc non Af Amer 8 (*)    GFR calc Af Amer 9 (*)    Anion gap 17 (*)    All other components within normal  limits  CBC WITH DIFFERENTIAL/PLATELET - Abnormal; Notable for the following:    RBC 2.58 (*)    Hemoglobin 7.5 (*)    HCT 21.5 (*)    Platelets 76 (*)    Neutro Abs 8.2 (*)    Lymphs Abs 0.5 (*)    All other components within normal limits  URINALYSIS, ROUTINE W REFLEX MICROSCOPIC (NOT AT [facility]) - Abnormal; Notable for the following:    APPearance HAZY (*)    pH 8.5 (*)    Glucose, UA 250 (*)    Hgb urine dipstick LARGE (*)    Ketones, ur 15 (*)    Protein, ur >300 (*)    Leukocytes, UA SMALL (*)    All other components within normal limits  URINE MICROSCOPIC-ADD ON - Abnormal; Notable for the following:    Squamous Epithelial / LPF MANY (*)    Bacteria, UA FEW (*)    All other components within normal limits  I-STAT CG4 LACTIC ACID,  ED - Abnormal; Notable for the following:    Lactic Acid, Venous 2.90 (*)    All other components within normal limits  CULTURE, BLOOD (ROUTINE X 2)  CULTURE, BLOOD (ROUTINE X 2)  URINE CULTURE  I-STAT BETA HCG BLOOD, ED ([facility], [facility], [facility] ONLY)    Imaging Review Dg Chest Port 1 View  08/02/2015   CLINICAL DATA:  Code sepsis.  ESRD.  EXAM: PORTABLE CHEST 1 VIEW  COMPARISON:  04/14/2015.  FINDINGS: Lungs are clear without infiltrate or effusion. Negative for pneumonia  Heart size mildly enlarged. Dialysis catheter tips in the SVC and right atrium unchanged. Vascularity normal.  IMPRESSION: No active disease.   Electronically Signed   By: Franchot Gallo M.D.   On: 08/02/2015 15:23   I have personally reviewed and evaluated these images and lab results as part of my medical decision-making.   EKG Interpretation   Date/Time:  Tuesday August 02 2015 14:57:09 EDT Ventricular Rate:  150 PR Interval:  115 QRS Duration: 73 QT Interval:  270 QTC Calculation: 426 R Axis:   85 Text Interpretation:  Sinus tachycardia Nonspecific repol abnormality,  diffuse leads tachycardia and nonspecific ST/T changes new compared to  Sept 2016 Confirmed by Regenia Skeeter  MD,  [place] ([id number]) on 08/02/2015 3:27:33 PM       CRITICAL CARE Performed by: Abigail Butts Total critical care time: 35min Critical care time was exclusive of separately billable procedures and treating other patients. Critical care was necessary to treat or prevent imminent or life-threatening deterioration. Critical care was time spent personally by me on the following activities: development of treatment plan with patient and/or surrogate as well as nursing, discussions with consultants, evaluation of patient's response to treatment, examination of patient, obtaining history from patient or surrogate, ordering and performing treatments and interventions, ordering and review of laboratory studies, ordering and review of radiographic studies, pulse oximetry and re-evaluation of patient's condition.   MDM   Final diagnoses:  Sepsis, due to unspecified organism ([place])  Anemia of chronic kidney failure, stage 5 ([place])  ESRD (end stage renal disease) ([place])   Karla Garner presents with sepsis of unknown etiology, but dialysis center is concerned about line sepsis.  Her Hickman catheter appears clean without tenderness to palpation.  Patient was CVA tenderness and low back pain differential also includes urosepsis.    Patient was given vancomycin 500 mg at dialysis. Will give an additional 250 mg to create a loading dose of 750 mg based on her weight at 20mg /kg.  Zosyn has also been ordered. Blood cultures obtained.  4:41 PM Pt with improving vitals, but remains tachycardic.  No hypotension.  Elevated lactic acid. No evidence of pneumonia, pneumothorax, UTI.  No leukocytosis and evidence of increased anemia to 7.5 today. Discussed with Dr [place] Pink who will admit to stepdown.      Karla Soho Windle Huebert, PA-C 08/02/15 1648  Sherwood Gambler, MD 08/09/15 1630

## 2015-08-02 NOTE — Progress Notes (Signed)
Patient's family member has interrupted for the admission assessment.

## 2015-08-02 NOTE — ED Notes (Signed)
Per EMS- pt here from dialysis on [facility]. Pt has her dialysis still accessed. Dialysis reports they think her graft site has an infection and that it might need to come out. Pt febrile at 103. Given 1g of tylenol and 500 of vancomycin at dialysis through access site. Pt denies nausea.

## 2015-08-02 NOTE — H&P (Signed)
[facility] History and Physical  Karla Garner AVW:[id number] DOB: 12-01-1985 DOA: 08/02/2015   PCP: None   Specialists: Followed by Dr. Edrick Oh  Chief Complaint: Fever, nausea, vomiting and abdominal pain  HPI: Karla Garner is a 29 y.o. female with a past medical history of glomerulonephritis with chronic kidney disease. She has been on and off of dialysis over the past 2 years. As of this June she had been off of dialysis for a year and a half. And then this June she was hospitalized and was started on hemodialysis. A right IJ tunneled dialysis catheter was placed. A right arm fistula was created. This was done on June 23. Patient speaks only [place]. Interpreter services were utilized to obtain history. Hence, there is some limitation in getting accurate history from this patient. It appears that she was in her usual state of health until about 3-4 days ago when she started feeling weak. She developed a fever on Sunday. She did not seek attention. She denies any cough. She started having nausea, vomiting since yesterday. However, she says that she vomited only once without any blood in it. Has had the diffuse abdominal pain. Has had some vaginal spotting but denies any vaginal discharge. She underwent tubal ligation 6 months ago and had had not had any menstrual periods since then until recently when she started developing vaginal spotting. Denies any joint pains. No skin rashes currently. No headaches currently. She was seen in her dialysis center today for her usual session and was found have a fever of 103F. She was dialyzed for 2 hours. She was given 500 milligrams of vancomycin and subsequently was transferred to the emergency department for further management.  Home Medications: Prior to Admission medications   Not on File    Allergies: No Known Allergies  Past Medical History: Past Medical History  Diagnosis Date  . Thrombocytopenia ([facility]) 2014  .  PONV (postoperative nausea and vomiting)     was on dialysis i during pregnancy2014  . Hypertension   . Hemodialysis patient ([facility])     Tu, Th, Sat  . Glomerulonephritis   . CKD (chronic kidney disease), stage V ([facility])     Past Surgical History  Procedure Laterality Date  . Cesarean section      20 09  . Tubal ligation  2014  . Av fistula placement Left   . Ligation of arteriovenous  fistula Left 11/05/2014    Procedure: LIGATION OF LEFT ARM  BRACHIO-CEPHALIC ARTERIOVENOUS  FISTULA ,& REPAIR OF BRACHIAL ARTERY.;  Surgeon: Mal [name], MD;  Location: [place];  Service: Vascular;  Laterality: Left;  . Av fistula placement Right 04/14/2015    Procedure: CREATION OF RIGHT RADIOCEPHALIC ARTERIOVENOUS (AV) FISTULA ;  Surgeon: Angelia Mould, MD;  Location: [place];  Service: Vascular;  Laterality: Right;  . Exchange of a dialysis catheter Right 04/14/2015    Procedure: EXCHANGE OF RIGHT INTERNAL JUGULAR DIALYSIS CATHETER;  Surgeon: Angelia Mould, MD;  Location: [facility] OR;  Service: Vascular;  Laterality: Right;    Social History: She lives in [place] with family.  Social History   Social History  . Marital Status: Single    Spouse Name: N/A  . Number of Children: N/A  . Years of Education: N/A   Occupational History  . Not on file.   Social History Main Topics  . Smoking status: Never Smoker   . Smokeless tobacco: Never Used  . Alcohol Use: No  . Drug Use: No  .  Sexual Activity: Not on file   Other Topics Concern  . Not on file   Social History Narrative      Family History:  denies any health problems in the family  Review of Systems - History obtained from the patient General ROS: negative Psychological ROS: negative Ophthalmic ROS: negative ENT ROS: negative Allergy and Immunology ROS: negative Hematological and Lymphatic ROS: negative Endocrine ROS: negative Respiratory ROS: as in hpi Cardiovascular ROS: as in hpi Gastrointestinal ROS: as in  hpi Genito-Urinary ROS: as in hpi Musculoskeletal ROS: negative Neurological ROS: no TIA or stroke symptoms Dermatological ROS: negative  Physical Examination  Filed Vitals:   08/02/15 1600 08/02/15 1615 08/02/15 1630 08/02/15 1645  BP: 134/67 131/70 127/75 121/74  Pulse:      Temp:      Resp: 16 12 12 17   Weight:      SpO2:        BP 121/74 mmHg  Pulse 161  Temp(Src) 102.9 F (39.4 C)  Resp 17  Wt 38.102 kg (84 lb)  SpO2 100%  LMP 08/01/2015  General appearance: alert, cooperative, appears stated age and no distress Head: Normocephalic, without obvious abnormality, atraumatic Eyes: conjunctivae/corneas clear. PERRL, EOM's intact.  Throat: dry mm Neck: no adenopathy, no carotid bruit, no JVD, supple, symmetrical, trachea midline and thyroid not enlarged, symmetric, no tenderness/mass/nodules Resp: clear to auscultation bilaterally Cardio: regular rate and rhythm, S1, S2 normal, no murmur, click, rub or gallop. No erythema around her IJ catheter. GI: soft, non-tender; bowel sounds normal; no masses,  no organomegaly Extremities: extremities normal, atraumatic, no cyanosis or edema. No erythema over the site of fistula in the right arm. Pulses: 2+ and symmetric Skin: Skin color, texture, turgor normal. No rashes or lesions Lymph nodes: Cervical, supraclavicular, and axillary nodes normal. Neurologic: Alert and oriented 3. No focal neurological deficits.  Laboratory Data: Results for orders placed or performed during the hospital encounter of 08/02/15 (from the past 48 hour(s))  Blood Culture (routine x 2)     Status: None (Preliminary result)   Collection Time: 08/02/15  2:51 PM  Result Value Ref Range   Specimen Description BLOOD LEFT ANTECUBITAL    Special Requests BOTTLES DRAWN AEROBIC AND ANAEROBIC 5CC    Culture PENDING    Report Status PENDING   Blood Culture (routine x 2)     Status: None (Preliminary result)   Collection Time: 08/02/15  2:52 PM  Result Value  Ref Range   Specimen Description BLOOD LEFT WRIST    Special Requests BOTTLES DRAWN AEROBIC AND ANAEROBIC 5CC    Culture PENDING    Report Status PENDING   Comprehensive metabolic panel     Status: Abnormal   Collection Time: 08/02/15  2:53 PM  Result Value Ref Range   Sodium 130 (L) 135 - 145 mmol/L   Potassium 4.0 3.5 - 5.1 mmol/L   Chloride 92 (L) 101 - 111 mmol/L   CO2 21 (L) 22 - 32 mmol/L   Glucose, Bld 101 (H) 65 - 99 mg/dL   BUN 43 (H) 6 - 20 mg/dL   Creatinine, Ser 6.47 (H) 0.44 - 1.00 mg/dL   Calcium 10.6 (H) 8.9 - 10.3 mg/dL   Total Protein 6.9 6.5 - 8.1 g/dL   Albumin 3.4 (L) 3.5 - 5.0 g/dL   AST 34 15 - 41 U/L   ALT 31 14 - 54 U/L   Alkaline Phosphatase 72 38 - 126 U/L   Total Bilirubin 0.9 0.3 - 1.2  mg/dL   GFR calc non Af Amer 8 (L) >60 mL/min   GFR calc Af Amer 9 (L) >60 mL/min    Comment: (NOTE) The eGFR has been calculated using the CKD EPI equation. This calculation has not been validated in all clinical situations. eGFR's persistently <60 mL/min signify possible Chronic Kidney Disease.    Anion gap 17 (H) 5 - 15  CBC WITH DIFFERENTIAL     Status: Abnormal   Collection Time: 08/02/15  2:53 PM  Result Value Ref Range   WBC 9.0 4.0 - 10.5 K/uL   RBC 2.58 (L) 3.87 - 5.11 MIL/uL   Hemoglobin 7.5 (L) 12.0 - 15.0 g/dL   HCT 21.5 (L) 36.0 - 46.0 %   MCV 83.3 78.0 - 100.0 fL   MCH 29.1 26.0 - 34.0 pg   MCHC 34.9 30.0 - 36.0 g/dL   RDW 13.7 11.5 - 15.5 %   Platelets 76 (L) 150 - 400 K/uL    Comment: PLATELET COUNT CONFIRMED BY SMEAR   Neutrophils Relative % 91 %   Neutro Abs 8.2 (H) 1.7 - 7.7 K/uL   Lymphocytes Relative 5 %   Lymphs Abs 0.5 (L) 0.7 - 4.0 K/uL   Monocytes Relative 4 %   Monocytes Absolute 0.3 0.1 - 1.0 K/uL   Eosinophils Relative 0 %   Eosinophils Absolute 0.0 0.0 - 0.7 K/uL   Basophils Relative 0 %   Basophils Absolute 0.0 0.0 - 0.1 K/uL  Urinalysis, Routine w reflex microscopic (not at [facility])     Status: Abnormal   Collection Time:  08/02/15  3:30 PM  Result Value Ref Range   Color, Urine YELLOW YELLOW   APPearance HAZY (A) CLEAR   Specific Gravity, Urine 1.009 1.005 - 1.030   pH 8.5 (H) 5.0 - 8.0   Glucose, UA 250 (A) NEGATIVE mg/dL   Hgb urine dipstick LARGE (A) NEGATIVE   Bilirubin Urine NEGATIVE NEGATIVE   Ketones, ur 15 (A) NEGATIVE mg/dL   Protein, ur >300 (A) NEGATIVE mg/dL   Urobilinogen, UA 0.2 0.0 - 1.0 mg/dL   Nitrite NEGATIVE NEGATIVE   Leukocytes, UA SMALL (A) NEGATIVE  I-Stat Beta hCG blood, ED ([facility], [facility], [facility] only)     Status: None   Collection Time: 08/02/15  3:30 PM  Result Value Ref Range   I-stat hCG, quantitative <5.0 <5 mIU/mL   Comment 3            Comment:   GEST. AGE      CONC.  (mIU/mL)   <=1 WEEK        5 - 50     2 WEEKS       50 - 500     3 WEEKS       100 - 10,000     4 WEEKS     1,000 - 30,000        FEMALE AND NON-PREGNANT FEMALE:     LESS THAN 5 mIU/mL   Urine microscopic-add on     Status: Abnormal   Collection Time: 08/02/15  3:30 PM  Result Value Ref Range   Squamous Epithelial / LPF MANY (A) RARE   WBC, UA 7-10 <3 WBC/hpf   RBC / HPF 11-20 <3 RBC/hpf   Bacteria, UA FEW (A) RARE  I-Stat CG4 Lactic Acid, ED  (not at  [facility])     Status: Abnormal   Collection Time: 08/02/15  3:32 PM  Result Value Ref Range   Lactic Acid, Venous  2.90 (HH) 0.5 - 2.0 mmol/L   Comment NOTIFIED PHYSICIAN     Radiology Reports: Dg Chest Port 1 View  08/02/2015   CLINICAL DATA:  Code sepsis.  ESRD.  EXAM: PORTABLE CHEST 1 VIEW  COMPARISON:  04/14/2015.  FINDINGS: Lungs are clear without infiltrate or effusion. Negative for pneumonia  Heart size mildly enlarged. Dialysis catheter tips in the SVC and right atrium unchanged. Vascularity normal.  IMPRESSION: No active disease.   Electronically Signed   By: Franchot Gallo M.D.   On: 08/02/2015 15:23    My interpretation of Electrocardiogram: Sinus tachycardia with heart rate in the 150s. Normal axis. Intervals are normal. No ST or T-wave changes. No Q  waves.  Problem List  Principal Problem:   Sepsis ([place]) Active Problems:   Glomerulonephritis   ESRD (end stage renal disease) ([place])   Vaginal spotting   Abdominal pain   Thrombocytopenia ([facility])   Normocytic anemia   Assessment: This is a 29 year old female of Hispanic origin who has glomerulonephritis and resultant chronic kidney disease and now on dialysis presents with a high fever with a picture of sepsis of unknown origin. Differential diagnoses include central line infection, intra-abdominal infection, UTI. She also has nausea, vomiting, abdominal pain and vaginal spotting, etiology of which is unclear.  Plan: #1 Sepsis of unknown origin: Sepsis protocol will be initiated. She'll be placed on vancomycin and Zosyn. Pro-calcitonin and ABG will be checked. All upper lactic acid. Follow-up on blood cultures. If blood cultures are positive her IJ catheter will have to be pulled out. Since her platelet counts are low DIC panel will be checked. She has been given IV fluids with normal saline. Fluids will be continued for now. Blood pressure is stable. She'll be monitored in the stepdown unit.  #2 History of glomerulonephritis  with end-stage renal disease: She is on dialysis on Tuesdays, Thursdays and Saturdays. She was dialyzed for 2 hours today. Discussed with Dr. Lorrene Reid. She will be available tonight if needed, otherwise. Nephrology will consult tomorrow morning.  #3 Thrombocytopenia: She has had low platelet counts before but this is the lowest it has been. Check DIC panel. Monitor counts closely. No excessive bleeding identified. Some vaginal spotting as mentioned earlier.  #4 Abdominal pain with nausea and vomiting and vaginal spotting: Abdomen is diffusely tender without any rebound, rigidity or guarding. Proceed with CT scan of the abdomen and pelvis. Beta hCG was less than 5.  #5 Normocytic anemia: Hemoglobin much lower than what it was last month. She denies excessive vaginal  bleeding. Monitor hemoglobins closely. Denies any blood in the stool or emesis. Previously she has been as low as 8. So the value of 11 from September is questionable. Monitor closely.   DVT Prophylaxis: SCDs Code Status: Full code Family Communication: Discussed with the patient using interpreter  Disposition Plan: Admit to stepdown   Further management decisions will depend on results of further testing and patient's response to treatment.   [facility]  [facility] Pager [phone]  If 7PM-7AM, please contact night-coverage [email or web address] Password [email or web address]  08/02/2015, 5:22 PM

## 2015-08-02 NOTE — Progress Notes (Addendum)
ANTIBIOTIC CONSULT NOTE - INITIAL  Pharmacy Consult for Zosyn and Vancomycin Indication: rule out sepsis/possible infection of graft  No Known Allergies  Patient Measurements: Weight: 84 lb (38.102 kg)  Vital Signs: Temp: 102.9 F (39.4 C) (10/11 1439) BP: 128/67 mmHg (10/11 1439) Pulse Rate: 161 (10/11 1439)  Labs: Estimated CrCl: < 10 ml/min (ESRD)   Microbiology: Px  Abx: Zosyn: 10/11 << Vancomycin: 10/11 <<  Assessment: 29yoF admitted with sepsis/possible graft infection in setting of ESRD  Goal of Therapy:  Pre -HD level of 15 - 25  Plan:  - Zosyn 2.25 gm Q8H -received 500 mg vancomycin in dialysis prior to admission; will give another 250 mg x 1 now to complete loading dose and then schedule subsequent doses accordingly.  Vincenza Hews, PharmD, BCPS 08/02/2015, 3:08 PM Pager: [phone]

## 2015-08-02 NOTE — Progress Notes (Addendum)
Per patient and family the doctor has recommended that no sticks be placed in the right arm.  The patient has "bad veins and the have difficulty" when it has been used.  Will inform doctor for PICC or CVC line order.

## 2015-08-02 NOTE — ED Notes (Signed)
Phlebotomy at bedside.

## 2015-08-03 DIAGNOSIS — N059 Unspecified nephritic syndrome with unspecified morphologic changes: Secondary | ICD-10-CM

## 2015-08-03 LAB — CBC
HCT: 17.8 % — ABNORMAL LOW (ref 36.0–46.0)
Hemoglobin: 5.9 g/dL — CL (ref 12.0–15.0)
MCH: 28.5 pg (ref 26.0–34.0)
MCHC: 33.1 g/dL (ref 30.0–36.0)
MCV: 86 fL (ref 78.0–100.0)
PLATELETS: 54 10*3/uL — AB (ref 150–400)
RBC: 2.07 MIL/uL — AB (ref 3.87–5.11)
RDW: 14 % (ref 11.5–15.5)
WBC: 7.3 10*3/uL (ref 4.0–10.5)

## 2015-08-03 LAB — APTT: aPTT: 30 seconds (ref 24–37)

## 2015-08-03 LAB — URINE CULTURE

## 2015-08-03 LAB — COMPREHENSIVE METABOLIC PANEL
ALK PHOS: 48 U/L (ref 38–126)
ALT: 26 U/L (ref 14–54)
AST: 23 U/L (ref 15–41)
Albumin: 2.5 g/dL — ABNORMAL LOW (ref 3.5–5.0)
Anion gap: 15 (ref 5–15)
BUN: 53 mg/dL — AB (ref 6–20)
CHLORIDE: 97 mmol/L — AB (ref 101–111)
CO2: 22 mmol/L (ref 22–32)
CREATININE: 7.57 mg/dL — AB (ref 0.44–1.00)
Calcium: 8.6 mg/dL — ABNORMAL LOW (ref 8.9–10.3)
GFR calc Af Amer: 8 mL/min — ABNORMAL LOW (ref >60)
GFR calc non Af Amer: 7 mL/min — ABNORMAL LOW (ref >60)
GLUCOSE: 102 mg/dL — AB (ref 65–99)
Potassium: 5.1 mmol/L (ref 3.5–5.1)
SODIUM: 134 mmol/L — AB (ref 135–145)
Total Bilirubin: 0.7 mg/dL (ref 0.3–1.2)
Total Protein: 5.8 g/dL — ABNORMAL LOW (ref 6.5–8.1)

## 2015-08-03 LAB — LACTIC ACID, PLASMA
LACTIC ACID, VENOUS: 0.6 mmol/L (ref 0.5–2.0)
Lactic Acid, Venous: 0.9 mmol/L (ref 0.5–2.0)

## 2015-08-03 LAB — HEMOGLOBIN AND HEMATOCRIT, BLOOD
HCT: 27.6 % — ABNORMAL LOW (ref 36.0–46.0)
Hemoglobin: 9.5 g/dL — ABNORMAL LOW (ref 12.0–15.0)

## 2015-08-03 LAB — PROTIME-INR
INR: 1.23 (ref 0.00–1.49)
Prothrombin Time: 15.7 seconds — ABNORMAL HIGH (ref 11.6–15.2)

## 2015-08-03 LAB — PREPARE RBC (CROSSMATCH)

## 2015-08-03 LAB — MRSA PCR SCREENING: MRSA by PCR: NEGATIVE

## 2015-08-03 MED ORDER — VANCOMYCIN HCL 500 MG IV SOLR
250.0000 mg | INTRAVENOUS | Status: AC
  Administered 2015-08-03: 250 mg via INTRAVENOUS
  Filled 2015-08-03 (×2): qty 250

## 2015-08-03 MED ORDER — LIDOCAINE-PRILOCAINE 2.5-2.5 % EX CREA
1.0000 "application " | TOPICAL_CREAM | CUTANEOUS | Status: DC | PRN
  Filled 2015-08-03: qty 5

## 2015-08-03 MED ORDER — PIPERACILLIN-TAZOBACTAM IN DEX 2-0.25 GM/50ML IV SOLN
2.2500 g | Freq: Three times a day (TID) | INTRAVENOUS | Status: DC
  Administered 2015-08-03 – 2015-08-06 (×8): 2.25 g via INTRAVENOUS
  Filled 2015-08-03 (×10): qty 50

## 2015-08-03 MED ORDER — RENA-VITE PO TABS
1.0000 | ORAL_TABLET | Freq: Every day | ORAL | Status: DC
  Administered 2015-08-03 – 2015-08-06 (×4): 1 via ORAL
  Filled 2015-08-03 (×4): qty 1

## 2015-08-03 MED ORDER — DARBEPOETIN ALFA 200 MCG/0.4ML IJ SOSY
200.0000 ug | PREFILLED_SYRINGE | INTRAMUSCULAR | Status: AC
  Administered 2015-08-03: 200 ug via INTRAVENOUS
  Filled 2015-08-03: qty 0.4

## 2015-08-03 MED ORDER — METOPROLOL TARTRATE 1 MG/ML IV SOLN
INTRAVENOUS | Status: AC
  Filled 2015-08-03: qty 5

## 2015-08-03 MED ORDER — SODIUM CHLORIDE 0.9 % IV SOLN
Freq: Once | INTRAVENOUS | Status: AC
  Administered 2015-08-03: 09:00:00 via INTRAVENOUS

## 2015-08-03 MED ORDER — ACETAMINOPHEN 325 MG PO TABS
ORAL_TABLET | ORAL | Status: AC
  Filled 2015-08-03: qty 2

## 2015-08-03 MED ORDER — METOPROLOL TARTRATE 1 MG/ML IV SOLN
5.0000 mg | Freq: Once | INTRAVENOUS | Status: AC
  Administered 2015-08-03: 5 mg via INTRAVENOUS

## 2015-08-03 MED ORDER — IBUPROFEN 400 MG PO TABS
400.0000 mg | ORAL_TABLET | Freq: Four times a day (QID) | ORAL | Status: DC | PRN
  Administered 2015-08-05: 400 mg via ORAL
  Filled 2015-08-03: qty 1
  Filled 2015-08-03: qty 2
  Filled 2015-08-03: qty 1

## 2015-08-03 MED ORDER — LIDOCAINE HCL (PF) 2 % IJ SOLN
0.0000 mL | Freq: Once | INTRAMUSCULAR | Status: DC | PRN
  Filled 2015-08-03: qty 20

## 2015-08-03 MED ORDER — HEPARIN SODIUM (PORCINE) 1000 UNIT/ML DIALYSIS: 1000.0000 [IU] | INTRAMUSCULAR | Status: DC | PRN

## 2015-08-03 MED ORDER — LIDOCAINE HCL (PF) 1 % IJ SOLN: 5.0000 mL | INTRAMUSCULAR | Status: DC | PRN

## 2015-08-03 MED ORDER — ALTEPLASE 2 MG IJ SOLR
2.0000 mg | Freq: Once | INTRAMUSCULAR | Status: DC | PRN
  Filled 2015-08-03: qty 2

## 2015-08-03 MED ORDER — SODIUM CHLORIDE 0.9 % IV SOLN: 100.0000 mL | INTRAVENOUS | Status: DC | PRN

## 2015-08-03 MED ORDER — SEVELAMER CARBONATE 800 MG PO TABS
800.0000 mg | ORAL_TABLET | Freq: Three times a day (TID) | ORAL | Status: DC
  Administered 2015-08-03 – 2015-08-07 (×10): 800 mg via ORAL
  Filled 2015-08-03 (×10): qty 1

## 2015-08-03 MED ORDER — DOXERCALCIFEROL 4 MCG/2ML IV SOLN
4.0000 ug | INTRAVENOUS | Status: DC
  Administered 2015-08-06: 4 ug via INTRAVENOUS
  Filled 2015-08-03 (×2): qty 2

## 2015-08-03 MED ORDER — DARBEPOETIN ALFA 200 MCG/0.4ML IJ SOSY
PREFILLED_SYRINGE | INTRAMUSCULAR | Status: AC
  Filled 2015-08-03: qty 0.4

## 2015-08-03 MED ORDER — PENTAFLUOROPROP-TETRAFLUOROETH EX AERO: 1.0000 "application " | INHALATION_SPRAY | CUTANEOUS | Status: DC | PRN

## 2015-08-03 MED ORDER — OXYCODONE HCL 5 MG PO TABS
ORAL_TABLET | ORAL | Status: AC
  Filled 2015-08-03: qty 1

## 2015-08-03 NOTE — Progress Notes (Signed)
Patient experienced hypertension along with tachycardia while on HD. Patient also exhibited a temperature of 105.4. Heart rate at 160 bpm without chest pain or shortness of breath. Patient is spanish speaking so the language line was in use. Andreas Blower NP was present and ordered Metoprolol 5 mg x 2. Patient also complained of pain along her back and feet. Tylenol 650 mg and oxycodone 5 mg given for pain and elevated temperature. Rapid Response called for support due to language barrier, to enhance clarity  for patient. Upon discharge from HD, HR is now 130 without symptoms. Patient returned to [place]. Dr Thereasa Solo has been informed of today's events on HD.

## 2015-08-03 NOTE — Progress Notes (Signed)
Spoke with Dr. Marylou Flesher concerning PICC order. CVC if needed, would need to be placed by IR.

## 2015-08-03 NOTE — Significant Event (Signed)
Rapid Response Event Note  Overview: Time Called: 1600 Arrival Time: 1605 Event Type: Other (Comment) (svt, fever, chills)  Initial Focused Assessment: During HD treatment patient began to complain of fever and chills.  SVT 170.  HD treatment stopped 1 hr early per staff. Temp max 104.5 BP 194/119 SVT 170 RR 22  O2 sat 100% on RA 5mg  lopressor, oxycodone given PO Tylenol given PO Patient shivering, Lung sounds decreased bases, regular heart tones  Interventions: 12 lead EKG done BP 177/109  ST 130s O2 sats 92% on RA placed on 2L [place],  O2 sats then 88%. Increased O2 to 4L [place]  O2 sat 96% 2nd dose of 5mg  Lopressor given 144/93 ST 120 for a few min then ST 130s  RR 16 Temp now 105.4 Scheduled vanc given Patient more comfortable. Transported back to 2c.  Patient without complaint, she seems to feel well.  Temp still elevated and HR 130s Dr Thereasa Solo at bedside to assess patient.   Event Summary: Name of Physician Notified: Dr Thereasa Solo at 1610  Name of Consulting Physician Notified: Nephrology PA at bedside at          [facility]

## 2015-08-03 NOTE — Progress Notes (Signed)
CRITICAL VALUE ALERT  Critical value received:  Positive blood cultures, anaerobic gram neg rods  Date of notification:  08/03/2015  Time of notification:  2309  Critical value read back: yes  Nurse who received alert:  Remo Lipps, RN  MD notified (1st page):  [place]  Time of first page:  [phone]  Responding MD: NP aware

## 2015-08-03 NOTE — Progress Notes (Signed)
CRITICAL VALUE ALERT  Critical value received:  Hgb 5.9  Date of notification:  08/03/15  Time of notification:  415  Critical value read back: yes  Nurse who received alert:  Candiss Norse RN  MD notified (1st page):  Lamar Blinks NP  Time of first page:  [phone]  MD notified (2nd page):  Time of second page:  Responding MD:  Lamar Blinks NP  Time MD responded:  [id number] Blood orders placed

## 2015-08-03 NOTE — Progress Notes (Signed)
[place] TEAM 1 - Stepdown/ICU TEAM PROGRESS NOTE  Karla Garner [id number] DOB: 11-28-1985 DOA: 08/02/2015 PCP: No primary care provider on file.  Admit HPI / Brief Narrative: 29 y.o. female with a history of glomerulonephritis with chronic kidney disease. She has been on and off of dialysis over the past 2 years. As of this June she had been off of dialysis for a year and a half. This June she was hospitalized and was started on hemodialysis again. A right IJ tunneled dialysis catheter was placed. A right arm fistula was created. This was done on June 23. She was in her usual state of health until about 3-4 days prior to admission when she started feeling weak. She developed a fever. She started having nausea, and vomiting with diffuse abdominal pain. She was seen in her dialysis center for her usual session and was found have a fever of 103F. She was dialyzed for 2 hours. She was given 500 milligrams of vancomycin and subsequently was transferred to the emergency department for further management.  HPI/Subjective: The patient does not speak [place].  I interviewed her with assistance from the interpreter telephone line.  She complains of some pain at the site of her dialysis graft but no chest pain shortness of breath nausea vomiting or abdominal pain.  She appears to be in no obvious distress whatsoever despite the fact that her most recent temperature was 105 and she is currently tachycardic at 130 bpm.  Assessment/Plan:  Gram negative rod bacteremia with Severe Sepsis / lactic acidosis  suspicious for line infection from tunneled HD cath - cath to be removed in a.m. 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  Nephrology following  Thrombocytopenia Common in gram- rod sepsis - no evidence of active bleeding presently  Abdom pain w/ nausea and vomiting  CT abdom w/o acute findings - appears to have ceased  Normocytic anemia  Due to ESRD +/- component of hemolysis in setting of gram neg rod bacteremia + menstrual loss - follow Hgb - s/p 2U PRBC thus far   Code Status: FULL Family Communication: no family present at time of exam Disposition Plan: SDU   Consultants: Nephrology   Procedures: none  Antibiotics: Zosyn 10/11 > Vanc 10/11 >  DVT prophylaxis: SCDs  Objective: Blood pressure 194/119, pulse 154, temperature 99.5 F (37.5 C), temperature source Oral, resp. rate 15, height 5\' 3"  (1.6 m), weight 43.7 kg (96 lb 5.5 oz), last menstrual period 08/01/2015, SpO2 100 %, unknown if currently breastfeeding.  Intake/Output Summary (Last 24 hours) at 08/03/15 1613 Last data filed at 08/03/15 0900  Gross per 24 hour  Intake 4239.25 ml  Output     50 ml  Net 4189.25 ml   Exam: General: No acute respiratory distress - alert and pleasant  Lungs: Clear to auscultation bilaterally without wheezes or crackles Cardiovascular: Tachycardic at 130 bpm with regular rhythm without murmur gallop or rub Abdomen: Nontender, nondistended, soft, bowel sounds positive, no rebound, no ascites, no appreciable mass  Extremities: No significant cyanosis, clubbing, or edema bilateral lower extremities  Data Reviewed: Basic Metabolic Panel:  Recent Labs Lab 08/02/15 1453 08/03/15 0220  NA 130* 134*  K 4.0 5.1  CL 92* 97*  CO2 21* 22  GLUCOSE 101* 102*  BUN 43* 53*  CREATININE 6.47* 7.57*  CALCIUM 10.6* 8.6*    CBC:  Recent Labs Lab  08/02/15 1453 08/02/15 1917 08/03/15 0220  WBC 9.0  --  7.3  NEUTROABS 8.2*  --   --   HGB 7.5*  --  5.9*  HCT 21.5*  --  17.8*  MCV 83.3  --  86.0  PLT 76* 72* 54*    Liver Function Tests:  Recent Labs Lab 08/02/15 1453 08/03/15 0220  AST 34 23  ALT 31 26  ALKPHOS 72 48  BILITOT 0.9 0.7  PROT 6.9 5.8*  ALBUMIN 3.4* 2.5*    Coags:  Recent Labs Lab 08/02/15 1917 08/03/15 0220  INR 1.26 1.23    Recent Labs Lab 08/02/15 1917 08/03/15 0220  APTT 29 30    Recent Results (from the past 240 hour(s))  Blood Culture (routine x 2)     Status: None (Preliminary result)   Collection Time: 08/02/15  2:51 PM  Result Value Ref Range Status   Specimen Description BLOOD LEFT ANTECUBITAL  Final   Special Requests BOTTLES DRAWN AEROBIC AND ANAEROBIC 5CC  Final   Culture  Setup Time   Final    GRAM NEGATIVE RODS IN BOTH AEROBIC AND ANAEROBIC BOTTLES CRITICAL RESULT CALLED TO, READ BACK BY AND VERIFIED WITH: T NEILSON @0605  08/03/15 MKELLY    Culture NO GROWTH < 24 HOURS  Final   Report Status PENDING  Incomplete  Blood Culture (routine x 2)     Status: None (Preliminary result)   Collection Time: 08/02/15  2:52 PM  Result Value Ref Range Status   Specimen Description BLOOD LEFT WRIST  Final   Special Requests BOTTLES DRAWN AEROBIC AND ANAEROBIC 5CC  Final   Culture NO GROWTH < 24 HOURS  Final   Report Status PENDING  Incomplete  Urine culture     Status: None (Preliminary result)   Collection Time: 08/02/15  3:30 PM  Result Value Ref Range Status   Specimen Description URINE, RANDOM  Final   Special Requests NONE  Final   Culture TOO YOUNG TO READ  Final   Report Status PENDING  Incomplete  MRSA PCR Screening     Status: None   Collection Time: 08/03/15 12:38 AM  Result Value Ref Range Status   MRSA by PCR NEGATIVE NEGATIVE Final    Comment:        The GeneXpert MRSA Assay (FDA approved for NASAL specimens only), is one component of a comprehensive MRSA  colonization surveillance program. It is not intended to diagnose MRSA infection nor to guide or monitor treatment for MRSA infections.      Studies:   Recent x-ray studies have been reviewed in detail by the Attending Physician  Scheduled Meds:  Scheduled Meds: . darbepoetin (ARANESP) injection - DIALYSIS  200 mcg Intravenous Q Wed-HD  . [START ON 08/04/2015] doxercalciferol  4 mcg Intravenous Q T,Th,Sa-HD  . metoprolol      . multivitamin  1 tablet Oral QHS  . piperacillin-tazobactam (ZOSYN)  IV  2.25 g Intravenous 3 times per day  . sevelamer carbonate  800 mg Oral TID WC  . sodium chloride  3 mL Intravenous Q12H  . vancomycin  250 mg  Intravenous Q Wed-HD    Time spent on care of this patient: 35 mins   MCCLUNG,JEFFREY T , MD   [facility] Office  [phone] Pager - Text Page per Shea Evans as per below:  On-Call/Text Page:      [email or web address]      password [email or web address]  If 7PM-7AM, please contact night-coverage [email or web address] Password [email or web address] 08/03/2015, 4:13 PM   LOS: 1 day

## 2015-08-03 NOTE — Progress Notes (Signed)
Blood Consent signed by the patient.  Information about blood transfusing and consent given in Spanish and Husband called to verify that she understood the information.  Witnessed by a second Therapist, sports.

## 2015-08-03 NOTE — Progress Notes (Signed)
Pt is still in dialysis.  Emergency case in the OR is about to start and we will be unable to remove catheter today.  We will remove it first thing in the morning.  Please have all supplies that were requested to the room and consent signed.  Karla Garner 08/03/2015. 4:26 PM

## 2015-08-03 NOTE — Progress Notes (Signed)
CRITICAL VALUE ALERT  Critical value received:  Positive Blood Cultures Anaerobic Gram + Rods  Date of notification:  08/03/15   Time of notification:  0555  Critical value read back: yes  Nurse who received alert:  Candiss Norse RN  MD notified (1st page):  Lamar Blinks NP  Time of first page:  [phone]  MD notified (2nd page):  Time of second page:  Responding MD:  Lamar Blinks NP  Time MD respondedGI:[id number]  - Already on Zosyn 2.25 g per NP

## 2015-08-03 NOTE — Procedures (Signed)
I was present at this dialysis session. I have reviewed the session itself and made appropriate changes.   One needle in AVF Qb 250 doing well.  Plan to remove [facility] today post HD and proceed with cannulation protocol without catheter.    Pearson Grippe  MD 08/03/2015, 2:23 PM

## 2015-08-03 NOTE — Consult Note (Signed)
Indication for Consultation:  Management of ESRD/hemodialysis; anemia, hypertension/volume and secondary hyperparathyroidism  HPI: Karla Garner is a 29 y.o. female who presented to the ED yesterday with fever and chills from her outpt HD center. She receives HD TTS @ [place], she has been back on HD since June after being off dialysis for about a year and a half. She reports feeling weak for a few days.  Yesterday she spiked a temp in HD, blood cultures were drawn and she was given 500 mgs vanc then sent to the ED. She is currently dialyzing through a catheter but does have an AVF which was placed in June- per outpt staff she is refusing cannulation. She also reports having her period, per notes she had a tubal ligation 6 months ago and periods since then. Will arrange for HD while admitted.   Past Medical History  Diagnosis Date  . Thrombocytopenia ([place]) 2014  . PONV (postoperative nausea and vomiting)     was on dialysis i during pregnancy2014  . Hypertension   . Hemodialysis patient ([place])     Tu, Th, Sat  . Glomerulonephritis   . CKD (chronic kidney disease), stage V [facility])    Past Surgical History  Procedure Laterality Date  . Cesarean section      2009  . Tubal ligation  2014  . Av fistula placement Left   . Ligation of arteriovenous  fistula Left 11/05/2014    Procedure: LIGATION OF LEFT ARM  BRACHIO-CEPHALIC ARTERIOVENOUS  FISTULA ,& REPAIR OF BRACHIAL ARTERY.;  Surgeon: Mal [name], MD;  Location: [place];  Service: Vascular;  Laterality: Left;  . Av fistula placement Right 04/14/2015    Procedure: CREATION OF RIGHT RADIOCEPHALIC ARTERIOVENOUS (AV) FISTULA ;  Surgeon: Angelia Mould, MD;  Location: [place];  Service: Vascular;  Laterality: Right;  . Exchange of a dialysis catheter Right 04/14/2015    Procedure: EXCHANGE OF RIGHT INTERNAL JUGULAR DIALYSIS CATHETER;  Surgeon: Angelia Mould, MD;  Location: [place];  Service: Vascular;  Laterality: Right;   History  reviewed. No pertinent family history. Social History:  reports that she has never smoked. She has never used smokeless tobacco. She reports that she does not drink alcohol or use illicit drugs. No Known Allergies Prior to Admission medications   Not on File   Current Facility-Administered Medications  Medication Dose Route Frequency Provider Last Rate Last Dose  . 0.9 %  sodium chloride infusion   Intravenous Continuous Bonnielee Haff, MD 75 mL/hr at 08/02/15 2355    . 0.9 %  sodium chloride infusion   Intravenous Once Jeryl Columbia, NP      . acetaminophen (TYLENOL) tablet 650 mg  650 mg Oral Q6H PRN Bonnielee Haff, MD       Or  . acetaminophen (TYLENOL) suppository 650 mg  650 mg Rectal Q6H PRN Bonnielee Haff, MD      . morphine 2 MG/ML injection 2 mg  2 mg Intravenous Q3H PRN Bonnielee Haff, MD   2 mg at 08/02/15 2330  . ondansetron (ZOFRAN) tablet 4 mg  4 mg Oral Q6H PRN Bonnielee Haff, MD       Or  . ondansetron [facility]) injection 4 mg  4 mg Intravenous Q6H PRN Bonnielee Haff, MD   4 mg at 08/02/15 2330  . oxyCODONE (Oxy IR/ROXICODONE) immediate release tablet 5 mg  5 mg Oral Q4H PRN Bonnielee Haff, MD      . piperacillin-tazobactam (ZOSYN) IVPB 2.25 g  2.25  g Intravenous 3 times per day Sherwood Gambler, MD 100 mL/hr at 08/03/15 0623 2.25 g at 08/03/15 0623  . sodium chloride 0.9 % injection 3 mL  3 mL Intravenous Q12H Bonnielee Haff, MD   3 mL at 08/02/15 2355   Labs: Basic Metabolic Panel:  Recent Labs Lab 08/02/15 1453 08/03/15 0220  NA 130* 134*  K 4.0 5.1  CL 92* 97*  CO2 21* 22  GLUCOSE 101* 102*  BUN 43* 53*  CREATININE 6.47* 7.57*  CALCIUM 10.6* 8.6*   Liver Function Tests:  Recent Labs Lab 08/02/15 1453 08/03/15 0220  AST 34 23  ALT 31 26  ALKPHOS 72 48  BILITOT 0.9 0.7  PROT 6.9 5.8*  ALBUMIN 3.4* 2.5*   No results for input(s): LIPASE, AMYLASE in the last 168 hours. No results for input(s): AMMONIA in the last 168 hours. CBC:  Recent  Labs Lab 08/02/15 1453 08/02/15 1917 08/03/15 0220  WBC 9.0  --  7.3  NEUTROABS 8.2*  --   --   HGB 7.5*  --  5.9*  HCT 21.5*  --  17.8*  MCV 83.3  --  86.0  PLT 76* 72* 54*   Cardiac Enzymes: No results for input(s): CKTOTAL, CKMB, CKMBINDEX, TROPONINI in the last 168 hours. CBG: No results for input(s): GLUCAP in the last 168 hours. Iron Studies: No results for input(s): IRON, TIBC, TRANSFERRIN, FERRITIN in the last 72 hours. Studies/Results: Ct Abdomen Pelvis Wo Contrast  08/02/2015  CLINICAL DATA:  Possible graft site infection with abdominal pain and sepsis EXAM: CT ABDOMEN AND PELVIS WITHOUT CONTRAST TECHNIQUE: Multidetector CT imaging of the abdomen and pelvis was performed following the standard protocol without IV contrast. COMPARISON:  None. FINDINGS: Bibasilar atelectasis is noted. No sizable effusion is seen. Diffuse decreased attenuation is noted within the cardiac chambers likely related to underlying anemia. Dialysis catheter tips are noted within the right atrium. The liver, gallbladder, spleen, adrenal glands and pancreas are within normal limits. The kidneys are shrunken consistent with the history of end-stage renal disease. No renal calculi or mass lesions are seen. The bladder is partially distended. No pelvic mass lesion or sidewall abnormality is noted. The appendix is well visualized and within normal limits. No free pelvic fluid is seen. The bony structures show no acute abnormality. IMPRESSION: Bibasilar atelectasis. No acute abnormality is identified within the abdomen and pelvis. Electronically Signed   By: Inez Catalina M.D.   On: 08/02/2015 21:05   Dg Chest Port 1 View  08/02/2015  CLINICAL DATA:  Code sepsis.  ESRD. EXAM: PORTABLE CHEST 1 VIEW COMPARISON:  04/14/2015. FINDINGS: Lungs are clear without infiltrate or effusion. Negative for pneumonia Heart size mildly enlarged. Dialysis catheter tips in the SVC and right atrium unchanged. Vascularity normal.  IMPRESSION: No active disease. Electronically Signed   By: Franchot Gallo M.D.   On: 08/02/2015 15:23    Review of Systems: Reports feeling weak for a few day- says feeling better today Complaining of neck pain Denies n/v/d Reports being on her period  Physical Exam: Filed Vitals:   08/03/15 0400 08/03/15 0430 08/03/15 0500 08/03/15 0600  BP: 155/83  187/99 146/74  Pulse: 90 90 93 90  Temp: 97.9 F (36.6 C)     TempSrc: Oral     Resp: 14 13 25 13   Height:      Weight:      SpO2: 99% 100% 100% 99%     General: Well developed, thin, in no acute distress.  Head: Normocephalic, atraumatic, sclera non-icteric, mucus membranes are moist Neck: Supple. JVD not elevated. Lungs: Clear bilaterally to auscultation without wheezes, rales, or rhonchi. Breathing is unlabored. Heart: RRR with S1 S2. No murmurs, rubs, or gallops appreciated. Abdomen: Soft, mild generalized tenderness, non-distended with normoactive bowel sounds. No rebound/guarding. No obvious abdominal masses. M-S:  Strength and tone appear normal for age. Lower extremities:without edema or ischemic changes, no open wounds  Neuro: Alert and oriented X 3. Moves all extremities spontaneously. Psych:  Responds to questions appropriately with a normal affect. Dialysis Access: R IJ cath no signs of infection. R AVF +b/t small  Dialysis Orders: TTS [place] 3hr 45 mins     37.5kgs   2K/3.5Ca+   2800 u heparin Aranesp 100 Q week hectorol 4   Assessment/Plan: 1.  sepsis- tmax 102.9 blood cultures + gram neg rods. Received Vanc 500 yesterday at HD- started zosyn here. abd CT no acute findings/ chest xray clear 2.  ESRD -  [place] placed 6/23- per charge RN at [facility] pt has been refusing cannulation 3.  Hypertension/volume  - BP controlled. Home med- lisinopril. Strop IVF-had been infusing overnight and got a bolus in the ED. 3Kgs over edw- short HD today for volume 4.  Anemia  - hgb 5.9- from 7.5 overnight. hgb 7.9 on 10/6,  Aranesp ordered but not yet started. Dose aranesp tomorrow. For 2 u RBC transfusion today 5.  Metabolic bone disease -  cont hectorol. Last phos 6.8- start binder.  6.  Nutrition - alb 2.5- renal diet.   Shelle Iron, NP [employer] [phone] 08/03/2015, 9:00 AM

## 2015-08-04 DIAGNOSIS — E872 Acidosis, unspecified: Secondary | ICD-10-CM | POA: Diagnosis present

## 2015-08-04 DIAGNOSIS — A419 Sepsis, unspecified organism: Secondary | ICD-10-CM | POA: Diagnosis present

## 2015-08-04 DIAGNOSIS — N186 End stage renal disease: Secondary | ICD-10-CM | POA: Diagnosis present

## 2015-08-04 DIAGNOSIS — Z992 Dependence on renal dialysis: Secondary | ICD-10-CM | POA: Diagnosis present

## 2015-08-04 DIAGNOSIS — R7881 Bacteremia: Secondary | ICD-10-CM | POA: Diagnosis present

## 2015-08-04 DIAGNOSIS — R000 Tachycardia, unspecified: Secondary | ICD-10-CM

## 2015-08-04 DIAGNOSIS — N185 Chronic kidney disease, stage 5: Secondary | ICD-10-CM

## 2015-08-04 LAB — COMPREHENSIVE METABOLIC PANEL
ALBUMIN: 2.5 g/dL — AB (ref 3.5–5.0)
ALT: 28 U/L (ref 14–54)
ANION GAP: 14 (ref 5–15)
AST: 36 U/L (ref 15–41)
Alkaline Phosphatase: 55 U/L (ref 38–126)
BUN: 26 mg/dL — AB (ref 6–20)
CHLORIDE: 94 mmol/L — AB (ref 101–111)
CO2: 26 mmol/L (ref 22–32)
Calcium: 8.4 mg/dL — ABNORMAL LOW (ref 8.9–10.3)
Creatinine, Ser: 5.86 mg/dL — ABNORMAL HIGH (ref 0.44–1.00)
GFR calc Af Amer: 10 mL/min — ABNORMAL LOW (ref >60)
GFR calc non Af Amer: 9 mL/min — ABNORMAL LOW (ref >60)
GLUCOSE: 98 mg/dL (ref 65–99)
POTASSIUM: 4.1 mmol/L (ref 3.5–5.1)
SODIUM: 134 mmol/L — AB (ref 135–145)
TOTAL PROTEIN: 5.9 g/dL — AB (ref 6.5–8.1)
Total Bilirubin: 0.8 mg/dL (ref 0.3–1.2)

## 2015-08-04 LAB — TYPE AND SCREEN
ABO/RH(D): O POS
Antibody Screen: NEGATIVE
Unit division: 0
Unit division: 0

## 2015-08-04 LAB — CBC
HCT: 27.3 % — ABNORMAL LOW (ref 36.0–46.0)
Hemoglobin: 9.1 g/dL — ABNORMAL LOW (ref 12.0–15.0)
MCH: 28.7 pg (ref 26.0–34.0)
MCHC: 33.3 g/dL (ref 30.0–36.0)
MCV: 86.1 fL (ref 78.0–100.0)
PLATELETS: 66 10*3/uL — AB (ref 150–400)
RBC: 3.17 MIL/uL — ABNORMAL LOW (ref 3.87–5.11)
RDW: 13.9 % (ref 11.5–15.5)
WBC: 8.6 10*3/uL (ref 4.0–10.5)

## 2015-08-04 LAB — HEPATITIS B SURFACE ANTIGEN: HEP B S AG: NEGATIVE

## 2015-08-04 MED ORDER — LIDOCAINE HCL (PF) 2 % IJ SOLN
2.0000 mL | Freq: Once | INTRAMUSCULAR | Status: AC
  Administered 2015-08-04: 2 mL
  Filled 2015-08-04: qty 10

## 2015-08-04 MED ORDER — VANCOMYCIN HCL 500 MG IV SOLR
500.0000 mg | INTRAVENOUS | Status: DC
  Filled 2015-08-04: qty 500

## 2015-08-04 NOTE — Progress Notes (Signed)
Admit: 08/02/2015 LOS: 2  26F ESRD [place] [facility] admit with GNR Bacteremia, had  TDC.    Subjective:  B Cx w/ GNR x2 TDC removed, cath tip culture sent this AM Remains on zosyn Febrile with HD yesterday evening, defervesced overnight  2u PRBC yesterday, Hb up appropriately No problems with single needle in AVF, first st ick yesterday Having some thoracic back pain this AM, no tenderness on exam  10/12 0701 - 10/13 0700 In: 730 [P.O.:360; Blood:320; IV Piggyback:50] Out: 2184 [Urine:200]  Filed Weights   08/02/15 1438 08/02/15 2200 08/03/15 1241  Weight: 38.102 kg (84 lb) 40.8 kg (89 lb 15.2 oz) 43.7 kg (96 lb 5.5 oz)    Scheduled Meds: . doxercalciferol  4 mcg Intravenous Q T,Th,Sa-HD  . multivitamin  1 tablet Oral QHS  . piperacillin-tazobactam (ZOSYN)  IV  2.25 g Intravenous 3 times per day  . sevelamer carbonate  800 mg Oral TID WC  . sodium chloride  3 mL Intravenous Q12H   Continuous Infusions:  PRN Meds:.acetaminophen **OR** acetaminophen, ibuprofen, lidocaine, morphine injection, ondansetron **OR** ondansetron (ZOFRAN) IV, oxyCODONE  Current Labs: reviewed  10/11 B Cx x2: GNR in both bottles, speciation/Sn pending 10/11 UCx : mulitple species 10/13 Cath Tip Cx: pending   Physical Exam:  Blood pressure 132/85, pulse 87, temperature 97.8 F (36.6 C), temperature source Oral, resp. rate 11, height 5\' 3"  (1.6 m), weight 43.7 kg (96 lb 5.5 oz), last menstrual period 08/01/2015, SpO2 100 %, unknown if currently breastfeeding. General: Well developed, thin, in no acute distress. Head: Normocephalic, atraumatic, sclera non-icteric, mucus membranes are moist Neck: Supple. JVD not elevated. Lungs: Clear bilaterally to auscultation without wheezes, rales, or rhonchi. Breathing is unlabored. Heart: RRR with S1 S2. No murmurs, rubs, or gallops appreciated. Abdomen: Soft, mild generalized tenderness, non-distended with normoactive bowel sounds. No rebound/guarding. No obvious  abdominal masses. M-S: Strength and tone appear normal for age. Lower extremities:without edema or ischemic changes, no open wounds  Neuro: Alert and oriented X 3. Moves all extremities spontaneously. Psych: Responds to questions appropriately with a normal affect. Dialysis Access: R IJ cath no signs of infection. R AVF +b/t small  Dialysis Orders: TTS [place] 3hr 45 mins 37.5kgs 2K/3.5Ca+ 2800 u heparin Aranesp 100 Q week hectorol 4   A/P 1. GNR (Cx from 10/11) Bacteremia with TDC 1. On Zosyn 2. Afebrile now 3. WBC WNL 4. Await Micro Data 5. [place] Removed 08/04/15, cath tip Cx pending 2. ESRD 1. [place] [facility] Keep on schedule 2. Hold heparin while cannulating AVF 3. 2 needles, 17g on 10/14 4. Cont 2K/3.5Ca 3. HTN/Vol 1. UF goal 3L [place], above EDW 4. Anemia 1. 2/2 acute illness + ESRD 2. 2u PRBC 10/12 3. Cont ESA, monitor 5. 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

## 2015-08-04 NOTE — Progress Notes (Addendum)
[facility] SHORT STAY H&P  CC: ESRD   HPI: Karla Garner is a 29 y.o. female who has been on HD through  functioning Hemodialysis access in the right upper extremity. HD catheter removal secondary to infection. Pt. denies signs of steal syndrome.  Past Medical History  Diagnosis Date  . Thrombocytopenia ([place]) 2014  . PONV (postoperative nausea and vomiting)     was on dialysis i during pregnancy2014  . Hypertension   . Hemodialysis patient ([place])     Tu, Th, Sat  . Glomerulonephritis   . CKD (chronic kidney disease), stage V ([place])     Family Hx History reviewed. No pertinent family history.  Social HX Social History  Substance Use Topics  . Smoking status: Never Smoker   . Smokeless tobacco: Never Used  . Alcohol Use: No    Allergies No Known Allergies  Medications Current Facility-Administered Medications  Medication Dose Route Frequency Provider Last Rate Last Dose  . acetaminophen (TYLENOL) tablet 650 mg  650 mg Oral Q6H PRN Bonnielee Haff, MD   650 mg at 08/03/15 1604   Or  . acetaminophen (TYLENOL) suppository 650 mg  650 mg Rectal Q6H PRN Bonnielee Haff, MD      . doxercalciferol (HECTOROL) injection 4 mcg  4 mcg Intravenous Q T,Th,Sa-HD Shelle Iron, NP      . ibuprofen (ADVIL,MOTRIN) tablet 400 mg  400 mg Oral Q6H PRN Cherene Altes, MD      . lidocaine (XYLOCAINE) 2 % injection 0-20 mL  0-20 mL Intradermal Once PRN Hulen Shouts Rhyne, PA-C      . morphine 2 MG/ML injection 2 mg  2 mg Intravenous Q3H PRN Bonnielee Haff, MD   2 mg at 08/04/15 0734  . multivitamin (RENA-VIT) tablet 1 tablet  1 tablet Oral QHS Shelle Iron, NP   1 tablet at 08/03/15 2114  . ondansetron (ZOFRAN) tablet 4 mg  4 mg Oral Q6H PRN Bonnielee Haff, MD       Or  . ondansetron [facility]) injection 4 mg  4 mg Intravenous Q6H PRN Bonnielee Haff, MD   4 mg at 08/02/15 2330  . oxyCODONE (Oxy IR/ROXICODONE) immediate release tablet 5 mg  5 mg Oral Q4H PRN Bonnielee Haff, MD   5 mg at 08/03/15 1552  . piperacillin-tazobactam (ZOSYN) IVPB 2.25 g  2.25 g Intravenous 3 times per day Cherene Altes, MD   2.25 g at 08/04/15 0235  . sevelamer carbonate (RENVELA) tablet 800 mg  800 mg Oral TID WC Shelle Iron, NP   800 mg at 08/04/15 0950  . sodium chloride 0.9 % injection 3 mL  3 mL Intravenous Q12H Bonnielee Haff, MD   3 mL at 08/04/15 0950    Labs COAG Lab Results  Component Value Date   INR 1.23 08/03/2015   INR 1.26 08/02/2015   INR 1.06 06/24/2015   No results found for: PTT  PHYSICAL EXAM  Filed Vitals:   08/04/15 0928  BP: 132/85  Pulse: 87  Temp: 97.8 F (36.6 C)  Resp: 11    General:  WDWN in NAD HENT: WNL Eyes: Pupils equal Pulmonary: normal non-labored breathing  Cardiac: RRR, Skin: normal, no cyanosis, jaundice, pallor or bruising Vascular Exam/Pulses: 2+ radial pulses in RIGHT upper extremity. Extremities without ischemic changes, no Gangrene , no cellulitis; no open wounds;   There is a good thrill and good bruit in the right fore arm av fistula. Hand grip is 5/5 and sensation  in digits is intact;   Impression: This is a 29 y.o. female who has a functioning HD access.  Plan: Removal of Right IJ HD catheter Theda Sers Elnor [facility] 08/04/2015 10:45 AM   We will let the HD lab stick the fistula for dialysis tomorrow and if there are any problems she will need a new HD catheter.  Please let us know.

## 2015-08-04 NOTE — Progress Notes (Signed)
[place] TEAM 1 - Stepdown/ICU TEAM Progress Note  Karla Garner [id number] DOB: 06/10/1986 DOA: 08/02/2015 PCP: No primary care provider on file.  Admit HPI / Brief Narrative: 29 y.o. HF (Spanish speaker) PMHx HTN, Thrombocytopenia, Glomerulonephritis with CKD . She has been on and off of dialysis over the past 2 years. As of this June she had been off of dialysis for a year and a half. This June she was hospitalized and was started on hemodialysis again. A right IJ tunneled dialysis catheter was placed. A right arm fistula was created. This was done on June 23. She was in her usual state of health until about 3-4 days prior to admission when she started feeling weak. She developed a fever. She started having nausea, and vomiting with diffuse abdominal pain. She was seen in her dialysis center for her usual session and was found have a fever of 103F. She was dialyzed for 2 hours. She was given 500 milligrams of vancomycin and subsequently was transferred to the emergency department for further management.   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  Nephrology following  Thrombocytopenia -Common in gram- rod sepsis -Slowly trending up - no evidence of active bleeding presently  Abdom pain w/ nausea and vomiting  -Abdominal pain resolved  CT abdom w/o acute findings - appears to have ceased  Normocytic anemia  Due to ESRD +/- component of hemolysis in setting of gram neg rod bacteremia +  menstrual loss - follow Hgb  - s/p 2U PRBC thus far     Code Status: FULL Family Communication: no family present at time of exam Disposition Plan: Resolution sepsis    Consultants: Dr.Ryan Modesta Messing (nephrology)  Procedure/Significant Events:    Culture 10/11 blood left AC/wrist positive GNR 10/11 urine positive multiple species 10/12 MRSA by PCR negative 10/13 catheter tip pending   Antibiotics: Zosyn 10/11 > Vanc 10/11 >   DVT prophylaxis: SCD   Devices    LINES / TUBES:      Continuous Infusions:   Objective: VITAL SIGNS: Temp: 97.8 F (36.6 C) (10/13 0928) Temp Source: Oral (10/13 0928) BP: 132/85 mmHg (10/13 0928) Pulse Rate: 87 (10/13 0928) SPO2; FIO2:   Intake/Output Summary (Last 24 hours) at 08/04/15 1157 Last data filed at 08/03/15 1908  Gross per 24 hour  Intake    490 ml  Output   2184 ml  Net  -1694 ml     Exam: General: A/O 4, NAD, No acute respiratory distress Eyes: Negative headache, eye pain, double vision,negative scleral hemorrhage ENT: Negative Runny nose, negative ear pain, negative gingival bleeding, Neck:  Negative scars, masses, torticollis, lymphadenopathy, JVD, right Vas-Cath removed covered and clean, negative sign of infection Lungs: Clear to auscultation bilaterally without wheezes or crackles Cardiovascular: Regular rate and rhythm without murmur gallop or rub normal S1 and S2 Abdomen:negative abdominal pain, nondistended, positive soft, bowel sounds, no rebound, no ascites, no appreciable mass Extremities: No significant cyanosis, clubbing, or edema bilateral lower extremities Psychiatric:  Negative depression, negative anxiety, negative fatigue, negative mania  Neurologic:  Cranial nerves II through XII  intact, tongue/uvula midline, all extremities muscle strength 5/5, sensation intact throughout,  negative dysarthria, negative expressive aphasia, negative receptive aphasia.   Data Reviewed: Basic  Metabolic Panel:  Recent Labs Lab 08/02/15 1453 08/03/15 0220 08/04/15 0325  NA 130* 134* 134*  K 4.0 5.1 4.1  CL 92* 97* 94*  CO2 21* 22 26  GLUCOSE 101* 102* 98  BUN 43* 53* 26*  CREATININE 6.47* 7.57* 5.86*  CALCIUM 10.6* 8.6* 8.4*   Liver Function Tests:  Recent Labs Lab 08/02/15 1453 08/03/15 0220 08/04/15 0325  AST 34 23 36  ALT 31 26 28   ALKPHOS 72 48 55  BILITOT 0.9 0.7 0.8  PROT 6.9 5.8* 5.9*  ALBUMIN 3.4* 2.5* 2.5*   No results for input(s): LIPASE, AMYLASE in the last 168 hours. No results for input(s): AMMONIA in the last 168 hours. CBC:  Recent Labs Lab 08/02/15 1453 08/02/15 1917 08/03/15 0220 08/03/15 2106 08/04/15 0325  WBC 9.0  --  7.3  --  8.6  NEUTROABS 8.2*  --   --   --   --   HGB 7.5*  --  5.9* 9.5* 9.1*  HCT 21.5*  --  17.8* 27.6* 27.3*  MCV 83.3  --  86.0  --  86.1  PLT 76* 72* 54*  --  66*   Cardiac Enzymes: No results for input(s): CKTOTAL, CKMB, CKMBINDEX, TROPONINI in the last 168 hours. BNP (last 3 results) No results for input(s): BNP in the last 8760 hours.  ProBNP (last 3 results) No results for input(s): PROBNP in the last 8760 hours.  CBG: No results for input(s): GLUCAP in the last 168 hours.  Recent Results (from the past 240 hour(s))  Blood Culture (routine x 2)     Status: None (Preliminary result)   Collection Time: 08/02/15  2:51 PM  Result Value Ref Range Status   Specimen Description BLOOD LEFT ANTECUBITAL  Final   Special Requests BOTTLES DRAWN AEROBIC AND ANAEROBIC 5CC  Final   Culture  Setup Time   Final    GRAM NEGATIVE RODS IN BOTH AEROBIC AND ANAEROBIC BOTTLES CRITICAL RESULT CALLED TO, READ BACK BY AND VERIFIED WITH: T NEILSON @0605  08/03/15 MKELLY    Culture GRAM NEGATIVE RODS  Final   Report Status PENDING  Incomplete  Blood Culture (routine x 2)     Status: None (Preliminary result)   Collection Time: 08/02/15  2:52 PM  Result Value Ref Range Status   Specimen Description BLOOD LEFT WRIST   Final   Special Requests BOTTLES DRAWN AEROBIC AND ANAEROBIC 5CC  Final   Culture  Setup Time   Final    GRAM NEGATIVE RODS ANAEROBIC BOTTLE ONLY CRITICAL RESULT CALLED TO, READ BACK BY AND VERIFIED WITH: Tyson Babinski RN [id number] 08/03/15 A BROWNING    Culture GRAM NEGATIVE RODS  Final   Report Status PENDING  Incomplete  Urine culture     Status: None   Collection Time: 08/02/15  3:30 PM  Result Value Ref Range Status   Specimen Description URINE, RANDOM  Final   Special Requests NONE  Final   Culture MULTIPLE SPECIES PRESENT, SUGGEST RECOLLECTION  Final   Report Status 08/03/2015 FINAL  Final  MRSA PCR Screening     Status: None   Collection Time: 08/03/15 12:38 AM  Result Value Ref Range Status   MRSA by PCR NEGATIVE NEGATIVE Final    Comment:        The GeneXpert MRSA Assay (FDA approved for NASAL specimens  only), is one component of a comprehensive MRSA colonization surveillance program. It is not intended to diagnose MRSA infection nor to guide or monitor treatment for MRSA infections.      Studies:  Recent x-ray studies have been reviewed in detail by the Attending Physician  Scheduled Meds:  Scheduled Meds: . doxercalciferol  4 mcg Intravenous Q T,Th,Sa-HD  . multivitamin  1 tablet Oral QHS  . piperacillin-tazobactam (ZOSYN)  IV  2.25 g Intravenous 3 times per day  . sevelamer carbonate  800 mg Oral TID WC  . sodium chloride  3 mL Intravenous Q12H  . vancomycin  500 mg Intravenous Q T,Th,Sa-HD    Time spent on care of this patient: 40 mins   Saverio Kader, Geraldo Docker , MD  [facility] Office  [phone] Pager - [phone]  On-Call/Text Page:      [email or web address]      password [email or web address]  If 7PM-7AM, please contact night-coverage [email or web address] Password [email or web address] 08/04/2015, 11:57 AM   LOS: 2 days   Care during the described time interval was provided by me .  I have reviewed this patient's available data, including medical history, events of note, physical  examination, and all test results as part of my evaluation. I have personally reviewed and interpreted all radiology studies.   Dia Crawford, MD [phone] Pager

## 2015-08-04 NOTE — Progress Notes (Signed)
[facility] Catheter Removal Procedure Note  Diagnosis: ESRD with Functioning AVF/AVGG  Plan:  Remove right diatek catheter  Consent signed:  yes Time out completed:  yes Coumadin:  No. PT/INR (if applicable):   Other labs:   Procedure: 1.  Sterile prepping and draping over catheter area 2. 5 ml 2% lidocaine plain instilled at removal site. 3.  right catheter removed in its entirety with cuff in tact. 4.  Complications: none  5. Tip of catheter sent for culture:  Yes.     Patient tolerated procedure well:  yes Pressure held, no bleeding noted, dressing applied Instructions given to the pt regarding wound care and bleeding.  OtherLaurence Slate [facility] 08/04/2015 10:49 AM

## 2015-08-05 LAB — CULTURE, BLOOD (ROUTINE X 2)

## 2015-08-05 LAB — URINALYSIS, ROUTINE W REFLEX MICROSCOPIC
BILIRUBIN URINE: NEGATIVE
GLUCOSE, UA: 250 mg/dL — AB
KETONES UR: NEGATIVE mg/dL
Leukocytes, UA: NEGATIVE
NITRITE: NEGATIVE
PH: 8 (ref 5.0–8.0)
PROTEIN: 100 mg/dL — AB
SPECIFIC GRAVITY, URINE: 1.006 (ref 1.005–1.030)
Urobilinogen, UA: 0.2 mg/dL (ref 0.0–1.0)

## 2015-08-05 LAB — URINE MICROSCOPIC-ADD ON

## 2015-08-05 MED ORDER — DARBEPOETIN ALFA 100 MCG/0.5ML IJ SOSY: 100.0000 ug | PREFILLED_SYRINGE | INTRAMUSCULAR | Status: DC

## 2015-08-05 MED ORDER — LORATADINE 10 MG PO TABS
10.0000 mg | ORAL_TABLET | Freq: Every day | ORAL | Status: DC | PRN
  Administered 2015-08-05: 10 mg via ORAL
  Filled 2015-08-05: qty 1

## 2015-08-05 NOTE — Progress Notes (Signed)
[place] TEAM 1 - Stepdown/ICU TEAM PROGRESS NOTE  Karla Garner [id number] DOB: [birth year] DOA: 08/02/2015 PCP: No primary care provider on file.  Admit HPI / Brief Narrative: 29 y.o. female with a history of glomerulonephritis with chronic kidney disease. She has been on and off of dialysis over the past 2 years. As of this June she had been off of dialysis for a year and a half. This June she was hospitalized and was started on hemodialysis again. A right IJ tunneled dialysis catheter was placed. A right arm fistula was created. This was done on June 23. She was in her usual state of health until about 3-4 days prior to admission when she started feeling weak. She developed a fever. She started having nausea, and vomiting with diffuse abdominal pain. She was seen in her dialysis center for her usual session and was found have a fever of 103F. She was dialyzed for 2 hours. She was given 500 milligrams of vancomycin and subsequently was transferred to the emergency department for further management.  HPI/Subjective: The patient is resting comfortably in bed.  She has no complaints at this time.  She appears comfortable and in no acute distress.  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  Nephrology following - unable to use AVF 10/14 - Vas Surg to place new HD cath 10/15  Thrombocytopenia Common in gram- rod sepsis - no evidence of active bleeding presently - slowly  improving   Abdom pain w/ nausea and vomiting  CT abdom w/o acute findings - ceased  Normocytic anemia  Due to ESRD +/- component of hemolysis in setting of gram neg rod bacteremia + menstrual loss - follow Hgb - s/p 2U PRBC thus far - holding stable at ~9.0  Code Status: FULL Family Communication: no family present at time of exam Disposition Plan: Transfer to renal floor - hemodialysis access per vascular surgery - complete prolonged course of antibiotic therapy - monitor for recurrence of fever   Consultants: Nephrology  Vascular surgery  Procedures: 10/13 - hemodialysis catheter removal  Antibiotics: Zosyn 10/11 > Vanc 10/11 > 10/14  DVT prophylaxis: SCDs  Objective: Blood pressure 136/94, pulse 81, temperature 98.3 F (36.8 C), temperature source Oral, resp. rate 16, height 5\' 3"  (1.6 m), weight 41.3 kg (91 lb 0.8 oz), last menstrual period 08/01/2015, SpO2 100 %, unknown if currently breastfeeding.  Intake/Output Summary (Last 24 hours) at 08/05/15 1611 Last data filed at 08/05/15 1345  Gross per 24 hour  Intake    390 ml  Output      0 ml  Net    390 ml   Exam: General: No acute respiratory distress - alert  Lungs: Clear to auscultation bilaterally without wheezes / crackles Cardiovascular: Regular rate and rhythm without murmur gallop or rub Abdomen: Nontender, nondistended, soft, bowel sounds positive, no rebound, no ascites, no appreciable mass Extremities: No significant cyanosis, clubbing, edema bilateral lower extremities  Data Reviewed: Basic Metabolic Panel:  Recent Labs Lab 08/02/15  1453 08/03/15 0220 08/04/15 0325  NA 130* 134* 134*  K 4.0 5.1 4.1  CL 92* 97* 94*  CO2 21* 22 26  GLUCOSE 101* 102* 98  BUN 43* 53* 26*  CREATININE 6.47* 7.57* 5.86*  CALCIUM 10.6* 8.6* 8.4*    CBC:  Recent Labs Lab 08/02/15 1453 08/02/15 1917 08/03/15 0220 08/03/15 2106 08/04/15 0325  WBC 9.0  --  7.3  --  8.6  NEUTROABS 8.2*  --   --   --   --     HGB 7.5*  --  5.9* 9.5* 9.1*  HCT 21.5*  --  17.8* 27.6* 27.3*  MCV 83.3  --  86.0  --  86.1  PLT 76* 72* 54*  --  66*    Liver Function Tests:  Recent Labs Lab 08/02/15 1453 08/03/15 0220 08/04/15 0325  AST 34 23 36  ALT 31 26 28   ALKPHOS 72 48 55  BILITOT 0.9 0.7 0.8  PROT 6.9 5.8* 5.9*  ALBUMIN 3.4* 2.5* 2.5*    Coags:  Recent Labs Lab 08/02/15 1917 08/03/15 0220  INR 1.26 1.23    Recent Labs Lab 08/02/15 1917 08/03/15 0220  APTT 29 30    Recent Results (from the past 240 hour(s))  Blood Culture (routine x 2)     Status: None   Collection Time: 08/02/15  2:51 PM  Result Value Ref Range Status   Specimen Description BLOOD LEFT ANTECUBITAL  Final   Special Requests BOTTLES DRAWN AEROBIC AND ANAEROBIC 5CC  Final   Culture  Setup Time   Final    GRAM NEGATIVE RODS IN BOTH AEROBIC AND ANAEROBIC BOTTLES CRITICAL RESULT CALLED TO, READ BACK BY AND VERIFIED WITH: T NEILSON @0605  08/03/15 MKELLY    Culture ENTEROBACTER CLOACAE  Final   Report Status 08/05/2015 FINAL  Final   Organism ID, Bacteria ENTEROBACTER CLOACAE  Final      Susceptibility   Enterobacter cloacae - MIC*    CEFAZOLIN >=64 RESISTANT Resistant     CEFEPIME <=1 SENSITIVE Sensitive     CEFTAZIDIME <=1 SENSITIVE Sensitive     CEFTRIAXONE <=1 SENSITIVE Sensitive     CIPROFLOXACIN <=0.25 SENSITIVE Sensitive     GENTAMICIN <=1 SENSITIVE Sensitive     IMIPENEM 0.5 SENSITIVE Sensitive     TRIMETH/SULFA <=20 SENSITIVE Sensitive     PIP/TAZO <=4 SENSITIVE Sensitive     * ENTEROBACTER CLOACAE  Blood Culture (routine x 2)     Status: None   Collection Time: 08/02/15  2:52 PM  Result Value Ref Range Status   Specimen Description BLOOD LEFT WRIST  Final   Special Requests BOTTLES DRAWN AEROBIC AND ANAEROBIC 5CC  Final   Culture  Setup Time   Final    GRAM NEGATIVE RODS IN BOTH AEROBIC AND ANAEROBIC BOTTLES CRITICAL RESULT CALLED TO, READ BACK BY AND VERIFIED WITH: Tyson Babinski RN [id number] 08/03/15 A  BROWNING    Culture   Final    ENTEROBACTER CLOACAE SUSCEPTIBILITIES PERFORMED ON PREVIOUS CULTURE WITHIN THE LAST 5 DAYS.    Report Status 08/05/2015 FINAL  Final  Urine culture     Status: None   Collection Time: 08/02/15  3:30 PM  Result Value Ref Range Status   Specimen Description URINE, RANDOM  Final   Special Requests NONE  Final   Culture MULTIPLE SPECIES PRESENT, SUGGEST RECOLLECTION  Final   Report Status 08/03/2015 FINAL  Final  MRSA PCR Screening     Status: None   Collection Time:  08/03/15 12:38 AM  Result Value Ref Range Status   MRSA by PCR NEGATIVE NEGATIVE Final    Comment:        The GeneXpert MRSA Assay (FDA approved for NASAL specimens only), is one component of a comprehensive MRSA colonization surveillance program. It is not intended to diagnose MRSA infection nor to guide or monitor treatment for MRSA infections.   Cath Tip Culture     Status: None (Preliminary result)   Collection Time: 08/04/15  8:03 AM  Result Value Ref Range Status   Specimen Description HEMODIALYSIS CATHETER  Final   Special Requests NONE  Final   Culture NO GROWTH Performed at [facility]   Final   Report Status PENDING  Incomplete     Studies:   Recent x-ray studies have been reviewed in detail by the Attending Physician  Scheduled Meds:  Scheduled Meds: . [START ON 08/09/2015] darbepoetin (ARANESP) injection - DIALYSIS  100 mcg Intravenous Q Tue-HD  . doxercalciferol  4 mcg Intravenous Q T,Th,Sa-HD  . multivitamin  1 tablet Oral QHS  . piperacillin-tazobactam (ZOSYN)  IV  2.25 g Intravenous 3 times per day  . sevelamer carbonate  800 mg Oral TID WC  . sodium chloride  3 mL Intravenous Q12H  . vancomycin  500 mg Intravenous Q T,Th,Sa-HD    Time spent on care of this patient: 25 mins   MCCLUNG,JEFFREY T , MD   [facility] Office  [phone] Pager - Text Page per Shea Evans as per below:  On-Call/Text Page:      [email or web address]      password  [email or web address]  If 7PM-7AM, please contact night-coverage [email or web address] Password [email or web address] 08/05/2015, 4:11 PM   LOS: 3 days

## 2015-08-05 NOTE — Progress Notes (Signed)
Fistula unable to used today.  Will schedule for diatek catheter placement tomorrow after a 48 hour holiday from the catheter.  Npo after MN Consent   Leontine Locket 08/05/2015 10:32 AM

## 2015-08-05 NOTE — Progress Notes (Signed)
Neph MD paged, called back. Updated on multiple Rn assessments and attempt to access unsuccessfully in RAVF. Pt to go back to room for now

## 2015-08-05 NOTE — Progress Notes (Signed)
RN utilized interpreter Lesle Chris to obtain consent. Pt wishes to sign consent tomorrow after she has been assured that staff have tried their best to use the fistula. She wishes to have an interpreter tomorrow.

## 2015-08-05 NOTE — Progress Notes (Signed)
Interpreter Lesle Chris for [employer]

## 2015-08-05 NOTE — Progress Notes (Signed)
ANTIBIOTIC CONSULT NOTE - FOLLOW UP  Pharmacy Consult for Vanco/Zosyn Indication: Sepsis  No Known Allergies  Patient Measurements: Height: 5\' 3"  (160 cm) Weight: 91 lb 0.8 oz (41.3 kg) IBW/kg (Calculated) : 52.4 Adjusted Body Weight:    Vital Signs: Temp: 97.8 F (36.6 C) (10/14 0853) Temp Source: Oral (10/14 0853) BP: 145/94 mmHg (10/14 0853) Pulse Rate: 81 (10/14 0853) Intake/Output from previous day: 10/13 0701 - 10/14 0700 In: 150 [IV Piggyback:150] Out: -  Intake/Output from this shift: Total I/O In: 170 [P.O.:120; IV Piggyback:50] Out: -   Labs:  Recent Labs  08/02/15 1453 08/02/15 1917 08/03/15 0220 08/03/15 2106 08/04/15 0325  WBC 9.0  --  7.3  --  8.6  HGB 7.5*  --  5.9* 9.5* 9.1*  PLT 76* 72* 54*  --  66*  CREATININE 6.47*  --  7.57*  --  5.86*   Estimated Creatinine Clearance: 9.4 mL/min (by C-G formula based on Cr of 5.86). No results for input(s): VANCOTROUGH, VANCOPEAK, VANCORANDOM, GENTTROUGH, GENTPEAK, GENTRANDOM, TOBRATROUGH, TOBRAPEAK, TOBRARND, AMIKACINPEAK, AMIKACINTROU, AMIKACIN in the last 72 hours.   Assessment: 61 yoF admitted from dialysis unit with suspected graft site infection. Received 500 mg IV Vancomycin in HD prior 10/11 to admission.Code sepsis called in ER and pt began on Zosyn and to continue Vancomycin.  Infectious Disease:GNR bacteremia. HD Catheter removed.  LA 2.9 > 0.6, PCT 50, Now afebrile. No labs today.  Zosyn: 10/11 >  Vancomycin: 10/11 >  10/13: Cath tip cx>> 10/11 blood cx: Enterobacter only R to Ancef. Sensitive to zosyn. 10/11 urine: ngtd 10/11 mrsa: neg   Plan:  - Zosyn 2.25 gm Q8H - Vanc 500 mg IV qHD-TuThSat  - Narrow to Zosyn alone vs wait on cath tip cultures.   Antar Milks S. Alford Highland, PharmD, BCPS Clinical Staff Pharmacist Pager [phone]  Eilene Ghazi Stillinger 08/05/2015,12:51 PM

## 2015-08-05 NOTE — Progress Notes (Signed)
Admit: 08/02/2015 LOS: 3  51F ESRD [place] [facility] admit with GNR Bacteremia, had  TDC.    Subjective:  B Cx w/ GNR x2 TDC removed 10/13, cath tip culture sent this AM Remains on zosyn Afebrile last 24 hours  Unable to use AVF today     Filed Weights   08/02/15 2200 08/03/15 1241 08/05/15 0700  Weight: 40.8 kg (89 lb 15.2 oz) 43.7 kg (96 lb 5.5 oz) 41.3 kg (91 lb 0.8 oz)    Scheduled Meds: . doxercalciferol  4 mcg Intravenous Q T,Th,Sa-HD  . multivitamin  1 tablet Oral QHS  . piperacillin-tazobactam (ZOSYN)  IV  2.25 g Intravenous 3 times per day  . sevelamer carbonate  800 mg Oral TID WC  . sodium chloride  3 mL Intravenous Q12H  . vancomycin  500 mg Intravenous Q T,Th,Sa-HD   Continuous Infusions:  PRN Meds:.acetaminophen **OR** acetaminophen, ibuprofen, lidocaine, morphine injection, ondansetron **OR** ondansetron (ZOFRAN) IV, oxyCODONE  Current Labs: reviewed  10/11 B Cx x2: GNR in both bottles, speciation/Sn pending 10/11 UCx : mulitple species 10/13 Cath Tip Cx: pending   Physical Exam:  Blood pressure 162/101, pulse 72, temperature 97.8 F (36.6 C), temperature source Oral, resp. rate 20, height 5\' 3"  (1.6 m), weight 41.3 kg (91 lb 0.8 oz), last menstrual period 08/01/2015, SpO2 100 %, unknown if currently breastfeeding. General: Well developed, thin, in no acute distress. Language barrier Head: Normocephalic, atraumatic, sclera non-icteric, mucus membranes are moist Neck: Supple. JVD not elevated. Lungs: Clear bilaterally to auscultation without wheezes, rales, or rhonchi. Breathing is unlabored. Heart: RRR with S1 S2. No murmurs, rubs, or gallops appreciated. Abdomen: Soft, mild generalized tenderness, non-distended with normoactive bowel sounds. No rebound/guarding. No obvious abdominal masses. M-S: Strength and tone appear normal for age. Lower extremities:without edema or ischemic changes, no open wounds  Neuro: Alert and oriented X 3. Moves all extremities  spontaneously. Psych: Responds to questions appropriately with a normal affect. Dialysis Access: R IJ cath no signs of infection. R AVF +b/t small  Dialysis Orders: TTS [place] 3hr 45 mins 37.5kgs 2K/3.5Ca+ 2800 u heparin Aranesp 100 Q week hectorol 4   A/P 1. GNR (Cx from 10/11) Bacteremia with TDC- no speciation yet 1. On Zosyn- no speciation yet-  could probably d/c vanc 2. Afebrile now 3. WBC WNL 4. Await Micro Data 5. [place] Removed 08/04/15, cath tip Cx pending 2. ESRD 1. [place] [facility] Keep on schedule- last HD was Saturday 2. Hold heparin while cannulating AVF 3. Unable to use AVF today- is small will call back VVS- likely will need new PC and maybe intervention on AVFR ???- will plan for HD tomorrow 4. Cont 2K/3.5Ca 3. HTN/Vol 1. UF goal 3L toorrow, above EDW 4. Anemia 1. 2/2 acute illness + ESRD 2. 2u PRBC 10/12 3. 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

## 2015-08-06 ENCOUNTER — Encounter (HOSPITAL_COMMUNITY): Payer: Self-pay | Admitting: Anesthesiology

## 2015-08-06 DIAGNOSIS — D631 Anemia in chronic kidney disease: Secondary | ICD-10-CM

## 2015-08-06 DIAGNOSIS — N185 Chronic kidney disease, stage 5: Secondary | ICD-10-CM

## 2015-08-06 DIAGNOSIS — R1010 Upper abdominal pain, unspecified: Secondary | ICD-10-CM

## 2015-08-06 DIAGNOSIS — R7881 Bacteremia: Secondary | ICD-10-CM | POA: Diagnosis present

## 2015-08-06 LAB — RENAL FUNCTION PANEL
ALBUMIN: 2.6 g/dL — AB (ref 3.5–5.0)
Anion gap: 16 — ABNORMAL HIGH (ref 5–15)
BUN: 55 mg/dL — AB (ref 6–20)
CALCIUM: 7.9 mg/dL — AB (ref 8.9–10.3)
CO2: 21 mmol/L — ABNORMAL LOW (ref 22–32)
CREATININE: 10.7 mg/dL — AB (ref 0.44–1.00)
Chloride: 95 mmol/L — ABNORMAL LOW (ref 101–111)
GFR calc Af Amer: 5 mL/min — ABNORMAL LOW (ref >60)
GFR, EST NON AFRICAN AMERICAN: 4 mL/min — AB (ref >60)
GLUCOSE: 91 mg/dL (ref 65–99)
PHOSPHORUS: 6.8 mg/dL — AB (ref 2.5–4.6)
POTASSIUM: 4.2 mmol/L (ref 3.5–5.1)
SODIUM: 132 mmol/L — AB (ref 135–145)

## 2015-08-06 LAB — CBC
HCT: 27.6 % — ABNORMAL LOW (ref 36.0–46.0)
HEMOGLOBIN: 9.6 g/dL — AB (ref 12.0–15.0)
MCH: 29.8 pg (ref 26.0–34.0)
MCHC: 34.8 g/dL (ref 30.0–36.0)
MCV: 85.7 fL (ref 78.0–100.0)
PLATELETS: 134 10*3/uL — AB (ref 150–400)
RBC: 3.22 MIL/uL — AB (ref 3.87–5.11)
RDW: 13.1 % (ref 11.5–15.5)
WBC: 7.8 10*3/uL (ref 4.0–10.5)

## 2015-08-06 LAB — URINE CULTURE: CULTURE: NO GROWTH

## 2015-08-06 LAB — IRON AND TIBC
IRON: 170 ug/dL (ref 28–170)
Saturation Ratios: 84 % — ABNORMAL HIGH (ref 10.4–31.8)
TIBC: 203 ug/dL — ABNORMAL LOW (ref 250–450)
UIBC: 33 ug/dL

## 2015-08-06 LAB — FERRITIN: Ferritin: 737 ng/mL — ABNORMAL HIGH (ref 11–307)

## 2015-08-06 MED ORDER — HEPARIN SODIUM (PORCINE) 1000 UNIT/ML DIALYSIS: 20.0000 [IU]/kg | INTRAMUSCULAR | Status: DC | PRN

## 2015-08-06 MED ORDER — PROPOFOL 10 MG/ML IV BOLUS
INTRAVENOUS | Status: AC
  Filled 2015-08-06: qty 20

## 2015-08-06 MED ORDER — SUCCINYLCHOLINE CHLORIDE 20 MG/ML IJ SOLN
INTRAMUSCULAR | Status: AC
  Filled 2015-08-06: qty 1

## 2015-08-06 MED ORDER — MIDAZOLAM HCL 2 MG/2ML IJ SOLN
INTRAMUSCULAR | Status: AC
  Filled 2015-08-06: qty 4

## 2015-08-06 MED ORDER — LIDOCAINE HCL (CARDIAC) 20 MG/ML IV SOLN
INTRAVENOUS | Status: AC
  Filled 2015-08-06: qty 5

## 2015-08-06 MED ORDER — DOXERCALCIFEROL 4 MCG/2ML IV SOLN
INTRAVENOUS | Status: AC
  Administered 2015-08-06: 4 ug via INTRAVENOUS
  Filled 2015-08-06: qty 2

## 2015-08-06 MED ORDER — DEXTROSE 5 % IV SOLN
2.0000 g | INTRAVENOUS | Status: DC
  Administered 2015-08-06: 2 g via INTRAVENOUS
  Filled 2015-08-06 (×2): qty 2

## 2015-08-06 MED ORDER — EPHEDRINE SULFATE 50 MG/ML IJ SOLN
INTRAMUSCULAR | Status: AC
  Filled 2015-08-06: qty 1

## 2015-08-06 MED ORDER — SODIUM CHLORIDE 0.9 % IJ SOLN
INTRAMUSCULAR | Status: AC
  Filled 2015-08-06: qty 10

## 2015-08-06 MED ORDER — FENTANYL CITRATE (PF) 250 MCG/5ML IJ SOLN
INTRAMUSCULAR | Status: AC
  Filled 2015-08-06: qty 5

## 2015-08-06 MED ORDER — ONDANSETRON HCL 4 MG/2ML IJ SOLN
INTRAMUSCULAR | Status: AC
  Filled 2015-08-06: qty 2

## 2015-08-06 MED ORDER — GLYCOPYRROLATE 0.2 MG/ML IJ SOLN
INTRAMUSCULAR | Status: AC
  Filled 2015-08-06: qty 1

## 2015-08-06 NOTE — Progress Notes (Signed)
Admit: 08/02/2015 LOS: 4  23F ESRD [facility] admit with Enterobacter Bacteremia, had  TDC.    Subjective:  B Cx w/ enterobacter TDC removed 10/13, cath tip culture negative Remains on zosyn Afebrile  Seen on HD today- cannulation of AVF was successful !  Unable to use AVF today  10/14 0701 - 10/15 0700 In: 580 [P.O.:480; IV Piggyback:100] Out: 0   Filed Weights   08/05/15 0700 08/05/15 2059 08/06/15 0830  Weight: 41.3 kg (91 lb 0.8 oz) 42.5 kg (93 lb 11.1 oz) 42.4 kg (93 lb 7.6 oz)    Scheduled Meds: . [START ON 08/09/2015] darbepoetin (ARANESP) injection - DIALYSIS  100 mcg Intravenous Q Tue-HD  . doxercalciferol  4 mcg Intravenous Q T,Th,Sa-HD  . multivitamin  1 tablet Oral QHS  . piperacillin-tazobactam (ZOSYN)  IV  2.25 g Intravenous 3 times per day  . sevelamer carbonate  800 mg Oral TID WC  . sodium chloride  3 mL Intravenous Q12H   Continuous Infusions:  PRN Meds:.acetaminophen **OR** acetaminophen, heparin, ibuprofen, lidocaine, loratadine, morphine injection, ondansetron **OR** ondansetron (ZOFRAN) IV, oxyCODONE  Current Labs: reviewed  10/11 B Cx x2: GNR in both bottles, speciation/Sn pending 10/11 UCx : mulitple species 10/13 Cath Tip Cx: pending   Physical Exam:  Blood pressure 156/94, pulse 66, temperature 97.8 F (36.6 C), temperature source Oral, resp. rate 20, height 5\' 3"  (1.6 m), weight 42.4 kg (93 lb 7.6 oz), last menstrual period 08/01/2015, SpO2 99 %, unknown if currently breastfeeding. General: Well developed, thin, in no acute distress. Language barrier Head: Normocephalic, atraumatic, sclera non-icteric, mucus membranes are moist Neck: Supple. JVD not elevated. Lungs: Clear bilaterally to auscultation without wheezes, rales, or rhonchi. Breathing is unlabored. Heart: RRR with S1 S2. No murmurs, rubs, or gallops appreciated. Abdomen: Soft, mild generalized tenderness, non-distended with normoactive bowel sounds. No rebound/guarding. No obvious  abdominal masses. M-S: Strength and tone appear normal for age. Lower extremities:without edema or ischemic changes, no open wounds  Neuro: Alert and oriented X 3. Moves all extremities spontaneously. Psych: Responds to questions appropriately with a normal affect. Dialysis Access: . R AVF +b/t small  Dialysis Orders: TTS [place] 3hr 45 mins 37.5kgs 2K/3.5Ca+ 2800 u heparin Aranesp 100 Q week hectorol 4   A/P 1. Enterobacter (Cx from 10/11) Bacteremia with TDC-  1. On Zosyn- is sensitive to fortaz that we can give with HD 2. Afebrile now 3. WBC WNL 4. [facility] Removed 08/04/15 2. ESRD 1. [place] [facility] Keep on schedule-  2. Hold heparin while cannulating AVF 3. Able  to use AVF today ! - is small , I guess since able to [place] today will continue to use- as OP- next on Tuesday- if continued difficulty, can get procedures as OP 4. Cont 2K/3.5Ca 3. HTN/Vol 1. UF goal 3L , above EDW- small girl 4. Anemia 1. 2/2 acute illness + ESRD 2. 2u PRBC 10/12 3. Add esa here- check iron 5. 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 Dispo-  could be discharged with HD and abx at OP dialysis- will do OP procedures if continued access difficulties   Greenleigh Kauth A   08/06/2015, 10:47 AM   Recent Labs Lab 08/03/15 0220 08/04/15 0325 08/06/15 0600  NA 134* 134* 132*  K 5.1 4.1 4.2  CL 97* 94* 95*  CO2 22 26 21*  GLUCOSE 102* 98 91  BUN 53* 26* 55*  CREATININE 7.57* 5.86* 10.70*  CALCIUM 8.6* 8.4* 7.9*  PHOS  --   --  6.8*    Recent Labs Lab 08/02/15 1453  08/03/15 0220 08/03/15 2106 08/04/15 0325 08/06/15 0600  WBC 9.0  --  7.3  --  8.6 7.8  NEUTROABS 8.2*  --   --   --   --   --   HGB 7.5*  --  5.9* 9.5* 9.1* 9.6*  HCT 21.5*  --  17.8* 27.6* 27.3* 27.6*  MCV 83.3  --  86.0  --  86.1 85.7  PLT 76*  < > 54*  --  66* 134*  < > = values in this interval not displayed.

## 2015-08-06 NOTE — Progress Notes (Signed)
[facility] TEAM 1 - Stepdown/ICU TEAM PROGRESS NOTE  Karla Garner [id number] DOB: 1986/04/05 DOA: 08/02/2015 PCP: No primary care provider on file.  Admit HPI / Brief Narrative: 29 y.o. female with a history of glomerulonephritis with chronic kidney disease. She has been on and off of dialysis over the past 2 years. As of this June she had been off of dialysis for a year and a half. This June she was hospitalized and was started on hemodialysis again. A right IJ tunneled dialysis catheter was placed. A right arm fistula was created. This was done on June 23. She was in her usual state of health until about 3-4 days prior to admission when she started feeling weak. She developed a fever. She started having nausea, and vomiting with diffuse abdominal pain. She was seen in her dialysis center for her usual session and was found have a fever of 103F. She was dialyzed for 2 hours. She was given 500 milligrams of vancomycin and subsequently was transferred to the emergency department for further management.  HPI/Subjective: Patient denies any chest or shortness of breath, resting comfortably, during hemodialysis  Assessment/Plan:  Enterobacter cloacae bacteremia with Severe Sepsis / lactic acidosis  suspicious for line infection from tunneled HD cath - cath removed 10/13 - continue Zosyn, and follow - will plan for 14 days of abx tx after cath removal . 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  Nephrology following - unable to use AVF 10/14 - Vas Surg to place new HD cath 10/15  Thrombocytopenia Common in gram- rod sepsis - no evidence of active bleeding presently - slowly  improving   Abdom pain w/ nausea and vomiting  CT abdom w/o acute findings - ceased  Normocytic anemia  Due to ESRD +/- component of hemolysis in setting of gram neg rod bacteremia + menstrual loss - follow Hgb - s/p 2U PRBC on 10/12 - holding stable at ~9.0   Code Status: FULL Family Communication: no family present at time of exam Disposition Plan: Patient can be discharged in 2-3 days  Consultants: Nephrology  Vascular surgery  Procedures: 10/13 - hemodialysis catheter removal  Antibiotics: Zosyn 10/11 > Vanc 10/11 > 10/14  DVT prophylaxis: SCDs  Objective: Blood pressure 135/97, pulse 69, temperature 97.8 F (36.6 C), temperature source Oral, resp. rate 20, height 5\' 3"  (1.6 m), weight 42.4 kg (93 lb 7.6 oz), last menstrual period 08/01/2015, SpO2 99 %, unknown if currently breastfeeding.  Intake/Output Summary (Last 24 hours) at 08/06/15 1139 Last data filed at 08/06/15 0601  Gross per 24 hour  Intake    410 ml  Output      0 ml  Net    410 ml   Exam: General: Well developed, thin, in no acute distress. Language barrier Head: Normocephalic, atraumatic, sclera non-icteric, mucus membranes are moist Neck: Supple. JVD not elevated. Lungs: Clear bilaterally to auscultation without wheezes, rales, or rhonchi. Breathing is unlabored. Heart: RRR with S1 S2. No murmurs, rubs, or gallops appreciated. Abdomen: Soft, mild generalized tenderness, non-distended with normoactive bowel sounds. No rebound/guarding. No obvious abdominal masses. M-S: Strength and tone appear normal for age. Lower extremities:without edema or ischemic changes, no open  wounds  Neuro: Alert and oriented X 3. Moves all extremities spontaneously Data Reviewed: Basic Metabolic Panel:  Recent Labs Lab 08/02/15 1453 08/03/15 0220 08/04/15 0325 08/06/15 0600  NA 130* 134* 134* 132*  K 4.0 5.1 4.1 4.2  CL 92* 97* 94* 95*  CO2 21* 22 26 21*  GLUCOSE 101* 102* 98 91  BUN 43* 53* 26* 55*    CREATININE 6.47* 7.57* 5.86* 10.70*  CALCIUM 10.6* 8.6* 8.4* 7.9*  PHOS  --   --   --  6.8*    CBC:  Recent Labs Lab 08/02/15 1453 08/02/15 1917 08/03/15 0220 08/03/15 2106 08/04/15 0325 08/06/15 0600  WBC 9.0  --  7.3  --  8.6 7.8  NEUTROABS 8.2*  --   --   --   --   --   HGB 7.5*  --  5.9* 9.5* 9.1* 9.6*  HCT 21.5*  --  17.8* 27.6* 27.3* 27.6*  MCV 83.3  --  86.0  --  86.1 85.7  PLT 76* 72* 54*  --  66* 134*    Liver Function Tests:  Recent Labs Lab 08/02/15 1453 08/03/15 0220 08/04/15 0325 08/06/15 0600  AST 34 23 36  --   ALT 31 26 28   --   ALKPHOS 72 48 55  --   BILITOT 0.9 0.7 0.8  --   PROT 6.9 5.8* 5.9*  --   ALBUMIN 3.4* 2.5* 2.5* 2.6*    Coags:  Recent Labs Lab 08/02/15 1917 08/03/15 0220  INR 1.26 1.23    Recent Labs Lab 08/02/15 1917 08/03/15 0220  APTT 29 30    Recent Results (from the past 240 hour(s))  Blood Culture (routine x 2)     Status: None   Collection Time: 08/02/15  2:51 PM  Result Value Ref Range Status   Specimen Description BLOOD LEFT ANTECUBITAL  Final   Special Requests BOTTLES DRAWN AEROBIC AND ANAEROBIC 5CC  Final   Culture  Setup Time   Final    GRAM NEGATIVE RODS IN BOTH AEROBIC AND ANAEROBIC BOTTLES CRITICAL RESULT CALLED TO, READ BACK BY AND VERIFIED WITH: T NEILSON @0605  08/03/15 MKELLY    Culture ENTEROBACTER CLOACAE  Final   Report Status 08/05/2015 FINAL  Final   Organism ID, Bacteria ENTEROBACTER CLOACAE  Final      Susceptibility   Enterobacter cloacae - MIC*    CEFAZOLIN >=64 RESISTANT Resistant     CEFEPIME <=1 SENSITIVE Sensitive     CEFTAZIDIME <=1 SENSITIVE Sensitive     CEFTRIAXONE <=1 SENSITIVE Sensitive     CIPROFLOXACIN <=0.25 SENSITIVE Sensitive     GENTAMICIN <=1 SENSITIVE Sensitive     IMIPENEM 0.5 SENSITIVE Sensitive     TRIMETH/SULFA <=20 SENSITIVE Sensitive     PIP/TAZO <=4 SENSITIVE Sensitive     * ENTEROBACTER CLOACAE  Blood Culture (routine x 2)     Status: None   Collection  Time: 08/02/15  2:52 PM  Result Value Ref Range Status   Specimen Description BLOOD LEFT WRIST  Final   Special Requests BOTTLES DRAWN AEROBIC AND ANAEROBIC 5CC  Final   Culture  Setup Time   Final    GRAM NEGATIVE RODS IN BOTH AEROBIC AND ANAEROBIC BOTTLES CRITICAL RESULT CALLED TO, READ BACK BY AND VERIFIED WITH: Tyson Babinski RN [id number] 08/03/15 A BROWNING    Culture   Final    ENTEROBACTER CLOACAE SUSCEPTIBILITIES PERFORMED ON PREVIOUS CULTURE WITHIN THE LAST 5 DAYS.    Report Status 08/05/2015 FINAL  Final  Urine culture     Status: None   Collection Time: 08/02/15  3:30 PM  Result Value Ref Range Status   Specimen Description URINE, RANDOM  Final   Special Requests NONE  Final   Culture MULTIPLE SPECIES PRESENT, SUGGEST RECOLLECTION  Final   Report Status 08/03/2015 FINAL  Final  MRSA PCR Screening     Status: None   Collection Time: 08/03/15 12:38 AM  Result Value Ref Range Status   MRSA by PCR NEGATIVE NEGATIVE Final    Comment:        The GeneXpert MRSA Assay (FDA approved for NASAL specimens only), is one component of a comprehensive MRSA colonization surveillance program. It is not intended to diagnose MRSA infection nor to guide or monitor treatment for MRSA infections.   Cath Tip Culture     Status: None (Preliminary result)   Collection Time: 08/04/15  8:03 AM  Result Value Ref Range Status   Specimen Description HEMODIALYSIS CATHETER  Final   Special Requests NONE  Final   Culture   Final    NO GROWTH 1 DAY Performed at [facility]    Report Status PENDING  Incomplete     Studies:   Recent x-ray studies have been reviewed in detail by the Attending Physician  Scheduled Meds:  Scheduled Meds: . [START ON 08/09/2015] darbepoetin (ARANESP) injection - DIALYSIS  100 mcg Intravenous Q Tue-HD  . doxercalciferol      . doxercalciferol  4 mcg Intravenous Q T,Th,Sa-HD  . multivitamin  1 tablet Oral QHS  . piperacillin-tazobactam (ZOSYN)  IV  2.25 g  Intravenous 3 times per day  . sevelamer carbonate  800 mg Oral TID WC  . sodium chloride  3 mL Intravenous Q12H    Time spent on care of this patient: 25 mins   Reyne Dumas , MD   [facility] Office  [phone] Pager - Text Page per Shea Evans as per below:  On-Call/Text Page:      [email or web address]      password [email or web address]  If 7PM-7AM, please contact night-coverage [email or web address] Password [email or web address] 08/06/2015, 11:39 AM   LOS: 4 days

## 2015-08-06 NOTE — Procedures (Signed)
Patient was seen on dialysis and the procedure was supervised.  BFR 200- using avf first time  Via AVF BP is  163/106.   Patient appears to be tolerating treatment well  Nevin Kozuch A 08/06/2015

## 2015-08-06 NOTE — Progress Notes (Addendum)
ANTIBIOTIC CONSULT NOTE Pharmacy Consult for ceftazidime Indication: bacteremia  No Known Allergies  Patient Measurements: Height: 5\' 3"  (160 cm) Weight: 93 lb 7.6 oz (42.4 kg) IBW/kg (Calculated) : 52.4   Vital Signs: Temp: 97.8 F (36.6 C) (10/15 0830) Temp Source: Oral (10/15 0428) BP: 135/97 mmHg (10/15 1130) Pulse Rate: 69 (10/15 1130) Intake/Output from previous day: 10/14 0701 - 10/15 0700 In: 580 [P.O.:480; IV Piggyback:100] Out: 0  Intake/Output from this shift:    Labs:  Recent Labs  08/03/15 2106 08/04/15 0325 08/06/15 0600  WBC  --  8.6 7.8  HGB 9.5* 9.1* 9.6*  PLT  --  66* 134*  CREATININE  --  5.86* 10.70*   Estimated Creatinine Clearance: 5.3 mL/min (by C-G formula based on Cr of 10.7). No results for input(s): VANCOTROUGH, VANCOPEAK, VANCORANDOM, GENTTROUGH, GENTPEAK, GENTRANDOM, TOBRATROUGH, TOBRAPEAK, TOBRARND, AMIKACINPEAK, AMIKACINTROU, AMIKACIN in the last 72 hours.   Assessment: 57 yoF admitted from dialysis unit with suspected graft site infection.  Pharmacy consulted to change zosyn to ceftazidime x 14 days.   Enterobacter bacteremia. HD Catheter removed.   Zosyn changed to ceftaz 10/15 to end 10/27 per MD order.  Zosyn: 10/11 > 10/15 Vancomycin: 10/11 >10/14 Ceftaz 10/15>>  10/13: Cath tip cx>>ngtd 10/11 blood cx: Enterobacter only R to Ancef 10/11 urine: ngtd 10/11 mrsa: neg   Plan:  ceftaz 2 gm qHD on TTS x 14 days to end 10/27 Pharmacy to sign off  Eudelia Bunch, Pharm.D. [id number] 08/06/2015 11:57 AM

## 2015-08-07 ENCOUNTER — Encounter (HOSPITAL_COMMUNITY)
Admission: EM | Disposition: A | Discharge status: Home / Self Care | Payer: Self-pay | Source: Home / Self Care | Attending: Internal Medicine

## 2015-08-07 DIAGNOSIS — Z992 Dependence on renal dialysis: Secondary | ICD-10-CM

## 2015-08-07 DIAGNOSIS — R1012 Left upper quadrant pain: Secondary | ICD-10-CM

## 2015-08-07 DIAGNOSIS — R7881 Bacteremia: Secondary | ICD-10-CM

## 2015-08-07 DIAGNOSIS — N186 End stage renal disease: Secondary | ICD-10-CM

## 2015-08-07 LAB — CATH TIP CULTURE: CULTURE: NO GROWTH

## 2015-08-07 SURGERY — INSERTION OF DIALYSIS CATHETER
Anesthesia: Monitor Anesthesia Care

## 2015-08-07 MED ORDER — POLYVINYL ALCOHOL 1.4 % OP SOLN
1.0000 [drp] | OPHTHALMIC | Status: DC | PRN
  Administered 2015-08-07: 1 [drp] via OPHTHALMIC
  Filled 2015-08-07: qty 15

## 2015-08-07 MED ORDER — OXYCODONE HCL 5 MG PO TABS: 5.0000 mg | ORAL_TABLET | ORAL | Status: DC | PRN

## 2015-08-07 MED ORDER — DEXTROSE 5 % IV SOLN: 2.0000 g | INTRAVENOUS | Status: AC

## 2015-08-07 MED ORDER — SEVELAMER CARBONATE 800 MG PO TABS: 800.0000 mg | ORAL_TABLET | Freq: Three times a day (TID) | ORAL | Status: DC

## 2015-08-07 MED ORDER — LORATADINE 10 MG PO TABS: 10.0000 mg | ORAL_TABLET | Freq: Every day | ORAL | Status: DC | PRN

## 2015-08-07 NOTE — Discharge Summary (Signed)
Physician Discharge Summary  Karla Garner MRN: [id number] DOB/AGE: 1986/08/03 29 y.o.  PCP: No primary care provider on file.   Admit date: 08/02/2015 Discharge date: 08/07/2015  Discharge Diagnoses:   Principal Problem:   Sepsis [facility]) Active Problems:   Glomerulonephritis   ESRD (end stage renal disease) ([place])   Vaginal spotting   Abdominal pain   Thrombocytopenia (HCC)   Normocytic anemia   Sepsis, unspecified organism ([place])   Bacteremia   Lactic acidosis   Sinus tachycardia ([place])   ESRD on hemodialysis ([place])    Follow-up recommendations Follow-up with PCP in 3-5 days , including all  additional recommended appointments as below Follow-up CBC, CMP in 3-5 days ceftaz 2 gm qHD on TTS x 14 days to end 10/27      Medication List    TAKE these medications        cefTAZidime 2 g in dextrose 5 % 50 mL  Inject 2 g into the vein Every Tuesday,Thursday,and Saturday with dialysis.     loratadine 10 MG tablet  Commonly known as:  CLARITIN  Take 1 tablet (10 mg total) by mouth daily as needed for allergies.     oxyCODONE 5 MG immediate release tablet  Commonly known as:  Oxy IR/ROXICODONE  Take 1 tablet (5 mg total) by mouth every 4 (four) hours as needed for moderate pain.     sevelamer carbonate 800 MG tablet  Commonly known as:  RENVELA  Take 1 tablet (800 mg total) by mouth 3 (three) times daily with meals.         Discharge Condition: *Stable  Disposition: 95-DC/txfr to another health care institution with planned acute care hosp IP readmit   Consults:  Nephrology   Significant Diagnostic Studies:  Ct Abdomen Pelvis Wo Contrast  08/02/2015  CLINICAL DATA:  Possible graft site infection with abdominal pain and sepsis EXAM: CT ABDOMEN AND PELVIS WITHOUT CONTRAST TECHNIQUE: Multidetector CT imaging of the abdomen and pelvis was performed following the standard protocol without IV contrast. COMPARISON:  None. FINDINGS: Bibasilar atelectasis is  noted. No sizable effusion is seen. Diffuse decreased attenuation is noted within the cardiac chambers likely related to underlying anemia. Dialysis catheter tips are noted within the right atrium. The liver, gallbladder, spleen, adrenal glands and pancreas are within normal limits. The kidneys are shrunken consistent with the history of end-stage renal disease. No renal calculi or mass lesions are seen. The bladder is partially distended. No pelvic mass lesion or sidewall abnormality is noted. The appendix is well visualized and within normal limits. No free pelvic fluid is seen. The bony structures show no acute abnormality. IMPRESSION: Bibasilar atelectasis. No acute abnormality is identified within the abdomen and pelvis. Electronically Signed   By: Inez Catalina M.D.   On: 08/02/2015 21:05   Dg Chest Port 1 View  08/02/2015  CLINICAL DATA:  Code sepsis.  ESRD. EXAM: PORTABLE CHEST 1 VIEW COMPARISON:  04/14/2015. FINDINGS: Lungs are clear without infiltrate or effusion. Negative for pneumonia Heart size mildly enlarged. Dialysis catheter tips in the SVC and right atrium unchanged. Vascularity normal. IMPRESSION: No active disease. Electronically Signed   By: Franchot Gallo M.D.   On: 08/02/2015 15:23        Filed Weights   08/05/15 0700 08/05/15 2059 08/06/15 0830  Weight: 41.3 kg (91 lb 0.8 oz) 42.5 kg (93 lb 11.1 oz) 42.4 kg (93 lb 7.6 oz)     Microbiology: Recent Results (from the past 240 hour(s))  Blood  Culture (routine x 2)     Status: None   Collection Time: 08/02/15  2:51 PM  Result Value Ref Range Status   Specimen Description BLOOD LEFT ANTECUBITAL  Final   Special Requests BOTTLES DRAWN AEROBIC AND ANAEROBIC 5CC  Final   Culture  Setup Time   Final    GRAM NEGATIVE RODS IN BOTH AEROBIC AND ANAEROBIC BOTTLES CRITICAL RESULT CALLED TO, READ BACK BY AND VERIFIED WITH: T NEILSON @0605  08/03/15 MKELLY    Culture ENTEROBACTER CLOACAE  Final   Report Status 08/05/2015 FINAL   Final   Organism ID, Bacteria ENTEROBACTER CLOACAE  Final      Susceptibility   Enterobacter cloacae - MIC*    CEFAZOLIN >=64 RESISTANT Resistant     CEFEPIME <=1 SENSITIVE Sensitive     CEFTAZIDIME <=1 SENSITIVE Sensitive     CEFTRIAXONE <=1 SENSITIVE Sensitive     CIPROFLOXACIN <=0.25 SENSITIVE Sensitive     GENTAMICIN <=1 SENSITIVE Sensitive     IMIPENEM 0.5 SENSITIVE Sensitive     TRIMETH/SULFA <=20 SENSITIVE Sensitive     PIP/TAZO <=4 SENSITIVE Sensitive     * ENTEROBACTER CLOACAE  Blood Culture (routine x 2)     Status: None   Collection Time: 08/02/15  2:52 PM  Result Value Ref Range Status   Specimen Description BLOOD LEFT WRIST  Final   Special Requests BOTTLES DRAWN AEROBIC AND ANAEROBIC 5CC  Final   Culture  Setup Time   Final    GRAM NEGATIVE RODS IN BOTH AEROBIC AND ANAEROBIC BOTTLES CRITICAL RESULT CALLED TO, READ BACK BY AND VERIFIED WITH: Tyson Babinski RN [id number] 08/03/15 A BROWNING    Culture   Final    ENTEROBACTER CLOACAE SUSCEPTIBILITIES PERFORMED ON PREVIOUS CULTURE WITHIN THE LAST 5 DAYS.    Report Status 08/05/2015 FINAL  Final  Urine culture     Status: None   Collection Time: 08/02/15  3:30 PM  Result Value Ref Range Status   Specimen Description URINE, RANDOM  Final   Special Requests NONE  Final   Culture MULTIPLE SPECIES PRESENT, SUGGEST RECOLLECTION  Final   Report Status 08/03/2015 FINAL  Final  MRSA PCR Screening     Status: None   Collection Time: 08/03/15 12:38 AM  Result Value Ref Range Status   MRSA by PCR NEGATIVE NEGATIVE Final    Comment:        The GeneXpert MRSA Assay (FDA approved for NASAL specimens only), is one component of a comprehensive MRSA colonization surveillance program. It is not intended to diagnose MRSA infection nor to guide or monitor treatment for MRSA infections.   Cath Tip Culture     Status: None   Collection Time: 08/04/15  8:03 AM  Result Value Ref Range Status   Specimen Description HEMODIALYSIS CATHETER   Final   Special Requests NONE  Final   Culture   Final    NO GROWTH 2 DAYS Performed at [facility]    Report Status 08/07/2015 FINAL  Final  Culture, Urine     Status: None   Collection Time: 08/05/15  8:45 PM  Result Value Ref Range Status   Specimen Description URINE, CLEAN CATCH  Final   Special Requests NONE  Final   Culture NO GROWTH 1 DAY  Final   Report Status 08/06/2015 FINAL  Final       Blood Culture    Component Value Date/Time   SDES URINE, CLEAN CATCH 08/05/2015 2045   [place] NONE 08/05/2015 2045  CULT NO GROWTH 1 DAY 08/05/2015 2045   REPTSTATUS 08/06/2015 FINAL 08/05/2015 2045      Labs: Results for orders placed or performed during the hospital encounter of 08/02/15 (from the past 48 hour(s))  Urinalysis, Routine w reflex microscopic (not at [facility])     Status: Abnormal   Collection Time: 08/05/15  8:45 PM  Result Value Ref Range   Color, Urine YELLOW YELLOW   APPearance CLEAR CLEAR   Specific Gravity, Urine 1.006 1.005 - 1.030   pH 8.0 5.0 - 8.0   Glucose, UA 250 (A) NEGATIVE mg/dL   Hgb urine dipstick LARGE (A) NEGATIVE   Bilirubin Urine NEGATIVE NEGATIVE   Ketones, ur NEGATIVE NEGATIVE mg/dL   Protein, ur 100 (A) NEGATIVE mg/dL   Urobilinogen, UA 0.2 0.0 - 1.0 mg/dL   Nitrite NEGATIVE NEGATIVE   Leukocytes, UA NEGATIVE NEGATIVE  Culture, Urine     Status: None   Collection Time: 08/05/15  8:45 PM  Result Value Ref Range   Specimen Description URINE, CLEAN CATCH    Special Requests NONE    Culture NO GROWTH 1 DAY    Report Status 08/06/2015 FINAL   Urine microscopic-add on     Status: Abnormal   Collection Time: 08/05/15  8:45 PM  Result Value Ref Range   Squamous Epithelial / LPF FEW (A) RARE   WBC, UA 0-2 <3 WBC/hpf   RBC / HPF 0-2 <3 RBC/hpf   Bacteria, UA RARE RARE  Renal function panel     Status: Abnormal   Collection Time: 08/06/15  6:00 AM  Result Value Ref Range   Sodium 132 (L) 135 - 145 mmol/L   Potassium 4.2  3.5 - 5.1 mmol/L   Chloride 95 (L) 101 - 111 mmol/L   CO2 21 (L) 22 - 32 mmol/L   Glucose, Bld 91 65 - 99 mg/dL   BUN 55 (H) 6 - 20 mg/dL   Creatinine, Ser 10.70 (H) 0.44 - 1.00 mg/dL   Calcium 7.9 (L) 8.9 - 10.3 mg/dL   Phosphorus 6.8 (H) 2.5 - 4.6 mg/dL   Albumin 2.6 (L) 3.5 - 5.0 g/dL   GFR calc non Af Amer 4 (L) >60 mL/min   GFR calc Af Amer 5 (L) >60 mL/min    Comment: (NOTE) The eGFR has been calculated using the CKD EPI equation. This calculation has not been validated in all clinical situations. eGFR's persistently <60 mL/min signify possible Chronic Kidney Disease.    Anion gap 16 (H) 5 - 15  Iron and TIBC     Status: Abnormal   Collection Time: 08/06/15  6:00 AM  Result Value Ref Range   Iron 170 28 - 170 ug/dL   TIBC 203 (L) 250 - 450 ug/dL   Saturation Ratios 84 (H) 10.4 - 31.8 %   UIBC 33 ug/dL  Ferritin     Status: Abnormal   Collection Time: 08/06/15  6:00 AM  Result Value Ref Range   Ferritin 737 (H) 11 - 307 ng/mL  CBC     Status: Abnormal   Collection Time: 08/06/15  6:00 AM  Result Value Ref Range   WBC 7.8 4.0 - 10.5 K/uL   RBC 3.22 (L) 3.87 - 5.11 MIL/uL   Hemoglobin 9.6 (L) 12.0 - 15.0 g/dL   HCT 27.6 (L) 36.0 - 46.0 %   MCV 85.7 78.0 - 100.0 fL   MCH 29.8 26.0 - 34.0 pg   MCHC 34.8 30.0 - 36.0 g/dL   RDW  13.1 11.5 - 15.5 %   Platelets 134 (L) 150 - 400 K/uL     Lipid Panel  No results found for: CHOL, TRIG, HDL, CHOLHDL, VLDL, LDLCALC, LDLDIRECT   No results found for: HGBA1C   Lab Results  Component Value Date   CREATININE 10.70* 08/06/2015     HPI :29 y.o. female with a history of glomerulonephritis with chronic kidney disease. She has been on and off of dialysis over the past 2 years. As of this June she had been off of dialysis for a year and a half. This June she was hospitalized and was started on hemodialysis again. A right IJ tunneled dialysis catheter was placed. A right arm fistula was created. This was done on June 23. She was  in her usual state of health until about 3-4 days prior to admission when she started feeling weak. She developed a fever. She started having nausea, and vomiting with diffuse abdominal pain. She was seen in her dialysis center for her usual session and was found have a fever of 103F. She was dialyzed for 2 hours. She was given 500 milligrams of vancomycin and subsequently was transferred to the emergency department for further management.  HOSPITAL COURSE:    Enterobacter cloacae bacteremia with Severe Sepsis / lactic acidosis  suspicious for line infection from tunneled HD cath - cath removed 10/13 - continue Zosyn, and follow - will plan for 14 days of abx tx after cath removal . Can switch to [facility] for pharmacy, 2 weeks from 10/13  Sinus tachycardia Appeared to be physiologic response to very high temperature - resolved   +UA Unclear significance in this pt who is ESRD - urine culture not helpful - should be appropriately cover with current empiric antibiotic if a true pathogen exists  ESRD Tu/Th/Sat due to glomerulonephritis  Nephrology following - unable to use AVF 10/14 - Vas Surg to place new HD cath 10/15  Thrombocytopenia Common in gram- rod sepsis - no evidence of active bleeding presently - slowly improving , 134 pack to discharge  Abdom pain w/ nausea and vomiting  CT abdom w/o acute findings - ceased  Normocytic anemia  Due to ESRD +/- component of hemolysis in setting of gram neg rod bacteremia + menstrual loss - follow Hgb - s/p 2U PRBC on 10/12 - holding stable at ~9.6 prior to discharge   Discharge Exam:  Blood pressure 147/94, pulse 84, temperature 98.4 F (36.9 C), temperature source Oral, resp. rate 17, height 5' 3"  (1.6 m), weight 42.4 kg (93 lb 7.6 oz), last menstrual period 08/01/2015, SpO2 99 %, unknown if currently breastfeeding.  General: Well developed, thin, in no acute distress. Language barrier Head: Normocephalic, atraumatic, sclera non-icteric,  mucus membranes are moist Neck: Supple. JVD not elevated. Lungs: Clear bilaterally to auscultation without wheezes, rales, or rhonchi. Breathing is unlabored. Heart: RRR with S1 S2. No murmurs, rubs, or gallops appreciated. Abdomen: Soft, mild generalized tenderness, non-distended with normoactive bowel sounds. No rebound/guarding. No obvious abdominal masses. M-S: Strength and tone appear normal for age. Lower extremities:without edema or ischemic changes, no open wounds  Neuro: Alert and oriented X 3. Moves all extremities spontaneously       Discharge Instructions    Diet - low sodium heart healthy    Complete by:  As directed      Increase activity slowly    Complete by:  As directed            Follow-up Information  Follow up with Primary care provider. Schedule an appointment as soon as possible for a visit in 3 days.      SignedReyne Dumas 08/07/2015, 8:06 AM        Time spent >45 mins

## 2015-08-07 NOTE — Progress Notes (Signed)
Patient Discharge:  Disposition: Pt discharged home with husband.   Education: Pt educated on medications, follow up appointments and all discharge instructions. RN used telephone interpreter and printed AVS and handouts in spanish for teaching. Pt and spouse verbalized understanding.   IV: Removed.  Telemetry: N/A  Follow-up appointments:Reviewed with pt.   Prescriptions: Scripts given to pt.   Transportation: Transported home by husband.   Belongings: All belongings taken with pt.

## 2015-12-22 DIAGNOSIS — E8779 Other fluid overload: Secondary | ICD-10-CM | POA: Insufficient documentation

## 2016-01-05 ENCOUNTER — Other Ambulatory Visit: Payer: Self-pay | Admitting: Radiology

## 2016-01-05 ENCOUNTER — Emergency Department (HOSPITAL_COMMUNITY)
Admission: EM | Admit: 2016-01-05 | Discharge: 2016-01-05 | Disposition: A | Discharge status: Home / Self Care | Payer: MEDICAID | Attending: Emergency Medicine | Admitting: Emergency Medicine

## 2016-01-05 ENCOUNTER — Encounter (HOSPITAL_COMMUNITY): Payer: Self-pay | Admitting: *Deleted

## 2016-01-05 ENCOUNTER — Other Ambulatory Visit (HOSPITAL_COMMUNITY): Payer: Self-pay | Admitting: Nephrology

## 2016-01-05 DIAGNOSIS — Z79899 Other long term (current) drug therapy: Secondary | ICD-10-CM | POA: Insufficient documentation

## 2016-01-05 DIAGNOSIS — I120 Hypertensive chronic kidney disease with stage 5 chronic kidney disease or end stage renal disease: Secondary | ICD-10-CM | POA: Insufficient documentation

## 2016-01-05 DIAGNOSIS — R2232 Localized swelling, mass and lump, left upper limb: Secondary | ICD-10-CM | POA: Insufficient documentation

## 2016-01-05 DIAGNOSIS — Z940 Kidney transplant status: Secondary | ICD-10-CM | POA: Insufficient documentation

## 2016-01-05 DIAGNOSIS — N185 Chronic kidney disease, stage 5: Secondary | ICD-10-CM | POA: Insufficient documentation

## 2016-01-05 DIAGNOSIS — Z862 Personal history of diseases of the blood and blood-forming organs and certain disorders involving the immune mechanism: Secondary | ICD-10-CM | POA: Insufficient documentation

## 2016-01-05 DIAGNOSIS — T8249XA Other complication of vascular dialysis catheter, initial encounter: Secondary | ICD-10-CM

## 2016-01-05 DIAGNOSIS — N19 Unspecified kidney failure: Secondary | ICD-10-CM

## 2016-01-05 LAB — I-STAT CHEM 8, ED
BUN: 67 mg/dL — ABNORMAL HIGH (ref 6–20)
Calcium, Ion: 1.2 mmol/L (ref 1.12–1.23)
Chloride: 106 mmol/L (ref 101–111)
Creatinine, Ser: 10.4 mg/dL — ABNORMAL HIGH (ref 0.44–1.00)
Glucose, Bld: 84 mg/dL (ref 65–99)
HEMATOCRIT: 49 % — AB (ref 36.0–46.0)
Hemoglobin: 16.7 g/dL — ABNORMAL HIGH (ref 12.0–15.0)
Potassium: 6 mmol/L — ABNORMAL HIGH (ref 3.5–5.1)
SODIUM: 136 mmol/L (ref 135–145)
TCO2: 20 mmol/L (ref 0–100)

## 2016-01-05 MED ORDER — SODIUM POLYSTYRENE SULFONATE 15 GM/60ML PO SUSP
30.0000 g | Freq: Once | ORAL | Status: AC
  Administered 2016-01-05: 30 g via ORAL
  Filled 2016-01-05: qty 120

## 2016-01-05 NOTE — Discharge Instructions (Signed)
You need to come to the hospital radiology department tomorrow Friday 8 AM. Do not eat or drink anything after midnight tonight. After you get your graft fixed you need to go to dialysis by 12:30pm tomorrow

## 2016-01-05 NOTE — ED Provider Notes (Addendum)
CSN: [id number]     Arrival date & time 01/05/16  [id number] History   First MD Initiated Contact with Patient 01/05/16 [id number]     Chief Complaint  Patient presents with  . Vascular Access Problem     (Consider location/radiation/quality/duration/timing/severity/associated sxs/prior Treatment) Patient is a 30 y.o. female presenting with arm injury. The history is provided by the patient (Patient sent over from dialysis because her graft in her left arm is swollen tender and clotted. Patient has a history of renal failure).  Arm Injury Upper extremity pain location: Left forearm. Pain details:    Quality:  Aching   Radiates to:  Does not radiate   Severity:  Moderate   Onset quality:  Gradual   Timing:  Constant   Progression:  Worsening Associated symptoms: no back pain and no fatigue     Past Medical History  Diagnosis Date  . Thrombocytopenia ([place]) 2014  . PONV (postoperative nausea and vomiting)     was on dialysis i during pregnancy2014  . Hypertension   . Hemodialysis patient ([place])     Tu, Th, Sat  . Glomerulonephritis   . CKD (chronic kidney disease), stage V [facility])    Past Surgical History  Procedure Laterality Date  . Cesarean section      2009  . Tubal ligation  2014  . Av fistula placement Left   . Ligation of arteriovenous  fistula Left 11/05/2014    Procedure: LIGATION OF LEFT ARM  BRACHIO-CEPHALIC ARTERIOVENOUS  FISTULA ,& REPAIR OF BRACHIAL ARTERY.;  Surgeon: Mal [name], MD;  Location: [place];  Service: Vascular;  Laterality: Left;  . Av fistula placement Right 04/14/2015    Procedure: CREATION OF RIGHT RADIOCEPHALIC ARTERIOVENOUS (AV) FISTULA ;  Surgeon: Angelia Mould, MD;  Location: [place];  Service: Vascular;  Laterality: Right;  . Exchange of a dialysis catheter Right 04/14/2015    Procedure: EXCHANGE OF RIGHT INTERNAL JUGULAR DIALYSIS CATHETER;  Surgeon: Angelia Mould, MD;  Location: [place];  Service: Vascular;  Laterality: Right;   History  reviewed. No pertinent family history. Social History  Substance Use Topics  . Smoking status: Never Smoker   . Smokeless tobacco: Never Used  . Alcohol Use: No   OB History    Gravida Para Term Preterm AB TAB SAB Ectopic Multiple Living   3 3 1 2      2      Review of Systems  Constitutional: Negative for appetite change and fatigue.  HENT: Negative for congestion, ear discharge and sinus pressure.   Eyes: Negative for discharge.  Respiratory: Negative for cough.   Cardiovascular: Negative for chest pain.  Gastrointestinal: Negative for abdominal pain and diarrhea.  Genitourinary: Negative for frequency and hematuria.  Musculoskeletal: Negative for back pain.       Right arm swollen and tender. Patient has a dialysis graft there that is clotted  Skin: Negative for rash.  Neurological: Negative for seizures and headaches.  Psychiatric/Behavioral: Negative for hallucinations.      Allergies  Review of patient's allergies indicates no known allergies.  Home Medications   Prior to Admission medications   Medication Sig Start Date End Date Taking? Authorizing Provider  loratadine (CLARITIN) 10 MG tablet Take 1 tablet (10 mg total) by mouth daily as needed for allergies. 08/07/15   Reyne Dumas, MD  oxyCODONE (OXY IR/ROXICODONE) 5 MG immediate release tablet Take 1 tablet (5 mg total) by mouth every 4 (four) hours as needed for moderate pain. 08/07/15  Reyne Dumas, MD  sevelamer carbonate (RENVELA) 800 MG tablet Take 1 tablet (800 mg total) by mouth 3 (three) times daily with meals. 08/07/15   Reyne Dumas, MD   BP 107/73 mmHg  Pulse 92  Temp(Src) 98.4 F (36.9 C) (Oral)  Resp 14  Ht 5' (1.524 m)  SpO2 98% Physical Exam  Constitutional: She is oriented to person, place, and time. She appears well-developed.  HENT:  Head: Normocephalic.  Eyes: Conjunctivae and EOM are normal. No scleral icterus.  Neck: Neck supple. No thyromegaly present.  Cardiovascular: Normal rate  and regular rhythm.  Exam reveals no gallop and no friction rub.   No murmur heard. Pulmonary/Chest: No stridor. She has no wheezes. She has no rales. She exhibits no tenderness.  Abdominal: She exhibits no distension. There is no tenderness. There is no rebound.  Musculoskeletal: Normal range of motion. She exhibits edema.  Swollen tender right forearm with clotted dialysis graft  Lymphadenopathy:    She has no cervical adenopathy.  Neurological: She is oriented to person, place, and time. She exhibits normal muscle tone. Coordination normal.  Skin: No rash noted. No erythema.  Psychiatric: She has a normal mood and affect. Her behavior is normal.    ED Course  Procedures (including critical care time) Labs Review Labs Reviewed  I-STAT CHEM 8, ED - Abnormal; Notable for the following:    Potassium 6.0 (*)    BUN 67 (*)    Creatinine, Ser 10.40 (*)    Hemoglobin 16.7 (*)    HCT 49.0 (*)    All other components within normal limits    Imaging Review No results found. I have personally reviewed and evaluated these images and lab results as part of my medical decision-making.   EKG Interpretation   Date/Time:  Thursday January 05 2016 12:02:21 EDT Ventricular Rate:  87 PR Interval:  138 QRS Duration: 94 QT Interval:  376 QTC Calculation: 452 R Axis:   78 Text Interpretation:  Sinus rhythm Confirmed by Rondia Higginbotham  MD, Coleta Grosshans [phone])  on 01/05/2016 12:17:48 PM      MDM   Final diagnoses:  Kidney failure    Patient with renal failure and potassium 6 and clotted dialysis graft on the right. We are unable to arrange getting her dialysis graft unclotted today. I spoke with Dr. Florene Glen the renal doctor and he arranged for her to get dialysis tomorrow at 12:30. She will get her dialysis graft fixed in radiology tomorrow morning. She will then go to dialysis in the afternoon  It was noted  the patient had a potassium of 6 with no EKG changes. I spoke with the renal doctor who  suggested Kayexalate now dialysis tomorrow  Milton Ferguson, MD 01/05/16 1303  Milton Ferguson, MD 01/05/16 1316

## 2016-01-05 NOTE — ED Notes (Addendum)
PT sent to ED from [facility] after Staff was unable to access for dialysis . Graft reported to be clotted and Pt reported pain at site.

## 2016-01-05 NOTE — ED Notes (Signed)
NAD At this time. Pt is stable and going home.

## 2016-01-06 ENCOUNTER — Other Ambulatory Visit (HOSPITAL_COMMUNITY): Payer: Self-pay | Admitting: Nephrology

## 2016-01-06 ENCOUNTER — Encounter (HOSPITAL_COMMUNITY): Payer: Self-pay

## 2016-01-06 ENCOUNTER — Ambulatory Visit (HOSPITAL_COMMUNITY)
Admission: RE | Admit: 2016-01-06 | Discharge: 2016-01-06 | Disposition: A | Discharge status: Home / Self Care | Payer: Self-pay | Source: Ambulatory Visit | Attending: Nephrology | Admitting: Nephrology

## 2016-01-06 DIAGNOSIS — T8249XA Other complication of vascular dialysis catheter, initial encounter: Secondary | ICD-10-CM

## 2016-01-06 DIAGNOSIS — I120 Hypertensive chronic kidney disease with stage 5 chronic kidney disease or end stage renal disease: Secondary | ICD-10-CM | POA: Insufficient documentation

## 2016-01-06 DIAGNOSIS — Z992 Dependence on renal dialysis: Secondary | ICD-10-CM | POA: Insufficient documentation

## 2016-01-06 DIAGNOSIS — N186 End stage renal disease: Secondary | ICD-10-CM | POA: Insufficient documentation

## 2016-01-06 DIAGNOSIS — Y832 Surgical operation with anastomosis, bypass or graft as the cause of abnormal reaction of the patient, or of later complication, without mention of misadventure at the time of the procedure: Secondary | ICD-10-CM | POA: Insufficient documentation

## 2016-01-06 DIAGNOSIS — Z79899 Other long term (current) drug therapy: Secondary | ICD-10-CM | POA: Insufficient documentation

## 2016-01-06 DIAGNOSIS — T82898A Other specified complication of vascular prosthetic devices, implants and grafts, initial encounter: Secondary | ICD-10-CM | POA: Insufficient documentation

## 2016-01-06 MED ORDER — FENTANYL CITRATE (PF) 100 MCG/2ML IJ SOLN
INTRAMUSCULAR | Status: AC | PRN
  Administered 2016-01-06 (×4): 25 ug via INTRAVENOUS

## 2016-01-06 MED ORDER — MIDAZOLAM HCL 2 MG/2ML IJ SOLN
INTRAMUSCULAR | Status: AC
  Filled 2016-01-06: qty 4

## 2016-01-06 MED ORDER — IOHEXOL 300 MG/ML  SOLN
100.0000 mL | Freq: Once | INTRAMUSCULAR | Status: AC | PRN
  Administered 2016-01-06: 50 mL via INTRAVENOUS

## 2016-01-06 MED ORDER — LIDOCAINE HCL 1 % IJ SOLN
INTRAMUSCULAR | Status: AC
  Filled 2016-01-06: qty 20

## 2016-01-06 MED ORDER — FENTANYL CITRATE (PF) 100 MCG/2ML IJ SOLN
INTRAMUSCULAR | Status: AC
  Filled 2016-01-06: qty 4

## 2016-01-06 MED ORDER — PROCHLORPERAZINE EDISYLATE 5 MG/ML IJ SOLN
10.0000 mg | Freq: Four times a day (QID) | INTRAMUSCULAR | Status: DC | PRN
  Filled 2016-01-06: qty 2

## 2016-01-06 MED ORDER — ALTEPLASE 100 MG IV SOLR
4.0000 mg | Freq: Once | INTRAVENOUS | Status: DC
Start: 2016-01-06 — End: 2016-01-07
  Filled 2016-01-06: qty 4

## 2016-01-06 MED ORDER — OXYCODONE-ACETAMINOPHEN 5-325 MG PO TABS
ORAL_TABLET | ORAL | Status: AC
  Administered 2016-01-06: 1 via ORAL
  Filled 2016-01-06: qty 1

## 2016-01-06 MED ORDER — MIDAZOLAM HCL 2 MG/2ML IJ SOLN
INTRAMUSCULAR | Status: AC | PRN
  Administered 2016-01-06 (×4): 0.5 mg via INTRAVENOUS

## 2016-01-06 MED ORDER — OXYCODONE-ACETAMINOPHEN 5-325 MG PO TABS
1.0000 | ORAL_TABLET | Freq: Once | ORAL | Status: AC
  Administered 2016-01-06: 1 via ORAL
  Filled 2016-01-06: qty 1

## 2016-01-06 NOTE — H&P (Signed)
Chief Complaint: Patient was seen in consultation today for  at the request of [place]  Referring Physician(s): Coladonato,Joseph  Supervising Physician: Arne Cleveland  History of Present Illness: Karla Garner is a 30 y.o. female with ESRD. She has been using a (R)forearm AVF.  No issues until yesterday. She says they tried to access it but couldn't get any blood flow. It became painful. They felt it was clotted and she was referred for declot procedure today. Has been NPO. Has never had declot procedure. Came by taxi, but states family is picking her up.  Past Medical History  Diagnosis Date  . Thrombocytopenia ([place]) 2014  . PONV (postoperative nausea and vomiting)     was on dialysis i during pregnancy2014  . Hypertension   . Hemodialysis patient ([place])     Tu, Th, Sat  . Glomerulonephritis   . CKD (chronic kidney disease), stage V [facility])     Past Surgical History  Procedure Laterality Date  . Cesarean section      2009  . Tubal ligation  2014  . Av fistula placement Left   . Ligation of arteriovenous  fistula Left 11/05/2014    Procedure: LIGATION OF LEFT ARM  BRACHIO-CEPHALIC ARTERIOVENOUS  FISTULA ,& REPAIR OF BRACHIAL ARTERY.;  Surgeon: Mal [name], MD;  Location: [place];  Service: Vascular;  Laterality: Left;  . Av fistula placement Right 04/14/2015    Procedure: CREATION OF RIGHT RADIOCEPHALIC ARTERIOVENOUS (AV) FISTULA ;  Surgeon: Angelia Mould, MD;  Location: [place];  Service: Vascular;  Laterality: Right;  . Exchange of a dialysis catheter Right 04/14/2015    Procedure: EXCHANGE OF RIGHT INTERNAL JUGULAR DIALYSIS CATHETER;  Surgeon: Angelia Mould, MD;  Location: [place];  Service: Vascular;  Laterality: Right;    Allergies: Review of patient's allergies indicates no known allergies.  Medications: Prior to Admission medications   Medication Sig Start Date End Date Taking? Authorizing Provider  loratadine (CLARITIN) 10  MG tablet Take 1 tablet (10 mg total) by mouth daily as needed for allergies. 08/07/15  Yes Reyne Dumas, MD  oxyCODONE (OXY IR/ROXICODONE) 5 MG immediate release tablet Take 1 tablet (5 mg total) by mouth every 4 (four) hours as needed for moderate pain. 08/07/15  Yes Reyne Dumas, MD  sevelamer carbonate (RENVELA) 800 MG tablet Take 1 tablet (800 mg total) by mouth 3 (three) times daily with meals. 08/07/15  Yes Reyne Dumas, MD     History reviewed. No pertinent family history.  Social History   Social History  . Marital Status: Single    Spouse Name: N/A  . Number of Children: N/A  . Years of Education: N/A   Social History Main Topics  . Smoking status: Never Smoker   . Smokeless tobacco: Never Used  . Alcohol Use: No  . Drug Use: No  . Sexual Activity: Not Asked   Other Topics Concern  . None   Social History Narrative     Review of Systems: A 12 point ROS discussed and pertinent positives are indicated in the HPI above.  All other systems are negative.  Review of Systems  Vital Signs: BP 113/100 mmHg  Pulse 92  Resp 12  SpO2 100%  Physical Exam  Constitutional: She is oriented to person, place, and time. She appears well-developed and well-nourished. No distress.  HENT:  Head: Normocephalic.  Pulmonary/Chest: Effort normal and breath sounds normal. No respiratory distress.  Musculoskeletal:  (R)forearm AVF is palpable, good distal thrill/bruit.  Audible bruit proximally to antecubital fossa.  Neurological: She is alert and oriented to person, place, and time.  Skin: Skin is warm and dry.  Psychiatric: She has a normal mood and affect. Judgment normal.    Mallampati Score:  MD Evaluation Airway: WNL Heart: WNL Abdomen: WNL Chest/ Lungs: WNL ASA  Classification: 3 Mallampati/Airway Score: One  Imaging: No results found.  Labs:  CBC:  Recent Labs  08/02/15 1453 08/02/15 1917 08/03/15 0220 08/03/15 2106 08/04/15 0325 08/06/15 0600  01/05/16 1012  WBC 9.0  --  7.3  --  8.6 7.8  --   HGB 7.5*  --  5.9* 9.5* 9.1* 9.6* 16.7*  HCT 21.5*  --  17.8* 27.6* 27.3* 27.6* 49.0*  PLT 76* 72* 54*  --  66* 134*  --     COAGS:  Recent Labs  04/13/15 0628 06/24/15 1643 08/02/15 1917 08/03/15 0220  INR 1.03 1.06 1.26 1.23  APTT  --   --  29 30    BMP:  Recent Labs  08/02/15 1453 08/03/15 0220 08/04/15 0325 08/06/15 0600 01/05/16 1012  NA 130* 134* 134* 132* 136  K 4.0 5.1 4.1 4.2 6.0*  CL 92* 97* 94* 95* 106  CO2 21* 22 26 21*  --   GLUCOSE 101* 102* 98 91 84  BUN 43* 53* 26* 55* 67*  CALCIUM 10.6* 8.6* 8.4* 7.9*  --   CREATININE 6.47* 7.57* 5.86* 10.70* 10.40*  GFRNONAA 8* 7* 9* 4*  --   GFRAA 9* 8* 10* 5*  --     LIVER FUNCTION TESTS:  Recent Labs  06/24/15 1643  08/02/15 1453 08/03/15 0220 08/04/15 0325 08/06/15 0600  BILITOT 0.6  --  0.9 0.7 0.8  --   AST 21  --  34 23 36  --   ALT 14  --  31 26 28   --   ALKPHOS 118  --  72 48 55  --   PROT 8.4*  --  6.9 5.8* 5.9*  --   ALBUMIN 4.7  < > 3.4* 2.5* 2.5* 2.6*  < > = values in this interval not displayed.  TUMOR MARKERS: No results for input(s): AFPTM, CEA, CA199, CHROMGRNA in the last 8760 hours.  Assessment and Plan: Possible partially thrombosed (R)AVF For fistulogram and angioplasty if necessary Labs reviewed. Discussed thrombolysis/thrombectomy of AV Graft/Fistula, possible angioplasty, possible stent, possible HD catheter placement if necessary. Risks, benefits, use of sedation thoroughly explained.   Thank you for this interesting consult.  I greatly enjoyed meeting Karla Garner and look forward to participating in their care.  A copy of this report was sent to the requesting provider on this date.  Electronically Signed: Ascencion Dike 01/06/2016, 8:51 AM   I spent a total of 20 minutes in face to face in clinical consultation, greater than 50% of which was counseling/coordinating care for dysfunctional  AVF

## 2016-01-07 DIAGNOSIS — Z4802 Encounter for removal of sutures: Secondary | ICD-10-CM | POA: Insufficient documentation

## 2016-03-26 ENCOUNTER — Emergency Department (HOSPITAL_COMMUNITY): Payer: Self-pay

## 2016-03-26 ENCOUNTER — Encounter (HOSPITAL_COMMUNITY): Payer: Self-pay | Admitting: Emergency Medicine

## 2016-03-26 ENCOUNTER — Emergency Department (HOSPITAL_COMMUNITY)
Admission: EM | Admit: 2016-03-26 | Discharge: 2016-03-26 | Disposition: A | Discharge status: Home / Self Care | Payer: Self-pay | Attending: Emergency Medicine | Admitting: Emergency Medicine

## 2016-03-26 DIAGNOSIS — R6889 Other general symptoms and signs: Secondary | ICD-10-CM

## 2016-03-26 DIAGNOSIS — Z862 Personal history of diseases of the blood and blood-forming organs and certain disorders involving the immune mechanism: Secondary | ICD-10-CM | POA: Insufficient documentation

## 2016-03-26 DIAGNOSIS — R0989 Other specified symptoms and signs involving the circulatory and respiratory systems: Secondary | ICD-10-CM

## 2016-03-26 DIAGNOSIS — Z3202 Encounter for pregnancy test, result negative: Secondary | ICD-10-CM | POA: Insufficient documentation

## 2016-03-26 DIAGNOSIS — N186 End stage renal disease: Secondary | ICD-10-CM | POA: Insufficient documentation

## 2016-03-26 DIAGNOSIS — I120 Hypertensive chronic kidney disease with stage 5 chronic kidney disease or end stage renal disease: Secondary | ICD-10-CM | POA: Insufficient documentation

## 2016-03-26 DIAGNOSIS — R000 Tachycardia, unspecified: Secondary | ICD-10-CM | POA: Insufficient documentation

## 2016-03-26 DIAGNOSIS — J392 Other diseases of pharynx: Secondary | ICD-10-CM | POA: Insufficient documentation

## 2016-03-26 DIAGNOSIS — R0602 Shortness of breath: Secondary | ICD-10-CM | POA: Insufficient documentation

## 2016-03-26 DIAGNOSIS — Z79899 Other long term (current) drug therapy: Secondary | ICD-10-CM | POA: Insufficient documentation

## 2016-03-26 DIAGNOSIS — Z992 Dependence on renal dialysis: Secondary | ICD-10-CM | POA: Insufficient documentation

## 2016-03-26 LAB — CBC WITH DIFFERENTIAL/PLATELET
BASOS ABS: 0 10*3/uL (ref 0.0–0.1)
Basophils Relative: 0 %
EOS ABS: 0.2 10*3/uL (ref 0.0–0.7)
EOS PCT: 2 %
HCT: 31.9 % — ABNORMAL LOW (ref 36.0–46.0)
Hemoglobin: 10.2 g/dL — ABNORMAL LOW (ref 12.0–15.0)
LYMPHS PCT: 20 %
Lymphs Abs: 1.7 10*3/uL (ref 0.7–4.0)
MCH: 27.7 pg (ref 26.0–34.0)
MCHC: 32 g/dL (ref 30.0–36.0)
MCV: 86.7 fL (ref 78.0–100.0)
Monocytes Absolute: 0.8 10*3/uL (ref 0.1–1.0)
Monocytes Relative: 9 %
Neutro Abs: 5.9 10*3/uL (ref 1.7–7.7)
Neutrophils Relative %: 69 %
PLATELETS: 211 10*3/uL (ref 150–400)
RBC: 3.68 MIL/uL — AB (ref 3.87–5.11)
RDW: 17.4 % — ABNORMAL HIGH (ref 11.5–15.5)
WBC: 8.5 10*3/uL (ref 4.0–10.5)

## 2016-03-26 LAB — BASIC METABOLIC PANEL
ANION GAP: 14 (ref 5–15)
BUN: 44 mg/dL — AB (ref 6–20)
CO2: 20 mmol/L — ABNORMAL LOW (ref 22–32)
CREATININE: 10.51 mg/dL — AB (ref 0.44–1.00)
Calcium: 9.9 mg/dL (ref 8.9–10.3)
Chloride: 102 mmol/L (ref 101–111)
GFR calc non Af Amer: 4 mL/min — ABNORMAL LOW (ref >60)
GFR, EST AFRICAN AMERICAN: 5 mL/min — AB (ref >60)
Glucose, Bld: 100 mg/dL — ABNORMAL HIGH (ref 65–99)
POTASSIUM: 5.2 mmol/L — AB (ref 3.5–5.1)
SODIUM: 136 mmol/L (ref 135–145)

## 2016-03-26 LAB — POC OCCULT BLOOD, ED: Fecal Occult Bld: NEGATIVE

## 2016-03-26 LAB — URINE MICROSCOPIC-ADD ON

## 2016-03-26 LAB — POC URINE PREG, ED: PREG TEST UR: NEGATIVE

## 2016-03-26 LAB — URINALYSIS, ROUTINE W REFLEX MICROSCOPIC
Bilirubin Urine: NEGATIVE
Glucose, UA: 250 mg/dL — AB
Ketones, ur: NEGATIVE mg/dL
Nitrite: NEGATIVE
SPECIFIC GRAVITY, URINE: 1.012 (ref 1.005–1.030)
pH: 8 (ref 5.0–8.0)

## 2016-03-26 LAB — RAPID STREP SCREEN (MED CTR MEBANE ONLY): STREPTOCOCCUS, GROUP A SCREEN (DIRECT): NEGATIVE

## 2016-03-26 LAB — MAGNESIUM: MAGNESIUM: 3 mg/dL — AB (ref 1.7–2.4)

## 2016-03-26 MED ORDER — IOPAMIDOL (ISOVUE-370) INJECTION 76%
INTRAVENOUS | Status: AC
  Administered 2016-03-26: 80 mL
  Filled 2016-03-26: qty 100

## 2016-03-26 NOTE — ED Provider Notes (Signed)
MSE was initiated and I personally evaluated the patient and placed orders (if any) at  11:05 AM on March 26, 2016. Interpreter phone used.   Karla Garner is a 30 y.o. female with a PMHx significant of HTN and Stage V CKD who presents to the Emergency Department complaining of a gradual onset, gradually worsening, constant shortness of breath and pressure in her throat onset 3 days ago. She reports that when she lowers her head that her neck and throat begins to "swell". Pt has associated sore throat, edema to her throat, SOB upon exertion. She is still currently on dialysis for her CKD, and her last session was 3 days ago and was insignificant of any problems. Pt denies an fever, rhinorrhea, chest pain.   No obvious infection or fluid overload on exam. Given SOB symptoms in ESRD patient who is tachycardic -- will move to acute ED for further evaluation.   Exam:  Gen NAD; ENT oropharynx clear; Heart tachycardia, nml S1,S2, no m/r/g; Lungs CTAB; Abd soft, NT, no rebound or guarding; Ext 2+ pedal pulses bilaterally, no edema.  BP 143/99 mmHg  Pulse 115  Temp(Src) 98.8 F (37.1 C) (Oral)  Resp 16  SpO2 100%  The patient appears stable so that the remainder of the MSE may be completed by another provider.  I personally performed the services described in this documentation, which was scribed in my presence. The recorded information has been reviewed and is accurate.   Carlisle Cater, PA-C 03/26/16 1110  Leo Grosser, MD 03/27/16 [phone]

## 2016-03-26 NOTE — ED Notes (Signed)
Sore throat x 3 days

## 2016-03-26 NOTE — Discharge Instructions (Signed)
You have been seen today for shortness of breath and throat tightness. Your imaging and lab tests showed no acute abnormalities. Follow up with PCP as soon as possible for reevaluation and chronic management. Return to the emergency department should symptoms worsen.  Se le ha visto hoy por falta de aire y garganta. Su imagen y las pruebas de laboratorio no mostraron anomalas agudas. Seguimiento con PCP tan pronto como sea posible para reevaluacin y manejo crnico. Vuelva al departamento de emergencia si los sntomas empeoran.

## 2016-03-26 NOTE — ED Notes (Signed)
Patient transported to CT 

## 2016-03-26 NOTE — Progress Notes (Signed)
Interpreter Lesle Chris for DR Ashok Cordia

## 2016-03-26 NOTE — ED Provider Notes (Signed)
CSN: [id number]     Arrival date & time 03/26/16  1029 History   First MD Initiated Contact with Patient 03/26/16 1049     Chief Complaint  Patient presents with  . Sore Throat     (Consider location/radiation/quality/duration/timing/severity/associated sxs/prior Treatment) A language interpreter was used (Spanish).     Karla Garner is a 30 y.o. 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 Patient states "it feels as though something is squeezing my throat and preventing me from taking adequate breaths in." Patient denies fever/chills, nausea/vomiting, chest pain, or any other complaints. LMP was April 23. Pt was supposed to have her period last week, but did not. Patient was a handoff from fast track PA.  Past Medical History  Diagnosis Date  . Thrombocytopenia ([place]) 2014  . PONV (postoperative nausea and vomiting)     was on dialysis i during pregnancy2014  . Hypertension   . Hemodialysis patient ([place])     Tu, Th, Sat  . Glomerulonephritis   . CKD (chronic kidney disease), stage V [facility])    Past Surgical History  Procedure Laterality Date  . Cesarean section      2009  . Tubal ligation  2014  . Av fistula placement Left   . Ligation of arteriovenous  fistula Left 11/05/2014    Procedure: LIGATION OF LEFT ARM  BRACHIO-CEPHALIC ARTERIOVENOUS  FISTULA ,& REPAIR OF BRACHIAL ARTERY.;  Surgeon: Mal [name], MD;  Location: [place];  Service: Vascular;  Laterality: Left;  . Av fistula placement Right 04/14/2015    Procedure: CREATION OF RIGHT RADIOCEPHALIC ARTERIOVENOUS (AV) FISTULA ;  Surgeon: Angelia Mould, MD;  Location: [place];  Service: Vascular;  Laterality: Right;  . Exchange of a dialysis catheter Right 04/14/2015    Procedure: EXCHANGE OF RIGHT INTERNAL JUGULAR DIALYSIS CATHETER;  Surgeon: Angelia Mould, MD;  Location: [place];  Service: Vascular;   Laterality: Right;   No family history on file. Social History  Substance Use Topics  . Smoking status: Never Smoker   . Smokeless tobacco: Never Used  . Alcohol Use: No   OB History    Gravida Para Term Preterm AB TAB SAB Ectopic Multiple Living   3 3 1 2      2      Review of Systems  Constitutional: Negative for fever, chills and diaphoresis.  HENT: Positive for trouble swallowing.        Throat tightness  Respiratory: Positive for shortness of breath.   Cardiovascular: Negative for chest pain.  Gastrointestinal: Negative for nausea, vomiting, abdominal pain and diarrhea.  Genitourinary: Negative for dysuria.  Neurological: Negative for dizziness, light-headedness and headaches.  All other systems reviewed and are negative.     Allergies  Review of patient's allergies indicates no known allergies.  Home Medications   Prior to Admission medications   Medication Sig Start Date End Date Taking? Authorizing Provider  labetalol (NORMODYNE) 200 MG tablet Take 200 mg by mouth.   Yes Historical Provider, MD  sevelamer carbonate (RENVELA) 800 MG tablet Take 1 tablet (800 mg total) by mouth 3 (three) times daily with meals. 08/07/15  Yes Reyne Dumas, MD   BP 143/99 mmHg  Pulse 115  Temp(Src) 98.8 F (37.1 C) (Oral)  Resp 16  SpO2 100% Physical Exam  Constitutional: She is oriented to person, place, and time. She appears well-developed and well-nourished. No  distress.  HENT:  Head: Normocephalic and atraumatic.  Mouth/Throat: Uvula is midline, oropharynx is clear and moist and mucous membranes are normal.  Throat is clear as far as the posterior pharynx. No obvious swelling or abnormalities with the patient's neck.  Eyes: Conjunctivae and EOM are normal. Pupils are equal, round, and reactive to light.  Neck: Normal range of motion. Neck supple. No tracheal deviation present. No thyromegaly present.  Cardiovascular: Regular rhythm, normal heart sounds and intact distal  pulses.  Tachycardia present.   Pulmonary/Chest: Effort normal and breath sounds normal. No stridor. No respiratory distress.  Abdominal: Soft. There is no tenderness. There is no guarding.  Musculoskeletal: She exhibits no edema or tenderness.  Lymphadenopathy:    She has no cervical adenopathy.  Neurological: She is alert and oriented to person, place, and time. She has normal reflexes.  Swallow reflex intact. No sensory deficits. Strength 5/5 in all extremities. No gait disturbance. Coordination intact. Cranial nerves III-XII grossly intact. No facial droop.   Skin: Skin is warm and dry. She is not diaphoretic.  Psychiatric: She has a normal mood and affect. Her behavior is normal.  Nursing note and vitals reviewed.   ED Course  Procedures (including critical care time) Labs Review Labs Reviewed  CBC WITH DIFFERENTIAL/PLATELET - Abnormal; Notable for the following:    RBC 3.68 (*)    Hemoglobin 10.2 (*)    HCT 31.9 (*)    RDW 17.4 (*)    All other components within normal limits  BASIC METABOLIC PANEL - Abnormal; Notable for the following:    Potassium 5.2 (*)    CO2 20 (*)    Glucose, Bld 100 (*)    BUN 44 (*)    Creatinine, Ser 10.51 (*)    GFR calc non Af Amer 4 (*)    GFR calc Af Amer 5 (*)    All other components within normal limits  URINALYSIS, ROUTINE W REFLEX MICROSCOPIC (NOT AT [facility]) - Abnormal; Notable for the following:    APPearance CLOUDY (*)    Glucose, UA 250 (*)    Hgb urine dipstick SMALL (*)    Protein, ur >300 (*)    Leukocytes, UA MODERATE (*)    All other components within normal limits  MAGNESIUM - Abnormal; Notable for the following:    Magnesium 3.0 (*)    All other components within normal limits  URINE MICROSCOPIC-ADD ON - Abnormal; Notable for the following:    Squamous Epithelial / LPF 6-30 (*)    Bacteria, UA RARE (*)    All other components within normal limits  RAPID STREP SCREEN (NOT AT [facility])  CULTURE, GROUP A STREP ([place])  POC URINE  PREG, ED  POC OCCULT BLOOD, ED    Imaging Review Dg Chest 2 View  03/26/2016  CLINICAL DATA:  Shortness of breath, sore throat for 3 weeks EXAM: CHEST  2 VIEW COMPARISON:  08/02/2015 FINDINGS: Heart is upper limits normal in size. Lungs are clear. No effusions or acute bony abnormality. IMPRESSION: No active cardiopulmonary disease. Electronically Signed   By: Rolm Baptise M.D.   On: 03/26/2016 12:13   Ct Angio Chest Pe W/cm &/or Wo Cm  03/26/2016  CLINICAL DATA:  Dyspnea. Chest tightness. End-stage renal disease on hemodialysis with right upper extremity AV fistula. EXAM: CT ANGIOGRAPHY CHEST WITH CONTRAST TECHNIQUE: Multidetector CT imaging of the chest was performed using the standard protocol during bolus administration of intravenous contrast. Multiplanar CT image reconstructions and MIPs were obtained  to evaluate the vascular anatomy. CONTRAST:  80 cc Isovue 370 IV. COMPARISON:  No prior chest CT angiogram. Chest radiograph from earlier today. 08/02/2015 CT abdomen. FINDINGS: Mediastinum/Nodes: The study is high quality for the evaluation of pulmonary embolism. There are no filling defects in the central, lobar, segmental or subsegmental pulmonary artery branches to suggest acute pulmonary embolism. Great vessels are normal in course and caliber. Normal heart size. No pericardial fluid/thickening. Normal visualized thyroid. Normal esophagus. No pathologically enlarged axillary, mediastinal or hilar lymph nodes. Lungs/Pleura: No pneumothorax. No pleural effusion. Subcentimeter calcified superior segment right lower lobe granuloma. No acute consolidative airspace disease, significant pulmonary nodules or lung masses. Upper abdomen: Unremarkable. Musculoskeletal:  No aggressive appearing focal osseous lesions. Review of the MIP images confirms the above findings. IMPRESSION: No pulmonary embolism.  No active disease in the chest. Electronically Signed   By: Ilona Sorrel M.D.   On: 03/26/2016 15:25   I  have personally reviewed and evaluated these images and lab results as part of my medical decision-making.   EKG Interpretation   Date/Time:  Monday March 26 2016 12:37:15 EDT Ventricular Rate:  105 PR Interval:  135 QRS Duration: 80 QT Interval:  335 QTC Calculation: 443 R Axis:   96 Text Interpretation:  Sinus tachycardia Borderline right axis deviation  Borderline T wave abnormalities No significant change since last tracing  Confirmed by Ashok Cordia  MD, Lennette Bihari ([id number]) on 03/26/2016 12:43:26 PM      MDM   Final diagnoses:  Shortness of breath  Throat tightness    Karla Garner presents with difficulty breathing and swallowing beginning 3 days ago.  Findings and plan of care discussed with Lajean Saver, MD. Dr. Ashok Cordia personally evaluated and examined this patient.  11:11 AM Patient care handoff report taken from [facility], PA-C, who performed the patient's MSE. States patient is a dialysis patient with last dialysis on June 3. Normal amount of fluid removed. Later that day, patient began to feel a tightness in her throat and chest accompanied by some shortness of breath. Neurologic assessment performed due to the patient's complaint of difficulty swallowing. Patient is tachycardic and has shortness of breath in the setting of end-stage renal disease. Patient does not appear to be fluid overloaded. No definite signs of infection. Hemoccult obtained due to the patient's anemia, shortness breath, and tachycardia. It should be noted that the patient's shortness of breath is subjective and she shows no signs of distress or increased work of breathing. No abnormalities on the patient's CT of the chest. Patient to follow up with PCP as soon as possible for reevaluation and chronic management. Return precautions discussed. Patient voiced understanding of these instructions and is comfortable with discharge.   Filed Vitals:   03/26/16 1400 03/26/16 1415 03/26/16 1430 03/26/16 1445   BP: 126/85 138/94 143/91 146/99  Pulse: 100 101 97 102  Temp:      TempSrc:      Resp: 15 15 15 17   SpO2: 100% 99% 98% 99%     Lorayne Bender, PA-C 03/26/16 [place], MD 03/26/16 [phone]

## 2016-03-28 LAB — CULTURE, GROUP A STREP (THRC)

## 2016-07-24 DIAGNOSIS — Z23 Encounter for immunization: Secondary | ICD-10-CM | POA: Insufficient documentation

## 2016-09-14 ENCOUNTER — Emergency Department (HOSPITAL_COMMUNITY)
Admission: EM | Admit: 2016-09-14 | Discharge: 2016-09-14 | Disposition: A | Discharge status: Home / Self Care | Payer: Medicaid Other | Attending: Emergency Medicine | Admitting: Emergency Medicine

## 2016-09-14 ENCOUNTER — Encounter (HOSPITAL_COMMUNITY): Payer: Self-pay

## 2016-09-14 DIAGNOSIS — Z992 Dependence on renal dialysis: Secondary | ICD-10-CM | POA: Insufficient documentation

## 2016-09-14 DIAGNOSIS — I120 Hypertensive chronic kidney disease with stage 5 chronic kidney disease or end stage renal disease: Secondary | ICD-10-CM | POA: Insufficient documentation

## 2016-09-14 DIAGNOSIS — N186 End stage renal disease: Secondary | ICD-10-CM | POA: Insufficient documentation

## 2016-09-14 LAB — BASIC METABOLIC PANEL
Anion gap: 21 — ABNORMAL HIGH (ref 5–15)
BUN: 107 mg/dL — ABNORMAL HIGH (ref 6–20)
CO2: 16 mmol/L — ABNORMAL LOW (ref 22–32)
Calcium: 8.5 mg/dL — ABNORMAL LOW (ref 8.9–10.3)
Chloride: 101 mmol/L (ref 101–111)
Creatinine, Ser: 18.9 mg/dL — ABNORMAL HIGH (ref 0.44–1.00)
GFR calc Af Amer: 3 mL/min — ABNORMAL LOW (ref >60)
GFR calc non Af Amer: 2 mL/min — ABNORMAL LOW (ref >60)
Glucose, Bld: 110 mg/dL — ABNORMAL HIGH (ref 65–99)
Potassium: 5.4 mmol/L — ABNORMAL HIGH (ref 3.5–5.1)
Sodium: 138 mmol/L (ref 135–145)

## 2016-09-14 LAB — CBC
HCT: 32.9 % — ABNORMAL LOW (ref 36.0–46.0)
Hemoglobin: 10.8 g/dL — ABNORMAL LOW (ref 12.0–15.0)
MCH: 30.2 pg (ref 26.0–34.0)
MCHC: 32.8 g/dL (ref 30.0–36.0)
MCV: 91.9 fL (ref 78.0–100.0)
Platelets: 201 10*3/uL (ref 150–400)
RBC: 3.58 MIL/uL — ABNORMAL LOW (ref 3.87–5.11)
RDW: 14.2 % (ref 11.5–15.5)
WBC: 8.2 10*3/uL (ref 4.0–10.5)

## 2016-09-14 MED ORDER — LABETALOL HCL 5 MG/ML IV SOLN
20.0000 mg | Freq: Once | INTRAVENOUS | Status: AC
  Administered 2016-09-14: 20 mg via INTRAVENOUS
  Filled 2016-09-14: qty 4

## 2016-09-14 MED ORDER — HYDRALAZINE HCL 20 MG/ML IJ SOLN
10.0000 mg | Freq: Once | INTRAMUSCULAR | Status: AC
  Administered 2016-09-14: 10 mg via INTRAVENOUS
  Filled 2016-09-14: qty 1

## 2016-09-14 NOTE — ED Triage Notes (Addendum)
Pt reports she needs needs dialysis. She has not had dialysis in 8 days due to not having transportation. Pt reports she thinks she is in fluid overload. Triage completed using interpreter line.

## 2016-09-14 NOTE — ED Provider Notes (Signed)
[facility] DEPT Provider Note   CSN: [id number] Arrival date & time: 09/14/16  1201     History   Chief Complaint Chief Complaint  Patient presents with  . Vascular Access Problem    HPI Karla Garner is a 30 y.o. female.  HPI Patient is a 30 year old female with history significant for end-stage renal disease on HD, Tuesday, Thursday, Saturday who presents with SOB after missing her last 2 sessions. Reports she was unable to go to dialysis due to transportation difficulty. She is unable to state the name of her dialysis center, but states it is in [place]. She has been having progressively worsening SOB and intermittent chest discomfort which prompted her to seek care today. Denies weight gain, lower extremity swelling, orthopnea.   Past Medical History:  Diagnosis Date  . CKD (chronic kidney disease), stage V ([place])   . Glomerulonephritis   . Hemodialysis patient ([place])    Tu, Th, Sat  . Hypertension   . PONV (postoperative nausea and vomiting)    was on dialysis i during pregnancy2014  . Thrombocytopenia ([place]) 2014    Patient Active Problem List   Diagnosis Date Noted  . Sepsis, unspecified organism ([place])   . Bacteremia   . Lactic acidosis   . Sinus tachycardia   . ESRD on hemodialysis ([place])   . Sepsis ([place]) 08/02/2015  . Vaginal spotting 08/02/2015  . Abdominal pain 08/02/2015  . Thrombocytopenia ([place]) 08/02/2015  . Normocytic anemia 08/02/2015  . Severe major depression, single episode, without psychotic features ([place]) 06/25/2015  . ESRD needing dialysis ([place]) 06/25/2015  . Suicidal ideation   . CKD (chronic kidney disease) stage 5, GFR less than 15 ml/min (HCC) 04/09/2015  . Anemia of chronic kidney failure 04/09/2015  . ESRD (end stage renal disease) ([place]) 04/09/2015  . BP (high blood pressure) 04/30/2013  . Glomerulonephritis, IgA 04/30/2013  . Hyperkalemia 02/24/2013  . Acute on chronic renal failure ([place]) 02/23/2013  . Previous cesarean  delivery affecting pregnancy, antepartum 01/07/2013  . Glomerulonephritis 01/07/2013  . Benign essential hypertension antepartum 01/07/2013  . High risk pregnancy due to history of preterm labor 01/07/2013    Past Surgical History:  Procedure Laterality Date  . AV FISTULA PLACEMENT Left   . AV FISTULA PLACEMENT Right 04/14/2015   Procedure: CREATION OF RIGHT RADIOCEPHALIC ARTERIOVENOUS (AV) FISTULA ;  Surgeon: Angelia Mould, MD;  Location: [facility];  Service: Vascular;  Laterality: Right;  . CESAREAN SECTION     2009  . EXCHANGE OF A DIALYSIS CATHETER Right 04/14/2015   Procedure: EXCHANGE OF RIGHT INTERNAL JUGULAR DIALYSIS CATHETER;  Surgeon: Angelia Mould, MD;  Location: [facility];  Service: Vascular;  Laterality: Right;  . LIGATION OF ARTERIOVENOUS  FISTULA Left 11/05/2014   Procedure: LIGATION OF LEFT ARM  BRACHIO-CEPHALIC ARTERIOVENOUS  FISTULA ,& REPAIR OF BRACHIAL ARTERY.;  Surgeon: Mal [name], MD;  Location: [facility];  Service: Vascular;  Laterality: Left;  . TUBAL LIGATION  2014    OB History    Gravida Para Term Preterm AB Living   3 3 1 2   2    SAB TAB Ectopic Multiple Live Births           2       Home Medications    Prior to Admission medications   Medication Sig Start Date End Date Taking? Authorizing Provider  labetalol (NORMODYNE) 200 MG tablet Take 200 mg by mouth.    Historical Provider, MD  sevelamer carbonate (RENVELA) 800  MG tablet Take 1 tablet (800 mg total) by mouth 3 (three) times daily with meals. 08/07/15   Reyne Dumas, MD    Family History History reviewed. No pertinent family history.  Social History Social History  Substance Use Topics  . Smoking status: Never Smoker  . Smokeless tobacco: Never Used  . Alcohol use No     Allergies   Patient has no known allergies.   Review of Systems Review of Systems  Constitutional: Positive for fatigue. Negative for chills and fever.  HENT: Negative for ear pain and sore throat.   Eyes:  Negative for pain and visual disturbance.  Respiratory: Positive for shortness of breath. Negative for cough.   Cardiovascular: Positive for chest pain. Negative for palpitations.  Gastrointestinal: Negative for abdominal pain and vomiting.  Genitourinary: Negative for dysuria and hematuria.  Musculoskeletal: Negative for arthralgias and back pain.  Skin: Negative for color change and rash.  Neurological: Negative for seizures and syncope.  All other systems reviewed and are negative.    Physical Exam Updated Vital Signs BP (!) 165/108   Pulse 101   Temp 98.3 F (36.8 C) (Oral)   Resp 17   LMP 09/13/2016 (Within Days)   SpO2 100%   Physical Exam  Constitutional: She appears well-developed and well-nourished. No distress.  HENT:  Head: Normocephalic and atraumatic.  Eyes: Conjunctivae are normal.  Neck: Neck supple.  Cardiovascular: Normal rate and regular rhythm.   No murmur heard. Pulmonary/Chest: Effort normal and breath sounds normal. No respiratory distress. She has no wheezes. She has no rales.  Abdominal: Soft. There is no tenderness.  Musculoskeletal: She exhibits edema (trace bilateral LE edema).  Neurological: She is alert.  Skin: Skin is warm and dry.  Psychiatric: She has a normal mood and affect.  Nursing note and vitals reviewed.    ED Treatments / Results  Labs (all labs ordered are listed, but only abnormal results are displayed) Labs Reviewed  BASIC METABOLIC PANEL - Abnormal; Notable for the following:       Result Value   Potassium 5.4 (*)    CO2 16 (*)    Glucose, Bld 110 (*)    BUN 107 (*)    Creatinine, Ser 18.90 (*)    Calcium 8.5 (*)    GFR calc non Af Amer 2 (*)    GFR calc Af Amer 3 (*)    Anion gap 21 (*)    All other components within normal limits  CBC - Abnormal; Notable for the following:    RBC 3.58 (*)    Hemoglobin 10.8 (*)    HCT 32.9 (*)    All other components within normal limits    EKG  EKG  Interpretation  Date/Time:  Friday September 14 2016 15:58:17 EST Ventricular Rate:  77 PR Interval:    QRS Duration: 76 QT Interval:  473 QTC Calculation: 536 R Axis:   78 Text Interpretation:  Sinus rhythm Prolonged QT interval Baseline wander in lead(s) V4 Since last tracing rate slower Confirmed by MILLER  MD, BRIAN ([id number]) on 09/14/2016 4:10:42 PM       Radiology No results found.  Procedures Procedures (including critical care time)  Medications Ordered in ED Medications  labetalol (NORMODYNE,TRANDATE) injection 20 mg (20 mg Intravenous Given 09/14/16 1610)  hydrALAZINE (APRESOLINE) injection 10 mg (10 mg Intravenous Given 09/14/16 1704)     Initial Impression / Assessment and Plan / ED Course  I have reviewed the triage vital signs and the  nursing notes.  Pertinent labs & imaging results that were available during my care of the patient were reviewed by me and considered in my medical decision making (see chart for details).  Clinical Course     Patient is a 30 year old female who presents with shortness of breath and chest discomfort after missing her last 2 dialysis sessions.   Hypertensive with SBPs in 210s on arrival. Exam with trace pedal edema, no increased WOB. EKG without signs of hyperkalemia or acute ischemic changes. Labs with mild acidosis, mild hyperkalemia, elevated BUN/Creatinine.   Spoke with Dr. Mercy Moore (nephrology on call) who does not think patient requires emergency dialysis. Recommends blood pressure control. If able to control blood pressure, may discharge and advise to present to normal dialysis tomorrow as scheduled. If unable, advised admission to the hospital and dialysis tomorrow morning.  IV labetalol and hydralazine given With significant improvement in patient's blood pressures. Remained stable on multiple re-evaluations. Patient was discharged in stable condition. Advised to follow-up at her dialysis center tomorrow for dialysis as  scheduled. Advised patient to continue taking her blood pressure medicines as prescribed. Strict return precautions were discussed and patient reports understanding and agreement with plan.   Patient seen and discussed with Dr. Sabra Heck, ED attending.     Final Clinical Impressions(s) / ED Diagnoses   Final diagnoses:  ESRD (end stage renal disease) [facility])    New Prescriptions Discharge Medication List as of 09/14/2016  6:03 PM       Gibson Ramp, MD 09/15/16 0110    Noemi Chapel, MD 09/15/16 1440

## 2016-09-14 NOTE — Discharge Instructions (Signed)
Your labs today were discussed with the kidney doctor on call and do not indicate you need emergent dialysis, but you do need to keep your dialysis appointment tomorrow.   Go to dialysis tomorrow as scheduled. Your blood pressure was high today and  you were given some medications in the emergency department that brought it down. Continue taking your home medications as prescribed.   If for some reason you are unable to go to dialysis and you experience worsening shortness of breath, chest pain, vomiting or other worsening symptoms you need to return to the emergency department for further work up.

## 2016-09-14 NOTE — ED Provider Notes (Signed)
The patient is a 30 year old female who unfortunately has already had gone to dialysis because of renal failure, presents to1 the hospital after missing dialysis for the last week, it is not very clear as to why she missed it other than stating she did not have a ride to the dialysis facility. On exam the patient has bilateral mild edema of the legs, she has clear lungs and does not appear to be in distress. Her labs show that she has significant elevation in creatinine, BUN, potassium which is elevated above 5 and a mild acidosis. Anticipate the need for expedited dialysis. Her blood pressure is severely elevated at 887 systolic and she states that despite taking her medication she is having increasing difficulty controlling this.  BP improved prior to d/c Nephrology was consulted recommended dialysis tomorrow after BP control  I saw and evaluated the patient, reviewed the resident's note and I agree with the findings and plan.   EKG Interpretation  Date/Time:  Friday September 14 2016 15:58:17 EST Ventricular Rate:  77 PR Interval:    QRS Duration: 76 QT Interval:  473 QTC Calculation: 536 R Axis:   78 Text Interpretation:  Sinus rhythm Prolonged QT interval Baseline wander in lead(s) V4 Since last tracing rate slower Confirmed by Tobi Groesbeck  MD, Makayleigh Poliquin ([id number]) on 09/14/2016 4:10:42 PM       I personally interpreted the EKG as well as the resident and agree with the interpretation on the resident's chart.  Final diagnoses:  ESRD (end stage renal disease) (HCC)      Noemi Chapel, MD 09/15/16 1440

## 2016-09-22 IMAGING — US IR US GUIDE VASC ACCESS RIGHT
1 series · 2 of 2 positions shown · non-contrast
Comparison: none

CLINICAL DATA: Acute renal failure

[Series 1: ir fluoro/shunt/fist · 2 of 2 slices shown]
[im 1/2]
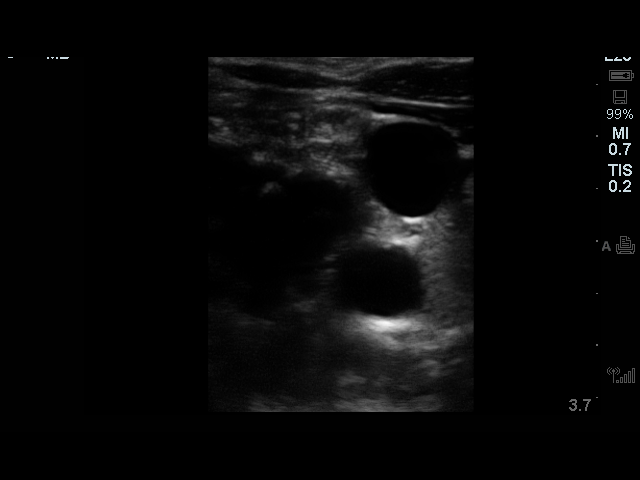
[im 2/2]
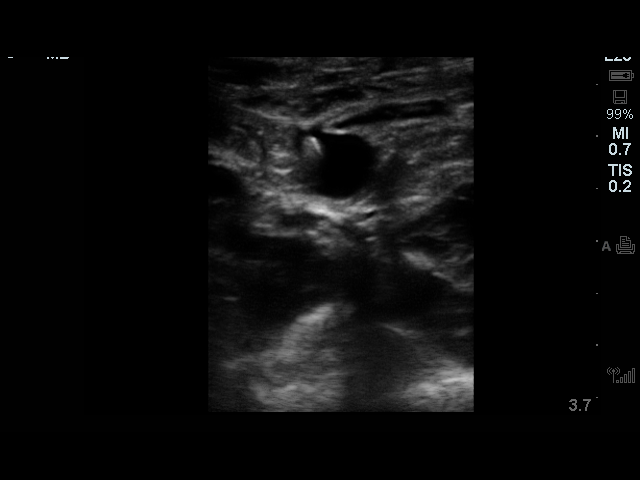

[2 of 2 positions shown; findings below may reference images not displayed]

EXAM:
IR RIGHT FLOURO GUIDE CV LINE; IR ULTRASOUND GUIDANCE VASC ACCESS
RIGHT

FLUOROSCOPY TIME:  12 seconds

MEDICATIONS AND MEDICAL HISTORY:
Versed 0 mg, Fentanyl 25 mcg.

Additional Medications: None.

ANESTHESIA/SEDATION:
Moderate sedation time: 10 minutes

CONTRAST:  None

PROCEDURE:
The procedure, risks, benefits, and alternatives were explained to
the patient. Questions regarding the procedure were encouraged and
answered. The patient understands and consents to the procedure.

The right neck was prepped with Betadine in a sterile fashion, and a
sterile drape was applied covering the operative field. A sterile
gown and sterile gloves were used for the procedure.

Under sonographic guidance, a micropuncture needle was inserted into
the right internal jugular vein and removed over a 018 wire which
was up sized to a 3 J. The dilator followed by the temporary
catheter were advanced over the wire to the cavoatrial junction. It
was flushed and sewn in place. Heparinized saline was instilled.
FINDINGS: Tip of the temporary dialysis catheter is at the cavoatrial
junction.

COMPLICATIONS:
None
IMPRESSION: Successful right internal jugular temporary dialysis catheter
placement with its tip at the cavoatrial junction.

## 2016-11-16 ENCOUNTER — Emergency Department (HOSPITAL_COMMUNITY): Payer: Medicaid Other

## 2016-11-16 ENCOUNTER — Inpatient Hospital Stay (HOSPITAL_COMMUNITY): Payer: Medicaid Other | Admitting: Anesthesiology

## 2016-11-16 ENCOUNTER — Encounter (HOSPITAL_COMMUNITY)
Admission: EM | Disposition: A | Discharge status: Home / Self Care | Payer: Self-pay | Source: Home / Self Care | Attending: Internal Medicine

## 2016-11-16 ENCOUNTER — Inpatient Hospital Stay (HOSPITAL_COMMUNITY)
Admission: EM | Admit: 2016-11-16 | Discharge: 2016-11-24 | DRG: 252 | Disposition: A | Discharge status: Home / Self Care | Payer: Medicaid Other | Attending: Internal Medicine | Admitting: Internal Medicine

## 2016-11-16 ENCOUNTER — Encounter (HOSPITAL_COMMUNITY): Payer: Self-pay | Admitting: Emergency Medicine

## 2016-11-16 DIAGNOSIS — Y832 Surgical operation with anastomosis, bypass or graft as the cause of abnormal reaction of the patient, or of later complication, without mention of misadventure at the time of the procedure: Secondary | ICD-10-CM | POA: Diagnosis present

## 2016-11-16 DIAGNOSIS — Z01810 Encounter for preprocedural cardiovascular examination: Secondary | ICD-10-CM | POA: Diagnosis not present

## 2016-11-16 DIAGNOSIS — R51 Headache: Secondary | ICD-10-CM | POA: Diagnosis present

## 2016-11-16 DIAGNOSIS — Z9119 Patient's noncompliance with other medical treatment and regimen: Secondary | ICD-10-CM | POA: Diagnosis not present

## 2016-11-16 DIAGNOSIS — N185 Chronic kidney disease, stage 5: Secondary | ICD-10-CM | POA: Diagnosis not present

## 2016-11-16 DIAGNOSIS — K2990 Gastroduodenitis, unspecified, without bleeding: Secondary | ICD-10-CM | POA: Diagnosis present

## 2016-11-16 DIAGNOSIS — N250 Renal osteodystrophy: Secondary | ICD-10-CM | POA: Diagnosis present

## 2016-11-16 DIAGNOSIS — I120 Hypertensive chronic kidney disease with stage 5 chronic kidney disease or end stage renal disease: Secondary | ICD-10-CM | POA: Diagnosis present

## 2016-11-16 DIAGNOSIS — Z419 Encounter for procedure for purposes other than remedying health state, unspecified: Secondary | ICD-10-CM

## 2016-11-16 DIAGNOSIS — N186 End stage renal disease: Secondary | ICD-10-CM | POA: Diagnosis present

## 2016-11-16 DIAGNOSIS — Z992 Dependence on renal dialysis: Secondary | ICD-10-CM | POA: Diagnosis not present

## 2016-11-16 DIAGNOSIS — E875 Hyperkalemia: Secondary | ICD-10-CM | POA: Diagnosis present

## 2016-11-16 DIAGNOSIS — N059 Unspecified nephritic syndrome with unspecified morphologic changes: Secondary | ICD-10-CM | POA: Diagnosis present

## 2016-11-16 DIAGNOSIS — D696 Thrombocytopenia, unspecified: Secondary | ICD-10-CM | POA: Diagnosis present

## 2016-11-16 DIAGNOSIS — D72829 Elevated white blood cell count, unspecified: Secondary | ICD-10-CM

## 2016-11-16 DIAGNOSIS — R109 Unspecified abdominal pain: Secondary | ICD-10-CM

## 2016-11-16 DIAGNOSIS — K2980 Duodenitis without bleeding: Secondary | ICD-10-CM | POA: Diagnosis present

## 2016-11-16 DIAGNOSIS — K2970 Gastritis, unspecified, without bleeding: Secondary | ICD-10-CM | POA: Diagnosis present

## 2016-11-16 DIAGNOSIS — K859 Acute pancreatitis without necrosis or infection, unspecified: Secondary | ICD-10-CM | POA: Diagnosis present

## 2016-11-16 DIAGNOSIS — Z79899 Other long term (current) drug therapy: Secondary | ICD-10-CM | POA: Diagnosis not present

## 2016-11-16 DIAGNOSIS — Z95828 Presence of other vascular implants and grafts: Secondary | ICD-10-CM

## 2016-11-16 DIAGNOSIS — R1013 Epigastric pain: Secondary | ICD-10-CM | POA: Diagnosis present

## 2016-11-16 DIAGNOSIS — T82868A Thrombosis of vascular prosthetic devices, implants and grafts, initial encounter: Principal | ICD-10-CM | POA: Diagnosis present

## 2016-11-16 DIAGNOSIS — D631 Anemia in chronic kidney disease: Secondary | ICD-10-CM | POA: Diagnosis present

## 2016-11-16 DIAGNOSIS — N2581 Secondary hyperparathyroidism of renal origin: Secondary | ICD-10-CM | POA: Diagnosis present

## 2016-11-16 DIAGNOSIS — K5900 Constipation, unspecified: Secondary | ICD-10-CM | POA: Diagnosis not present

## 2016-11-16 DIAGNOSIS — R933 Abnormal findings on diagnostic imaging of other parts of digestive tract: Secondary | ICD-10-CM

## 2016-11-16 DIAGNOSIS — F329 Major depressive disorder, single episode, unspecified: Secondary | ICD-10-CM | POA: Diagnosis present

## 2016-11-16 HISTORY — PX: THROMBECTOMY W/ EMBOLECTOMY: SHX2507

## 2016-11-16 LAB — COMPREHENSIVE METABOLIC PANEL
ALT: 11 U/L — AB (ref 14–54)
AST: 15 U/L (ref 15–41)
Albumin: 3.3 g/dL — ABNORMAL LOW (ref 3.5–5.0)
Alkaline Phosphatase: 64 U/L (ref 38–126)
Anion gap: 17 — ABNORMAL HIGH (ref 5–15)
BILIRUBIN TOTAL: 0.4 mg/dL (ref 0.3–1.2)
BUN: 114 mg/dL — ABNORMAL HIGH (ref 6–20)
CO2: 19 mmol/L — ABNORMAL LOW (ref 22–32)
CREATININE: 13.52 mg/dL — AB (ref 0.44–1.00)
Calcium: 9.6 mg/dL (ref 8.9–10.3)
Chloride: 100 mmol/L — ABNORMAL LOW (ref 101–111)
GFR, EST AFRICAN AMERICAN: 4 mL/min — AB (ref >60)
GFR, EST NON AFRICAN AMERICAN: 3 mL/min — AB (ref >60)
Glucose, Bld: 125 mg/dL — ABNORMAL HIGH (ref 65–99)
Potassium: 5.7 mmol/L — ABNORMAL HIGH (ref 3.5–5.1)
Sodium: 136 mmol/L (ref 135–145)
TOTAL PROTEIN: 5.8 g/dL — AB (ref 6.5–8.1)

## 2016-11-16 LAB — CBC WITH DIFFERENTIAL/PLATELET
BASOS PCT: 0 %
Basophils Absolute: 0 10*3/uL (ref 0.0–0.1)
EOS ABS: 0.4 10*3/uL (ref 0.0–0.7)
EOS PCT: 2 %
HEMATOCRIT: 40.1 % (ref 36.0–46.0)
Hemoglobin: 13.6 g/dL (ref 12.0–15.0)
Lymphocytes Relative: 13 %
Lymphs Abs: 2.8 10*3/uL (ref 0.7–4.0)
MCH: 29.9 pg (ref 26.0–34.0)
MCHC: 33.9 g/dL (ref 30.0–36.0)
MCV: 88.1 fL (ref 78.0–100.0)
MONO ABS: 1.1 10*3/uL — AB (ref 0.1–1.0)
Monocytes Relative: 5 %
NEUTROS ABS: 17.1 10*3/uL — AB (ref 1.7–7.7)
Neutrophils Relative %: 80 %
PLATELETS: 257 10*3/uL (ref 150–400)
RBC: 4.55 MIL/uL (ref 3.87–5.11)
RDW: 13.3 % (ref 11.5–15.5)
WBC: 21.4 10*3/uL — ABNORMAL HIGH (ref 4.0–10.5)

## 2016-11-16 LAB — GLUCOSE, CAPILLARY
GLUCOSE-CAPILLARY: 181 mg/dL — AB (ref 65–99)
Glucose-Capillary: 90 mg/dL (ref 65–99)

## 2016-11-16 LAB — LIPASE, BLOOD: LIPASE: 15 U/L (ref 11–51)

## 2016-11-16 LAB — I-STAT BETA HCG BLOOD, ED (MC, WL, AP ONLY)

## 2016-11-16 LAB — POCT I-STAT 4, (NA,K, GLUC, HGB,HCT)
Glucose, Bld: 115 mg/dL — ABNORMAL HIGH (ref 65–99)
HCT: 36 % (ref 36.0–46.0)
HEMOGLOBIN: 12.2 g/dL (ref 12.0–15.0)
POTASSIUM: 6 mmol/L — AB (ref 3.5–5.1)
Sodium: 134 mmol/L — ABNORMAL LOW (ref 135–145)

## 2016-11-16 LAB — POTASSIUM: POTASSIUM: 6.4 mmol/L — AB (ref 3.5–5.1)

## 2016-11-16 SURGERY — THROMBECTOMY ARTERIOVENOUS FISTULA
Anesthesia: General | Site: Neck | Laterality: Right

## 2016-11-16 SURGERY — INSERTION OF DIALYSIS CATHETER
Anesthesia: Choice

## 2016-11-16 MED ORDER — 0.9 % SODIUM CHLORIDE (POUR BTL) OPTIME
TOPICAL | Status: DC | PRN
  Administered 2016-11-16: 1000 mL

## 2016-11-16 MED ORDER — IOPAMIDOL (ISOVUE-300) INJECTION 61%
INTRAVENOUS | Status: DC | PRN
  Administered 2016-11-16: 1 mL via INTRAVENOUS

## 2016-11-16 MED ORDER — LABETALOL HCL 300 MG PO TABS
300.0000 mg | ORAL_TABLET | Freq: Two times a day (BID) | ORAL | Status: DC
  Administered 2016-11-17 – 2016-11-24 (×12): 300 mg via ORAL
  Filled 2016-11-16 (×13): qty 1

## 2016-11-16 MED ORDER — HYDROMORPHONE HCL 1 MG/ML IJ SOLN
1.0000 mg | INTRAMUSCULAR | Status: DC | PRN
  Administered 2016-11-17 – 2016-11-18 (×3): 1 mg via INTRAVENOUS
  Filled 2016-11-16 (×4): qty 1

## 2016-11-16 MED ORDER — DIPHENHYDRAMINE HCL 50 MG/ML IJ SOLN
25.0000 mg | Freq: Once | INTRAMUSCULAR | Status: AC
  Administered 2016-11-16: 25 mg via INTRAVENOUS
  Filled 2016-11-16: qty 1

## 2016-11-16 MED ORDER — FENTANYL CITRATE (PF) 100 MCG/2ML IJ SOLN
INTRAMUSCULAR | Status: AC
  Filled 2016-11-16: qty 2

## 2016-11-16 MED ORDER — HEPARIN SODIUM (PORCINE) 1000 UNIT/ML IJ SOLN
INTRAMUSCULAR | Status: AC
  Filled 2016-11-16: qty 1

## 2016-11-16 MED ORDER — AMLODIPINE BESYLATE 10 MG PO TABS
10.0000 mg | ORAL_TABLET | Freq: Every day | ORAL | Status: DC
  Administered 2016-11-17 – 2016-11-24 (×9): 10 mg via ORAL
  Filled 2016-11-16 (×9): qty 1

## 2016-11-16 MED ORDER — CISATRACURIUM BESYLATE 20 MG/10ML IV SOLN
INTRAVENOUS | Status: AC
  Filled 2016-11-16: qty 10

## 2016-11-16 MED ORDER — SODIUM CHLORIDE 0.9% FLUSH
3.0000 mL | Freq: Two times a day (BID) | INTRAVENOUS | Status: DC
  Administered 2016-11-17 – 2016-11-23 (×11): 3 mL via INTRAVENOUS

## 2016-11-16 MED ORDER — HEPARIN SODIUM (PORCINE) 5000 UNIT/ML IJ SOLN
5000.0000 [IU] | Freq: Three times a day (TID) | INTRAMUSCULAR | Status: DC
  Administered 2016-11-17 – 2016-11-21 (×11): 5000 [IU] via SUBCUTANEOUS
  Filled 2016-11-16 (×10): qty 1

## 2016-11-16 MED ORDER — PROMETHAZINE HCL 25 MG/ML IJ SOLN: 6.2500 mg | INTRAMUSCULAR | Status: DC | PRN

## 2016-11-16 MED ORDER — HEPARIN SODIUM (PORCINE) 5000 UNIT/ML IJ SOLN
INTRAMUSCULAR | Status: DC | PRN
  Administered 2016-11-16: 20:00:00

## 2016-11-16 MED ORDER — INSULIN ASPART 100 UNIT/ML ~~LOC~~ SOLN
5.0000 [IU] | Freq: Once | SUBCUTANEOUS | Status: AC
  Administered 2016-11-16: 5 [IU] via INTRAVENOUS

## 2016-11-16 MED ORDER — IOPAMIDOL (ISOVUE-300) INJECTION 61%
INTRAVENOUS | Status: AC
  Filled 2016-11-16: qty 50

## 2016-11-16 MED ORDER — ONDANSETRON HCL 4 MG/2ML IJ SOLN
4.0000 mg | Freq: Once | INTRAMUSCULAR | Status: AC
  Administered 2016-11-16: 4 mg via INTRAVENOUS
  Filled 2016-11-16: qty 2

## 2016-11-16 MED ORDER — LIDOCAINE HCL (CARDIAC) 20 MG/ML IV SOLN
INTRAVENOUS | Status: DC | PRN
  Administered 2016-11-16: 10 mg via INTRAVENOUS

## 2016-11-16 MED ORDER — SODIUM BICARBONATE 8.4 % IV SOLN
INTRAVENOUS | Status: AC
  Administered 2016-11-16: 50 meq via INTRAVENOUS
  Filled 2016-11-16: qty 50

## 2016-11-16 MED ORDER — ONDANSETRON HCL 4 MG/2ML IJ SOLN
INTRAMUSCULAR | Status: DC | PRN
  Administered 2016-11-16: 4 mg via INTRAVENOUS

## 2016-11-16 MED ORDER — FENTANYL CITRATE (PF) 100 MCG/2ML IJ SOLN: 25.0000 ug | INTRAMUSCULAR | Status: DC | PRN

## 2016-11-16 MED ORDER — LABETALOL 40 MG/ML PEDIATRIC ORAL SUSPENSION: 300.0000 mg | Freq: Two times a day (BID) | ORAL | Status: DC

## 2016-11-16 MED ORDER — HYDROMORPHONE HCL 2 MG/ML IJ SOLN
1.0000 mg | Freq: Once | INTRAMUSCULAR | Status: AC
  Administered 2016-11-16: 1 mg via INTRAVENOUS
  Filled 2016-11-16: qty 1

## 2016-11-16 MED ORDER — SUGAMMADEX SODIUM 200 MG/2ML IV SOLN
INTRAVENOUS | Status: AC
  Filled 2016-11-16: qty 2

## 2016-11-16 MED ORDER — FENTANYL CITRATE (PF) 100 MCG/2ML IJ SOLN
INTRAMUSCULAR | Status: AC
  Administered 2016-11-16: 25 ug via INTRAVENOUS
  Filled 2016-11-16: qty 2

## 2016-11-16 MED ORDER — DEXTROSE 50 % IV SOLN
INTRAVENOUS | Status: AC
Start: 2016-11-16 — End: 2016-11-16
  Administered 2016-11-16: 50 mL via INTRAVENOUS
  Filled 2016-11-16: qty 50

## 2016-11-16 MED ORDER — FENTANYL CITRATE (PF) 100 MCG/2ML IJ SOLN
25.0000 ug | INTRAMUSCULAR | Status: DC | PRN
  Administered 2016-11-16 (×2): 25 ug via INTRAVENOUS
  Administered 2016-11-16: 50 ug via INTRAVENOUS
  Administered 2016-11-16: 25 ug via INTRAVENOUS

## 2016-11-16 MED ORDER — HYDROMORPHONE HCL 2 MG/ML IJ SOLN
1.0000 mg | Freq: Once | INTRAMUSCULAR | Status: AC
Start: 2016-11-16 — End: 2016-11-16
  Administered 2016-11-16: 1 mg via INTRAVENOUS
  Filled 2016-11-16: qty 1

## 2016-11-16 MED ORDER — RENA-VITE PO TABS
1.0000 | ORAL_TABLET | Freq: Every day | ORAL | Status: DC
  Administered 2016-11-17 – 2016-11-24 (×8): 1 via ORAL
  Filled 2016-11-16 (×9): qty 1

## 2016-11-16 MED ORDER — MIDAZOLAM HCL 2 MG/2ML IJ SOLN: 0.5000 mg | Freq: Once | INTRAMUSCULAR | Status: DC | PRN

## 2016-11-16 MED ORDER — FENTANYL CITRATE (PF) 100 MCG/2ML IJ SOLN
INTRAMUSCULAR | Status: DC | PRN
  Administered 2016-11-16 (×2): 50 ug via INTRAVENOUS

## 2016-11-16 MED ORDER — PROPOFOL 10 MG/ML IV BOLUS
INTRAVENOUS | Status: AC
  Filled 2016-11-16: qty 20

## 2016-11-16 MED ORDER — DOXERCALCIFEROL 4 MCG/2ML IV SOLN
4.0000 ug | INTRAVENOUS | Status: DC
  Administered 2016-11-17 – 2016-11-24 (×6): 4 ug via INTRAVENOUS
  Filled 2016-11-16 (×4): qty 2

## 2016-11-16 MED ORDER — PROPOFOL 10 MG/ML IV BOLUS
INTRAVENOUS | Status: DC | PRN
  Administered 2016-11-16: 100 mg via INTRAVENOUS

## 2016-11-16 MED ORDER — PANTOPRAZOLE SODIUM 40 MG IV SOLR
40.0000 mg | Freq: Two times a day (BID) | INTRAVENOUS | Status: DC
  Administered 2016-11-17 – 2016-11-19 (×6): 40 mg via INTRAVENOUS
  Filled 2016-11-16 (×7): qty 40

## 2016-11-16 MED ORDER — CISATRACURIUM 2MG/ML (10ML) SYRINGE FOR MED FUSION PUMP - OPTIME
INTRAVENOUS | Status: DC | PRN
  Administered 2016-11-16: 10 mg via INTRAVENOUS

## 2016-11-16 MED ORDER — SODIUM CHLORIDE 0.9 % IV SOLN: INTRAVENOUS | Status: AC

## 2016-11-16 MED ORDER — DEXTROSE 5 % IV SOLN
INTRAVENOUS | Status: AC
  Filled 2016-11-16: qty 1.5

## 2016-11-16 MED ORDER — ONDANSETRON HCL 4 MG/2ML IJ SOLN: 4.0000 mg | Freq: Three times a day (TID) | INTRAMUSCULAR | Status: DC | PRN

## 2016-11-16 MED ORDER — HEPARIN SODIUM (PORCINE) 1000 UNIT/ML IJ SOLN
INTRAMUSCULAR | Status: DC | PRN
  Administered 2016-11-16: 1 mL via INTRAVENOUS

## 2016-11-16 MED ORDER — SODIUM CHLORIDE 0.9 % IV SOLN
INTRAVENOUS | Status: DC | PRN
  Administered 2016-11-16: 15:00:00

## 2016-11-16 MED ORDER — LIDOCAINE-EPINEPHRINE (PF) 1 %-1:200000 IJ SOLN
INTRAMUSCULAR | Status: AC
  Filled 2016-11-16: qty 30

## 2016-11-16 MED ORDER — MEPERIDINE HCL 25 MG/ML IJ SOLN: 6.2500 mg | INTRAMUSCULAR | Status: DC | PRN

## 2016-11-16 MED ORDER — DEXTROSE 50 % IV SOLN
1.0000 | Freq: Once | INTRAVENOUS | Status: AC
  Administered 2016-11-16: 50 mL via INTRAVENOUS

## 2016-11-16 MED ORDER — LIDOCAINE HCL (PF) 1 % IJ SOLN
INTRAMUSCULAR | Status: AC
  Filled 2016-11-16: qty 30

## 2016-11-16 MED ORDER — ACETAMINOPHEN 325 MG PO TABS
650.0000 mg | ORAL_TABLET | Freq: Four times a day (QID) | ORAL | Status: DC | PRN
  Administered 2016-11-19: 650 mg via ORAL
  Filled 2016-11-16: qty 2

## 2016-11-16 MED ORDER — INSULIN ASPART 100 UNIT/ML ~~LOC~~ SOLN
SUBCUTANEOUS | Status: AC
  Administered 2016-11-16: 5 [IU] via INTRAVENOUS
  Filled 2016-11-16: qty 1

## 2016-11-16 MED ORDER — SEVELAMER CARBONATE 800 MG PO TABS
800.0000 mg | ORAL_TABLET | Freq: Three times a day (TID) | ORAL | Status: DC
  Administered 2016-11-17: 800 mg via ORAL
  Filled 2016-11-16 (×2): qty 1

## 2016-11-16 MED ORDER — MIDAZOLAM HCL 2 MG/2ML IJ SOLN
INTRAMUSCULAR | Status: AC
  Filled 2016-11-16: qty 2

## 2016-11-16 MED ORDER — SODIUM BICARBONATE 8.4 % IV SOLN
50.0000 meq | Freq: Once | INTRAVENOUS | Status: AC
Start: 2016-11-16 — End: 2016-11-16
  Administered 2016-11-16: 50 meq via INTRAVENOUS

## 2016-11-16 MED ORDER — ACETAMINOPHEN 650 MG RE SUPP: 650.0000 mg | Freq: Four times a day (QID) | RECTAL | Status: DC | PRN

## 2016-11-16 MED ORDER — SODIUM CHLORIDE 0.9 % IV SOLN
INTRAVENOUS | Status: DC | PRN
  Administered 2016-11-16: 15:00:00 via INTRAVENOUS

## 2016-11-16 SURGICAL SUPPLY — 54 items
ARMBAND PINK RESTRICT EXTREMIT (MISCELLANEOUS) ×4 IMPLANT
BAG DECANTER FOR FLEXI CONT (MISCELLANEOUS) IMPLANT
BIOPATCH RED 1 DISK 7.0 (GAUZE/BANDAGES/DRESSINGS) IMPLANT
BIOPATCH RED 1IN DISK 7.0MM (GAUZE/BANDAGES/DRESSINGS)
CANISTER SUCTION 2500CC (MISCELLANEOUS) ×4 IMPLANT
CANNULA VESSEL 3MM 2 BLNT TIP (CANNULA) ×4 IMPLANT
CATH EMB 4FR 80CM (CATHETERS) ×8 IMPLANT
CATH PALINDROME RT-P 15FX19CM (CATHETERS) IMPLANT
CATH PALINDROME RT-P 15FX23CM (CATHETERS) IMPLANT
CATH PALINDROME RT-P 15FX28CM (CATHETERS) IMPLANT
CATH PALINDROME RT-P 15FX55CM (CATHETERS) IMPLANT
CLIP LIGATING EXTRA MED SLVR (CLIP) ×8 IMPLANT
CLIP LIGATING EXTRA SM BLUE (MISCELLANEOUS) ×8 IMPLANT
COVER PROBE W GEL 5X96 (DRAPES) IMPLANT
COVER SURGICAL LIGHT HANDLE (MISCELLANEOUS) ×4 IMPLANT
DECANTER SPIKE VIAL GLASS SM (MISCELLANEOUS) ×4 IMPLANT
DERMABOND ADVANCED (GAUZE/BANDAGES/DRESSINGS) ×2
DERMABOND ADVANCED .7 DNX12 (GAUZE/BANDAGES/DRESSINGS) ×2 IMPLANT
DRAPE C-ARM 42X72 X-RAY (DRAPES) IMPLANT
DRAPE CHEST BREAST 15X10 FENES (DRAPES) IMPLANT
ELECT REM PT RETURN 9FT ADLT (ELECTROSURGICAL) ×4
ELECTRODE REM PT RTRN 9FT ADLT (ELECTROSURGICAL) ×2 IMPLANT
GLOVE BIOGEL PI IND STRL 6.5 (GLOVE) ×4 IMPLANT
GLOVE BIOGEL PI IND STRL 7.0 (GLOVE) ×4 IMPLANT
GLOVE BIOGEL PI INDICATOR 6.5 (GLOVE) ×4
GLOVE BIOGEL PI INDICATOR 7.0 (GLOVE) ×4
GLOVE SS BIOGEL STRL SZ 7.5 (GLOVE) ×2 IMPLANT
GLOVE SUPERSENSE BIOGEL SZ 7.5 (GLOVE) ×2
GLOVE SURG SS PI 6.5 STRL IVOR (GLOVE) ×8 IMPLANT
GOWN STRL NON-REIN LRG LVL3 (GOWN DISPOSABLE) ×4 IMPLANT
GOWN STRL REUS W/ TWL LRG LVL3 (GOWN DISPOSABLE) ×8 IMPLANT
GOWN STRL REUS W/TWL LRG LVL3 (GOWN DISPOSABLE) ×8
KIT BASIN OR (CUSTOM PROCEDURE TRAY) ×4 IMPLANT
KIT ROOM TURNOVER OR (KITS) ×4 IMPLANT
NEEDLE 18GX1X1/2 (RX/OR ONLY) (NEEDLE) IMPLANT
NEEDLE 22X1 1/2 (OR ONLY) (NEEDLE) IMPLANT
NEEDLE HYPO 25GX1X1/2 BEV (NEEDLE) ×4 IMPLANT
NS IRRIG 1000ML POUR BTL (IV SOLUTION) ×4 IMPLANT
PACK CV ACCESS (CUSTOM PROCEDURE TRAY) ×4 IMPLANT
PACK SURGICAL SETUP 50X90 (CUSTOM PROCEDURE TRAY) IMPLANT
PAD ARMBOARD 7.5X6 YLW CONV (MISCELLANEOUS) ×8 IMPLANT
SOAP 2 % CHG 4 OZ (WOUND CARE) IMPLANT
SUT ETHILON 3 0 PS 1 (SUTURE) IMPLANT
SUT PROLENE 6 0 CC (SUTURE) ×12 IMPLANT
SUT VIC AB 3-0 SH 27 (SUTURE) ×2
SUT VIC AB 3-0 SH 27X BRD (SUTURE) ×2 IMPLANT
SUT VICRYL 4-0 PS2 18IN ABS (SUTURE) IMPLANT
SYR 20CC LL (SYRINGE) IMPLANT
SYR 5ML LL (SYRINGE) IMPLANT
SYRINGE 10CC LL (SYRINGE) IMPLANT
TOWEL OR 17X24 6PK STRL BLUE (TOWEL DISPOSABLE) ×4 IMPLANT
TOWEL OR 17X26 10 PK STRL BLUE (TOWEL DISPOSABLE) ×4 IMPLANT
UNDERPAD 30X30 (UNDERPADS AND DIAPERS) ×4 IMPLANT
WATER STERILE IRR 1000ML POUR (IV SOLUTION) ×4 IMPLANT

## 2016-11-16 NOTE — Addendum Note (Signed)
Addendum  created 11/16/16 1959 by Eligha Bridegroom, CRNA   Anesthesia Intra Meds edited

## 2016-11-16 NOTE — OR Nursing (Signed)
Karla Garner arrived from OR with hypersensitivity and pain to her right wrist.  Palpable right radial pulse but no bruit or thrill from surgical site.  No doppler signal from fistula site.  Gerri Lins PA and Dr. Donzetta Matters at bedside.  Patient is to return to OR for catheter placement.

## 2016-11-16 NOTE — ED Notes (Signed)
Pt transporting to CT  

## 2016-11-16 NOTE — Transfer of Care (Signed)
Immediate Anesthesia Transfer of Care Note  Patient: Karla Garner  Procedure(s) Performed: Procedure(s): THROMBECTOMY REVISION OF ARTERIOVENOUS FISTULA - RIGHT ARM (Right)  Patient Location: PACU  Anesthesia Type:General  Level of Consciousness: awake and alert   Airway & Oxygen Therapy: Patient Spontanous Breathing  Post-op Assessment: Report given to RN and Post -op Vital signs reviewed and stable  Post vital signs: Reviewed and stable  Last Vitals:    Last Pain:  Vitals:   11/16/16 1216  TempSrc:   PainSc: Asleep         Complications: No apparent anesthesia complications

## 2016-11-16 NOTE — H&P (Signed)
Date: 11/16/2016               Patient Name:  Karla Garner MRN: [id number]  DOB: 07/26/1986 Age / Sex: 31 y.o., female   PCP: No primary care provider on file.         Medical Service: Internal Medicine Teaching Service         Attending Physician: Dr. Lucious Groves, DO    First Contact: Dr. Inda Castle Pager: [phone]  Second Contact: Dr. Juleen China Pager: [phone]       After Hours (After 5p/  First Contact Pager: [phone]  weekends / holidays): Second Contact Pager: [phone]   Chief Complaint: Abdominal pain  History of Present Illness: Sharin Altidor is a 31 y.o. female with history of ESRD (from IgA nephritis, on TuThuSa HD), HTN, and depression who presents with one day of nausea, vomiting, and epigastric pain.  Interview conducted with video Spanish interpreter.  She had a full dialysis session on Tuesday, but missed dialysis yesterday.  Since yesterday, she has been nauseous, with several episodes of bilious vomiting, and intense, burning epigastric pain that is worse after eating.  She has never had pain like this previously.  No fevers, chills, chest pain, shortness of breath.  She has had a C-section, but no appendectomy or cholecystectomy, or other abdominal surgeries.  Yesterday, she also noted that her R AV fistula became painful and lost its thrill.  Meds:  Current Meds  Medication Sig  . amLODipine (NORVASC) 10 MG tablet Take 10 mg by mouth daily.    Allergies: Allergies as of 11/16/2016  . (No Known Allergies)   Past Medical History:  Diagnosis Date  . CKD (chronic kidney disease), stage V ([place])   . Glomerulonephritis   . Hemodialysis patient ([place])    Tu, Th, Sat  . Hypertension   . PONV (postoperative nausea and vomiting)    was on dialysis i during pregnancy2014  . Thrombocytopenia ([place]) 2014   Family History: Does not report any medical conditions in family.  Social History:  No alcohol, never smoker, no drugs  Review of  Systems: A complete ROS was negative except as per HPI.  Physical Exam: Blood pressure (!) 188/122, pulse 92, temperature 98.4 F (36.9 C), temperature source Rectal, resp. rate 13, SpO2 99 %, unknown if currently breastfeeding.  Physical Exam  Constitutional: She is oriented to person, place, and time.  Tired appearing young woman in no distress  Eyes: Conjunctivae are normal. No scleral icterus.  Neck: Normal range of motion. Neck supple.  Cardiovascular: Normal rate, regular rhythm and normal heart sounds.   R forearm AV fistula without thrill  Pulmonary/Chest: Effort normal and breath sounds normal.  Abdominal:  Soft, no distension, voluntary guarding, diffuse tenderness with intense epigastric tenderness  Musculoskeletal: She exhibits no edema or tenderness.  Neurological: She is alert and oriented to person, place, and time.  Skin: Skin is warm and dry.  Psychiatric: She has a normal mood and affect. Her behavior is normal.    CBC Latest Ref Rng & Units 11/16/2016 09/14/2016 03/26/2016  WBC 4.0 - 10.5 K/uL 21.4(H) 8.2 8.5  Hemoglobin 12.0 - 15.0 g/dL 13.6 10.8(L) 10.2(L)  Hematocrit 36.0 - 46.0 % 40.1 32.9(L) 31.9(L)  Platelets 150 - 400 K/uL 257 201 211   CMP Latest Ref Rng & Units 11/16/2016 09/14/2016 03/26/2016  Glucose 65 - 99 mg/dL 125(H) 110(H) 100(H)  BUN 6 - 20 mg/dL 114(H) 107(H) 44(H)  Creatinine 0.44 -  1.00 mg/dL 13.52(H) 18.90(H) 10.51(H)  Sodium 135 - 145 mmol/L 136 138 136  Potassium 3.5 - 5.1 mmol/L 5.7(H) 5.4(H) 5.2(H)  Chloride 101 - 111 mmol/L 100(L) 101 102  CO2 22 - 32 mmol/L 19(L) 16(L) 20(L)  Calcium 8.9 - 10.3 mg/dL 9.6 8.5(L) 9.9  Total Protein 6.5 - 8.1 g/dL 5.8(L) - -  Total Bilirubin 0.3 - 1.2 mg/dL 0.4 - -  Alkaline Phos 38 - 126 U/L 64 - -  AST 15 - 41 U/L 15 - -  ALT 14 - 54 U/L 11(L) - -   Lipase     Component Value Date/Time   LIPASE 15 11/16/2016 0736   Component     Latest Ref Rng & Units 11/16/2016  I-stat hCG, quantitative     <5  mIU/mL <5.0   RUQ Abdominal US 11/16/2016 IMPRESSION: Tiny amount of gallbladder sludge. The examination is otherwise negative.  CT Abdomen and Pelvis w/o Contrast 11/16/2016 IMPRESSION: Edema and inflammatory change about the head and neck of the pancreas and first and second portions of the duodenum could be due to duodenitis or pancreatitis. No other acute abnormality is identified.  Renal osteodystrophy.  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 female with ESRD secondary to glomerulonephritis presents with acute epigastric pain, nausea, and vomiting concerning for pancreatitis.  She has a leukocytosis and CT findings suggestive of pancreatoduodenal edema but normal lipase.  Overall, consistent with acute pancreatitis, but could represent PUD.  Uremia less likely given lack of other symptoms and recent dialysis.  Will treat with conservative management, bowel rest, IV hydration, and symptomatic treatment for pain and nausea.  #Epigastric Pain #Acute Pancreatitis Epigastric pain and CT findings of pancreatic edema suggest acute pancreatitis even with normal lipase and normal LFTs.  She is young with no cardiac history, not likely to be excessively volume sensitive -NPO -IVF -Dilaudid, Zofran PRN -IV pantoprazole BID -Lipid panel  #ESRD #Hyperkalemia -Renal following -Bicarb, insulin, D50 per renal -HD tonight pending access  #Leukocytosis Likely related to her pancreatitis. -Follow CBC  #Thrombosed AV Fistula Thrombectomy today by Dr Early this evening, rethrombosed in PACU. -Dialysis catheter placement by [facility] tonight  #HTN -Continue home amlodipine, labetalol  Fluids: NS 100 mL/hr Diet: NPO DVT Prophylaxis: heparin Code Status: full  Dispo: Admit patient to  Inpatient with expected length of stay greater than 2 midnights.  Signed: Minus Liberty, MD 11/16/2016, 3:48 PM  Pager: [phone]

## 2016-11-16 NOTE — ED Provider Notes (Signed)
[facility] DEPT Provider Note   CSN: [id number] Arrival date & time: 11/16/16  3428     History   Chief Complaint Chief Complaint  Patient presents with  . Abdominal Pain  . Emesis  . Headache    HPI Karla Garner is a 31 y.o. female.  HPI  Spanish speaking language used to obtain hx  Presents with concern for severe abdominal pain beginning yesterday. Reports the pain is epigastric without radiation. Describes that it is a burning from her epigastrium extending throughout her abdomen. Reports she's had about 8 episodes of nausea and vomiting. Reports the pain is worse with eating. Denies fevers, diarrhea, urinary symptoms, no bleeding or discharge. Notes that with EMS, they had measured a fever but does not have one here. Denies history of abdominal surgeries other than C-sections. Is on dialysis but missed yesterday due to transportation issues. Reports that last night she noted that her right forearm AV fistula no longer had a thrill, and became tender to palpation.  Past Medical History:  Diagnosis Date  . CKD (chronic kidney disease), stage V ([place])   . Glomerulonephritis   . Hemodialysis patient ([place])    Tu, Th, Sat  . Hypertension   . PONV (postoperative nausea and vomiting)    was on dialysis i during pregnancy2014  . Thrombocytopenia ([place]) 2014    Patient Active Problem List   Diagnosis Date Noted  . Pancreatitis 11/16/2016  . Sepsis, unspecified organism ([place])   . Bacteremia   . Lactic acidosis   . Sinus tachycardia   . ESRD on hemodialysis ([place])   . Sepsis ([place]) 08/02/2015  . Vaginal spotting 08/02/2015  . Abdominal pain 08/02/2015  . Thrombocytopenia ([place]) 08/02/2015  . Normocytic anemia 08/02/2015  . Severe major depression, single episode, without psychotic features ([place]) 06/25/2015  . ESRD needing dialysis ([place]) 06/25/2015  . Suicidal ideation   . CKD (chronic kidney disease) stage 5, GFR less than 15 ml/min (HCC) 04/09/2015  . Anemia  of chronic kidney failure 04/09/2015  . ESRD (end stage renal disease) ([place]) 04/09/2015  . BP (high blood pressure) 04/30/2013  . Glomerulonephritis, IgA 04/30/2013  . Hyperkalemia 02/24/2013  . Acute on chronic renal failure ([place]) 02/23/2013  . Previous cesarean delivery affecting pregnancy, antepartum 01/07/2013  . Glomerulonephritis 01/07/2013  . Benign essential hypertension antepartum 01/07/2013  . High risk pregnancy due to history of preterm labor 01/07/2013    Past Surgical History:  Procedure Laterality Date  . AV FISTULA PLACEMENT Left   . AV FISTULA PLACEMENT Right 04/14/2015   Procedure: CREATION OF RIGHT RADIOCEPHALIC ARTERIOVENOUS (AV) FISTULA ;  Surgeon: Angelia Mould, MD;  Location: [facility];  Service: Vascular;  Laterality: Right;  . CESAREAN SECTION     2009  . EXCHANGE OF A DIALYSIS CATHETER Right 04/14/2015   Procedure: EXCHANGE OF RIGHT INTERNAL JUGULAR DIALYSIS CATHETER;  Surgeon: Angelia Mould, MD;  Location: [facility];  Service: Vascular;  Laterality: Right;  . LIGATION OF ARTERIOVENOUS  FISTULA Left 11/05/2014   Procedure: LIGATION OF LEFT ARM  BRACHIO-CEPHALIC ARTERIOVENOUS  FISTULA ,& REPAIR OF BRACHIAL ARTERY.;  Surgeon: Mal [name], MD;  Location: [facility];  Service: Vascular;  Laterality: Left;  . TUBAL LIGATION  2014    OB History    Gravida Para Term Preterm AB Living   3 3 1 2   2    SAB TAB Ectopic Multiple Live Births           2  Home Medications    Prior to Admission medications   Medication Sig Start Date End Date Taking? Authorizing Provider  amLODipine (NORVASC) 10 MG tablet Take 10 mg by mouth daily.   Yes Historical Provider, MD  sevelamer carbonate (RENVELA) 800 MG tablet Take 1 tablet (800 mg total) by mouth 3 (three) times daily with meals. Patient not taking: Reported on 11/16/2016 08/07/15   Reyne Dumas, MD    Family History History reviewed. No pertinent family history.  Social History Social History  Substance  Use Topics  . Smoking status: Never Smoker  . Smokeless tobacco: Never Used  . Alcohol use No     Allergies   Patient has no known allergies.   Review of Systems Review of Systems  Constitutional: Negative for fever.  HENT: Negative for sore throat.   Eyes: Negative for visual disturbance.  Respiratory: Negative for cough and shortness of breath.   Cardiovascular: Negative for chest pain.  Gastrointestinal: Positive for abdominal pain, nausea and vomiting. Negative for constipation and diarrhea.  Genitourinary: Negative for difficulty urinating and dysuria.  Musculoskeletal: Negative for back pain, myalgias and neck pain.  Skin: Negative for rash.  Neurological: Negative for syncope and headaches.     Physical Exam Updated Vital Signs BP (!) 169/106   Pulse 78   Temp 97.9 F (36.6 C)   Resp 15   SpO2 100%   Physical Exam  Constitutional: She is oriented to person, place, and time. She appears well-developed and well-nourished. No distress.  HENT:  Head: Normocephalic and atraumatic.  Eyes: Conjunctivae and EOM are normal.  Neck: Normal range of motion.  Cardiovascular: Normal rate, regular rhythm, normal heart sounds and intact distal pulses.  Exam reveals no gallop and no friction rub.   No murmur heard. Pulmonary/Chest: Effort normal and breath sounds normal. No respiratory distress. She has no wheezes. She has no rales.  Abdominal: Soft. She exhibits no distension. There is tenderness (tenderness in epigastrum worse, however tenderness severe diffusely, including RLQ, LLQ, RUQ). There is guarding and positive Murphy's sign.  Musculoskeletal: She exhibits no edema or tenderness.  Right AV radiocephalic, very tender to palpation, no thrill  Neurological: She is alert and oriented to person, place, and time.  Skin: Skin is warm and dry. No rash noted. She is not diaphoretic. No erythema.  Nursing note and vitals reviewed.    ED Treatments / Results  Labs (all  labs ordered are listed, but only abnormal results are displayed) Labs Reviewed  CBC WITH DIFFERENTIAL/PLATELET - Abnormal; Notable for the following:       Result Value   WBC 21.4 (*)    Neutro Abs 17.1 (*)    Monocytes Absolute 1.1 (*)    All other components within normal limits  COMPREHENSIVE METABOLIC PANEL - Abnormal; Notable for the following:    Potassium 5.7 (*)    Chloride 100 (*)    CO2 19 (*)    Glucose, Bld 125 (*)    BUN 114 (*)    Creatinine, Ser 13.52 (*)    Total Protein 5.8 (*)    Albumin 3.3 (*)    ALT 11 (*)    GFR calc non Af Amer 3 (*)    GFR calc Af Amer 4 (*)    Anion gap 17 (*)    All other components within normal limits  GLUCOSE, CAPILLARY - Abnormal; Notable for the following:    Glucose-Capillary 181 (*)    All other components within normal limits  POCT I-STAT 4, (NA,K, GLUC, HGB,HCT) - Abnormal; Notable for the following:    Sodium 134 (*)    Potassium 6.0 (*)    Glucose, Bld 115 (*)    All other components within normal limits  LIPASE, BLOOD  GLUCOSE, CAPILLARY  URINALYSIS, ROUTINE W REFLEX MICROSCOPIC  I-STAT BETA HCG BLOOD, ED (MC, [facility], AP ONLY)    EKG  EKG Interpretation  Date/Time:  Friday November 16 2016 09:25:17 EST Ventricular Rate:  110 PR Interval:    QRS Duration: 79 QT Interval:  326 QTC Calculation: 441 R Axis:   86 Text Interpretation:  Sinus tachycardia Baseline wander in lead(s) V4 V5 Since prior ECG, rate has increased Confirmed by [facility] MD, Waseem Suess ([id number]) on 11/16/2016 10:30:08 AM       Radiology Ct Abdomen Pelvis Wo Contrast  Result Date: 11/16/2016 CLINICAL DATA:  Mid abdominal pain, nausea and vomiting beginning last night. EXAM: CT ABDOMEN AND PELVIS WITHOUT CONTRAST TECHNIQUE: Multidetector CT imaging of the abdomen and pelvis was performed following the standard protocol without IV contrast. COMPARISON:  CT abdomen and pelvis 08/02/2015. FINDINGS: Lower chest: Lung bases are clear. No pleural or pericardial  effusion. Hepatobiliary: No focal liver abnormality is seen. No gallstones, gallbladder wall thickening, or biliary dilatation. Pancreas: Extensive edema is present about the head and neck of the pancreas and first and second portions of the duodenum. No focal fluid collection is identified. No pancreatic ductal dilatation is seen. Spleen: Normal in size without focal abnormality. Adrenals/Urinary Tract: The kidneys are markedly atrophic consistent with chronic renal disease. Adrenal glands appear normal. Urinary bladder is decompressed. Stomach/Bowel: As noted above, there is extensive edema about the proximal and descending duodenum. Small bowel is otherwise unremarkable. The stomach and colon appear normal. No evidence of appendicitis. Vascular/Lymphatic: No significant vascular findings are present. No enlarged abdominal or pelvic lymph nodes. Reproductive: Uterus and bilateral adnexa are unremarkable. Other: Small amount of fluid is seen in the pelvis and right pericolic gutter. No focal fluid collection. No free intraperitoneal air. Musculoskeletal: Increased density of all bones most consistent with renal osteodystrophy. No focal lesion. IMPRESSION: Edema and inflammatory change about the head and neck of the pancreas and first and second portions of the duodenum could be due to duodenitis or pancreatitis. No other acute abnormality is identified. Renal osteodystrophy. Electronically Signed   By: Inge Rise M.D.   On: 11/16/2016 08:46   US Abdomen Limited Ruq  Result Date: 11/16/2016 CLINICAL DATA:  Epigastric pain for 2 days.  Thrombocytopenia EXAM: US ABDOMEN LIMITED - RIGHT UPPER QUADRANT COMPARISON:  None. FINDINGS: Gallbladder: Tiny amount of mobile sludge is identified. No stones, wall thickening or pericholecystic fluid. Sonographer reports negative Murphy's sign. Common bile duct: Diameter: 0.6 cm. Liver: No focal lesion identified. Within normal limits in parenchymal echogenicity.  IMPRESSION: Tiny amount of gallbladder sludge. The examination is otherwise negative. Electronically Signed   By: Inge Rise M.D.   On: 11/16/2016 11:04    Procedures Procedures (including critical care time)  Medications Ordered in ED Medications  dextrose 5 % with cefUROXime (ZINACEF) ADS Med (not administered)  meperidine (DEMEROL) injection 6.25-12.5 mg (not administered)  midazolam (VERSED) injection 0.5-2 mg (not administered)  promethazine (PHENERGAN) injection 6.25-12.5 mg (not administered)  fentaNYL (SUBLIMAZE) injection 25-50 mcg (50 mcg Intravenous Given 11/16/16 1712)  amLODipine (NORVASC) tablet 10 mg (not administered)  labetalol (NORMODYNE) Pediatric oral suspension 40 mg/mL (not administered)  multivitamin (RENA-VIT) tablet 1 tablet (not administered)  doxercalciferol (HECTOROL) injection  4 mcg (not administered)  fentaNYL (SUBLIMAZE) 100 MCG/2ML injection (not administered)  HYDROmorphone (DILAUDID) injection 1 mg (1 mg Intravenous Given 11/16/16 0744)  ondansetron (ZOFRAN) injection 4 mg (4 mg Intravenous Given 11/16/16 0744)  diphenhydrAMINE (BENADRYL) injection 25 mg (25 mg Intravenous Given 11/16/16 8891)  HYDROmorphone (DILAUDID) injection 1 mg (1 mg Intravenous Given 11/16/16 1005)  HYDROmorphone (DILAUDID) injection 1 mg (1 mg Intravenous Given 11/16/16 1131)  insulin aspart (novoLOG) injection 5 Units (5 Units Intravenous Given 11/16/16 1721)  dextrose 50 % solution 50 mL (50 mLs Intravenous Given 11/16/16 1721)  sodium bicarbonate injection 50 mEq (50 mEq Intravenous Given 11/16/16 1722)   CRITICAL CARE: thrombosed AV fistula taken emergently to OR Performed by: Alvino Chapel   Total critical care time: 30 minutes  Critical care time was exclusive of separately billable procedures and treating other patients.  Critical care was necessary to treat or prevent imminent or life-threatening deterioration.  Critical care was time spent personally by  me on the following activities: development of treatment plan with patient and/or surrogate as well as nursing, discussions with consultants, evaluation of patient's response to treatment, examination of patient, obtaining history from patient or surrogate, ordering and performing treatments and interventions, ordering and review of laboratory studies, ordering and review of radiographic studies, pulse oximetry and re-evaluation of patient's condition.   Initial Impression / Assessment and Plan / ED Course  I have reviewed the triage vital signs and the nursing notes.  Pertinent labs & imaging results that were available during my care of the patient were reviewed by me and considered in my medical decision making (see chart for details).     31 year old female with a history of glomerular nephritis, ESRD on dialysis Tuesday Thursday Saturday through a right radiocephalic fistula, presents with concern for abdominal pain, nausea and vomiting. Patient also reports that she's had increased pain now at her right fistula site, as well as loss of thrill.  Regarding patient's fistula, it is tender on exam, and I am unable to palpate a thrill. Consulted vascular surgery for concern of possible thrombosis. There is no sign of erythema or infection overlying the site.  Regarding her abdominal pain, nausea and vomiting, labs obtained showing mild hyperkalemia. Concern for possible gallbladder disease, however patient also exhibits severe tenderness in her lower abdomen and order CT abdomen pelvis to evaluate for other abnormalities. Given dilaudid, zofran.   CT abdomen pelvis shows signs of pancreatitis and duodenitis.   Ordered RUQ [place] which shows GB sludge, no other findings of cholecystitis.  Lipase WNL however given symptoms and CXR will admit for pancreatitis.   Consulted internal medicine for admission for abdominal pain, CT findings of pancreatitis. Vascular surgery, Dr. Donzetta Matters, to take patient to the OR  for thrombosed fistula.  Nephrology Dr. Jonnie Finner aware of patient.   Final Clinical Impressions(s) / ED Diagnoses   Final diagnoses:  Epigastric abdominal pain  Acute pancreatitis, unspecified complication status, unspecified pancreatitis type  Thrombosis of arteriovenous fistula, initial encounter [facility])    New Prescriptions Current Discharge Medication List       Gareth Morgan, MD 11/16/16 1843

## 2016-11-16 NOTE — ED Notes (Signed)
MD at bedside. 

## 2016-11-16 NOTE — Anesthesia Preprocedure Evaluation (Incomplete Revision)
Anesthesia Evaluation  Patient identified by MRN, date of birth, ID band Patient awake    Reviewed: Allergy & Precautions, H&P , NPO status , Patient's Chart, lab work & pertinent test results  History of Anesthesia Complications (+) PONV  Airway        Dental no notable dental hx.    Pulmonary neg pulmonary ROS,    Pulmonary exam normal        Cardiovascular hypertension, Pt. on medications      Neuro/Psych Depression negative neurological ROS     GI/Hepatic negative GI ROS, Neg liver ROS,   Endo/Other  negative endocrine ROS  Renal/GU CRF and DialysisRenal disease  negative genitourinary   Musculoskeletal   Abdominal   Peds  Hematology negative hematology ROS (+) anemia ,   Anesthesia Other Findings   Reproductive/Obstetrics negative OB ROS                             Anesthesia Physical Anesthesia Plan  ASA: III  Anesthesia Plan: General   Post-op Pain Management:    Induction: Intravenous  Airway Management Planned: Oral ETT  Additional Equipment:   Intra-op Plan:   Post-operative Plan: Extubation in OR  Informed Consent: I have reviewed the patients History and Physical, chart, labs and discussed the procedure including the risks, benefits and alternatives for the proposed anesthesia with the patient or authorized representative who has indicated his/her understanding and acceptance.   Dental advisory given  Plan Discussed with: CRNA  Anesthesia Plan Comments:         Anesthesia Quick Evaluation

## 2016-11-16 NOTE — Anesthesia Preprocedure Evaluation (Addendum)
Anesthesia Evaluation  Patient identified by MRN, date of birth, ID band Patient awake    Reviewed: Allergy & Precautions, NPO status , Patient's Chart, lab work & pertinent test results  History of Anesthesia Complications Negative for: history of anesthetic complications  Airway Mallampati: II  TM Distance: >3 FB Neck ROM: Full    Dental  (+) Caps, Dental Advisory Given   Pulmonary neg pulmonary ROS,    breath sounds clear to auscultation       Cardiovascular hypertension, Pt. on medications (-) angina Rhythm:Regular Rate:Normal     Neuro/Psych Depression negative neurological ROS     GI/Hepatic N/V with Acute pancreatitis   Endo/Other  negative endocrine ROS  Renal/GU ESRF and DialysisRenal disease (K+ 5.7)     Musculoskeletal   Abdominal   Peds  Hematology negative hematology ROS (+)   Anesthesia Other Findings   Reproductive/Obstetrics S/p BTL                            Anesthesia Physical Anesthesia Plan  ASA: III  Anesthesia Plan: General   Post-op Pain Management:    Induction: Intravenous and Cricoid pressure planned  Airway Management Planned: Oral ETT  Additional Equipment:   Intra-op Plan:   Post-operative Plan: Extubation in OR  Informed Consent: I have reviewed the patients History and Physical, chart, labs and discussed the procedure including the risks, benefits and alternatives for the proposed anesthesia with the patient or authorized representative who has indicated his/her understanding and acceptance.   Dental advisory given  Plan Discussed with: CRNA and Surgeon  Anesthesia Plan Comments: (Plan routine monitors, GETA)        Anesthesia Quick Evaluation

## 2016-11-16 NOTE — Consult Note (Signed)
[facility] NOTE   MRN : [id number]  Reason for Consult: thrombosed right radial cephalic fistula. Referring Physician: ED  History of Present Illness: 31 y/o female with thrombosed right UE forearm fistula since yesterday.  She reports N/V with abdominal pain over the last 24 hours as well.  She is being worked up for pancreatitis.  She will be admitted by Internal Medicine.  She received HD Tuesday.  She missed her HD appointment yesterday due to a move and transportation problems.  The fistula was created by Dr. Scot Dock 04/14/2015.  She has had another episode of thrombosis 01/06/2016 intervened by IR.  Her first access was placed 11/05/2014 by Dr. Kellie Simmering and ended with ligation of Brachiocephalic fistula due pain left brachial-cephalic AV [id number].  Past medical history includes: ESRD and HTN managed with Labetalol.       No current facility-administered medications for this encounter.    Current Outpatient Prescriptions  Medication Sig Dispense Refill  . labetalol (NORMODYNE) 200 MG tablet Take 200 mg by mouth.    . sevelamer carbonate (RENVELA) 800 MG tablet Take 1 tablet (800 mg total) by mouth 3 (three) times daily with meals. 90 tablet 0    Pt meds include: Statin :No Betablocker: Yes ASA: No Other anticoagulants/antiplatelets: none  Past Medical History:  Diagnosis Date  . CKD (chronic kidney disease), stage V ([place])   . Glomerulonephritis   . Hemodialysis patient ([place])    Tu, Th, Sat  . Hypertension   . PONV (postoperative nausea and vomiting)    was on dialysis i during pregnancy2014  . Thrombocytopenia ([place]) 2014    Past Surgical History:  Procedure Laterality Date  . AV FISTULA PLACEMENT Left   . AV FISTULA PLACEMENT Right 04/14/2015   Procedure: CREATION OF RIGHT RADIOCEPHALIC ARTERIOVENOUS (AV) FISTULA ;  Surgeon: Angelia Mould, MD;  Location: [facility];  Service: Vascular;  Laterality: Right;  . CESAREAN SECTION      2009  . EXCHANGE OF A DIALYSIS CATHETER Right 04/14/2015   Procedure: EXCHANGE OF RIGHT INTERNAL JUGULAR DIALYSIS CATHETER;  Surgeon: Angelia Mould, MD;  Location: [facility];  Service: Vascular;  Laterality: Right;  . LIGATION OF ARTERIOVENOUS  FISTULA Left 11/05/2014   Procedure: LIGATION OF LEFT ARM  BRACHIO-CEPHALIC ARTERIOVENOUS  FISTULA ,& REPAIR OF BRACHIAL ARTERY.;  Surgeon: Mal [name], MD;  Location: [facility];  Service: Vascular;  Laterality: Left;  . TUBAL LIGATION  2014    Social History Social History  Substance Use Topics  . Smoking status: Never Smoker  . Smokeless tobacco: Never Used  . Alcohol use No    Family History History reviewed. No pertinent family history.  Unknown  No Known Allergies   REVIEW OF SYSTEMS  General: [ ]  Weight loss, [ ]  Fever, [ ]  chills Neurologic: [ ]  Dizziness, [ ]  Blackouts, [ ]  Seizure [ ]  Stroke, [ ]  "Mini stroke", [ ]  Slurred speech, [ ]  Temporary blindness; [ ]  weakness in arms or legs, [ ]  Hoarseness [ ]  Dysphagia Cardiac: [ ]  Chest pain/pressure, [ ]  Shortness of breath at rest [ ]  Shortness of breath with exertion, [ ]  Atrial fibrillation or irregular heartbeat  Vascular: [ ]  Pain in legs with walking, [ ]  Pain in legs at rest, [ ]  Pain in legs at night,  [ ]  Non-healing ulcer, [ ]  Blood clot in vein/DVT,   Pulmonary: [ ]  Home oxygen, [ ]  Productive cough, [ ]  Coughing up blood, [ ]   Asthma,  [ ]  Wheezing [ ]  COPD Musculoskeletal:  [ ]  Arthritis, [ ]  Low back pain, [ ]  Joint pain Hematologic: [ ]  Easy Bruising, [ ]  Anemia; [ ]  Hepatitis Gastrointestinal: [ ]  Blood in stool, [ ]  Gastroesophageal Reflux/heartburn,[x]  N/V and abdominal pain Urinary: [ ]  chronic Kidney disease, [x ] on HD - [ ]  MWF or [x ] TTHS, [ ]  Burning with urination, [ ]  Difficulty urinating Skin: [ ]  Rashes, [ ]  Wounds Psychological: [ ]  Anxiety, [ ]  Depression  Physical Examination Vitals:   11/16/16 0715 11/16/16 0730 11/16/16 0734 11/16/16 0745   BP: (!) 185/131 (!) 187/127  (!) 162/114  Pulse: 108 99  98  Resp: 25 19  17   Temp:   98.4 F (36.9 C)   TempSrc:   Rectal   SpO2: 100% 97%  97%   There is no height or weight on file to calculate BMI.  General:  WDWN in NAD HENT: WNL Eyes: Pupils equal Pulmonary: normal non-labored breathing , without Rales, rhonchi,  wheezing Cardiac: RRR, without  Murmurs, rubs or gallops; No carotid bruits Abdomen: soft, NT, no masses Skin: no rashes, ulcers noted;  no Gangrene , no cellulitis; no open wounds;   Vascular Exam/Pulses:Palpable right radial pulse, no thrill in the radiocephalic fistula   Musculoskeletal: no muscle wasting or atrophy; no edema  Neurologic: A&O X 3; Appropriate Affect ;  SENSATION: normal; MOTOR FUNCTION: 5/5 Symmetric Speech is fluent/normal   Significant Diagnostic Studies: CBC Lab Results  Component Value Date   WBC 21.4 (H) 11/16/2016   HGB 13.6 11/16/2016   HCT 40.1 11/16/2016   MCV 88.1 11/16/2016   PLT 257 11/16/2016    BMET    Component Value Date/Time   NA 136 11/16/2016 0736   K 5.7 (H) 11/16/2016 0736   CL 100 (L) 11/16/2016 0736   CO2 19 (L) 11/16/2016 0736   GLUCOSE 125 (H) 11/16/2016 0736   BUN 114 (H) 11/16/2016 0736   CREATININE 13.52 (H) 11/16/2016 0736   CREATININE 3.74 (H) 02/16/2013 1221   CALCIUM 9.6 11/16/2016 0736   CALCIUM 8.2 (L) 04/11/2015 1508   GFRNONAA 3 (L) 11/16/2016 0736   GFRAA 4 (L) 11/16/2016 0736   CrCl cannot be calculated (Unknown ideal weight.).  COAG Lab Results  Component Value Date   INR 1.23 08/03/2015   INR 1.26 08/02/2015   INR 1.06 06/24/2015     Non-Invasive Vascular Imaging:  None  ASSESSMENT/PLAN:  ESRD Thrombosed right AV fistula.  Plan thrombectomy and possible catheter placement.  She skipped HD yesterday and will need HD this admission her K+ is 5.7 today.  Patient was spoken to via [place] interpreter and agrees with plan.   Laurence Slate [facility] 11/16/2016 9:07 AM

## 2016-11-16 NOTE — ED Notes (Signed)
Patient transported to Ultrasound 

## 2016-11-16 NOTE — Op Note (Signed)
    OPERATIVE REPORT  DATE OF SURGERY: 11/16/2016  PATIENT: Karla Garner, 31 y.o. female MRN: [id number]  DOB: 1986-08-26  PRE-OPERATIVE DIAGNOSIS: Stage renal disease with occluded right radiocephalic fistula  POST-OPERATIVE DIAGNOSIS:  Same  PROCEDURE: Thrombectomy and revision of right radiocephalic fistula to more proximal radial artery  SURGEON:  Curt Jews, M.D.  PHYSICIAN ASSISTANT: Nurse  ANESTHESIA:  Enteral  EBL: Minimal ml  No intake/output data recorded.  BLOOD ADMINISTERED: None  DRAINS: None  SPECIMEN: None  COUNTS CORRECT:  YES  PLAN OF CARE: PACU   PATIENT DISPOSITION:  PACU - hemodynamically stable  PROCEDURE DETAILS: The patient was taking up and placed supine position where the area the right arm prepped draped in sterile fashion prior incision was opened at the wrist and carried down to isolate the cephalic vein to radial artery anastomosis. Patient had a small radial artery and there was thrombus at the anastomosis. The vein above this was felt to be patent. The artery was occluded proximal and distally and the vein was opened over the hood of the anastomosis. There was thrombus present this was removed. No inflow into this from the radial artery. Patient did have a good radial artery pulse above this. Decision made to revise this further proximally. The vein was ligated at the old radial anastomosis the vein was transected. There was a sclerotic area just above the anastomosis where a 3 dilator passed with some resistance. Above this the vein was widely patent. There was no clot in vein. The vein was flushed heparinized and reoccluded and sclerotic area was removed. Artery was exposed further proximally and had a good pulse. The artery was occluded proximal and distally and was opened with 11 blade some ulcerative Potts scissors. The vein was spatulated and sewn end-to-side to the artery with a running 6-0 Prolene suture. Anastomosis was completed  after the usual flushing maneuvers. There did not appear to be adequate flow through the fistula. For this reason the artery is reoccluded and the anastomosis was taken down. The artery was opened further proximally in a 2 dilator passed without difficulty.. There was some spasm in the native artery and this did appear to be relieved with 2 dilator passing through this. The vein was spatulated somewhat further and the larger anastomosis was created again with a running 6-0 Prolene suture. Clamps removed and good thrill was noted. The wound irrigated with saline. Hemostasis tablet cautery. Wounds were closed with 3-0 Vicryl in the subcutaneous subcuticular tissue. Sterile dressing was applied.   [name], M.D., [facility] 11/16/2016 4:39 PM

## 2016-11-16 NOTE — Anesthesia Preprocedure Evaluation (Signed)
Anesthesia Evaluation  Patient identified by MRN, date of birth, ID band Patient awake    Reviewed: Allergy & Precautions, H&P , NPO status , Patient's Chart, lab work & pertinent test results  History of Anesthesia Complications (+) PONV  Airway        Dental no notable dental hx.    Pulmonary neg pulmonary ROS,    Pulmonary exam normal        Cardiovascular hypertension, Pt. on medications      Neuro/Psych Depression negative neurological ROS     GI/Hepatic negative GI ROS, Neg liver ROS,   Endo/Other  negative endocrine ROS  Renal/GU CRF and DialysisRenal disease  negative genitourinary   Musculoskeletal   Abdominal   Peds  Hematology negative hematology ROS (+) anemia ,   Anesthesia Other Findings   Reproductive/Obstetrics negative OB ROS                             Anesthesia Physical Anesthesia Plan  ASA: III  Anesthesia Plan: General   Post-op Pain Management:    Induction: Intravenous  Airway Management Planned: Oral ETT  Additional Equipment:   Intra-op Plan:   Post-operative Plan: Extubation in OR  Informed Consent: I have reviewed the patients History and Physical, chart, labs and discussed the procedure including the risks, benefits and alternatives for the proposed anesthesia with the patient or authorized representative who has indicated his/her understanding and acceptance.   Dental advisory given  Plan Discussed with: CRNA  Anesthesia Plan Comments:         Anesthesia Quick Evaluation

## 2016-11-16 NOTE — Op Note (Signed)
    Patient name: Karla Garner MRN: [id number] DOB: Oct 03, 1986 Sex: female  11/16/2016 Pre-operative Diagnosis: esrd Post-operative diagnosis:  Same Surgeon:  Erlene Quan C. Donzetta Matters, MD Procedure:  right femoral trialysis catheter placement with US guidance  Indications:  31 year old female with a recent history of a clotted right upper arm AV fistula that was declotted in the OR today surgical a malfunctioning. She was then indicated for tunneled catheter but potassium returned 6.4 and now needs femoral access for urgent dialysis.  Findings: Patent femoral vein placement of 20 cm dialysis catheter all ports flushed at completion.   Procedure:  The patient was was correctly identified. Consent was obtained via her cousin as she had general anesthesia today. We also had interpreter on the phone throughout his course of this procedure describing each step to her. We first identified the femoral vein under ultrasound guidance. We then sterilely prepped and draped the right groin in the sterile fashion called timeout. 10 mL of 1% lidocaine without epinephrine was instilled. Using ultrasound we then cannulated the right femoral vein with 18-gauge needle. A J-wire passed easily. Tract was serially dilated and a placement of a 13 French catheter via Seldinger technique. All ports flushed easily. We then instilled heparin 1.4 mL in each port. Catheter was sutured to the skin with 2-0 silk suture and sterile dressing placed. Patient tolerated procedure well without immediate complication. Catheter is ready for use.     Brandon C. Donzetta Matters, MD [facility] Office: [phone] Pager: [phone]

## 2016-11-16 NOTE — Anesthesia Postprocedure Evaluation (Addendum)
Anesthesia Post Note  Patient: Karla Garner  Procedure(s) Performed: Procedure(s) (LRB): THROMBECTOMY REVISION OF ARTERIOVENOUS FISTULA - RIGHT ARM (Right)  Patient location during evaluation: PACU Anesthesia Type: General Level of consciousness: sedated and patient cooperative Pain management: pain level controlled Vital Signs Assessment: post-procedure vital signs reviewed and stable Respiratory status: spontaneous breathing, nonlabored ventilation, respiratory function stable and patient connected to nasal cannula oxygen Cardiovascular status: blood pressure returned to baseline and stable Postop Assessment: no signs of nausea or vomiting Anesthetic complications: no Comments: Returning to OR for Diatek catheter       Last Vitals:  Vitals:   11/16/16 1345 11/16/16 1700  BP: (!) 188/122 (!) 168/109  Pulse: 92 (!) 109  Resp: 13 (!) 21  Temp:  36.6 C    Last Pain:  Vitals:   11/16/16 1216  TempSrc:   PainSc: Asleep                 [name],E. Sherilee Smotherman

## 2016-11-16 NOTE — Anesthesia Procedure Notes (Signed)
Procedure Name: Intubation Date/Time: 11/16/2016 3:10 PM Performed by: Eligha Bridegroom Pre-anesthesia Checklist: Emergency Drugs available, Suction available, Patient being monitored and Timeout performed Patient Re-evaluated:Patient Re-evaluated prior to inductionOxygen Delivery Method: Circle system utilized Preoxygenation: Pre-oxygenation with 100% oxygen Intubation Type: IV induction, Rapid sequence and Cricoid Pressure applied Ventilation: Mask ventilation without difficulty Laryngoscope Size: Mac and 3 Tube type: Oral Tube size: 7.0 mm Number of attempts: 1 Airway Equipment and Method: Stylet Placement Confirmation: ETT inserted through vocal cords under direct vision,  positive ETCO2 and breath sounds checked- equal and bilateral Secured at: 21 cm Tube secured with: Tape Dental Injury: Teeth and Oropharynx as per pre-operative assessment

## 2016-11-16 NOTE — Progress Notes (Addendum)
   Right arm avf is occluded in pacu. Will take back to OR tonight for tdc placement. Previously consented.   Tarren Sabree C. Donzetta Matters, MD [facility] [place] Office: [phone] Pager: [phone]  Addendum:  Potassium 6.4 and will need dialysis. Temporary catheter will need to be placed in femoral vein.   Aarionna Germer C. Donzetta Matters

## 2016-11-16 NOTE — ED Triage Notes (Signed)
Pt presents today from home with [facility] for abd pain and vomiting that began last night; pt states she missed HD yesterday d/t relocation and missed transportation; pt called HD center and scheduled to be dialyzed today however developed abd pain; pt also c/o headache with "lights in vision" "like im going to pass out"; pt denies syncope thus far; pt also concerned about current fistula (tenderness); pt states she used to be able to hear it but today cannot

## 2016-11-16 NOTE — Consult Note (Signed)
[facility] Renal Consultation Note    Indication for Consultation:  Management of ESRD/hemodialysis; anemia, hypertension/volume and secondary hyperparathyroidism PCP:  HPI: Karla Garner is a 31 y.o. 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  She dialyzes TTS at [facility].  Her last HD was Tuesday and she attained her EDW.  Hgb has been stable in the 10s.  She was without complaints then.  She missed her Thursday dialysis due to relocation and transportation issues and presented to the ED this am with abdominal pain and vomiting that started last night; per ED note she had 8 episodes prior to admission.  She had arranged to have her make up dialysis today, but came to the ED instead due to GI symptoms.  She also complained of a headache with lights in vision and felt like she was going to pass out. She also noted that she could not hear her AVF and it was tender. It was then noted to be clotted and she was taken to surgery for declot.  Admission labs should a K of 5.7 BUN significantly high at 114 and Cr 13.5 (higher than one would expect from just one missed HD treatment given her diminutive size), Hgb was high at 13.6 (last outpt hgb was 10.8 1/20) and WBC was very high at 21.4 80% N, 13% L , Smear showed atypical lymphs and large plts. Temp was 98.4, lipase 15, Abdominal CT showed extensive edema at the head and neck of pancreas and part of the duodenum. No ductal dilatation or GB issues noted.  Past Medical History:  Diagnosis Date  . CKD (chronic kidney disease), stage V ([place])   . Glomerulonephritis   . Hemodialysis patient ([place])    Tu, Th, Sat  . Hypertension   . PONV (postoperative nausea and vomiting)    was on dialysis i during pregnancy2014  . Thrombocytopenia ([place])  2014   Past Surgical History:  Procedure Laterality Date  . AV FISTULA PLACEMENT Left   . AV FISTULA PLACEMENT Right 04/14/2015   Procedure: CREATION OF RIGHT RADIOCEPHALIC ARTERIOVENOUS (AV) FISTULA ;  Surgeon: Angelia Mould, MD;  Location: [place];  Service: Vascular;  Laterality: Right;  . CESAREAN SECTION     2009  . EXCHANGE OF A DIALYSIS CATHETER Right 04/14/2015   Procedure: EXCHANGE OF RIGHT INTERNAL JUGULAR DIALYSIS CATHETER;  Surgeon: Angelia Mould, MD;  Location: [place];  Service: Vascular;  Laterality: Right;  . LIGATION OF ARTERIOVENOUS  FISTULA Left 11/05/2014   Procedure: LIGATION OF LEFT ARM  BRACHIO-CEPHALIC ARTERIOVENOUS  FISTULA ,& REPAIR OF BRACHIAL ARTERY.;  Surgeon: Mal [name], MD;  Location: [place];  Service: Vascular;  Laterality: Left;  . TUBAL LIGATION  2014   History reviewed. No pertinent family history. Social History:  reports that she has never smoked. She has never used smokeless tobacco. She reports that she does not drink alcohol or use drugs. No Known Allergies Prior to Admission medications   Medication Sig Start Date End Date Taking? Authorizing Provider  amLODipine (NORVASC) 10 MG tablet Take 10 mg by mouth daily.   Yes Historical Provider, MD  sevelamer carbonate (RENVELA) 800 MG tablet Take 1 tablet (800 mg total) by mouth 3 (three) times daily with meals. Patient not taking: Reported on 11/16/2016 08/07/15   Reyne Dumas, MD   Current Facility-Administered Medications  Medication  Dose Route Frequency Provider Last Rate Last Dose  . 0.9 % irrigation (POUR BTL)    PRN [name], MD   1,000 mL at 11/16/16 1438  . dextrose 5 % with cefUROXime (ZINACEF) ADS Med           . dextrose 50 % solution 50 mL  1 ampule Intravenous Once Gareth Morgan, MD      . heparin 6,000 Units in sodium chloride 0.9 % 500 mL irrigation    PRN [name], MD      . insulin aspart (novoLOG) injection 5 Units  5 Units Intravenous Once Gareth Morgan, MD        Facility-Administered Medications Ordered in Other Encounters  Medication Dose Route Frequency Provider Last Rate Last Dose  . cisatracurium 2 mg/mL (10 mL) syringe for Medfusion Pump - Optime   Intravenous Anesthesia Intra-op Eligha Bridegroom, CRNA   10 mg at 11/16/16 1509  . fentaNYL (SUBLIMAZE) injection    Anesthesia Intra-op Eligha Bridegroom, CRNA   50 mcg at 11/16/16 1509  . lidocaine (cardiac) 100 mg/61ml (XYLOCAINE) 20 MG/ML injection 2%    Anesthesia Intra-op Eligha Bridegroom, CRNA   10 mg at 11/16/16 1509  . ondansetron [employer]) injection    Anesthesia Intra-op Eligha Bridegroom, CRNA   4 mg at 11/16/16 1505  . propofol (DIPRIVAN) 10 mg/mL bolus/IV push    Anesthesia Intra-op Eligha Bridegroom, CRNA   100 mg at 11/16/16 1509   Labs: Basic Metabolic Panel:  Recent Labs Lab 11/16/16 0736  NA 136  K 5.7*  CL 100*  CO2 19*  GLUCOSE 125*  BUN 114*  CREATININE 13.52*  CALCIUM 9.6   Liver Function Tests:  Recent Labs Lab 11/16/16 0736  AST 15  ALT 11*  ALKPHOS 64  BILITOT 0.4  PROT 5.8*  ALBUMIN 3.3*    Recent Labs Lab 11/16/16 0736  LIPASE 15   No results for input(s): AMMONIA in the last 168 hours. CBC:  Recent Labs Lab 11/16/16 0736  WBC 21.4*  NEUTROABS 17.1*  HGB 13.6  HCT 40.1  MCV 88.1  PLT 257   Cardiac Enzymes: No results for input(s): CKTOTAL, CKMB, CKMBINDEX, TROPONINI in the last 168 hours. CBG: No results for input(s): GLUCAP in the last 168 hours. Iron Studies: No results for input(s): IRON, TIBC, TRANSFERRIN, FERRITIN in the last 72 hours. Studies/Results: Ct Abdomen Pelvis Wo Contrast  Result Date: 11/16/2016 CLINICAL DATA:  Mid abdominal pain, nausea and vomiting beginning last night. EXAM: CT ABDOMEN AND PELVIS WITHOUT CONTRAST TECHNIQUE: Multidetector CT imaging of the abdomen and pelvis was performed following the standard protocol without IV contrast. COMPARISON:  CT abdomen and pelvis 08/02/2015. FINDINGS: Lower chest: Lung bases are clear.  No pleural or pericardial effusion. Hepatobiliary: No focal liver abnormality is seen. No gallstones, gallbladder wall thickening, or biliary dilatation. Pancreas: Extensive edema is present about the head and neck of the pancreas and first and second portions of the duodenum. No focal fluid collection is identified. No pancreatic ductal dilatation is seen. Spleen: Normal in size without focal abnormality. Adrenals/Urinary Tract: The kidneys are markedly atrophic consistent with chronic renal disease. Adrenal glands appear normal. Urinary bladder is decompressed. Stomach/Bowel: As noted above, there is extensive edema about the proximal and descending duodenum. Small bowel is otherwise unremarkable. The stomach and colon appear normal. No evidence of appendicitis. Vascular/Lymphatic: No significant vascular findings are present. No enlarged abdominal or pelvic lymph nodes. Reproductive: Uterus and bilateral adnexa are unremarkable. Other: Small  amount of fluid is seen in the pelvis and right pericolic gutter. No focal fluid collection. No free intraperitoneal air. Musculoskeletal: Increased density of all bones most consistent with renal osteodystrophy. No focal lesion. IMPRESSION: Edema and inflammatory change about the head and neck of the pancreas and first and second portions of the duodenum could be due to duodenitis or pancreatitis. No other acute abnormality is identified. Renal osteodystrophy. Electronically Signed   By: Inge Rise M.D.   On: 11/16/2016 08:46   US Abdomen Limited Ruq  Result Date: 11/16/2016 CLINICAL DATA:  Epigastric pain for 2 days.  Thrombocytopenia EXAM: US ABDOMEN LIMITED - RIGHT UPPER QUADRANT COMPARISON:  None. FINDINGS: Gallbladder: Tiny amount of mobile sludge is identified. No stones, wall thickening or pericholecystic fluid. Sonographer reports negative Murphy's sign. Common bile duct: Diameter: 0.6 cm. Liver: No focal lesion identified. Within normal limits in  parenchymal echogenicity. IMPRESSION: Tiny amount of gallbladder sludge. The examination is otherwise negative. Electronically Signed   By: Inge Rise M.D.   On: 11/16/2016 11:04    ROS: As per HPI otherwise negative.  Physical Exam: Vitals:   11/16/16 1215 11/16/16 1245 11/16/16 1300 11/16/16 1345  BP: (!) 156/101 (!) 163/113 (!) 185/124 (!) 188/122  Pulse: 103 99 95 92  Resp: 15 15 14 13   Temp:      TempSrc:      SpO2:  96% 99% 99%     General: petite  Hispanic femaleNAD Head: NCAT sclera not icteric MMM Neck: Supple.  Lungs: CTA bilaterally without wheezes, rales, or rhonchi. Breathing is unlabored. Heart: RRR with S1 S2.  Abdomen: soft NT + BS Lower extremities:without edema or ischemic changes, no open wounds  Neuro: A & O  X 3. Moves all extremities spontaneously. Psych:  Responds to questions appropriately with a normal affect. Dialysis Access:right lower AVF  Dialysis Orders:  TTS [facility] 3.75 hours 350/A 1.5 2 K 3.5 Ca bath EDW 43.2 heparin 1700 Aranesp 25 q 2 weeks- 1/23 last dose, venofer 50/week, hectorol 4, right RC AVF Recent labs: hgb 10.8, P 5.3 (good for her) Ca 8.2 iPTH 602  Assessment/Plan: 1.  ? Pancreatitis - lipase 15, LFTs ok, CT findings c/w pancreatitis head of pancreas/ duodenal edema 2. Clotted AVF s/p thrombectomy and revision by Dr. Donnetta Hutching today - no heparin HD this evening 3. ESRD /hyperkalemia K 5.7 and BUN 114 Cr 13- due to missed HD and clotted AVF  S/p declot today - paln HD this evening; Kinetics at dialysis have been good, but I suspect she has been recirculating 4. Hypertension/volume  - Baseline diastolic BPs usual 283 - 151; rare have I seen them under 100; ongoing problem with very high BPs- will see how she does in controlled environment while taking meds-has been getting to edw; she is supposed to be on amlodipine 10 and labetolol 300 bid 5. Anemia  -hgb 13.6 - much higher than usual - last outpt Hgb was 10.8 6. Metabolic bone disease -   fosrenol 2 ac - hard to know what she actually takes, every month it is a different combination of meds though amazingly she had a normal P of 5.3 this month 7. Nutrition - renal diet/vits- d/c heart healthy diet due to high [place] [phone] 11/16/2016, 4:06 PM   Pt seen, examined, agree w assess/plan as above with additions as indicated. Seen after thromectomy/ revision of AVF and the fistula has reclotted. Anesthesia is here  and planning trip back to OR. K up 6.0 post-op, have ordered IV NaHCO3, reg insulin 5u and an amp of D50 to get K + down acutely. Still planning for HD later night after access established.     Kelly Splinter MD [employer] pager [phone]    cell [phone] 11/16/2016, 5:13 PM

## 2016-11-17 ENCOUNTER — Encounter (HOSPITAL_COMMUNITY): Payer: Self-pay | Admitting: Vascular Surgery

## 2016-11-17 DIAGNOSIS — R1013 Epigastric pain: Secondary | ICD-10-CM

## 2016-11-17 LAB — MRSA PCR SCREENING: MRSA BY PCR: NEGATIVE

## 2016-11-17 LAB — LIPID PANEL
CHOLESTEROL: 82 mg/dL (ref 0–200)
HDL: 23 mg/dL — ABNORMAL LOW (ref >40)
LDL Cholesterol: 45 mg/dL (ref 0–99)
Total CHOL/HDL Ratio: 3.6 RATIO
Triglycerides: 71 mg/dL (ref <150)
VLDL: 14 mg/dL (ref 0–40)

## 2016-11-17 LAB — BASIC METABOLIC PANEL
Anion gap: 12 (ref 5–15)
Anion gap: 20 — ABNORMAL HIGH (ref 5–15)
BUN: 47 mg/dL — AB (ref 6–20)
BUN: 129 mg/dL — AB (ref 6–20)
CALCIUM: 8.3 mg/dL — AB (ref 8.9–10.3)
CHLORIDE: 99 mmol/L — AB (ref 101–111)
CO2: 19 mmol/L — ABNORMAL LOW (ref 22–32)
CO2: 25 mmol/L (ref 22–32)
CREATININE: 7.72 mg/dL — AB (ref 0.44–1.00)
CREATININE: 13.95 mg/dL — AB (ref 0.44–1.00)
Calcium: 8.3 mg/dL — ABNORMAL LOW (ref 8.9–10.3)
Chloride: 98 mmol/L — ABNORMAL LOW (ref 101–111)
GFR calc Af Amer: 4 mL/min — ABNORMAL LOW (ref >60)
GFR calc Af Amer: 7 mL/min — ABNORMAL LOW (ref >60)
GFR calc non Af Amer: 3 mL/min — ABNORMAL LOW (ref >60)
GFR, EST NON AFRICAN AMERICAN: 6 mL/min — AB (ref >60)
GLUCOSE: 103 mg/dL — AB (ref 65–99)
Glucose, Bld: 105 mg/dL — ABNORMAL HIGH (ref 65–99)
Potassium: 4.7 mmol/L (ref 3.5–5.1)
Potassium: 6.8 mmol/L (ref 3.5–5.1)
SODIUM: 135 mmol/L (ref 135–145)
SODIUM: 138 mmol/L (ref 135–145)

## 2016-11-17 LAB — HEPATITIS B SURFACE ANTIGEN: HEP B S AG: NEGATIVE

## 2016-11-17 MED ORDER — DOXERCALCIFEROL 4 MCG/2ML IV SOLN
INTRAVENOUS | Status: AC
  Filled 2016-11-17: qty 2

## 2016-11-17 MED ORDER — LANTHANUM CARBONATE 500 MG PO CHEW
1000.0000 mg | CHEWABLE_TABLET | Freq: Three times a day (TID) | ORAL | Status: DC
  Administered 2016-11-17 – 2016-11-19 (×6): 1000 mg via ORAL
  Filled 2016-11-17 (×6): qty 2

## 2016-11-17 MED ORDER — ALTEPLASE 2 MG IJ SOLR: 2.0000 mg | Freq: Once | INTRAMUSCULAR | Status: DC | PRN

## 2016-11-17 MED ORDER — HEPARIN SODIUM (PORCINE) 1000 UNIT/ML DIALYSIS: 1000.0000 [IU] | INTRAMUSCULAR | Status: DC | PRN

## 2016-11-17 MED ORDER — LIDOCAINE HCL (PF) 1 % IJ SOLN: 5.0000 mL | INTRAMUSCULAR | Status: DC | PRN

## 2016-11-17 MED ORDER — SODIUM CHLORIDE 0.9 % IV SOLN: 100.0000 mL | INTRAVENOUS | Status: DC | PRN

## 2016-11-17 MED ORDER — PENTAFLUOROPROP-TETRAFLUOROETH EX AERO: 1.0000 "application " | INHALATION_SPRAY | CUTANEOUS | Status: DC | PRN

## 2016-11-17 MED ORDER — LIDOCAINE-PRILOCAINE 2.5-2.5 % EX CREA: 1.0000 "application " | TOPICAL_CREAM | CUTANEOUS | Status: DC | PRN

## 2016-11-17 NOTE — Progress Notes (Signed)
[facility] Progress Note   Dialysis Orders:  TTS [place] 3.75 hours 350/A 1.5 2 K 3.5 Ca bath EDW 43.2 heparin 1700 Aranesp 25 q 2 weeks- 1/23 last dose, venofer 50/week, hectorol 4, right RC AVF Recent labs: hgb 10.8, P 5.3 (good for her) Ca 8.2 iPTH 602  Assessment/Plan: 1.  Pancreatitis - lipase 15, LFTs ok, CT findings c/w pancreatitis head of pancreas/ duodenal edema/ not clear why precipitated it; seems like sx have been occurring over the past month 2. Clotted AVF s/p thrombectomy and revision by Dr. Donnetta Hutching then reclotted in PACU: K was 6.4 so too high for catheter placement - had temporary fem cath placed for HD last evening; need to determined whether AVF can be further revised on converted to AVGG+/- [facility] or do we need to abandon and pursue another access 3. ESRD /hyperkalemia K today still 4.7 after HD last evening - BUN down from 129 to 47 after HD- labs drawn about 3.5 hours after completion of HD today; plan HD again today for 3 hours to keep on schedule and check on labs 4. Hypertension/volume  - Baseline diastolic BPs usual 678 - 938; rare have I seen them under 100; ongoing problem with very high BPs- will see how she does in controlled environment while taking meds-has been getting to edw; she is supposed to be on amlodipine 10 and labetolol 300 bid; net UF 1.6 L on HD early;  this am - got off at 0330; BP better here;  5. Anemia  -hgb 13.6 1/26 - much higher than usual - last outpt Hgb was 10.8 6. Metabolic bone disease -  fosrenol 2 ac as an outpt - on renvela here - change to 1 ac - may need to hold pending intake; hard to know what she actually takes, every month it is a different combination of meds though amazingly she had a normal P of 5.3 this month. Usually on 3.5 Ca bath - K 8.3 this am start on 2.5 Ca bath 7. Nutrition - renal diet/vits- d/c heart healthy diet due to high [place] 11/17/2016,9:43 AM  LOS: 1 day   Pt seen, examined and agree w A/P as above.  Kelly Splinter MD [facility] pager [phone]   11/17/2016, 11:53 AM    Subjective:   C/o pain in right arm and abdomen  Objective Vitals:   11/17/16 0250 11/17/16 0300 11/17/16 0330 11/17/16 0401  BP: 97/61 128/80 (!) 144/98 112/76  Pulse: 64 85 78 87  Resp: 16 17 17 17   Temp:   98.4 F (36.9 C) 98.5 F (36.9 C)  TempSrc:   Oral Oral  SpO2:   98% 98%  Weight:   44.2 kg (97 lb 7.1 oz)    Physical Exam General: miserable appearing face puffy Heart: RRR Lungs: no rales Abdomen: soft +BS tender epigastrum Extremities: no sig LE edmea Dialysis Access:  Right AVF clotted - arm mild edema with puffy hand; temp right fem trialysis cath - placed 1/26   Additional Objective Labs: Basic Metabolic Panel:  Recent Labs Lab 11/16/16 0736 11/16/16 1526 11/16/16 1952 11/16/16 2359 11/17/16 0705  NA 136 134*  --  138 135  K 5.7* 6.0* 6.4* 6.8* 4.7  CL 100*  --   --  99* 98*  CO2 19*  --   --  19* 25  GLUCOSE 125* 115*  --  105* 103*  BUN 114*  --   --  129* 47*  CREATININE 13.52*  --   --  13.95* 7.72*  CALCIUM 9.6  --   --  8.3* 8.3*   Liver Function Tests:  Recent Labs Lab 11/16/16 0736  AST 15  ALT 11*  ALKPHOS 64  BILITOT 0.4  PROT 5.8*  ALBUMIN 3.3*    Recent Labs Lab 11/16/16 0736  LIPASE 15   CBC:  Recent Labs Lab 11/16/16 0736 11/16/16 1526  WBC 21.4*  --   NEUTROABS 17.1*  --   HGB 13.6 12.2  HCT 40.1 36.0  MCV 88.1  --   PLT 257  --     Recent Labs Lab 11/16/16 1655 11/16/16 1805  GLUCAP 90 181*   Lab Results  Component Value Date   INR 1.23 08/03/2015   INR 1.26 08/02/2015   INR 1.06 06/24/2015   Studies/Results: Ct Abdomen Pelvis Wo Contrast  Result Date: 11/16/2016 CLINICAL DATA:  Mid abdominal pain, nausea and vomiting beginning last night. EXAM: CT ABDOMEN AND PELVIS WITHOUT CONTRAST TECHNIQUE: Multidetector CT imaging  of the abdomen and pelvis was performed following the standard protocol without IV contrast. COMPARISON:  CT abdomen and pelvis 08/02/2015. FINDINGS: Lower chest: Lung bases are clear. No pleural or pericardial effusion. Hepatobiliary: No focal liver abnormality is seen. No gallstones, gallbladder wall thickening, or biliary dilatation. Pancreas: Extensive edema is present about the head and neck of the pancreas and first and second portions of the duodenum. No focal fluid collection is identified. No pancreatic ductal dilatation is seen. Spleen: Normal in size without focal abnormality. Adrenals/Urinary Tract: The kidneys are markedly atrophic consistent with chronic renal disease. Adrenal glands appear normal. Urinary bladder is decompressed. Stomach/Bowel: As noted above, there is extensive edema about the proximal and descending duodenum. Small bowel is otherwise unremarkable. The stomach and colon appear normal. No evidence of appendicitis. Vascular/Lymphatic: No significant vascular findings are present. No enlarged abdominal or pelvic lymph nodes. Reproductive: Uterus and bilateral adnexa are unremarkable. Other: Small amount of fluid is seen in the pelvis and right pericolic gutter. No focal fluid collection. No free intraperitoneal air. Musculoskeletal: Increased density of all bones most consistent with renal osteodystrophy. No focal lesion. IMPRESSION: Edema and inflammatory change about the head and neck of the pancreas and first and second portions of the duodenum could be due to duodenitis or pancreatitis. No other acute abnormality is identified. Renal osteodystrophy. Electronically Signed   By: Inge Rise M.D.   On: 11/16/2016 08:46   US Abdomen Limited Ruq  Result Date: 11/16/2016 CLINICAL DATA:  Epigastric pain for 2 days.  Thrombocytopenia EXAM: US ABDOMEN LIMITED - RIGHT UPPER QUADRANT COMPARISON:  None. FINDINGS: Gallbladder: Tiny amount of mobile sludge is identified. No stones, wall  thickening or pericholecystic fluid. Sonographer reports negative Murphy's sign. Common bile duct: Diameter: 0.6 cm. Liver: No focal lesion identified. Within normal limits in parenchymal echogenicity. IMPRESSION: Tiny amount of gallbladder sludge. The examination is otherwise negative. Electronically Signed   By: Inge Rise M.D.   On: 11/16/2016 11:04   Medications:  . amLODipine  10 mg Oral QHS  . doxercalciferol  4 mcg Intravenous Q M,W,F-HD  . heparin  5,000 Units Subcutaneous Q8H  . labetalol  300 mg Oral BID  . multivitamin  1 tablet Oral QHS  . pantoprazole (PROTONIX) IV  40 mg Intravenous Q12H  . sevelamer carbonate  800 mg Oral TID WC  . sodium chloride flush  3 mL Intravenous Q12H

## 2016-11-17 NOTE — Progress Notes (Signed)
  Progress Note    11/17/2016 2:36 PM 1 Day Post-Op  Subjective:  Having pain in right hand  Vitals:   11/17/16 0401 11/17/16 0951  BP: 112/76 128/76  Pulse: 87 89  Resp: 17 18  Temp: 98.5 F (36.9 C) 97.2 F (36.2 C)    Physical Exam: Awake and alert Non labored breathing R arm with palpable radial pulse, fistula without thrill Right groin catheter in place  CBC    Component Value Date/Time   WBC 21.4 (H) 11/16/2016 0736   RBC 4.55 11/16/2016 0736   HGB 12.2 11/16/2016 1526   HCT 36.0 11/16/2016 1526   PLT 257 11/16/2016 0736   MCV 88.1 11/16/2016 0736   MCH 29.9 11/16/2016 0736   MCHC 33.9 11/16/2016 0736   RDW 13.3 11/16/2016 0736   LYMPHSABS 2.8 11/16/2016 0736   MONOABS 1.1 (H) 11/16/2016 0736   EOSABS 0.4 11/16/2016 0736   BASOSABS 0.0 11/16/2016 0736    BMET    Component Value Date/Time   NA 135 11/17/2016 0705   K 4.7 11/17/2016 0705   CL 98 (L) 11/17/2016 0705   CO2 25 11/17/2016 0705   GLUCOSE 103 (H) 11/17/2016 0705   BUN 47 (H) 11/17/2016 0705   CREATININE 7.72 (H) 11/17/2016 0705   CREATININE 3.74 (H) 02/16/2013 1221   CALCIUM 8.3 (L) 11/17/2016 0705   CALCIUM 8.2 (L) 04/11/2015 1508   GFRNONAA 6 (L) 11/17/2016 0705   GFRAA 7 (L) 11/17/2016 0705    INR    Component Value Date/Time   INR 1.23 08/03/2015 0220     Intake/Output Summary (Last 24 hours) at 11/17/16 1436 Last data filed at 11/17/16 0900  Gross per 24 hour  Intake              880 ml  Output             1610 ml  Net             -730 ml     Assessment:  31 y.o. female is s/p attempted thrombectomy of right arm avf, placement of trialysis catheter in groin  Plan: Vein map for permanent access Will plan permanent access and tdc next week pending vein mapping   Derik Fults C. Donzetta Matters, MD [facility] Office: [phone] Pager: [phone]  11/17/2016 2:36 PM

## 2016-11-17 NOTE — Progress Notes (Signed)
New Admission Note:  Arrival Method: Stretcher Mental Orientation: Alert and oriented x 4. Spanish speaking.  Telemetry: Box 13 NSR, ST Assessment: Completed Skin: Warm and dry.  IV: NSL  Pain: 4/10 arm rt , rt groin cath insertion site.  Tubes: N/A Safety Measures: Safety Fall Prevention Plan was given, discussed and signed. Admission: Completed [address] [place] Orientation: Patient has been orientated to the room, unit and the staff. Family: Family members.   Orders have been reviewed and implemented. Will continue to monitor the patient. Call light has been placed within reach and bed alarm has been activated.   Sima Matas BSN, RN  Phone Number: [phone]

## 2016-11-17 NOTE — Progress Notes (Signed)
Subjective: Abdominal pain has improved.  She tried to eat breakfast, but it worsened her pain and made her nauseous.  R radiocephalic fistula rethrombosed immediated following thrombectomy yesterday afternoon.  Right femoral catheter placed and received dialysis last night.  Objective:  Vital signs in last 24 hours: Vitals:   11/17/16 0250 11/17/16 0300 11/17/16 0330 11/17/16 0401  BP: 97/61 128/80 (!) 144/98 112/76  Pulse: 64 85 78 87  Resp: 16 17 17 17   Temp:   98.4 F (36.9 C) 98.5 F (36.9 C)  TempSrc:   Oral Oral  SpO2:   98% 98%  Weight:   97 lb 7.1 oz (44.2 kg)    Physical Exam  Constitutional: She is oriented to person, place, and time. She appears well-developed and well-nourished. No distress.  Cardiovascular: Normal rate and regular rhythm.   R femoral catheter in place, mild surrounding tenderness  Pulmonary/Chest: Effort normal and breath sounds normal.  Abdominal:  Moderate tenderness to palpation most over epigastrum Soft, nondistended  Neurological: She is alert and oriented to person, place, and time.  Psychiatric: She has a normal mood and affect. Her behavior is normal.   CBC Latest Ref Rng & Units 11/16/2016 11/16/2016 09/14/2016  WBC 4.0 - 10.5 K/uL - 21.4(H) 8.2  Hemoglobin 12.0 - 15.0 g/dL 12.2 13.6 10.8(L)  Hematocrit 36.0 - 46.0 % 36.0 40.1 32.9(L)  Platelets 150 - 400 K/uL - 257 201   CMP Latest Ref Rng & Units 11/17/2016 11/16/2016 11/16/2016  Glucose 65 - 99 mg/dL 103(H) 105(H) -  BUN 6 - 20 mg/dL 47(H) 129(H) -  Creatinine 0.44 - 1.00 mg/dL 7.72(H) 13.95(H) -  Sodium 135 - 145 mmol/L 135 138 -  Potassium 3.5 - 5.1 mmol/L 4.7 6.8(HH) 6.4(HH)  Chloride 101 - 111 mmol/L 98(L) 99(L) -  CO2 22 - 32 mmol/L 25 19(L) -  Calcium 8.9 - 10.3 mg/dL 8.3(L) 8.3(L) -  Total Protein 6.5 - 8.1 g/dL - - -  Total Bilirubin 0.3 - 1.2 mg/dL - - -  Alkaline Phos 38 - 126 U/L - - -  AST 15 - 41 U/L - - -  ALT 14 - 54 U/L - - -   Lipid Panel     Component  Value Date/Time   CHOL 82 11/17/2016 0517   TRIG 71 11/17/2016 0517   HDL 23 (L) 11/17/2016 0517   CHOLHDL 3.6 11/17/2016 0517   VLDL 14 11/17/2016 0517   LDLCALC 45 11/17/2016 0517    Assessment/Plan:  Principal Problem:   Acute pancreatitis Active Problems:   Glomerulonephritis   ESRD (end stage renal disease) ([place])   Clotted renal dialysis AV graft (HCC)   Thrombosis of arteriovenous fistula ([place])  31 y.o. female with ESRD and acute epigastric pain, nausea, and vomiting and also thrombosed AV fistula.  She has a leukocytosis and CT findings suggestive of pancreatoduodenal edema but normal lipase.  Overall, consistent with acute pancreatitis, but could represent PUD.  Will treat with conservative management and PPI, EGD as inpatient if she does not improve.  VVS working on hemodialysis access, R femoral catheter placed on 1/26.  #Epigastric Pain Acute pancreatitis vs PUD.  Epigastric pain and CT findings of pancreatoduodenal edema suggest acute pancreatitis even with normal lipase and normal LFTs.  Her normal lipase and duodenal inflammation Discussed with GI Dr Collene Mares, recommended continuing BID PPI and will perform EGD next week if symptoms do not improve. -Dilaudid, Zofran PRN -IV pantoprazole BID -Advance diet as tolerate  #ESRD #  Hyperkalemia -Renal following -Bicarb, insulin, D50 per renal -HD tonight pending access  #Leukocytosis Likely related to her pancreatitis. -Follow CBC  #Thrombosed AV Fistula Thrombosed R radiocephalic fistula, femoral catheter place on 1/26 for dialysis access. -Hemodialysis access per VVS  #HTN -Continue home amlodipine, labetalol  Fluids: none Diet: renal DVT Prophylaxis: heparin Code Status: full  Dispo: Anticipated discharge in approximately 2-3 day(s).   Minus Liberty, MD 11/17/2016, 6:46 AM Pager: [phone]

## 2016-11-18 ENCOUNTER — Inpatient Hospital Stay (HOSPITAL_COMMUNITY): Payer: Medicaid Other

## 2016-11-18 DIAGNOSIS — N186 End stage renal disease: Secondary | ICD-10-CM

## 2016-11-18 DIAGNOSIS — Z01810 Encounter for preprocedural cardiovascular examination: Secondary | ICD-10-CM

## 2016-11-18 LAB — AMYLASE: AMYLASE: 145 U/L — AB (ref 28–100)

## 2016-11-18 NOTE — Progress Notes (Signed)
[facility] Progress Note   Dialysis Orders: TTS [place] 3.75 hours 350/A 1.5 2 K 3.5 Ca bath EDW 43.2 heparin 1700 Aranesp 25 q 2 weeks- 1/23 last dose, venofer 50/week, hectorol 4, right RC AVF Recent labs: hgb 10.8, P 5.3 (good for her) Ca 8.2 iPTH 602  Assessment/Plan: 1. Pancreatitis VS PUD - lipase 15, amylase ordered - not accurate indicator in ESRD pts;  LFTs ok, CT findings c/w pancreatitis head of pancreas/ duodenal edema/ not clear why precipitated it; seems like sx have been occurring over the past month. 2. Clotted AVF- reclotted after thromb and revision, temp cath placed due to ^ K - for vein mapping and new access with TDC. Appreciate VVS help 3. ESRD/hyperkalemia resolved with HDTTS - plan for next HD Tuesday with standing wts- check am labs 4. Hypertension/volume- Baseline outpatient diastolic BPs usual 540 - 086; rare have I seen them under 100; 1.7 UF Friday and 1.6 UF Sat with post weight not done - would have been about 44 based on pre weight - BP dramatically lower here suggests she is not taking BP meds at home; some pain meds may also have lowered BP 5. Anemia-hgb 13.6 1/26 - much higher than usual - last outpt Hgb was 10.8 - no ESA 6. Metabolic bone disease- fosrenol 2 ac as an outpt - on renvela here - change to 1 ac - may need to hold pending intake; hard to know what she actually takes, every month it is a different combination of meds though amazingly she had a normal P of 5.3 this month. Usually on 3.5 Ca bath - repeat labs in am 7. Nutrition- renal diet/vits- heart healthy diet discontinued due to high [place] 11/18/2016,8:20 AM  LOS: 2 days   Pt seen, examined and agree w A/P as above.  Kelly Splinter MD [facility] pager [phone]   11/18/2016, 11:14 AM    Subjective:   No new issues  Objective Vitals:   11/17/16 1455 11/17/16 1525 11/17/16  2138 11/18/16 0525  BP: 133/79 133/77 124/71 102/61  Pulse: 92 79 90 75  Resp: 12 12 15 16   Temp:   99.4 F (37.4 C) 98.5 F (36.9 C)  TempSrc:   Oral Oral  SpO2:   99% 98%  Weight:   45.8 kg (100 lb 15.5 oz)    Physical Exam General: NAD - looks like she feels better Heart: RRR Lungs: few crackles left base Abdomen: soft + epigastric tenderness Extremities: no LE edema Dialysis Access:  Right forearm clotted AVF site less swollen and tender - right fem cath    Additional Objective Labs: Basic Metabolic Panel:  Recent Labs Lab 11/16/16 0736 11/16/16 1526 11/16/16 1952 11/16/16 2359 11/17/16 0705  NA 136 134*  --  138 135  K 5.7* 6.0* 6.4* 6.8* 4.7  CL 100*  --   --  99* 98*  CO2 19*  --   --  19* 25  GLUCOSE 125* 115*  --  105* 103*  BUN 114*  --   --  129* 47*  CREATININE 13.52*  --   --  13.95* 7.72*  CALCIUM 9.6  --   --  8.3* 8.3*   Liver Function Tests:  Recent Labs Lab 11/16/16 0736  AST 15  ALT 11*  ALKPHOS 64  BILITOT 0.4  PROT 5.8*  ALBUMIN 3.3*    Recent Labs Lab 11/16/16 0736  LIPASE 15  CBC:  Recent Labs Lab 11/16/16 0736 11/16/16 1526  WBC 21.4*  --   NEUTROABS 17.1*  --   HGB 13.6 12.2  HCT 40.1 36.0  MCV 88.1  --   PLT 257  --   CBG:  Recent Labs Lab 11/16/16 1655 11/16/16 1805  GLUCAP 90 181*    Lab Results  Component Value Date   INR 1.23 08/03/2015   INR 1.26 08/02/2015   INR 1.06 06/24/2015   Studies/Results: US Abdomen Limited Ruq  Result Date: 11/16/2016 CLINICAL DATA:  Epigastric pain for 2 days.  Thrombocytopenia EXAM: US ABDOMEN LIMITED - RIGHT UPPER QUADRANT COMPARISON:  None. FINDINGS: Gallbladder: Tiny amount of mobile sludge is identified. No stones, wall thickening or pericholecystic fluid. Sonographer reports negative Murphy's sign. Common bile duct: Diameter: 0.6 cm. Liver: No focal lesion identified. Within normal limits in parenchymal echogenicity. IMPRESSION: Tiny amount of gallbladder sludge.  The examination is otherwise negative. Electronically Signed   By: Inge Rise M.D.   On: 11/16/2016 11:04   Medications:  . amLODipine  10 mg Oral QHS  . doxercalciferol  4 mcg Intravenous Q M,W,F-HD  . heparin  5,000 Units Subcutaneous Q8H  . labetalol  300 mg Oral BID  . lanthanum  1,000 mg Oral TID WC  . multivitamin  1 tablet Oral QHS  . pantoprazole (PROTONIX) IV  40 mg Intravenous Q12H  . sodium chloride flush  3 mL Intravenous Q12H

## 2016-11-18 NOTE — Progress Notes (Signed)
Right  Upper Extremity Vein Map    Cephalic  Segment Diameter Depth Comment  1. Axilla 4mm 1.72mm   2. Mid upper arm 3.70mm 1.62mm   3. Above [facility] 3.23mm 1.35mm   4. In [facility] 3.26mm 1.30mm   5. Below AC mm mm access  6. Mid forearm mm mm access  7. Wrist mm mm access   mm mm    mm mm    mm mm    Basilic  Segment Diameter Depth Comment  1. Axilla mm mm   2.Origin 5mm 7.20mm   3. Above AC 2.38mm 61mm   4. In AC 2.59mm 1.4mm   5. Below AC mm mm access  6. Mid forearm mm mm access  7. Wrist mm mm access   mm mm    mm mm    mm mm    Left Upper Extremity Vein Map    Cephalic  Segment Diameter Depth Comment  1. Axilla 2.69mm 62mm   2. Mid upper arm 55mm 3.62mm branch  3. Above AC 3.62mm 35mm branch  4. In AC mm mm thrombosis  5. Below AC mm mm thrombosis  6. Mid forearm mm mm thrombosis  7. Wrist mm mm thrombosis   mm mm    mm mm    mm mm    Basilic  Segment Diameter Depth Comment  1. Origin 5.74mm 12mm   2. Upper arm 2.78mm 1.83mm   3. Above AC 2.34mm 2.70mm Branch  4. In AC 2.76mm 2.32mm   5. Below AC 1.26mm 1.30mm   6. Mid forearm 1.33mm 2.66mm   7. Wrist 1.53mm 1.56mm    mm mm    mm mm    mm mm

## 2016-11-18 NOTE — Progress Notes (Signed)
   Subjective:  Patient has no acute complaints this morning. Abdominal pain is better, but she has not been eating.  However, she feels hungry. Pain at clotted AVF site is better.  Pain is tolerable at new femoral catheter site.  Objective:  Vital signs in last 24 hours: Vitals:   11/17/16 1455 11/17/16 1525 11/17/16 2138 11/18/16 0525  BP: 133/79 133/77 124/71 102/61  Pulse: 92 79 90 75  Resp: 12 12 15 16   Temp:   99.4 F (37.4 C) 98.5 F (36.9 C)  TempSrc:   Oral Oral  SpO2:   99% 98%  Weight:   100 lb 15.5 oz (45.8 kg)    General: lying comfortably in bed, no distress. HEENT: EOMI, no scleral icterus Cardiac: RRR Pulm: normal effort Abd: soft, tender over epigastrum, nondistended, BS hypoactive Ext: warm and well perfused, no pedal edema, tenderness over right AVF w/o thrill, right femoral catheter in place with mild surrounding tenderness Neuro: alert and oriented X3, cranial nerves II-XII grossly intact Psych: normal mood, normal affect  Assessment/Plan:  Principal Problem:   Acute pancreatitis Active Problems:   Glomerulonephritis   ESRD (end stage renal disease) (HCC)   Clotted renal dialysis AV graft (HCC)   Thrombosis of arteriovenous fistula (HCC)   Epigastric abdominal pain  30 y.o.femalewith ESRD and acute epigastric pain, nausea, and vomiting and also thrombosed AV fistula. She has a leukocytosis and CT findings suggestive of pancreatoduodenal edema but normal lipase. Overall, consistent with acute pancreatitis, but could represent PUD. Will treat with conservative management and PPI, EGD as inpatient if she does not improve.  VVS working on hemodialysis access, R femoral catheter placed on 1/26.  #Epigastric Pain Acute pancreatitis vs PUD.  Epigastric pain and CT findings of pancreatoduodenal edema suggest acute pancreatitis even with normal lipase and normal LFTs.  Her normal lipase and duodenal inflammation were discussed with GI Dr Collene Mares on 1/27,  recommended continuing BID PPI and will perform EGD next week if symptoms do not improve. She reports little PO intake as the pain is aggravated by eating but is hungry and wanting to eat more today hopefully -Dilaudid, Zofran PRN -- she has been requiring only 2 doses of Dilaudid yesterday and none of Zofran -IV pantoprazole BID -Advance diet as tolerate  #ESRD #Hyperkalemia - resolved  -Renal following -HD per nephrology.  Current access is via right femoral catheter -Renal function panel in the morning  #Leukocytosis Likely related to her pancreatitis. -CBC in the morning  #Thrombosed AV Fistula Thrombosed R radiocephalic fistula, femoral catheter place on 1/26 for dialysis access. -Hemodialysis access per VVS -Vein mapping ordered, plan for permanent access and tunneled HD cath next week pending results of mapping  #HTN -Continue home amlodipine, labetalol  Fluids: none Diet:renal DVT Prophylaxis: heparin Code Status: full  Dispo: Anticipated discharge in approximately 3-4 day(s).   Jule Ser, DO 11/18/2016, 7:22 AM Pager: [phone]

## 2016-11-18 NOTE — Progress Notes (Signed)
  Progress Note    11/18/2016 12:33 PM 2 Days Post-Op  Subjective:  Feeling better today from abdominal and R arm pain standpoint  Vitals:   11/18/16 0525 11/18/16 0956  BP: 102/61 127/66  Pulse: 75 90  Resp: 16 18  Temp: 98.5 F (36.9 C) 98.9 F (37.2 C)    Physical Exam: Awake and alert Abdomen is soft but ttp R arm also ttp, palpable radial pulse and incision healing well, no thrill in avf R groin catheter in place without hematoma  CBC    Component Value Date/Time   WBC 21.4 (H) 11/16/2016 0736   RBC 4.55 11/16/2016 0736   HGB 12.2 11/16/2016 1526   HCT 36.0 11/16/2016 1526   PLT 257 11/16/2016 0736   MCV 88.1 11/16/2016 0736   MCH 29.9 11/16/2016 0736   MCHC 33.9 11/16/2016 0736   RDW 13.3 11/16/2016 0736   LYMPHSABS 2.8 11/16/2016 0736   MONOABS 1.1 (H) 11/16/2016 0736   EOSABS 0.4 11/16/2016 0736   BASOSABS 0.0 11/16/2016 0736    BMET    Component Value Date/Time   NA 135 11/17/2016 0705   K 4.7 11/17/2016 0705   CL 98 (L) 11/17/2016 0705   CO2 25 11/17/2016 0705   GLUCOSE 103 (H) 11/17/2016 0705   BUN 47 (H) 11/17/2016 0705   CREATININE 7.72 (H) 11/17/2016 0705   CREATININE 3.74 (H) 02/16/2013 1221   CALCIUM 8.3 (L) 11/17/2016 0705   CALCIUM 8.2 (L) 04/11/2015 1508   GFRNONAA 6 (L) 11/17/2016 0705   GFRAA 7 (L) 11/17/2016 0705    INR    Component Value Date/Time   INR 1.23 08/03/2015 0220     Intake/Output Summary (Last 24 hours) at 11/18/16 1233 Last data filed at 11/18/16 0900  Gross per 24 hour  Intake              240 ml  Output                0 ml  Net              240 ml     Assessment:  31 y.o. female is here with abdominal pain concerning for pancreatitis. Also had thrombosed avf that had attempt at salvage, now on hd via R femoral temporary catheter  Plan: Vein mapping ordered Will need tdc and new permanent access this week   Brandon C. Donzetta Matters, MD [facility] [place] Office:  [phone] Pager: [phone]  11/18/2016 12:33 PM

## 2016-11-19 LAB — CBC
HEMATOCRIT: 26.9 % — AB (ref 36.0–46.0)
HEMOGLOBIN: 8.9 g/dL — AB (ref 12.0–15.0)
MCH: 29.6 pg (ref 26.0–34.0)
MCHC: 33.1 g/dL (ref 30.0–36.0)
MCV: 89.4 fL (ref 78.0–100.0)
Platelets: 151 10*3/uL (ref 150–400)
RBC: 3.01 MIL/uL — AB (ref 3.87–5.11)
RDW: 13.7 % (ref 11.5–15.5)
WBC: 6.4 10*3/uL (ref 4.0–10.5)

## 2016-11-19 LAB — RENAL FUNCTION PANEL
Albumin: 2.3 g/dL — ABNORMAL LOW (ref 3.5–5.0)
Anion gap: 14 (ref 5–15)
BUN: 57 mg/dL — AB (ref 6–20)
CHLORIDE: 98 mmol/L — AB (ref 101–111)
CO2: 23 mmol/L (ref 22–32)
Calcium: 8.8 mg/dL — ABNORMAL LOW (ref 8.9–10.3)
Creatinine, Ser: 9.39 mg/dL — ABNORMAL HIGH (ref 0.44–1.00)
GFR calc Af Amer: 6 mL/min — ABNORMAL LOW (ref >60)
GFR, EST NON AFRICAN AMERICAN: 5 mL/min — AB (ref >60)
Glucose, Bld: 110 mg/dL — ABNORMAL HIGH (ref 65–99)
POTASSIUM: 4.2 mmol/L (ref 3.5–5.1)
Phosphorus: 6.4 mg/dL — ABNORMAL HIGH (ref 2.5–4.6)
Sodium: 135 mmol/L (ref 135–145)

## 2016-11-19 MED ORDER — SODIUM CHLORIDE 0.9 % IV SOLN: 100.0000 mL | INTRAVENOUS | Status: DC | PRN

## 2016-11-19 MED ORDER — HEPARIN SODIUM (PORCINE) 1000 UNIT/ML DIALYSIS: 20.0000 [IU]/kg | INTRAMUSCULAR | Status: DC | PRN

## 2016-11-19 MED ORDER — DOXERCALCIFEROL 4 MCG/2ML IV SOLN
INTRAVENOUS | Status: AC
  Administered 2016-11-19: 4 ug via INTRAVENOUS
  Filled 2016-11-19: qty 2

## 2016-11-19 MED ORDER — LANTHANUM CARBONATE 500 MG PO CHEW
2000.0000 mg | CHEWABLE_TABLET | Freq: Three times a day (TID) | ORAL | Status: DC
Start: 2016-11-19 — End: 2016-11-25
  Administered 2016-11-19 – 2016-11-24 (×11): 2000 mg via ORAL
  Filled 2016-11-19 (×11): qty 4

## 2016-11-19 MED ORDER — HEPARIN SODIUM (PORCINE) 1000 UNIT/ML DIALYSIS: 1000.0000 [IU] | INTRAMUSCULAR | Status: DC | PRN

## 2016-11-19 MED ORDER — PENTAFLUOROPROP-TETRAFLUOROETH EX AERO: 1.0000 "application " | INHALATION_SPRAY | CUTANEOUS | Status: DC | PRN

## 2016-11-19 MED ORDER — ALTEPLASE 2 MG IJ SOLR: 2.0000 mg | Freq: Once | INTRAMUSCULAR | Status: DC | PRN

## 2016-11-19 MED ORDER — LIDOCAINE-PRILOCAINE 2.5-2.5 % EX CREA: 1.0000 "application " | TOPICAL_CREAM | CUTANEOUS | Status: DC | PRN

## 2016-11-19 MED ORDER — LIDOCAINE HCL (PF) 1 % IJ SOLN: 5.0000 mL | INTRAMUSCULAR | Status: DC | PRN

## 2016-11-19 NOTE — Progress Notes (Signed)
  Progress Note    11/19/2016 2:03 PM 3 Days Post-Op  Subjective:  No acute issues  Vitals:   11/19/16 0504 11/19/16 0931  BP: 102/61 118/75  Pulse: 87 97  Resp: 18 18  Temp: 97.9 F (36.6 C) 98.2 F (36.8 C)    Physical Exam: Awake and alert Right hand less ttp Abdomen is soft Palpable right radial pulse R femoral catheter in place without erythems  CBC    Component Value Date/Time   WBC 6.4 11/19/2016 0505   RBC 3.01 (L) 11/19/2016 0505   HGB 8.9 (L) 11/19/2016 0505   HCT 26.9 (L) 11/19/2016 0505   PLT 151 11/19/2016 0505   MCV 89.4 11/19/2016 0505   MCH 29.6 11/19/2016 0505   MCHC 33.1 11/19/2016 0505   RDW 13.7 11/19/2016 0505   LYMPHSABS 2.8 11/16/2016 0736   MONOABS 1.1 (H) 11/16/2016 0736   EOSABS 0.4 11/16/2016 0736   BASOSABS 0.0 11/16/2016 0736    BMET    Component Value Date/Time   NA 135 11/19/2016 0505   K 4.2 11/19/2016 0505   CL 98 (L) 11/19/2016 0505   CO2 23 11/19/2016 0505   GLUCOSE 110 (H) 11/19/2016 0505   BUN 57 (H) 11/19/2016 0505   CREATININE 9.39 (H) 11/19/2016 0505   CREATININE 3.74 (H) 02/16/2013 1221   CALCIUM 8.8 (L) 11/19/2016 0505   CALCIUM 8.2 (L) 04/11/2015 1508   GFRNONAA 5 (L) 11/19/2016 0505   GFRAA 6 (L) 11/19/2016 0505    INR    Component Value Date/Time   INR 1.23 08/03/2015 0220     Intake/Output Summary (Last 24 hours) at 11/19/16 1403 Last data filed at 11/19/16 1231  Gross per 24 hour  Intake              360 ml  Output                0 ml  Net              360 ml     Assessment:  31 y.o. female is attempted thrombectomy of right arm avf, on hd via R femoral temp cath  Plan: Earliest date for surgery is Thursday and will plan tdc and right arm avf on that date.    Janeane Cozart C. Donzetta Matters, MD [facility] [place] Office: [phone] Pager: [phone]  11/19/2016 2:03 PM

## 2016-11-19 NOTE — Progress Notes (Signed)
[facility] Progress Note   Dialysis Orders: TTS [place] 3.75 hours 350/A 1.5 2 K 3.5 Ca bath EDW 43.2 heparin 1700 Aranesp 25 q 2 weeks- 1/23 last dose, venofer 50/week, hectorol 4, right RC AVF Recent labs: hgb 10.8, P 5.3 (good for her) Ca 8.2 iPTH 602  Assessment/Plan: 1. Pancreatitis VS PUD - LFTs ok, CT findings c/w pancreatitis head of pancreas/ duodenal edema/ not clear why precipitated it; seems like sx have been occurring over the past month; on IV PPI 2. Clotted AVF- reclotted after thromb and revision, temp cath placed due to ^ K - had vein mapping; for new access with TDC-. Appreciate VVS help 3. ESRD/hyperkalemia resolved with HD TTS - K 4.2 - looks like she will not have surgery until Tuesday so we can dialyze today(Monday) which would be easier on her then next HD would be Wed or Thurs 4. Hypertension/volume- 1.7 UF Friday and 1.6 UF Sat with post weight not done - would have been about 44 based on pre weight - BP dramatically lower here suggests she is not taking BP meds at home; 5. Anemia-hgb 13.6 1/26down to 8.9 (last outpt hgb was 10.8)-on Aranesp 25 q 2 weeks last dose 1/23 - follow hgb for now 6. Metabolic bone disease- P 6.4 ^ fosrenol back to 2 ac outptdose - eating better Usually on 3.5 Ca bath - repeat labs in am - corrected Ca high enough to use 2.5 Ca bath instead 7. Nutrition- renal diet/vits-    Myriam Jacobson, PA-C [place] [phone] 11/19/2016,8:31 AM  LOS: 3 days   11/19/16 1115AM Pt seen and examined. Agree with PA A/P. Rt RCF thrombosed despite valiant attempt and now per Dr. Donzetta Matters will need vein mapping and a new access. Dr. Donzetta Matters to place TC this week. HD today to facilitate TC tomorrow by Dr. Donzetta Matters.  Otelia Santee, MD [facility]  Subjective:   Still some epigastric pain. Notes when eating grapes; not when drinking coffee.  [place] with doing HD today due to probable surgery on Tuesday. Ate all of eggs. Some of the  rest of her tray  Objective Vitals:   11/18/16 1713 11/18/16 2054 11/19/16 0504 11/19/16 0600  BP: 121/70 129/70 102/61   Pulse: 80 87 87   Resp: 18 18 18    Temp: 98.6 F (37 C) 98.2 F (36.8 C) 97.9 F (36.6 C)   TempSrc: Oral Oral Oral   SpO2: 99% 100% 97%   Weight:  46.1 kg (101 lb 10.1 oz)    Height:    5\' 4"  (1.626 m)   Physical Exam General: NAD -  Heart: RRR Lungs: no rales Abdomen: soft + BS - some epigastric tenderness Extremities: no LE edema Dialysis Access: right trialysis cath   Additional Objective Labs: Basic Metabolic Panel:  Recent Labs Lab 11/16/16 2359 11/17/16 0705 11/19/16 0505  NA 138 135 135  K 6.8* 4.7 4.2  CL 99* 98* 98*  CO2 19* 25 23  GLUCOSE 105* 103* 110*  BUN 129* 47* 57*  CREATININE 13.95* 7.72* 9.39*  CALCIUM 8.3* 8.3* 8.8*  PHOS  --   --  6.4*   Liver Function Tests:  Recent Labs Lab 11/16/16 0736 11/19/16 0505  AST 15  --   ALT 11*  --   ALKPHOS 64  --   BILITOT 0.4  --   PROT 5.8*  --   ALBUMIN 3.3* 2.3*    Recent Labs Lab 11/16/16 0736 11/18/16 1136  LIPASE  15  --   AMYLASE  --  145*   CBC:  Recent Labs Lab 11/16/16 0736 11/16/16 1526 11/19/16 0505  WBC 21.4*  --  6.4  NEUTROABS 17.1*  --   --   HGB 13.6 12.2 8.9*  HCT 40.1 36.0 26.9*  MCV 88.1  --  89.4  PLT 257  --  151     Recent Labs Lab 11/16/16 1655 11/16/16 1805  GLUCAP 90 181*  Medications:  . amLODipine  10 mg Oral QHS  . doxercalciferol  4 mcg Intravenous Q M,W,F-HD  . heparin  5,000 Units Subcutaneous Q8H  . labetalol  300 mg Oral BID  . lanthanum  1,000 mg Oral TID WC  . multivitamin  1 tablet Oral QHS  . pantoprazole (PROTONIX) IV  40 mg Intravenous Q12H  . sodium chloride flush  3 mL Intravenous Q12H

## 2016-11-19 NOTE — Progress Notes (Signed)
Subjective: Continues to feel better, with much improved abdominal pain and no nausea.  Thinks sweet foods still worsen her pain, but was able to eat some chicken yesterday.  Mild discomfort around her femoral catheter.  Objective:  Vital signs in last 24 hours: Vitals:   11/18/16 0956 11/18/16 1713 11/18/16 2054 11/19/16 0504  BP: 127/66 121/70 129/70 102/61  Pulse: 90 80 87 87  Resp: 18 18 18 18   Temp: 98.9 F (37.2 C) 98.6 F (37 C) 98.2 F (36.8 C) 97.9 F (36.6 C)  TempSrc: Oral Oral Oral Oral  SpO2: 100% 99% 100% 97%  Weight:   101 lb 10.1 oz (46.1 kg)    Physical Exam  Constitutional: She is oriented to person, place, and time. She appears well-developed and well-nourished. No distress.  HENT:  Head: Normocephalic and atraumatic.  Cardiovascular: Normal rate, regular rhythm and normal heart sounds.   Pulmonary/Chest: Effort normal and breath sounds normal.  Abdominal:  Soft, diffuse tenderness to palpation most over epigastrum  Neurological: She is alert and oriented to person, place, and time.  Psychiatric: She has a normal mood and affect. Her behavior is normal.    CBC Latest Ref Rng & Units 11/19/2016 11/16/2016 11/16/2016  WBC 4.0 - 10.5 K/uL 6.4 - 21.4(H)  Hemoglobin 12.0 - 15.0 g/dL 8.9(L) 12.2 13.6  Hematocrit 36.0 - 46.0 % 26.9(L) 36.0 40.1  Platelets 150 - 400 K/uL 151 - 257   CMP Latest Ref Rng & Units 11/19/2016 11/17/2016 11/16/2016  Glucose 65 - 99 mg/dL 110(H) 103(H) 105(H)  BUN 6 - 20 mg/dL 57(H) 47(H) 129(H)  Creatinine 0.44 - 1.00 mg/dL 9.39(H) 7.72(H) 13.95(H)  Sodium 135 - 145 mmol/L 135 135 138  Potassium 3.5 - 5.1 mmol/L 4.2 4.7 6.8(HH)  Chloride 101 - 111 mmol/L 98(L) 98(L) 99(L)  CO2 22 - 32 mmol/L 23 25 19(L)  Calcium 8.9 - 10.3 mg/dL 8.8(L) 8.3(L) 8.3(L)  Total Protein 6.5 - 8.1 g/dL - - -  Total Bilirubin 0.3 - 1.2 mg/dL - - -  Alkaline Phos 38 - 126 U/L - - -  AST 15 - 41 U/L - - -  ALT 14 - 54 U/L - - -     Assessment/Plan:  Principal Problem:   Acute pancreatitis Active Problems:   Glomerulonephritis   ESRD (end stage renal disease) (HCC)   Clotted renal dialysis AV graft (HCC)   Thrombosis of arteriovenous fistula (HCC)   Epigastric abdominal pain  31 y.o.femalewith ESRD and acute epigastric pain, nausea, and vomiting and also thrombosed AV fistula. She has a leukocytosis and CT findings suggestive of pancreatoduodenal edema but normal lipase. Overall, consistent with acute pancreatitis, but could represent PUD. Will treat with conservative management and PPI, with outpatient GI follow-up if she continues to improve.  VVS working on hemodialysis access, R femoral catheter placed on 1/26.  #Epigastric Pain Acute pancreatitis vs PUD.  Epigastric pain and CT findings of pancreatoduodenal edema suggest acute pancreatitis even with normal lipase and normal LFTs.  Her normal lipase and duodenal inflammation were discussed with GI Dr Collene Mares on 1/27, recommended continuing BID PPI and will perform EGD next week if symptoms do not improve. -Dilaudid, Zofran PRN -- she has been requiring only 2 doses of Dilaudid yesterday and none of Zofran -IV pantoprazole BID -Advance diet as tolerate  #ESRD -Renal following -HD per nephrology.  Current access is via right femoral catheter -Renal function panel in the morning  #Thrombosed AV Fistula Thrombosed R radiocephalic fistula,  femoral catheter place on 1/26 for dialysis access. -Hemodialysis access per VVS -Vein mapping ordered, plan for permanent access and tunneled HD cath next week pending results of mapping  #HTN Normotensive -Continue home amlodipine, labetalol  #Leukocytosis Resolved.  Likely related to her pancreatitis.  #Hyperkalemia Resolved with HD.  Fluids: none Diet:renal DVT Prophylaxis: heparin Code Status: full  Dispo: Anticipated discharge in approximately 2-3 day(s).   Minus Liberty, MD 11/19/2016,  6:39 AM Pager: [phone]

## 2016-11-20 LAB — RENAL FUNCTION PANEL
ALBUMIN: 2.5 g/dL — AB (ref 3.5–5.0)
Anion gap: 10 (ref 5–15)
BUN: 16 mg/dL (ref 6–20)
CALCIUM: 9 mg/dL (ref 8.9–10.3)
CO2: 27 mmol/L (ref 22–32)
CREATININE: 5.12 mg/dL — AB (ref 0.44–1.00)
Chloride: 101 mmol/L (ref 101–111)
GFR, EST AFRICAN AMERICAN: 12 mL/min — AB (ref >60)
GFR, EST NON AFRICAN AMERICAN: 11 mL/min — AB (ref >60)
Glucose, Bld: 91 mg/dL (ref 65–99)
Phosphorus: 5.6 mg/dL — ABNORMAL HIGH (ref 2.5–4.6)
Potassium: 4.2 mmol/L (ref 3.5–5.1)
SODIUM: 138 mmol/L (ref 135–145)

## 2016-11-20 LAB — PROTIME-INR
INR: 0.83
PROTHROMBIN TIME: 11.4 s (ref 11.4–15.2)

## 2016-11-20 LAB — APTT: APTT: 29 s (ref 24–36)

## 2016-11-20 MED ORDER — BOOST / RESOURCE BREEZE PO LIQD
1.0000 | Freq: Three times a day (TID) | ORAL | Status: DC
  Administered 2016-11-20 (×2): 237 mL via ORAL
  Administered 2016-11-20: 1 via ORAL
  Administered 2016-11-21: 30 via ORAL
  Administered 2016-11-21: 1 via ORAL
  Administered 2016-11-22: 30 via ORAL
  Administered 2016-11-23 – 2016-11-24 (×2): 1 via ORAL

## 2016-11-20 MED ORDER — PANTOPRAZOLE SODIUM 40 MG PO TBEC
40.0000 mg | DELAYED_RELEASE_TABLET | Freq: Two times a day (BID) | ORAL | Status: DC
  Administered 2016-11-20 – 2016-11-24 (×6): 40 mg via ORAL
  Filled 2016-11-20 (×6): qty 1

## 2016-11-20 NOTE — Progress Notes (Signed)
Interpreter Lesle Chris for [employer]

## 2016-11-20 NOTE — Progress Notes (Signed)
[facility] Progress Note    Dialysis Orders: TTS [place] 3.75 hours 350/A 1.5 2 K 3.5 Ca bath EDW 43.2 heparin 1700 Aranesp 25 q 2 weeks- 1/23 last dose, venofer 50/week, hectorol 4, right RC AVF Recent labs: hgb 10.8, P 5.3 (good for her) Ca 8.2 iPTH 602  Assessment/Plan: 1. Pancreatitis VS PUD- LFTs ok, CT findings c/w pancreatitis head of pancreas/ duodenal edema/ not clear why precipitated it; seems like sx have been occurring over the past month; IV PPI changed to po 2. Clotted AVF-reclotted after thromb and revision, temp cath placed due to ^ K - for new access with [facility] - surgery planned for Thursday 3. ESRD/hyperkalemia resolved with HD TTS -Had dialysis Monday due to surgery today - plan next HD Wed and then back on TTS schedule Saturday- K 4.2 on Monday 4. Hypertension/volume- 1.7 UF Friday, 1.6 UF Sat and 980 cc Monday with post weight 42.5  -BP dramatically lower here suggests she is not taking BP meds at home; 5. Anemia-hgb 13.6 1/26down to 8.9 (last outpt hgb was 10.8)-on Aranesp 25 q 2 weeks last dose 1/23 - follow hgb for now- if low with next repeat will resume ESA this week 6. Metabolic bone disease- P 5.6 ^ fosrenol back to 2 ac outptdose 1/29 - eating better Usually on 3.5 Ca bath - repeat labs in am - corrected Ca high enough to use 2.5 Ca bath instead- liekly will need to change bath at discharte 7. Nutrition - renal diet/vits alb 2.5 add supplement   Myriam Jacobson, PA-C [place] 11/20/2016,8:48 AM  LOS: 4 days    Patient seen and examined. Agree with PA A/P. Pt to go for conversion to a tunneled cath by Dr. Donzetta Matters tomorrow. No e/o infection in the groin and hopefully she'll have an IJ patent for a TC. She feels well and denies f/c/n/v. Tolerating PO's w/ a decent appetite and denies dyspnea or cp. Mild tenderness in the right forearm but no overt e/o infection.  Otelia Santee, MD [facility]   Subjective:   No c/o  no prob with HD yesterday  Objective Vitals:   11/19/16 1745 11/19/16 1817 11/19/16 2049 11/20/16 0708  BP: 132/84 132/79 (!) 136/96 104/61  Pulse: 91 94 93 88  Resp:  15 16 14   Temp:  98.7 F (37.1 C) 98.5 F (36.9 C) 98.1 F (36.7 C)  TempSrc:  Oral    SpO2:  100% 100% 100%  Weight:  42.5 kg (93 lb 11.1 oz) 42.5 kg (93 lb 11.1 oz)   Height:       Physical Exam General: NAD  Heart: RRR Lungs: no rales Abdomen: soft +BS Extremities: no LE edema Dialysis Access: temp right fem cath   Additional Objective Labs: Basic Metabolic Panel:  Recent Labs Lab 11/17/16 0705 11/19/16 0505 11/20/16 0650  NA 135 135 138  K 4.7 4.2 4.2  CL 98* 98* 101  CO2 25 23 27   GLUCOSE 103* 110* 91  BUN 47* 57* 16  CREATININE 7.72* 9.39* 5.12*  CALCIUM 8.3* 8.8* 9.0  PHOS  --  6.4* 5.6*   Liver Function Tests:  Recent Labs Lab 11/16/16 0736 11/19/16 0505 11/20/16 0650  AST 15  --   --   ALT 11*  --   --   ALKPHOS 64  --   --   BILITOT 0.4  --   --   PROT 5.8*  --   --   ALBUMIN 3.3*  2.3* 2.5*    Recent Labs Lab 11/16/16 0736 11/18/16 1136  LIPASE 15  --   AMYLASE  --  145*   CBC:  Recent Labs Lab 11/16/16 0736 11/16/16 1526 11/19/16 0505  WBC 21.4*  --  6.4  NEUTROABS 17.1*  --   --   HGB 13.6 12.2 8.9*  HCT 40.1 36.0 26.9*  MCV 88.1  --  89.4  PLT 257  --  151   CBG:  Recent Labs Lab 11/16/16 1655 11/16/16 1805  GLUCAP 90 181*  Medications:  . amLODipine  10 mg Oral QHS  . doxercalciferol  4 mcg Intravenous Q M,W,F-HD  . heparin  5,000 Units Subcutaneous Q8H  . labetalol  300 mg Oral BID  . lanthanum  2,000 mg Oral TID WC  . multivitamin  1 tablet Oral QHS  . pantoprazole  40 mg Oral BID  . sodium chloride flush  3 mL Intravenous Q12H

## 2016-11-20 NOTE — Progress Notes (Signed)
   Plan for [facility] and Right arm avf on Thursday. NPO past midnight on Wednesday. Continue to use right femoral temporary catheter for dialysis.   Muaaz Brau C. Donzetta Matters, MD [facility] [place] Office: [phone] Pager: [phone]

## 2016-11-20 NOTE — Progress Notes (Signed)
Subjective: Feels well, abdominal pain almost gone.  Ate a salad with ham for lunch and a sandwich for dinner yesterday without nausea or abdominal pain.  Understands plan for vascular surgery and TDC on Thursday.  Yesterday noticed more hair coming out when she ran her hands through her hair.  Objective:  Vital signs in last 24 hours: Vitals:   11/19/16 1745 11/19/16 1817 11/19/16 2049 11/20/16 0708  BP: 132/84 132/79 (!) 136/96 104/61  Pulse: 91 94 93 88  Resp:  15 16 14   Temp:  98.7 F (37.1 C) 98.5 F (36.9 C) 98.1 F (36.7 C)  TempSrc:  Oral    SpO2:  100% 100% 100%  Weight:  93 lb 11.1 oz (42.5 kg) 93 lb 11.1 oz (42.5 kg)   Height:       Physical Exam  Constitutional: She is oriented to person, place, and time. She appears well-developed and well-nourished. No distress.  HENT:  Head: Normocephalic and atraumatic.  Cardiovascular: Normal rate, regular rhythm and normal heart sounds.   Pulmonary/Chest: Effort normal and breath sounds normal.  Abdominal: Soft. She exhibits no distension. There is no tenderness.  Neurological: She is alert and oriented to person, place, and time.  Psychiatric: She has a normal mood and affect. Her behavior is normal.    CBC Latest Ref Rng & Units 11/19/2016 11/16/2016 11/16/2016  WBC 4.0 - 10.5 K/uL 6.4 - 21.4(H)  Hemoglobin 12.0 - 15.0 g/dL 8.9(L) 12.2 13.6  Hematocrit 36.0 - 46.0 % 26.9(L) 36.0 40.1  Platelets 150 - 400 K/uL 151 - 257   CMP Latest Ref Rng & Units 11/19/2016 11/17/2016 11/16/2016  Glucose 65 - 99 mg/dL 110(H) 103(H) 105(H)  BUN 6 - 20 mg/dL 57(H) 47(H) 129(H)  Creatinine 0.44 - 1.00 mg/dL 9.39(H) 7.72(H) 13.95(H)  Sodium 135 - 145 mmol/L 135 135 138  Potassium 3.5 - 5.1 mmol/L 4.2 4.7 6.8(HH)  Chloride 101 - 111 mmol/L 98(L) 98(L) 99(L)  CO2 22 - 32 mmol/L 23 25 19(L)  Calcium 8.9 - 10.3 mg/dL 8.8(L) 8.3(L) 8.3(L)  Total Protein 6.5 - 8.1 g/dL - - -  Total Bilirubin 0.3 - 1.2 mg/dL - - -  Alkaline Phos 38 - 126 U/L -  - -  AST 15 - 41 U/L - - -  ALT 14 - 54 U/L - - -    Assessment/Plan:  Principal Problem:   Acute pancreatitis Active Problems:   Glomerulonephritis   ESRD (end stage renal disease) (HCC)   Clotted renal dialysis AV graft (HCC)   Thrombosis of arteriovenous fistula (HCC)   Epigastric abdominal pain  31 y.o.femalewith ESRD and acute epigastric pain, nausea, and vomiting and also thrombosed AV fistula. She has a leukocytosis and CT findings suggestive of pancreatoduodenal edema but normal lipase. Overall, consistent with acute pancreatitis, but could represent PUD. Will treat with conservative management and PPI, with outpatient GI follow-up if she continues to improve.  VVS working on hemodialysis access, R femoral catheter placed on 1/26.  #Epigastric Pain Resolved. Acute pancreatitis vs PUD.  Epigastric pain and CT findings of pancreatoduodenal edema suggest acute pancreatitis even with normal lipase and normal LFTs.  Her normal lipase and duodenal inflammation were discussed with GI Dr Collene Mares on 1/27, recommended continuing BID PPI and will perform EGD next week if symptoms do not improve. -Convert PPI to pantoprazole 40 mg BID PO -Advance diet as tolerate  #ESRD -Renal following -HD per nephrology.  Current access is via right femoral catheter -Renal function  panel in the morning  #Thrombosed AV Fistula Thrombosed R radiocephalic fistula, femoral catheter place on 1/26 for dialysis access.   -TDC and right AVF by VVS planned for Thurs 2/1.  #HTN Normotensive -Continue home amlodipine, labetalol  #Leukocytosis Resolved.  Likely related to her pancreatitis.  #Hyperkalemia Resolved with HD.  Fluids: none Diet:renal DVT Prophylaxis: heparin Code Status: full  Dispo: Anticipated discharge in approximately 2-3 day(s).   Minus Liberty, MD 11/20/2016, 7:23 AM Pager: [phone]

## 2016-11-21 DIAGNOSIS — K859 Acute pancreatitis without necrosis or infection, unspecified: Secondary | ICD-10-CM

## 2016-11-21 DIAGNOSIS — Y712 Prosthetic and other implants, materials and accessory cardiovascular devices associated with adverse incidents: Secondary | ICD-10-CM

## 2016-11-21 DIAGNOSIS — Z79899 Other long term (current) drug therapy: Secondary | ICD-10-CM

## 2016-11-21 DIAGNOSIS — I120 Hypertensive chronic kidney disease with stage 5 chronic kidney disease or end stage renal disease: Secondary | ICD-10-CM

## 2016-11-21 DIAGNOSIS — T82868D Thrombosis of vascular prosthetic devices, implants and grafts, subsequent encounter: Secondary | ICD-10-CM

## 2016-11-21 DIAGNOSIS — N186 End stage renal disease: Secondary | ICD-10-CM

## 2016-11-21 LAB — CBC
HCT: 26.7 % — ABNORMAL LOW (ref 36.0–46.0)
Hemoglobin: 8.5 g/dL — ABNORMAL LOW (ref 12.0–15.0)
MCH: 29.2 pg (ref 26.0–34.0)
MCHC: 31.8 g/dL (ref 30.0–36.0)
MCV: 91.8 fL (ref 78.0–100.0)
PLATELETS: 184 10*3/uL (ref 150–400)
RBC: 2.91 MIL/uL — ABNORMAL LOW (ref 3.87–5.11)
RDW: 14.2 % (ref 11.5–15.5)
WBC: 6.9 10*3/uL (ref 4.0–10.5)

## 2016-11-21 LAB — RENAL FUNCTION PANEL
Albumin: 2.4 g/dL — ABNORMAL LOW (ref 3.5–5.0)
Anion gap: 11 (ref 5–15)
BUN: 45 mg/dL — AB (ref 6–20)
CHLORIDE: 98 mmol/L — AB (ref 101–111)
CO2: 26 mmol/L (ref 22–32)
CREATININE: 8.25 mg/dL — AB (ref 0.44–1.00)
Calcium: 9.5 mg/dL (ref 8.9–10.3)
GFR calc Af Amer: 7 mL/min — ABNORMAL LOW (ref >60)
GFR calc non Af Amer: 6 mL/min — ABNORMAL LOW (ref >60)
GLUCOSE: 94 mg/dL (ref 65–99)
Phosphorus: 6.3 mg/dL — ABNORMAL HIGH (ref 2.5–4.6)
Potassium: 4.3 mmol/L (ref 3.5–5.1)
Sodium: 135 mmol/L (ref 135–145)

## 2016-11-21 LAB — IRON AND TIBC
Iron: 39 ug/dL (ref 28–170)
Saturation Ratios: 17 % (ref 10.4–31.8)
TIBC: 230 ug/dL — ABNORMAL LOW (ref 250–450)
UIBC: 191 ug/dL

## 2016-11-21 LAB — GLUCOSE, CAPILLARY: GLUCOSE-CAPILLARY: 106 mg/dL — AB (ref 65–99)

## 2016-11-21 MED ORDER — DARBEPOETIN ALFA 40 MCG/0.4ML IJ SOSY
PREFILLED_SYRINGE | INTRAMUSCULAR | Status: AC
  Filled 2016-11-21: qty 0.4

## 2016-11-21 MED ORDER — DARBEPOETIN ALFA 40 MCG/0.4ML IJ SOSY
40.0000 ug | PREFILLED_SYRINGE | Freq: Once | INTRAMUSCULAR | Status: AC
  Administered 2016-11-21: 40 ug via INTRAVENOUS

## 2016-11-21 MED ORDER — DOXERCALCIFEROL 4 MCG/2ML IV SOLN
INTRAVENOUS | Status: AC
  Filled 2016-11-21: qty 2

## 2016-11-21 NOTE — Progress Notes (Signed)
Subjective: Feels well, abdominal pain is still better than admission but present.  She has been able to eat normally without nausea or vomiting, mild worsening of her abdominal pain.  Objective:  Vital signs in last 24 hours: Vitals:   11/21/16 0900 11/21/16 0930 11/21/16 1000 11/21/16 1030  BP: 111/66 110/66 124/65 135/81  Pulse: 96 96 (!) 101 93  Resp: 20   18  Temp:      TempSrc:      SpO2:      Weight:      Height:       Physical Exam  Constitutional: She is oriented to person, place, and time. She appears well-developed and well-nourished. No distress.  Cardiovascular: Normal rate and regular rhythm.   Pulmonary/Chest: Effort normal and breath sounds normal.  Abdominal:  Soft, no distension Mild diffuse tenderness to palpation, most over epigastrum  Neurological: She is alert and oriented to person, place, and time.  Psychiatric: She has a normal mood and affect. Her behavior is normal.    CBC Latest Ref Rng & Units 11/21/2016 11/19/2016 11/16/2016  WBC 4.0 - 10.5 K/uL 6.9 6.4 -  Hemoglobin 12.0 - 15.0 g/dL 8.5(L) 8.9(L) 12.2  Hematocrit 36.0 - 46.0 % 26.7(L) 26.9(L) 36.0  Platelets 150 - 400 K/uL 184 151 -   CMP Latest Ref Rng & Units 11/21/2016 11/20/2016 11/19/2016  Glucose 65 - 99 mg/dL 94 91 110(H)  BUN 6 - 20 mg/dL 45(H) 16 57(H)  Creatinine 0.44 - 1.00 mg/dL 8.25(H) 5.12(H) 9.39(H)  Sodium 135 - 145 mmol/L 135 138 135  Potassium 3.5 - 5.1 mmol/L 4.3 4.2 4.2  Chloride 101 - 111 mmol/L 98(L) 101 98(L)  CO2 22 - 32 mmol/L 26 27 23   Calcium 8.9 - 10.3 mg/dL 9.5 9.0 8.8(L)  Total Protein 6.5 - 8.1 g/dL - - -  Total Bilirubin 0.3 - 1.2 mg/dL - - -  Alkaline Phos 38 - 126 U/L - - -  AST 15 - 41 U/L - - -  ALT 14 - 54 U/L - - -    Assessment/Plan:  Principal Problem:   Acute pancreatitis Active Problems:   Glomerulonephritis   ESRD (end stage renal disease) (HCC)   Clotted renal dialysis AV graft (HCC)   Thrombosis of arteriovenous fistula (HCC)  Epigastric abdominal pain  31 y.o.femalewith ESRD and acute epigastric pain, nausea, and vomiting and also thrombosed AV fistula. She has a leukocytosis and CT findings suggestive of pancreatoduodenal edema but normal lipase. Overall, consistent with acute pancreatitis, but could represent PUD. Will treat with conservative management and PPI, with outpatient GI follow-up if she continues to improve.  VVS working on hemodialysis access, R femoral catheter placed on 1/26.  #Epigastric Pain Improved. Acute pancreatitis vs PUD.  Epigastric pain and CT findings of pancreatoduodenal edema suggest acute pancreatitis even with normal lipase and normal LFTs. -Continue pantoprazole 40 mg BID PO -Advance diet as tolerate -Discussed w/ GI Dr Collene Mares, consider HIDA scan w/ CCK  #ESRD -Renal following -HD per nephrology.  Current access is via right femoral catheter -Renal function panel in the morning  #Thrombosed AV Fistula Thrombosed R radiocephalic fistula, femoral catheter place on 1/26 for dialysis access.   -TDC and right AVF by VVS planned for Thurs 2/1.  #HTN Normotensive -Continue home amlodipine, labetalol  #Leukocytosis Resolved.  Likely related to her pancreatitis.  #Hyperkalemia Resolved with HD.  Fluids: none Diet:renal DVT Prophylaxis: heparin Code Status: full  Dispo: Anticipated discharge in approximately 2-3 day(s).  Minus Liberty, MD 11/21/2016, 11:08 AM Pager: [phone]

## 2016-11-21 NOTE — Progress Notes (Signed)
[facility] Progress Note  Dialysis Orders: TTS [place] 3.75 hours 350/A 1.5 2 K 3.5 Ca bath EDW 43.2 heparin 1700 Aranesp 25 q 2 weeks- 1/23 last dose, venofer 50/week, hectorol 4, right RC AVF Recent labs: hgb 10.8, P 5.3 (good for her) Ca 8.2 iPTH 602  Assessment/Plan: 1. Pancreatitis VS PUD- LFTs ok, CT findings c/w pancreatitis head of pancreas/ duodenal edema/ not clear why precipitated it; seems like sx have been occurring over the past month; IV PPI changed to po- sx resolved 2. Clotted AVF-reclotted after thromb and revision, temp cath placed due to ^ K - for new access with [facility] - surgery planned for Thursday 3. ESRD/hyperkalemia resolved with HD TTS -Had dialysis Monday due to surgery today - plan next HD Wed and then back on TTS schedule Saturday- K 4.2 on Monday- today's labs pending 4. Hypertension/volume- 1.7 UF Friday, 1.6 UF Sat and 980 cc Monday with post weight 42.5  -BP continues to be  dramatically lower here suggests she is not taking BP meds at home; 5. Anemia-hgb 13.6 1/26down to 8.5 Wed (last outpt hgb was 10.8)-on Aranesp 25 q 2 weeks last dose 1/23 - plan ARanesp 40 today and check Fe stores 6. Metabolic bone disease- P 5.6 ^ fosrenol back to 2 ac outptdose 1/29 - eating betterUsually on 3.5 Ca bath - repeat labs in am - corrected Ca high enough to use 2.5 Ca bath instead- liekly will need to change bath at discharte 7. Nutrition - renal diet/vits alb 2.5 add supplement 8. Disp - hopefully home after surgery tomorrow    Myriam Jacobson, PA-C [place] [phone] 11/21/2016,8:22 AM  LOS: 5 days   Patient seen on dialysis. 3/2.5 bath  Qb/qd 350/600 w/ UF 2.2L VS 104/79  Agree with PA A/P.  Otelia Santee, MD CKA  Subjective:   No c/o; no abd pain  Objective Vitals:   11/21/16 0720 11/21/16 0722 11/21/16 0728 11/21/16 0800  BP: 121/71 119/72 121/71 124/67  Pulse: 88  95 100  Resp: 16  16 15   Temp: 98.1 F  (36.7 C)     TempSrc: Oral     SpO2:  98%    Weight: 43.7 kg (96 lb 5.5 oz)     Height:       Physical Exam General:NAD on HD Heart: RR tacy ~100 Lungs: no rales Abdomen: soft NT Extremities: no LE edema Dialysis Access: right fem cath - swelling right lower arm resolved (old AVF site)   Additional Objective Labs: Basic Metabolic Panel:  Recent Labs Lab 11/17/16 0705 11/19/16 0505 11/20/16 0650  NA 135 135 138  K 4.7 4.2 4.2  CL 98* 98* 101  CO2 25 23 27   GLUCOSE 103* 110* 91  BUN 47* 57* 16  CREATININE 7.72* 9.39* 5.12*  CALCIUM 8.3* 8.8* 9.0  PHOS  --  6.4* 5.6*   Liver Function Tests:  Recent Labs Lab 11/16/16 0736 11/19/16 0505 11/20/16 0650  AST 15  --   --   ALT 11*  --   --   ALKPHOS 64  --   --   BILITOT 0.4  --   --   PROT 5.8*  --   --   ALBUMIN 3.3* 2.3* 2.5*    Recent Labs Lab 11/16/16 0736 11/18/16 1136  LIPASE 15  --   AMYLASE  --  145*   CBC:  Recent Labs Lab 11/16/16 0736 11/16/16 1526 11/19/16 0505 11/21/16 0745  WBC 21.4*  --  6.4 6.9  NEUTROABS 17.1*  --   --   --   HGB 13.6 12.2 8.9* 8.5*  HCT 40.1 36.0 26.9* 26.7*  MCV 88.1  --  89.4 91.8  PLT 257  --  151 184   CBG:  Recent Labs Lab 11/16/16 1655 11/16/16 1805  GLUCAP 90 181*  Medications:  . amLODipine  10 mg Oral QHS  . darbepoetin (ARANESP) injection - DIALYSIS  40 mcg Intravenous Once in dialysis  . doxercalciferol  4 mcg Intravenous Q M,W,F-HD  . feeding supplement  1 Container Oral TID BM  . heparin  5,000 Units Subcutaneous Q8H  . labetalol  300 mg Oral BID  . lanthanum  2,000 mg Oral TID WC  . multivitamin  1 tablet Oral QHS  . pantoprazole  40 mg Oral BID  . sodium chloride flush  3 mL Intravenous Q12H

## 2016-11-21 NOTE — Progress Notes (Signed)
   Plan for right arm av fistula vs graft tomorrow and tunneled dialysis catheter. NPO past midnight.   Dareion Kneece C. Donzetta Matters, MD [facility] Office: [phone] Pager: [phone]

## 2016-11-22 ENCOUNTER — Encounter (HOSPITAL_COMMUNITY)
Admission: EM | Disposition: A | Discharge status: Home / Self Care | Payer: Self-pay | Source: Home / Self Care | Attending: Internal Medicine

## 2016-11-22 ENCOUNTER — Inpatient Hospital Stay (HOSPITAL_COMMUNITY): Payer: Medicaid Other

## 2016-11-22 ENCOUNTER — Inpatient Hospital Stay (HOSPITAL_COMMUNITY): Payer: Medicaid Other | Admitting: Anesthesiology

## 2016-11-22 DIAGNOSIS — N185 Chronic kidney disease, stage 5: Secondary | ICD-10-CM

## 2016-11-22 DIAGNOSIS — K869 Disease of pancreas, unspecified: Secondary | ICD-10-CM

## 2016-11-22 DIAGNOSIS — K279 Peptic ulcer, site unspecified, unspecified as acute or chronic, without hemorrhage or perforation: Secondary | ICD-10-CM

## 2016-11-22 HISTORY — PX: AV FISTULA PLACEMENT: SHX1204

## 2016-11-22 HISTORY — PX: INSERTION OF DIALYSIS CATHETER: SHX1324

## 2016-11-22 LAB — POCT I-STAT 4, (NA,K, GLUC, HGB,HCT)
Glucose, Bld: 94 mg/dL (ref 65–99)
HEMATOCRIT: 29 % — AB (ref 36.0–46.0)
HEMOGLOBIN: 9.9 g/dL — AB (ref 12.0–15.0)
Potassium: 4.9 mmol/L (ref 3.5–5.1)
Sodium: 132 mmol/L — ABNORMAL LOW (ref 135–145)

## 2016-11-22 SURGERY — ARTERIOVENOUS (AV) FISTULA CREATION
Anesthesia: General | Site: Chest | Laterality: Right

## 2016-11-22 MED ORDER — PROPOFOL 10 MG/ML IV BOLUS
INTRAVENOUS | Status: DC | PRN
  Administered 2016-11-22: 100 mg via INTRAVENOUS
  Administered 2016-11-22 (×2): 50 mg via INTRAVENOUS

## 2016-11-22 MED ORDER — FENTANYL CITRATE (PF) 100 MCG/2ML IJ SOLN
INTRAMUSCULAR | Status: DC | PRN
  Administered 2016-11-22 (×2): 50 ug via INTRAVENOUS
  Administered 2016-11-22: 25 ug via INTRAVENOUS

## 2016-11-22 MED ORDER — ONDANSETRON HCL 4 MG/2ML IJ SOLN
INTRAMUSCULAR | Status: AC
  Filled 2016-11-22: qty 2

## 2016-11-22 MED ORDER — PHENYLEPHRINE HCL 10 MG/ML IJ SOLN
INTRAMUSCULAR | Status: DC | PRN
  Administered 2016-11-22: 80 ug via INTRAVENOUS
  Administered 2016-11-22 (×2): 120 ug via INTRAVENOUS

## 2016-11-22 MED ORDER — CEFAZOLIN SODIUM-DEXTROSE 2-4 GM/100ML-% IV SOLN
2.0000 g | Freq: Once | INTRAVENOUS | Status: AC
  Administered 2016-11-22: 2 g via INTRAVENOUS
  Filled 2016-11-22 (×2): qty 100

## 2016-11-22 MED ORDER — MIDAZOLAM HCL 2 MG/2ML IJ SOLN
INTRAMUSCULAR | Status: AC
  Filled 2016-11-22: qty 2

## 2016-11-22 MED ORDER — PROPOFOL 10 MG/ML IV BOLUS
INTRAVENOUS | Status: AC
  Filled 2016-11-22: qty 20

## 2016-11-22 MED ORDER — SODIUM CHLORIDE 0.9 % IV SOLN
INTRAVENOUS | Status: DC
Start: 2016-11-22 — End: 2016-11-22
  Administered 2016-11-22 (×3): via INTRAVENOUS

## 2016-11-22 MED ORDER — 0.9 % SODIUM CHLORIDE (POUR BTL) OPTIME
TOPICAL | Status: DC | PRN
  Administered 2016-11-22: 1000 mL

## 2016-11-22 MED ORDER — HEPARIN SODIUM (PORCINE) 5000 UNIT/ML IJ SOLN
5000.0000 [IU] | Freq: Three times a day (TID) | INTRAMUSCULAR | Status: DC
  Administered 2016-11-22 – 2016-11-23 (×2): 5000 [IU] via SUBCUTANEOUS
  Filled 2016-11-22 (×3): qty 1

## 2016-11-22 MED ORDER — PROMETHAZINE HCL 25 MG/ML IJ SOLN: 6.2500 mg | INTRAMUSCULAR | Status: DC | PRN

## 2016-11-22 MED ORDER — FENTANYL CITRATE (PF) 100 MCG/2ML IJ SOLN
25.0000 ug | INTRAMUSCULAR | Status: DC | PRN
  Administered 2016-11-22 (×2): 25 ug via INTRAVENOUS

## 2016-11-22 MED ORDER — OXYCODONE-ACETAMINOPHEN 5-325 MG PO TABS
1.0000 | ORAL_TABLET | ORAL | Status: DC | PRN
  Administered 2016-11-22 – 2016-11-24 (×3): 2 via ORAL
  Filled 2016-11-22 (×3): qty 2

## 2016-11-22 MED ORDER — FENTANYL CITRATE (PF) 100 MCG/2ML IJ SOLN
INTRAMUSCULAR | Status: AC
  Filled 2016-11-22: qty 4

## 2016-11-22 MED ORDER — DEXTROSE 5 % IV SOLN
INTRAVENOUS | Status: DC | PRN
  Administered 2016-11-22: 40 ug/min via INTRAVENOUS

## 2016-11-22 MED ORDER — HEPARIN SODIUM (PORCINE) 5000 UNIT/ML IJ SOLN
INTRAMUSCULAR | Status: DC | PRN
  Administered 2016-11-22: 12:00:00

## 2016-11-22 MED ORDER — LIDOCAINE-EPINEPHRINE (PF) 1 %-1:200000 IJ SOLN
INTRAMUSCULAR | Status: AC
  Filled 2016-11-22: qty 30

## 2016-11-22 MED ORDER — FENTANYL CITRATE (PF) 100 MCG/2ML IJ SOLN
INTRAMUSCULAR | Status: AC
  Filled 2016-11-22: qty 2

## 2016-11-22 MED ORDER — ONDANSETRON HCL 4 MG/2ML IJ SOLN
INTRAMUSCULAR | Status: DC | PRN
  Administered 2016-11-22: 4 mg via INTRAVENOUS

## 2016-11-22 MED ORDER — WHITE PETROLATUM GEL
Status: AC
  Administered 2016-11-22: 16:00:00
  Filled 2016-11-22: qty 1

## 2016-11-22 MED ORDER — EPHEDRINE SULFATE 50 MG/ML IJ SOLN
INTRAMUSCULAR | Status: DC | PRN
Start: 2016-11-22 — End: 2016-11-22
  Administered 2016-11-22 (×2): 10 mg via INTRAVENOUS

## 2016-11-22 MED ORDER — MIDAZOLAM HCL 5 MG/5ML IJ SOLN
INTRAMUSCULAR | Status: DC | PRN
  Administered 2016-11-22: 2 mg via INTRAVENOUS

## 2016-11-22 MED ORDER — HEPARIN SODIUM (PORCINE) 1000 UNIT/ML IJ SOLN
INTRAMUSCULAR | Status: DC | PRN
  Administered 2016-11-22: 1000 [IU]

## 2016-11-22 MED ORDER — IOPAMIDOL (ISOVUE-300) INJECTION 61%
INTRAVENOUS | Status: AC
  Filled 2016-11-22: qty 50

## 2016-11-22 MED ORDER — OXYCODONE-ACETAMINOPHEN 5-325 MG PO TABS
1.0000 | ORAL_TABLET | Freq: Four times a day (QID) | ORAL | Status: DC | PRN
  Administered 2016-11-22: 1 via ORAL
  Filled 2016-11-22: qty 1

## 2016-11-22 MED ORDER — HEPARIN SODIUM (PORCINE) 1000 UNIT/ML IJ SOLN
INTRAMUSCULAR | Status: AC
  Filled 2016-11-22: qty 1

## 2016-11-22 SURGICAL SUPPLY — 50 items
ARMBAND PINK RESTRICT EXTREMIT (MISCELLANEOUS) ×4 IMPLANT
BIOPATCH RED 1 DISK 7.0 (GAUZE/BANDAGES/DRESSINGS) ×3 IMPLANT
BIOPATCH RED 1IN DISK 7.0MM (GAUZE/BANDAGES/DRESSINGS) ×1
CANISTER SUCTION 2500CC (MISCELLANEOUS) ×4 IMPLANT
CATH PALINDROME RT-P 15FX19CM (CATHETERS) ×4 IMPLANT
CLIP TI MEDIUM 6 (CLIP) ×4 IMPLANT
CLIP TI WIDE RED SMALL 6 (CLIP) ×4 IMPLANT
COVER MAYO STAND STRL (DRAPES) ×4 IMPLANT
COVER PROBE W GEL 5X96 (DRAPES) ×4 IMPLANT
COVER SURGICAL LIGHT HANDLE (MISCELLANEOUS) ×4 IMPLANT
DERMABOND ADVANCED (GAUZE/BANDAGES/DRESSINGS) ×2
DERMABOND ADVANCED .7 DNX12 (GAUZE/BANDAGES/DRESSINGS) ×2 IMPLANT
DEVICE TORQUE H2O (MISCELLANEOUS) ×4 IMPLANT
DRAPE C-ARM 42X72 X-RAY (DRAPES) ×4 IMPLANT
DRAPE CHEST BREAST 15X10 FENES (DRAPES) ×4 IMPLANT
ELECT REM PT RETURN 9FT ADLT (ELECTROSURGICAL) ×4
ELECTRODE REM PT RTRN 9FT ADLT (ELECTROSURGICAL) ×2 IMPLANT
GAUZE SPONGE 4X4 16PLY XRAY LF (GAUZE/BANDAGES/DRESSINGS) ×4 IMPLANT
GLOVE BIO SURGEON STRL SZ 6.5 (GLOVE) ×6 IMPLANT
GLOVE BIO SURGEON STRL SZ7.5 (GLOVE) ×12 IMPLANT
GLOVE BIO SURGEONS STRL SZ 6.5 (GLOVE) ×2
GLOVE BIOGEL PI IND STRL 6.5 (GLOVE) ×8 IMPLANT
GLOVE BIOGEL PI INDICATOR 6.5 (GLOVE) ×8
GLOVE SURG SS PI 7.5 STRL IVOR (GLOVE) ×4 IMPLANT
GOWN STRL NON-REIN LRG LVL3 (GOWN DISPOSABLE) ×4 IMPLANT
GOWN STRL REUS W/ TWL LRG LVL3 (GOWN DISPOSABLE) ×6 IMPLANT
GOWN STRL REUS W/ TWL XL LVL3 (GOWN DISPOSABLE) ×2 IMPLANT
GOWN STRL REUS W/TWL LRG LVL3 (GOWN DISPOSABLE) ×6
GOWN STRL REUS W/TWL XL LVL3 (GOWN DISPOSABLE) ×2
GUIDEWIRE ANGLED .035X150CM (WIRE) ×4 IMPLANT
KIT BASIN OR (CUSTOM PROCEDURE TRAY) ×4 IMPLANT
KIT ROOM TURNOVER OR (KITS) ×4 IMPLANT
LIQUID BAND (GAUZE/BANDAGES/DRESSINGS) ×4 IMPLANT
NEEDLE 18GX1X1/2 (RX/OR ONLY) (NEEDLE) ×4 IMPLANT
NS IRRIG 1000ML POUR BTL (IV SOLUTION) ×4 IMPLANT
PACK CV ACCESS (CUSTOM PROCEDURE TRAY) ×4 IMPLANT
PAD ARMBOARD 7.5X6 YLW CONV (MISCELLANEOUS) ×8 IMPLANT
SET MICROPUNCTURE 5F STIFF (MISCELLANEOUS) ×4 IMPLANT
SOAP 2 % CHG 4 OZ (WOUND CARE) ×4 IMPLANT
SUT ETHILON 3 0 PS 1 (SUTURE) ×4 IMPLANT
SUT MNCRL AB 4-0 PS2 18 (SUTURE) ×4 IMPLANT
SUT PROLENE 6 0 BV (SUTURE) ×4 IMPLANT
SUT VIC AB 3-0 SH 27 (SUTURE) ×2
SUT VIC AB 3-0 SH 27X BRD (SUTURE) ×2 IMPLANT
SYR 20CC LL (SYRINGE) ×4 IMPLANT
SYR 5ML LL (SYRINGE) ×4 IMPLANT
SYRINGE 10CC LL (SYRINGE) ×4 IMPLANT
TOWEL OR 17X26 10 PK STRL BLUE (TOWEL DISPOSABLE) ×4 IMPLANT
UNDERPAD 30X30 (UNDERPADS AND DIAPERS) ×4 IMPLANT
WATER STERILE IRR 1000ML POUR (IV SOLUTION) ×4 IMPLANT

## 2016-11-22 NOTE — Transfer of Care (Signed)
Immediate Anesthesia Transfer of Care Note  Patient: Karla Garner  Procedure(s) Performed: Procedure(s): ARTERIOVENOUS (AV) FISTULA CREATION (Right) INSERTION OF DIALYSIS CATHETER - LEFT INTERNAL JUGULAR PLACEMENT (Left)  Patient Location: PACU  Anesthesia Type:General  Level of Consciousness: awake, alert , oriented and patient cooperative  Airway & Oxygen Therapy: Patient Spontanous Breathing and Patient connected to nasal cannula oxygen  Post-op Assessment: Report given to RN, Post -op Vital signs reviewed and stable and Patient moving all extremities X 4  Post vital signs: Reviewed and stable  Last Vitals:  Vitals:   11/22/16 0818 11/22/16 1322  BP: (!) 143/88   Pulse: 88   Resp: 17   Temp: 36.9 C (P) 36.4 C    Last Pain:  Vitals:   11/22/16 0818  TempSrc: Oral  PainSc:       Patients Stated Pain Goal: 4 (32/99/24 2683)  Complications: No apparent anesthesia complications

## 2016-11-22 NOTE — Plan of Care (Signed)
Problem: Tissue Perfusion: Goal: Risk factors for ineffective tissue perfusion will decrease Outcome: Completed/Met Date Met: 11/22/16 Graft placement

## 2016-11-22 NOTE — Progress Notes (Signed)
  Progress Note    11/22/2016 10:44 AM Day of Surgery  Subjective:  No acute issues  Vitals:   11/22/16 0508 11/22/16 0818  BP: 123/67 (!) 143/88  Pulse: 81 88  Resp: 18 17  Temp: 98.3 F (36.8 C) 98.5 F (36.9 C)    Physical Exam: [email or web address] Incisions on rue are cdi Palpable r radial pulse  CBC    Component Value Date/Time   WBC 6.9 11/21/2016 0745   RBC 2.91 (L) 11/21/2016 0745   HGB 9.9 (L) 11/22/2016 0904   HCT 29.0 (L) 11/22/2016 0904   PLT 184 11/21/2016 0745   MCV 91.8 11/21/2016 0745   MCH 29.2 11/21/2016 0745   MCHC 31.8 11/21/2016 0745   RDW 14.2 11/21/2016 0745   LYMPHSABS 2.8 11/16/2016 0736   MONOABS 1.1 (H) 11/16/2016 0736   EOSABS 0.4 11/16/2016 0736   BASOSABS 0.0 11/16/2016 0736    BMET    Component Value Date/Time   NA 132 (L) 11/22/2016 0904   K 4.9 11/22/2016 0904   CL 98 (L) 11/21/2016 0745   CO2 26 11/21/2016 0745   GLUCOSE 94 11/22/2016 0904   BUN 45 (H) 11/21/2016 0745   CREATININE 8.25 (H) 11/21/2016 0745   CREATININE 3.74 (H) 02/16/2013 1221   CALCIUM 9.5 11/21/2016 0745   CALCIUM 8.2 (L) 04/11/2015 1508   GFRNONAA 6 (L) 11/21/2016 0745   GFRAA 7 (L) 11/21/2016 0745    INR    Component Value Date/Time   INR 0.83 11/20/2016 1332     Intake/Output Summary (Last 24 hours) at 11/22/16 1044 Last data filed at 11/21/16 1644  Gross per 24 hour  Intake              360 ml  Output             1200 ml  Net             -840 ml     Assessment:  31 y.o. female is here with thrombosed right arm avf  Plan: OR today for tdc placement and right arm fistula vs graft Discussed risks and benefits   Brandon C. Donzetta Matters, MD [facility] [place] Office: [phone] Pager: [phone]  11/22/2016 10:44 AM

## 2016-11-22 NOTE — Discharge Instructions (Signed)
° ° °  11/22/2016 Karla Garner [id number] November 03, 1985  Surgeon(s): [name], MD  Procedure(s): ARTERIOVENOUS (AV) FISTULA CREATION INSERTION OF DIALYSIS CATHETER - LEFT INTERNAL JUGULAR PLACEMENT  x Do not stick fistula for 12 weeks

## 2016-11-22 NOTE — Anesthesia Preprocedure Evaluation (Addendum)
Anesthesia Evaluation  Patient identified by MRN, date of birth, ID band Patient awake    Reviewed: Allergy & Precautions, NPO status , Patient's Chart, lab work & pertinent test results, reviewed documented beta blocker date and time   History of Anesthesia Complications (+) PONV  Airway Mallampati: II  TM Distance: >3 FB Neck ROM: Full    Dental  (+) Dental Advisory Given, Teeth Intact   Pulmonary neg pulmonary ROS,    breath sounds clear to auscultation       Cardiovascular hypertension, Pt. on medications and Pt. on home beta blockers  Rhythm:Regular Rate:Normal     Neuro/Psych Depression negative neurological ROS     GI/Hepatic negative GI ROS, Neg liver ROS,   Endo/Other    Renal/GU ESRFRenal disease     Musculoskeletal   Abdominal   Peds  Hematology  (+) anemia ,   Anesthesia Other Findings   Reproductive/Obstetrics                           Lab Results  Component Value Date   WBC 6.9 11/21/2016   HGB 8.5 (L) 11/21/2016   HCT 26.7 (L) 11/21/2016   MCV 91.8 11/21/2016   PLT 184 11/21/2016   Lab Results  Component Value Date   CREATININE 8.25 (H) 11/21/2016   BUN 45 (H) 11/21/2016   NA 135 11/21/2016   K 4.3 11/21/2016   CL 98 (L) 11/21/2016   CO2 26 11/21/2016    Anesthesia Physical Anesthesia Plan  ASA: III  Anesthesia Plan: General   Post-op Pain Management:    Induction: Intravenous  Airway Management Planned: LMA  Additional Equipment:   Intra-op Plan:   Post-operative Plan:   Informed Consent: I have reviewed the patients History and Physical, chart, labs and discussed the procedure including the risks, benefits and alternatives for the proposed anesthesia with the patient or authorized representative who has indicated his/her understanding and acceptance.   Dental advisory given  Plan Discussed with: CRNA, Anesthesiologist and Surgeon  Anesthesia  Plan Comments:       Anesthesia Quick Evaluation

## 2016-11-22 NOTE — Progress Notes (Signed)
Interpreter notified [phone] to explain the surgical procedure to pt of Graft or fistula placement with dialysis catheter placement,pt verbalized being NPO after midnight for surgery, consent form signed in chart will continue to monitor

## 2016-11-22 NOTE — Progress Notes (Signed)
[facility] Progress Note  Dialysis Orders: TTS [place] 3.75 hours 350/A 1.5 2 K 3.5 Ca bath EDW 43.2 heparin 1700 Aranesp 25 q 2 weeks- 1/23 last dose, venofer 50/week, hectorol 4, right RC AVF Recent labs: hgb 10.8, P 5.3 (good for her) Ca 8.2 iPTH 602  Assessment/Plan: 1. Pancreatitis VS PUD- LFTs ok, CT findings c/w pancreatitis head of pancreas/ duodenal edema/ not clear why precipitated it; seems like sx have been occurring over the past month; IV PPI changed to po- sx resolved 2. Clotted AVF-reclotted after thromb and revision, temp cath placed due to ^ K - 2/1 Dr. Bridgett Larsson placed left IJ and right BC AVF; Degree of distress from discomfort from new access seems disproportionate to surgery. 3. ESRD/hyperkalemia resolved with HD TTS ^ K resolved with HD had HD Mon and Wed - next HD Satuday at her outpt unit 4.  Hypertension/volume- 1.7 UF Friday,1.6 UF Sat and 980 cc Monday with post weight 42.5 -BP continues to be  dramatically lower here suggests she is not taking BP meds at home; Post HD wt 42.2 with net UF 1.2 L- will have slightly lower edw at discharge- 42.5 5. Anemia-hgb 13.6 1/26down to 8.5 Wed and up to 9.9 2/1 (last outpt hgb was 10.8)-on Aranesp 25 q 2 weeks last dose 1/23 - plan Aranesp 40 1/31 Fe 39 with 17% sat - need to increase IV Fe for discharge 6. Metabolic bone disease- P 6.3  fosrenol ^ back to 2 ac outptdose 1/29- eating betterUsually on 3.5 Ca bath - repeat labs in am - corrected Ca high enough to use 2.5 Ca bath instead- liekly will need to change bath at discharte 7. Nutrition- renal diet/vits alb 2.5 add supplement 8. Disp - hopefully home today or tomorrow if issues with pain control. Next HD Sat.   Myriam Jacobson, PA-C [place] 11/22/2016,9:27 AM  LOS: 6 days   Subjective:  C/o pain. When queried by Dr. Justin Mend, said she takes BP meds at home. C/o pain in pain at new cath site and right upper  AVF  Objective Vitals:   11/21/16 1130 11/21/16 1214 11/22/16 0508 11/22/16 0818  BP: 112/68 109/71 123/67 (!) 143/88  Pulse: 100 92 81 88  Resp:  18 18 17   Temp:  99.3 F (37.4 C) 98.3 F (36.8 C) 98.5 F (36.9 C)  TempSrc:  Oral Oral Oral  SpO2:  98% 100% 100%  Weight:      Height:       Physical Exam General: tearful Heart: RRR Lungs: no rales Abdomen: soft Extremities: no edema Dialysis Access: new left IJ new right BC AVF + bruit - both hands cool though no steal sx   Additional Objective Labs: Basic Metabolic Panel:  Recent Labs Lab 11/19/16 0505 11/20/16 0650 11/21/16 0745 11/22/16 0904  NA 135 138 135 132*  K 4.2 4.2 4.3 4.9  CL 98* 101 98*  --   CO2 23 27 26   --   GLUCOSE 110* 91 94 94  BUN 57* 16 45*  --   CREATININE 9.39* 5.12* 8.25*  --   CALCIUM 8.8* 9.0 9.5  --   PHOS 6.4* 5.6* 6.3*  --    Liver Function Tests:  Recent Labs Lab 11/16/16 0736 11/19/16 0505 11/20/16 0650 11/21/16 0745  AST 15  --   --   --   ALT 11*  --   --   --   ALKPHOS 64  --   --   --  BILITOT 0.4  --   --   --   PROT 5.8*  --   --   --   ALBUMIN 3.3* 2.3* 2.5* 2.4*    Recent Labs Lab 11/16/16 0736 11/18/16 1136  LIPASE 15  --   AMYLASE  --  145*   CBC:  Recent Labs Lab 11/16/16 0736  11/19/16 0505 11/21/16 0745 11/22/16 0904  WBC 21.4*  --  6.4 6.9  --   NEUTROABS 17.1*  --   --   --   --   HGB 13.6  < > 8.9* 8.5* 9.9*  HCT 40.1  < > 26.9* 26.7* 29.0*  MCV 88.1  --  89.4 91.8  --   PLT 257  --  151 184  --   < > = values in this interval not displayed. Blood Culture    Component Value Date/Time   SDES THROAT 03/26/2016 1200   SPECREQUEST NONE 03/26/2016 1200   CULT NO GROUP A STREP (S.PYOGENES) ISOLATED 03/26/2016 1200   REPTSTATUS 03/28/2016 FINAL 03/26/2016 1200    Cardiac Enzymes: No results for input(s): CKTOTAL, CKMB, CKMBINDEX, TROPONINI in the last 168 hours. CBG:  Recent Labs Lab 11/16/16 1655 11/16/16 1805 11/21/16 2151   GLUCAP 90 181* 106*   Iron Studies:  Recent Labs  11/21/16 0825  IRON 39  TIBC 230*   Lab Results  Component Value Date   INR 0.83 11/20/2016   INR 1.23 08/03/2015   INR 1.26 08/02/2015   Studies/Results: No results found. Medications: . sodium chloride 10 mL/hr at 11/22/16 0854   . [MAR Hold] amLODipine  10 mg Oral QHS  . [MAR Hold] doxercalciferol  4 mcg Intravenous Q M,W,F-HD  . [MAR Hold] feeding supplement  1 Container Oral TID BM  . [MAR Hold] heparin  5,000 Units Subcutaneous Q8H  . [MAR Hold] labetalol  300 mg Oral BID  . [MAR Hold] lanthanum  2,000 mg Oral TID WC  . [MAR Hold] multivitamin  1 tablet Oral QHS  . [MAR Hold] pantoprazole  40 mg Oral BID  . [MAR Hold] sodium chloride flush  3 mL Intravenous Q12H

## 2016-11-22 NOTE — Op Note (Signed)
    OPERATIVE NOTE   PROCEDURE: 1. Left IJ 19cm tunneled dialysis catheter with US guidance 2. Right brachiocephalic arteriovenous fistula placement  PRE-OPERATIVE DIAGNOSIS: esrd  POST-OPERATIVE DIAGNOSIS: same  SURGEON: Glenford Garis C. Donzetta Matters, MD  ASSISTANT: Leontine Locket, PA  ANESTHESIA: general  ESTIMATED BLOOD LOSS: 20 cc  FINDING(S): occluded right innominate vein, suitable right cephalic vein for fistula creation, multiphasic ulnar signal at completion with palpable thrill in runoff vein   INDICATIONS:   Karla Garner is a 31 y.o. female who presented with occluded right radiocephalic AV fistula. She underwent attempted thrombectomy ultimately had right femoral Artery catheter placed for dialysis access. She is now indicated fistula with tunneled dialysis catheter placement.  DESCRIPTION: After full informed written consent was obtained from the patient, she was taken the operating room where she was placed supine on the operating table and general anesthesia was induced. Her bilateral neck and chest was then sterilely prepped and draped in the usual fashion. Under ultrasound guidance cannulated the vein with 18-gauge needle but no wire wouldn't pass. I then use a micropuncture needle and wire with micropuncture sheath and Glidewire that also would not pass. These were removed and pressure held. Term attention the patient's left identified a left internal jugular vein that did have evidence of chronic thrombus but was patent and compressible. This was cannulated with a microneedle and wire did pass easily into the right atrium. Exchanged for micropuncture sheath and then place a J-wire into the inferior vena cava. The tract and the neck was serially dilated and tunneled a 19 cm catheter. Counter incision. Under fluoroscopic guidance and placed introducer and then placed the catheter into the level of the right atrium without complication. The catheter was then assembled and both  ports flushed easily and were locked with heparin. Catheter was affixed with 3-0 nylon suture and the neck incision closed with 4-0 Monocryl. This time sterile dressing was placed drapes were taken down. We removed her right femoral temporary catheter pressure was held and we prepped and draped her right upper extremity for a fistula. Under ultrasound guidance and then identified her cephalic vein and brachial artery and these were marked on the skin. Transverse incision was made first dissected out the vein. It was clamped centrally and divided between ties distally. One large side branch was also tied off. The brachial artery was then dissected by dividing the fascia and vessel loops placed around this. I then prepared the vein by dilating it up to 3 mm and spatulating the end. It was flushed with heparinized saline and again clamped. Then clamped the artery proximally and distally opened longitudinally and flushed with heparinized saline. The vein was then sewn end-to-side with 6-0 Prolene suture. Prior to releasing clamps allow flushing maneuvers. Upon completing our anastomosis and releasing Normajean Baxter do a palpable thrill. There was a multiphasic signal T1 artery at the level of the wrist. Satisfied we obtained hemostasis of the wound closed in layers with 3-0 Vicryl for Monocryl and Dermabond above that. Patient was awakened from anesthesia having tolerated the procedure well.  COMPLICATIONS: none immediate  CONDITION: stable   Karla Garner C. Donzetta Matters, MD [facility] Office: [phone] Pager: [phone]  11/22/2016, 1:07 PM

## 2016-11-22 NOTE — Anesthesia Procedure Notes (Signed)
Procedure Name: LMA Insertion Date/Time: 11/22/2016 11:28 AM Performed by: Carney Living Pre-anesthesia Checklist: Patient identified, Emergency Drugs available, Suction available, Patient being monitored and Timeout performed Patient Re-evaluated:Patient Re-evaluated prior to inductionPreoxygenation: Pre-oxygenation with 100% oxygen Intubation Type: IV induction Ventilation: Mask ventilation without difficulty LMA: LMA inserted Number of attempts: 1 Placement Confirmation: positive ETCO2 and breath sounds checked- equal and bilateral Tube secured with: Tape Dental Injury: Teeth and Oropharynx as per pre-operative assessment

## 2016-11-22 NOTE — Progress Notes (Signed)
   Subjective:  Patient seen after placement of her RUE AVF and Left IJ tunneled dialysis catheter placement. Interpreter line was used for communication. She has post op pain at her neck and right arm. She does report continued stomach pains when eating that she says began yesterday. She has a sensation of burning in her stomach. Her pain occurs in between mealtime as well. Sometimes she feels full after a small amount of food. She has not had this kind of stomach pain before. She feels constipated with some pain when trying pass stool. She is reporting some chills. She denies any fevers, blood in the stool, diarrhea, prior history of gallstones, or significant alcohol use.  Objective:  Vital signs in last 24 hours: Vitals:   11/22/16 1400 11/22/16 1415 11/22/16 1430 11/22/16 1513  BP: (!) 126/92 139/85 124/87 (!) 144/85  Pulse:   69 87  Resp:    18  Temp:    97.6 F (36.4 C)  TempSrc:    Oral  SpO2:   100% 100%  Weight:      Height:       General: resting in bed, uncomfortable with movement Cardiac: Left IJ TDC placed with dressing covering area, RRR, no rubs, murmurs or gallops Abd: soft, tender to palpation in the RLQ, minimal tenderness in RUQ and Epigastric Region Ext: Right arm with AVF placed today Neuro: alert and oriented X3   Assessment/Plan:  Principal Problem:   Acute pancreatitis Active Problems:   Glomerulonephritis   ESRD (end stage renal disease) (HCC)   Clotted renal dialysis AV graft (HCC)   Thrombosis of arteriovenous fistula (HCC)   Epigastric abdominal pain  Abdominal Pain Epigastric pain and CT findings of pancreatoduodenaledema may indicate possible pancreatitis although her lipase is normal. Her pain today is more localized to the RLQ. Beta HCG was negative on admission. She does report a burning sensation in her stomach as well. Her symptoms are suspicious for PUD which may explain some of her CT findings if a duodenal ulcer is present. Will plan for  HIDA scan tomorrow morning. May need EGD if HIDA negative for visualization of stomach and proximal duodenum. -Continue pantoprazole 40 mg BID PO -Dr. 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  Left IJ TDC placed and right arm AVF placed 11/22/2016 -Renal function panel in the morning  Thrombosed AV Fistula Thrombosed R radiocephalic fistula, femoral catheter placed on 1/26 for dialysis access.   -TDC and right AVF by VVS placed 2/1.  HTN BP slightly elevated post-op (meds held today). -Continue home amlodipine 10 mg daily, labetalol 300 mg BID  Dispo: Anticipated discharge in approximately 2-3 day(s).   Zada Finders, MD 11/22/2016, 6:13 PM

## 2016-11-23 ENCOUNTER — Encounter (HOSPITAL_COMMUNITY): Payer: Self-pay | Admitting: Physician Assistant

## 2016-11-23 ENCOUNTER — Inpatient Hospital Stay (HOSPITAL_COMMUNITY): Payer: Medicaid Other

## 2016-11-23 ENCOUNTER — Encounter (HOSPITAL_COMMUNITY)
Admission: EM | Disposition: A | Discharge status: Home / Self Care | Payer: Self-pay | Source: Home / Self Care | Attending: Internal Medicine

## 2016-11-23 DIAGNOSIS — R112 Nausea with vomiting, unspecified: Secondary | ICD-10-CM

## 2016-11-23 DIAGNOSIS — K3189 Other diseases of stomach and duodenum: Secondary | ICD-10-CM

## 2016-11-23 DIAGNOSIS — K2970 Gastritis, unspecified, without bleeding: Secondary | ICD-10-CM

## 2016-11-23 DIAGNOSIS — K2980 Duodenitis without bleeding: Secondary | ICD-10-CM

## 2016-11-23 DIAGNOSIS — R933 Abnormal findings on diagnostic imaging of other parts of digestive tract: Secondary | ICD-10-CM

## 2016-11-23 DIAGNOSIS — K8689 Other specified diseases of pancreas: Secondary | ICD-10-CM

## 2016-11-23 DIAGNOSIS — R1013 Epigastric pain: Secondary | ICD-10-CM

## 2016-11-23 DIAGNOSIS — E875 Hyperkalemia: Secondary | ICD-10-CM

## 2016-11-23 LAB — RENAL FUNCTION PANEL
ALBUMIN: 2.7 g/dL — AB (ref 3.5–5.0)
ANION GAP: 8 (ref 5–15)
BUN: 38 mg/dL — ABNORMAL HIGH (ref 6–20)
CALCIUM: 9.4 mg/dL (ref 8.9–10.3)
CO2: 22 mmol/L (ref 22–32)
Chloride: 102 mmol/L (ref 101–111)
Creatinine, Ser: 7.94 mg/dL — ABNORMAL HIGH (ref 0.44–1.00)
GFR calc non Af Amer: 6 mL/min — ABNORMAL LOW (ref >60)
GFR, EST AFRICAN AMERICAN: 7 mL/min — AB (ref >60)
Glucose, Bld: 94 mg/dL (ref 65–99)
PHOSPHORUS: 6.2 mg/dL — AB (ref 2.5–4.6)
Potassium: 6.4 mmol/L (ref 3.5–5.1)
SODIUM: 132 mmol/L — AB (ref 135–145)

## 2016-11-23 LAB — CBC
HCT: 28.7 % — ABNORMAL LOW (ref 36.0–46.0)
HEMOGLOBIN: 9.3 g/dL — AB (ref 12.0–15.0)
MCH: 29.5 pg (ref 26.0–34.0)
MCHC: 32.4 g/dL (ref 30.0–36.0)
MCV: 91.1 fL (ref 78.0–100.0)
Platelets: 237 10*3/uL (ref 150–400)
RBC: 3.15 MIL/uL — AB (ref 3.87–5.11)
RDW: 14 % (ref 11.5–15.5)
WBC: 9.7 10*3/uL (ref 4.0–10.5)

## 2016-11-23 SURGERY — CANCELLED PROCEDURE

## 2016-11-23 MED ORDER — HEPARIN SODIUM (PORCINE) 1000 UNIT/ML DIALYSIS: 1000.0000 [IU] | INTRAMUSCULAR | Status: DC | PRN

## 2016-11-23 MED ORDER — DARBEPOETIN ALFA 40 MCG/0.4ML IJ SOSY: 40.0000 ug | PREFILLED_SYRINGE | INTRAMUSCULAR | Status: DC

## 2016-11-23 MED ORDER — DOXERCALCIFEROL 4 MCG/2ML IV SOLN
INTRAVENOUS | Status: AC
  Administered 2016-11-24: 4 ug via INTRAVENOUS
  Filled 2016-11-23: qty 2

## 2016-11-23 MED ORDER — PENTAFLUOROPROP-TETRAFLUOROETH EX AERO: 1.0000 "application " | INHALATION_SPRAY | CUTANEOUS | Status: DC | PRN

## 2016-11-23 MED ORDER — SODIUM CHLORIDE 0.9 % IV SOLN: 100.0000 mL | INTRAVENOUS | Status: DC | PRN

## 2016-11-23 MED ORDER — TECHNETIUM TC 99M MEBROFENIN IV KIT
5.2000 | PACK | Freq: Once | INTRAVENOUS | Status: AC | PRN
  Administered 2016-11-23: 5.2 via INTRAVENOUS

## 2016-11-23 MED ORDER — HEPARIN SODIUM (PORCINE) 1000 UNIT/ML DIALYSIS: 20.0000 [IU]/kg | INTRAMUSCULAR | Status: DC | PRN

## 2016-11-23 MED ORDER — LIDOCAINE HCL (PF) 1 % IJ SOLN: 5.0000 mL | INTRAMUSCULAR | Status: DC | PRN

## 2016-11-23 MED ORDER — ONDANSETRON HCL 4 MG/2ML IJ SOLN
4.0000 mg | Freq: Once | INTRAMUSCULAR | Status: AC
  Administered 2016-11-23: 4 mg via INTRAVENOUS
  Filled 2016-11-23: qty 2

## 2016-11-23 MED ORDER — SODIUM CHLORIDE 0.9 % IV SOLN: INTRAVENOUS | Status: DC

## 2016-11-23 MED ORDER — ALTEPLASE 2 MG IJ SOLR: 2.0000 mg | Freq: Once | INTRAMUSCULAR | Status: DC | PRN

## 2016-11-23 MED ORDER — LIDOCAINE-PRILOCAINE 2.5-2.5 % EX CREA: 1.0000 "application " | TOPICAL_CREAM | CUTANEOUS | Status: DC | PRN

## 2016-11-23 NOTE — Progress Notes (Signed)
[facility] Progress Note   Subjective:  Seen in room. C/o pain in entire R arm and L upper chest (s/p cath/AVF placement). No dyspnea.  Objective Vitals:   11/22/16 1513 11/22/16 2100 11/23/16 0500 11/23/16 1022  BP: (!) 144/85 (!) 161/97 111/69 110/70  Pulse: 87 83 85 92  Resp: 18 20 20 18   Temp: 97.6 F (36.4 C) 98.1 F (36.7 C) 98.2 F (36.8 C) 98.8 F (37.1 C)  TempSrc: Oral Oral Oral Oral  SpO2: 100% 100% 100% 98%  Weight:  41 kg (90 lb 6.2 oz)    Height:       Physical Exam General: Well appearing female, NAD. In obvious pain with arm movements or palpation of L chest Heart: RRR; no murmur Lungs: CTAB Abdomen: soft, non-tender Extremities: No LE edema Dialysis Access: TDC in L chest which is tender. New RUE AVF + tender, + thrill.  Additional Objective Labs: Basic Metabolic Panel:  Recent Labs Lab 11/19/16 0505 11/20/16 0650 11/21/16 0745 11/22/16 0904  NA 135 138 135 132*  K 4.2 4.2 4.3 4.9  CL 98* 101 98*  --   CO2 23 27 26   --   GLUCOSE 110* 91 94 94  BUN 57* 16 45*  --   CREATININE 9.39* 5.12* 8.25*  --   CALCIUM 8.8* 9.0 9.5  --   PHOS 6.4* 5.6* 6.3*  --    Liver Function Tests:  Recent Labs Lab 11/19/16 0505 11/20/16 0650 11/21/16 0745  ALBUMIN 2.3* 2.5* 2.4*    Recent Labs Lab 11/18/16 1136  AMYLASE 145*   CBC:  Recent Labs Lab 11/19/16 0505 11/21/16 0745 11/22/16 0904 11/23/16 1010  WBC 6.4 6.9  --  9.7  HGB 8.9* 8.5* 9.9* 9.3*  HCT 26.9* 26.7* 29.0* 28.7*  MCV 89.4 91.8  --  91.1  PLT 151 184  --  237   CBG:  Recent Labs Lab 11/16/16 1655 11/16/16 1805 11/21/16 2151  GLUCAP 90 181* 106*   Iron Studies:  Recent Labs  11/21/16 0825  IRON 39  TIBC 230*   Studies/Results: Nm Hepatobiliary Liver Func  Result Date: 11/23/2016 CLINICAL DATA:  Abdominal pain . EXAM: NUCLEAR MEDICINE HEPATOBILIARY IMAGING TECHNIQUE: Sequential images of the abdomen were obtained out to 60 minutes following  intravenous administration of radiopharmaceutical. RADIOPHARMACEUTICALS:  5.2 mCi Tc-62m  Choletec IV COMPARISON:  Ultrasound 11/16/2016. FINDINGS: Prompt uptake and biliary excretion of activity by the liver is seen. Gallbladder activity is visualized, consistent with patency of cystic duct. Biliary activity passes into small bowel, consistent with patent common bile duct. IMPRESSION: Normal exam. Electronically Signed   By: Marcello Moores  Register   On: 11/23/2016 08:50   Dg Chest Port 1 View  Result Date: 11/22/2016 CLINICAL DATA:  Status post dialysis catheter insertion. EXAM: PORTABLE CHEST 1 VIEW COMPARISON:  03/26/2016 FINDINGS: A left-sided subclavian approach dialysis catheter terminates in the mid right atrium. Numerous leads and wires project over the chest. Midline trachea. Normal heart size. No pleural effusion or pneumothorax. No congestive failure. IMPRESSION: Dialysis catheter terminating in the right atrium.  No pneumothorax. Electronically Signed   By: Abigail Miyamoto M.D.   On: 11/22/2016 15:48   Dg Fluoro Guide Cv Line-no Report  Result Date: 11/22/2016 Fluoroscopy was utilized by the requesting physician.  No radiographic interpretation.   Medications:  . amLODipine  10 mg Oral QHS  . doxercalciferol  4 mcg Intravenous Q M,W,F-HD  . feeding supplement  1 Container Oral TID BM  .  heparin  5,000 Units Subcutaneous Q8H  . labetalol  300 mg Oral BID  . lanthanum  2,000 mg Oral TID WC  . multivitamin  1 tablet Oral QHS  . pantoprazole  40 mg Oral BID  . sodium chloride flush  3 mL Intravenous Q12H    Dialysis Orders: TTS at [facility] 3:45 hours, BFR350/DFR A1.5, EDW 43.2kg, 2K/3.5Ca bath - Heparin 1700units q HD - Aranesp 70mcg q wk (last 1/23) - venofer 50mg  q week - Hectoral 26mcg IV q HD  Assessment/Plan: 1. Pancreatitis v. PUD: LFTs ok, CT findings c/w pancreatitis, but normal lipase level. Work-up per primary. S/p normal HIDA scan, for EGD on 2/2. 2. ESRD: HD off schedule this  week, changing back to TTS schedule, next 2/3.  3. HTN/volume: BP ok, below outpt EDW. Low UF as tolerated. 4. Anemia: Hgb 9.3. Start Aranesp 83mcg weekly. 5. Secondary hyperparathyroidism: Ca ok, Phos high. Continue binders/hectoral for now. 6. Nutrition:  Alb 2.4, continue supplements. 7. Hx clotted AVF (recurrent): Now s/p L IJ TDC and RUE AVF 2/1 by Dr. Bridgett Larsson. Pt has significant pain associated with both areas, but both appear functional. Will monitor.  Veneta Penton, PA-C 11/23/2016, 10:51 AM  [facility] Pager: [phone]  Seen on dialysis  2/2.5 bath qb qd 350/600 UF 1L RIJ TC + rt BCF +thrill. Resting comfortably w/ BP 168/73 No changes to regimen.  Otelia Santee, MD [facility]

## 2016-11-23 NOTE — Progress Notes (Signed)
  [facility] Progress Note  Subjective  - POD #1  Pain with entire left arm and hand  Objective Vitals:   11/22/16 2100 11/23/16 0500  BP: (!) 161/97 111/69  Pulse: 83 85  Resp: 20 20  Temp: 98.1 F (36.7 C) 98.2 F (36.8 C)    Intake/Output Summary (Last 24 hours) at 11/23/16 1009 Last data filed at 11/22/16 2200  Gross per 24 hour  Intake             1063 ml  Output                0 ml  Net             1063 ml   Right upper arm fistula with palpable thrill. Incision clean and intact. Right wrist incision clean and intact. Difficult to palpate radial pulse due to pain and incision. There was good doppler flow at radial artery. Right hand warm  Assessment/Planning: 31 y.o. female is s/p: right brachiocephalic AV fistula placement and insertion of left IJ TDC 1 Day Post-Op   Fistula is patent.  Having pain in left arm and hand, likely from incisions. Hand appears well perfused however with good radial doppler flow. Do not suspect steal.  F/u in 4-6 weeks with duplex to check fistula maturation.   Alvia Grove 11/23/2016 10:09 AM --  Laboratory CBC    Component Value Date/Time   WBC 6.9 11/21/2016 0745   HGB 9.9 (L) 11/22/2016 0904   HCT 29.0 (L) 11/22/2016 0904   PLT 184 11/21/2016 0745    BMET    Component Value Date/Time   NA 132 (L) 11/22/2016 0904   K 4.9 11/22/2016 0904   CL 98 (L) 11/21/2016 0745   CO2 26 11/21/2016 0745   GLUCOSE 94 11/22/2016 0904   BUN 45 (H) 11/21/2016 0745   CREATININE 8.25 (H) 11/21/2016 0745   CREATININE 3.74 (H) 02/16/2013 1221   CALCIUM 9.5 11/21/2016 0745   CALCIUM 8.2 (L) 04/11/2015 1508   GFRNONAA 6 (L) 11/21/2016 0745   GFRAA 7 (L) 11/21/2016 0745    COAG Lab Results  Component Value Date   INR 0.83 11/20/2016   INR 1.23 08/03/2015   INR 1.26 08/02/2015   No results found for: PTT  Antibiotics Anti-infectives    Start     Dose/Rate Route Frequency Ordered Stop   11/22/16 1200   ceFAZolin (ANCEF) IVPB 2g/100 mL premix     2 g 200 mL/hr over 30 Minutes Intravenous  Once 11/22/16 1149 11/22/16 1150   11/16/16 1442  dextrose 5 % with cefUROXime (ZINACEF) ADS Med    Comments:  Laurita Quint   : cabinet override      11/16/16 1442 11/17/16 [place], PA-C [facility] Office: [phone] Pager: [phone] 11/23/2016 10:09 AM

## 2016-11-23 NOTE — Progress Notes (Signed)
Labs just resulted. Alert K = 6.4, will change plans and plan for dialysis today (2/2).  Loren Racer, PA-C  11/23/2016  [facility] Pager: [phone]

## 2016-11-23 NOTE — Progress Notes (Signed)
Spoke with MD on call and he gave orders that pt can have sips with meds and ice chips.   Eleanora Neighbor, RN

## 2016-11-23 NOTE — Anesthesia Postprocedure Evaluation (Signed)
Anesthesia Post Note  Patient: Karla Garner  Procedure(s) Performed: Procedure(s) (LRB): ARTERIOVENOUS (AV) FISTULA CREATION (Right) INSERTION OF DIALYSIS CATHETER - LEFT INTERNAL JUGULAR PLACEMENT (Left)  Patient location during evaluation: PACU Anesthesia Type: General Level of consciousness: awake and alert Pain management: pain level controlled Vital Signs Assessment: post-procedure vital signs reviewed and stable Respiratory status: spontaneous breathing, nonlabored ventilation, respiratory function stable and patient connected to nasal cannula oxygen Cardiovascular status: blood pressure returned to baseline and stable Postop Assessment: no signs of nausea or vomiting Anesthetic complications: no       Last Vitals:  Vitals:   11/22/16 2100 11/23/16 0500  BP: (!) 161/97 111/69  Pulse: 83 85  Resp: 20 20  Temp: 36.7 C 36.8 C    Last Pain:  Vitals:   11/23/16 0500  TempSrc: Oral  PainSc:                  Tiajuana Amass

## 2016-11-23 NOTE — Progress Notes (Signed)
CRITICAL VALUE ALERT  Critical value received:  Potassium 6.4  Date of notification:  2.2.18  Time of notification:  1107  Critical value read back:Yes.    Nurse who received alert:  Elna Breslow, RN  MD notified (1st page): Dr. Lovena Le  Time of first page: 1110  MD notified (2nd page): Dr. Posey Pronto  Time of second page:1115  Responding MD: Dr. Lovena Le  Time MD responded:  [phone]

## 2016-11-23 NOTE — Consult Note (Signed)
[facility] Consult: 10:49 AM 11/23/2016  LOS: 7 days    Referring Provider: Dr Eppie Gibson  Primary Care Physician:  Follows with [facility]. Primary Gastroenterologist:  Althia Forts     Reason for Consultation:  Epigastric pain.   HPI: Karla Garner is a 31 y.o. female.  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 TTS HD.  HTN.  Thrombocytopenia (to 54K in 07/2015).  Normocytic anemia (07/2015, Hgb nadir 5.9)Depression.   S/P C section.  S/p vascular/HD access procedures.  Renal osteodystrophy   Patient admitted 11/16/16 with 24 hours of nausea, bilious emesis, epigastric pain. Pain worse after eating.   Lipase, amylase and LFTs not elevated. 11/16/2016 noncontrasted CT abdomen pelvis revealed edema/inflammatory change in the head/neck of the pancreas and first and second portions of duodenum. Rule out pancreatitis or duodenitis. 11/16/2016 abdominal ultrasound with tiny amount GB sludge.  0.6 mm CBD.   11/23/16 HIDA: Normal  Patient determined to have occluded AV fistula and underwent placement of left IJ catheter placement and right brachiocephalic AV fistula placement on 11/22/16.  Pt not on PPI at home, has been started as inpt.  Patient reports improvement in her nausea, vomiting, she last vomited after surgery last night. Pain is improved but not completely resolved. What she describes is that the pain feels as though the food is not dropping into her stomach and is hanging in the upper stomach or lower esophagus. She's only had this symptom for about a week.  No dark or bloody stools. No constipation or diarrhea. Periods are generally monthly and last about 3 days.  She doesn't use NSAIDs or aspirin products.  Past Medical History:  Diagnosis Date  . CKD (chronic kidney disease), stage V ([place])   .  Glomerulonephritis   . Hemodialysis patient ([place])    Tu, Th, Sat  . Hypertension   . PONV (postoperative nausea and vomiting)    was on dialysis i during pregnancy2014  . Thrombocytopenia ([place]) 2014    Past Surgical History:  Procedure Laterality Date  . AV FISTULA PLACEMENT Left   . AV FISTULA PLACEMENT Right 04/14/2015   Procedure: CREATION OF RIGHT RADIOCEPHALIC ARTERIOVENOUS (AV) FISTULA ;  Surgeon: Angelia Mould, MD;  Location: [facility];  Service: Vascular;  Laterality: Right;  . CESAREAN SECTION     2009  . EXCHANGE OF A DIALYSIS CATHETER Right 04/14/2015   Procedure: EXCHANGE OF RIGHT INTERNAL JUGULAR DIALYSIS CATHETER;  Surgeon: Angelia Mould, MD;  Location: [facility];  Service: Vascular;  Laterality: Right;  . LIGATION OF ARTERIOVENOUS  FISTULA Left 11/05/2014   Procedure: LIGATION OF LEFT ARM  BRACHIO-CEPHALIC ARTERIOVENOUS  FISTULA ,& REPAIR OF BRACHIAL ARTERY.;  Surgeon: Mal [name], MD;  Location: [facility];  Service: Vascular;  Laterality: Left;  . THROMBECTOMY W/ EMBOLECTOMY Right 11/16/2016   Procedure: THROMBECTOMY REVISION OF ARTERIOVENOUS FISTULA - RIGHT ARM;  Surgeon: [name], MD;  Location: [facility];  Service: Vascular;  Laterality: Right;  . TUBAL LIGATION  2014    Prior to Admission medications   Medication  Sig Start Date End Date Taking? Authorizing Provider  amLODipine (NORVASC) 10 MG tablet Take 10 mg by mouth daily.   Yes Historical Provider, MD  sevelamer carbonate (RENVELA) 800 MG tablet Take 1 tablet (800 mg total) by mouth 3 (three) times daily with meals. Patient not taking: Reported on 11/16/2016 08/07/15   Reyne [facility], MD    Scheduled Meds: . amLODipine  10 mg Oral QHS  . doxercalciferol  4 mcg Intravenous Q M,W,F-HD  . feeding supplement  1 Container Oral TID BM  . heparin  5,000 Units Subcutaneous Q8H  . labetalol  300 mg Oral BID  . lanthanum  2,000 mg Oral TID WC  . multivitamin  1 tablet Oral QHS  . pantoprazole  40 mg Oral BID  .  sodium chloride flush  3 mL Intravenous Q12H   Infusions:  PRN Meds: acetaminophen **OR** acetaminophen, oxyCODONE-acetaminophen   Allergies as of 11/16/2016  . (No Known Allergies)    History reviewed. No pertinent family history.  Social History   Social History  . Marital status: Single    Spouse name: N/A  . Number of children: N/A  . Years of education: N/A   Occupational History  . Not on file.   Social History Main Topics  . Smoking status: Never Smoker  . Smokeless tobacco: Never Used  . Alcohol use No  . Drug use: No  . Sexual activity: Not on file   Other Topics Concern  . Not on file   Social History Narrative   As of February 2018 she has 3 children with whom she lives they are aged 92, 61 and 2.     REVIEW OF SYSTEMS: Constitutional:  No weakness or fatigue. ENT:  No nose bleeds Pulm:  No cough or dyspnea CV:  No palpitations, no LE edema. No chest pain GU:  No hematuria, no frequency GI:  Per HPI Heme:  No unusual bleeding or excessive bruising.   Transfusions:  Received PRBC x 2 in 07/2015 Neuro:  No headaches, no peripheral tingling or numbness Derm:  No itching, no rash or sores.  Endocrine:  No sweats or chills.  No polyuria or dysuria Immunization:  Did not inquire as to recent vaccinations. Travel:  None beyond local counties in last few months.    PHYSICAL EXAM: Vital signs in last 24 hours: Vitals:   11/23/16 0500 11/23/16 1022  BP: 111/69 110/70  Pulse: 85 92  Resp: 20 18  Temp: 98.2 F (36.8 C) 98.8 F (37.1 C)   Wt Readings from Last 3 Encounters:  11/22/16 41 kg (90 lb 6.2 oz)  08/06/15 42.4 kg (93 lb 7.6 oz)  06/28/15 38.2 kg (84 lb 3.5 oz)    General: Pleasant, patient does not appear ill or uncomfortable. Head:  No facial asymmetry or swelling. Head trauma  Eyes:  No scleral icterus. No conjunctival pallor. EOMI. Ears:  Good hearing  Nose:  No nasal discharge or congestion Mouth:  Moist, clear oral mucosa. Good  dentition. Neck:  No JVD, no masses, no thyromegaly.  HD catheter at the left neck Lungs:  Clear to auscultation percussion bilaterally. No dyspnea or cough. Heart: RRR. No MRG. S1, S2 present.   Abdomen:  Soft. Not distended. Active bowel sounds. Tenderness in the epigastrium is mild to moderate and no guarding or rebound associated. No HSM.Marland Kitchen   Rectal: Deferred   Musc/Skeltl: No joint swelling or gross contracture deformities. Extremities:  AV graft on the right upper extremity.  Neurologic:  No tremor, no limb weakness. Patient alert and oriented times 3. Skin:  No jaundice, no pallor. No telangiectasia Tattoos:  None observed Nodes:  No cervical adenopathy   Psych:  Pleasant, cooperative, calm.  Intake/Output from previous day: 02/01 0701 - 02/02 0700 In: 1063 [P.O.:60; I.V.:1003] Out: 0  Intake/Output this shift: No intake/output data recorded.  LAB RESULTS:  Recent Labs  11/21/16 0745 11/22/16 0904 11/23/16 1010  WBC 6.9  --  9.7  HGB 8.5* 9.9* 9.3*  HCT 26.7* 29.0* 28.7*  PLT 184  --  237   BMET Lab Results  Component Value Date   NA 132 (L) 11/22/2016   NA 135 11/21/2016   NA 138 11/20/2016   K 4.9 11/22/2016   K 4.3 11/21/2016   K 4.2 11/20/2016   CL 98 (L) 11/21/2016   CL 101 11/20/2016   CL 98 (L) 11/19/2016   CO2 26 11/21/2016   CO2 27 11/20/2016   CO2 23 11/19/2016   GLUCOSE 94 11/22/2016   GLUCOSE 94 11/21/2016   GLUCOSE 91 11/20/2016   BUN 45 (H) 11/21/2016   BUN 16 11/20/2016   BUN 57 (H) 11/19/2016   CREATININE 8.25 (H) 11/21/2016   CREATININE 5.12 (H) 11/20/2016   CREATININE 9.39 (H) 11/19/2016   CALCIUM 9.5 11/21/2016   CALCIUM 9.0 11/20/2016   CALCIUM 8.8 (L) 11/19/2016   LFT  Recent Labs  11/21/16 0745  ALBUMIN 2.4*   PT/INR Lab Results  Component Value Date   INR 0.83 11/20/2016   INR 1.23 08/03/2015   INR 1.26 08/02/2015   Hepatitis Panel No results for input(s): HEPBSAG, HCVAB, HEPAIGM, HEPBIGM in the last 72  hours. C-Diff No components found for: CDIFF Lipase     Component Value Date/Time   LIPASE 15 11/16/2016 0736    Drugs of Abuse     Component Value Date/Time   LABOPIA NONE DETECTED 06/25/2015 1340   COCAINSCRNUR NONE DETECTED 06/25/2015 1340   LABBENZ NONE DETECTED 06/25/2015 1340   AMPHETMU NONE DETECTED 06/25/2015 1340   THCU NONE DETECTED 06/25/2015 1340   LABBARB NONE DETECTED 06/25/2015 1340     RADIOLOGY STUDIES: Nm Hepatobiliary Liver Func  Result Date: 11/23/2016 CLINICAL DATA:  Abdominal pain . EXAM: NUCLEAR MEDICINE HEPATOBILIARY IMAGING TECHNIQUE: Sequential images of the abdomen were obtained out to 60 minutes following intravenous administration of radiopharmaceutical. RADIOPHARMACEUTICALS:  5.2 mCi Tc-51m  Choletec IV COMPARISON:  Ultrasound 11/16/2016. FINDINGS: Prompt uptake and biliary excretion of activity by the liver is seen. Gallbladder activity is visualized, consistent with patency of cystic duct. Biliary activity passes into small bowel, consistent with patent common bile duct. IMPRESSION: Normal exam. Electronically Signed   By: Marcello Moores  Register   On: 11/23/2016 08:50   Dg Chest Port 1 View  Result Date: 11/22/2016 CLINICAL DATA:  Status post dialysis catheter insertion. EXAM: PORTABLE CHEST 1 VIEW COMPARISON:  03/26/2016 FINDINGS: A left-sided subclavian approach dialysis catheter terminates in the mid right atrium. Numerous leads and wires project over the chest. Midline trachea. Normal heart size. No pleural effusion or pneumothorax. No congestive failure. IMPRESSION: Dialysis catheter terminating in the right atrium.  No pneumothorax. Electronically Signed   By: Abigail Miyamoto M.D.   On: 11/22/2016 15:48   Dg Fluoro Guide Cv Line-no Report  Result Date: 11/22/2016 Fluoroscopy was utilized by the requesting physician.  No radiographic interpretation.    ENDOSCOPIC STUDIES: None   IMPRESSION:   *  Epigastric pain, nonbloody nausea and vomiting. Imaging  suggestive of  pancreatitis versus duodenitis. LFTs, lipase, HIDA scan normal.  *  ESRD.   HD on TTS  *  S/p 11/22/16 placement of a new right AV graft and dialysis catheter due to clotted AV graft.  *  Normocytic anemia, acute on chronic.  Suspect blood loss related to recent vascular surgery is playing a role.    PLAN:     *  EGD today.     Azucena Freed  11/23/2016, 10:49 AM Pager: [phone]     Attending physician's note   I have taken a history, examined the patient and reviewed the chart. I agree with the Advanced Practitioner's note, impression and recommendations. Epigastric pain, N/V. Abd/pelvic CT without IV contrast showing edema and inflammatory changes about D1/D2 and head/neck of pancreas-duodenitis, pancreatitis were questioned. Lipase normal and insignificant amylase elevation argue against pancreatitis. RUQ US shows minimal GB sludge. HIDA negative. LFTs normal. R/O duodenitis, duodenal ulcer. Anemia secondary to CKD. Continue PPI. EGD when K+ has decreased-anesthesia declined to proceed until K+ has decreased. Tentatively EGD is scheduled for 0830 tomorrow.    Lucio Edward, MD [facility] [phone] Mon-Fri 8a-5p [phone] after 5p, weekends, holidays

## 2016-11-23 NOTE — Progress Notes (Addendum)
   Subjective: Patient at hemodialysis. Complaining of midepigastric pain and right arm pain. Otherwise she is doing okay. She had no additional questions.  Objective:  Vital signs in last 24 hours: Vitals:   11/23/16 1330 11/23/16 1400 11/23/16 1430 11/23/16 1500  BP: (!) 161/73 (!) 178/73 138/70 140/80  Pulse: 91 92 93 91  Resp:      Temp:      TempSrc:      SpO2:      Weight:      Height:       Physical Exam  Constitutional: She appears well-developed and well-nourished.  HENT:  Head: Normocephalic and atraumatic.  Cardiovascular: Normal rate and regular rhythm.   Respiratory: Effort normal and breath sounds normal.  GI: There is tenderness.  Tender to palpation in the midepigastric region.  Neurological: She is alert.     Assessment/Plan:  Principal Problem:   Acute pancreatitis Active Problems:   Glomerulonephritis   ESRD (end stage renal disease) ([place])   Clotted renal dialysis AV graft ([facility])   Thrombosis of arteriovenous fistula ([facility])   Epigastric abdominal pain  # Abdominal Pain Epigastric pain and CT findings of pancreatoduodenaledema may indicate possible pancreatitis although her lipase is normal.  Beta HCG was negative on admission. She does report a burning sensation in her stomach as well. Her symptoms are suspicious for PUD which may explain some of her CT findings if a duodenal ulcer is present. HIDA scan negative. Patient seen by gastroenterology and will need EGD. EGD was scheduled for today but secondary to hyperkalemia and need for dialysis this will be attempted tomorrow. -Continue pantoprazole 40 mg BID PO - GI following, plan for EGD   ESRD 2/2 IgA Nephropathy Patient noted to be hyperkalemic this morning prompting dialysis. She had dialysis this afternoon. She will need EGD tomorrow. -Renal following -HD per nephrology. Left IJ TDC placed and right arm AVF placed 11/22/2016 -Renal function panel in the morning  Thrombosed AV  Fistula Thrombosed R radiocephalic fistula, femoral catheter placed on 1/26 for d  HTN BP slightly elevated post-op. Vitals currently recorded and patient is at dialysis. -Continue home amlodipine 10 mg daily, labetalol 300 mg BID  FEN/GI: NPO at Midnight DVT/PE prophylaxis: Heparin Code: Full code    11/23/2016, 3:41 PM Pager: [phone]

## 2016-11-24 ENCOUNTER — Inpatient Hospital Stay (HOSPITAL_COMMUNITY): Payer: Medicaid Other | Admitting: Certified Registered"

## 2016-11-24 ENCOUNTER — Encounter (HOSPITAL_COMMUNITY): Payer: Self-pay

## 2016-11-24 ENCOUNTER — Encounter (HOSPITAL_COMMUNITY)
Admission: EM | Disposition: A | Discharge status: Home / Self Care | Payer: Self-pay | Source: Home / Self Care | Attending: Internal Medicine

## 2016-11-24 DIAGNOSIS — K2990 Gastroduodenitis, unspecified, without bleeding: Secondary | ICD-10-CM

## 2016-11-24 DIAGNOSIS — R933 Abnormal findings on diagnostic imaging of other parts of digestive tract: Secondary | ICD-10-CM

## 2016-11-24 DIAGNOSIS — K2970 Gastritis, unspecified, without bleeding: Secondary | ICD-10-CM

## 2016-11-24 HISTORY — PX: ESOPHAGOGASTRODUODENOSCOPY: SHX5428

## 2016-11-24 LAB — CBC
HEMATOCRIT: 26.4 % — AB (ref 36.0–46.0)
HEMOGLOBIN: 8.4 g/dL — AB (ref 12.0–15.0)
MCH: 29 pg (ref 26.0–34.0)
MCHC: 31.8 g/dL (ref 30.0–36.0)
MCV: 91 fL (ref 78.0–100.0)
Platelets: 254 10*3/uL (ref 150–400)
RBC: 2.9 MIL/uL — ABNORMAL LOW (ref 3.87–5.11)
RDW: 13.9 % (ref 11.5–15.5)
WBC: 7.7 10*3/uL (ref 4.0–10.5)

## 2016-11-24 LAB — RENAL FUNCTION PANEL
ANION GAP: 8 (ref 5–15)
Albumin: 2.4 g/dL — ABNORMAL LOW (ref 3.5–5.0)
BUN: 21 mg/dL — ABNORMAL HIGH (ref 6–20)
CALCIUM: 9.9 mg/dL (ref 8.9–10.3)
CO2: 26 mmol/L (ref 22–32)
Chloride: 99 mmol/L — ABNORMAL LOW (ref 101–111)
Creatinine, Ser: 5.38 mg/dL — ABNORMAL HIGH (ref 0.44–1.00)
GFR calc Af Amer: 12 mL/min — ABNORMAL LOW (ref >60)
GFR calc non Af Amer: 10 mL/min — ABNORMAL LOW (ref >60)
GLUCOSE: 91 mg/dL (ref 65–99)
POTASSIUM: 4.5 mmol/L (ref 3.5–5.1)
Phosphorus: 5.4 mg/dL — ABNORMAL HIGH (ref 2.5–4.6)
Sodium: 133 mmol/L — ABNORMAL LOW (ref 135–145)

## 2016-11-24 LAB — POCT I-STAT 4, (NA,K, GLUC, HGB,HCT)
Glucose, Bld: 93 mg/dL (ref 65–99)
HCT: 25 % — ABNORMAL LOW (ref 36.0–46.0)
Hemoglobin: 8.5 g/dL — ABNORMAL LOW (ref 12.0–15.0)
POTASSIUM: 4.6 mmol/L (ref 3.5–5.1)
SODIUM: 131 mmol/L — AB (ref 135–145)

## 2016-11-24 SURGERY — EGD (ESOPHAGOGASTRODUODENOSCOPY)
Anesthesia: Monitor Anesthesia Care

## 2016-11-24 MED ORDER — DOXERCALCIFEROL 4 MCG/2ML IV SOLN: 4.0000 ug | INTRAVENOUS | Status: DC

## 2016-11-24 MED ORDER — LANTHANUM CARBONATE 1000 MG PO CHEW: 2000.0000 mg | CHEWABLE_TABLET | Freq: Three times a day (TID) | ORAL | 0 refills | Status: DC

## 2016-11-24 MED ORDER — AMLODIPINE BESYLATE 10 MG PO TABS: 10.0000 mg | ORAL_TABLET | Freq: Every day | ORAL | 3 refills | Status: DC

## 2016-11-24 MED ORDER — LABETALOL HCL 300 MG PO TABS: 300.0000 mg | ORAL_TABLET | Freq: Two times a day (BID) | ORAL | 0 refills | Status: DC

## 2016-11-24 MED ORDER — DOXERCALCIFEROL 4 MCG/2ML IV SOLN
INTRAVENOUS | Status: AC
  Filled 2016-11-24: qty 2

## 2016-11-24 MED ORDER — DARBEPOETIN ALFA 40 MCG/0.4ML IJ SOSY: 40.0000 ug | PREFILLED_SYRINGE | INTRAMUSCULAR | Status: DC

## 2016-11-24 MED ORDER — SODIUM CHLORIDE 0.9 % IV SOLN
INTRAVENOUS | Status: DC | PRN
  Administered 2016-11-24: 09:00:00 via INTRAVENOUS

## 2016-11-24 MED ORDER — RENA-VITE PO TABS: 1.0000 | ORAL_TABLET | Freq: Every day | ORAL | 0 refills | Status: DC

## 2016-11-24 MED ORDER — PANTOPRAZOLE SODIUM 40 MG PO TBEC: 40.0000 mg | DELAYED_RELEASE_TABLET | Freq: Every day | ORAL | 0 refills | Status: DC

## 2016-11-24 MED ORDER — PANTOPRAZOLE SODIUM 40 MG PO TBEC: 40.0000 mg | DELAYED_RELEASE_TABLET | Freq: Two times a day (BID) | ORAL | 0 refills | Status: DC

## 2016-11-24 MED ORDER — LIDOCAINE 2% (20 MG/ML) 5 ML SYRINGE
INTRAMUSCULAR | Status: DC | PRN
  Administered 2016-11-24: 50 mg via INTRAVENOUS

## 2016-11-24 MED ORDER — PROPOFOL 10 MG/ML IV BOLUS
INTRAVENOUS | Status: DC | PRN
  Administered 2016-11-24: 20 mg via INTRAVENOUS
  Administered 2016-11-24: 10 mg via INTRAVENOUS
  Administered 2016-11-24 (×2): 20 mg via INTRAVENOUS
  Administered 2016-11-24: 10 mg via INTRAVENOUS

## 2016-11-24 NOTE — H&P (View-Only) (Signed)
[facility] Consult: 10:49 AM 11/23/2016  LOS: 7 days    Referring Provider: Dr Eppie Gibson  Primary Care Physician:  Follows with [facility]. Primary Gastroenterologist:  Althia Forts     Reason for Consultation:  Epigastric pain.   HPI: Karla Garner is a 31 y.o. female.  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 TTS HD.  HTN.  Thrombocytopenia (to 54K in 07/2015).  Normocytic anemia (07/2015, Hgb nadir 5.9)Depression.   S/P C section.  S/p vascular/HD access procedures.  Renal osteodystrophy   Patient admitted 11/16/16 with 24 hours of nausea, bilious emesis, epigastric pain. Pain worse after eating.   Lipase, amylase and LFTs not elevated. 11/16/2016 noncontrasted CT abdomen pelvis revealed edema/inflammatory change in the head/neck of the pancreas and first and second portions of duodenum. Rule out pancreatitis or duodenitis. 11/16/2016 abdominal ultrasound with tiny amount GB sludge.  0.6 mm CBD.   11/23/16 HIDA: Normal  Patient determined to have occluded AV fistula and underwent placement of left IJ catheter placement and right brachiocephalic AV fistula placement on 11/22/16.  Pt not on PPI at home, has been started as inpt.  Patient reports improvement in her nausea, vomiting, she last vomited after surgery last night. Pain is improved but not completely resolved. What she describes is that the pain feels as though the food is not dropping into her stomach and is hanging in the upper stomach or lower esophagus. She's only had this symptom for about a week.  No dark or bloody stools. No constipation or diarrhea. Periods are generally monthly and last about 3 days.  She doesn't use NSAIDs or aspirin products.  Past Medical History:  Diagnosis Date  . CKD (chronic kidney disease), stage V ([place])   .  Glomerulonephritis   . Hemodialysis patient ([place])    Tu, Th, Sat  . Hypertension   . PONV (postoperative nausea and vomiting)    was on dialysis i during pregnancy2014  . Thrombocytopenia ([place]) 2014    Past Surgical History:  Procedure Laterality Date  . AV FISTULA PLACEMENT Left   . AV FISTULA PLACEMENT Right 04/14/2015   Procedure: CREATION OF RIGHT RADIOCEPHALIC ARTERIOVENOUS (AV) FISTULA ;  Surgeon: Angelia Mould, MD;  Location: [place];  Service: Vascular;  Laterality: Right;  . CESAREAN SECTION     2009  . EXCHANGE OF A DIALYSIS CATHETER Right 04/14/2015   Procedure: EXCHANGE OF RIGHT INTERNAL JUGULAR DIALYSIS CATHETER;  Surgeon: Angelia Mould, MD;  Location: [place];  Service: Vascular;  Laterality: Right;  . LIGATION OF ARTERIOVENOUS  FISTULA Left 11/05/2014   Procedure: LIGATION OF LEFT ARM  BRACHIO-CEPHALIC ARTERIOVENOUS  FISTULA ,& REPAIR OF BRACHIAL ARTERY.;  Surgeon: Mal [name], MD;  Location: [place] [facility];  Service: Vascular;  Laterality: Left;  . THROMBECTOMY W/ EMBOLECTOMY Right 11/16/2016   Procedure: THROMBECTOMY REVISION OF ARTERIOVENOUS FISTULA - RIGHT ARM;  Surgeon: [name], MD;  Location: [place];  Service: Vascular;  Laterality: Right;  . TUBAL LIGATION  2014    Prior to Admission medications   Medication  Sig Start Date End Date Taking? Authorizing Provider  amLODipine (NORVASC) 10 MG tablet Take 10 mg by mouth daily.   Yes Historical Provider, MD  sevelamer carbonate (RENVELA) 800 MG tablet Take 1 tablet (800 mg total) by mouth 3 (three) times daily with meals. Patient not taking: Reported on 11/16/2016 08/07/15   Reyne Dumas, MD    Scheduled Meds: . amLODipine  10 mg Oral QHS  . doxercalciferol  4 mcg Intravenous Q M,W,F-HD  . feeding supplement  1 Container Oral TID BM  . heparin  5,000 Units Subcutaneous Q8H  . labetalol  300 mg Oral BID  . lanthanum  2,000 mg Oral TID WC  . multivitamin  1 tablet Oral QHS  . pantoprazole  40 mg Oral BID  .  sodium chloride flush  3 mL Intravenous Q12H   Infusions:  PRN Meds: acetaminophen **OR** acetaminophen, oxyCODONE-acetaminophen   Allergies as of 11/16/2016  . (No Known Allergies)    History reviewed. No pertinent family history.  Social History   Social History  . Marital status: Single    Spouse name: N/A  . Number of children: N/A  . Years of education: N/A   Occupational History  . Not on file.   Social History Main Topics  . Smoking status: Never Smoker  . Smokeless tobacco: Never Used  . Alcohol use No  . Drug use: No  . Sexual activity: Not on file   Other Topics Concern  . Not on file   Social History Narrative   As of February 2018 she has 3 children with whom she lives they are aged 84, 17 and 2.     REVIEW OF SYSTEMS: Constitutional:  No weakness or fatigue. ENT:  No nose bleeds Pulm:  No cough or dyspnea CV:  No palpitations, no LE edema. No chest pain GU:  No hematuria, no frequency GI:  Per HPI Heme:  No unusual bleeding or excessive bruising.   Transfusions:  Received PRBC x 2 in 07/2015 Neuro:  No headaches, no peripheral tingling or numbness Derm:  No itching, no rash or sores.  Endocrine:  No sweats or chills.  No polyuria or dysuria Immunization:  Did not inquire as to recent vaccinations. Travel:  None beyond local counties in last few months.    PHYSICAL EXAM: Vital signs in last 24 hours: Vitals:   11/23/16 0500 11/23/16 1022  BP: 111/69 110/70  Pulse: 85 92  Resp: 20 18  Temp: 98.2 F (36.8 C) 98.8 F (37.1 C)   Wt Readings from Last 3 Encounters:  11/22/16 41 kg (90 lb 6.2 oz)  08/06/15 42.4 kg (93 lb 7.6 oz)  06/28/15 38.2 kg (84 lb 3.5 oz)    General: Pleasant, patient does not appear ill or uncomfortable. Head:  No facial asymmetry or swelling. Head trauma  Eyes:  No scleral icterus. No conjunctival pallor. EOMI. Ears:  Good hearing  Nose:  No nasal discharge or congestion Mouth:  Moist, clear oral mucosa. Good  dentition. Neck:  No JVD, no masses, no thyromegaly.  HD catheter at the left neck Lungs:  Clear to auscultation percussion bilaterally. No dyspnea or cough. Heart: RRR. No MRG. S1, S2 present.   Abdomen:  Soft. Not distended. Active bowel sounds. Tenderness in the epigastrium is mild to moderate and no guarding or rebound associated. No HSM.Marland Kitchen   Rectal: Deferred   Musc/Skeltl: No joint swelling or gross contracture deformities. Extremities:  AV graft on the right upper extremity.  Neurologic:  No tremor, no limb weakness. Patient alert and oriented times 3. Skin:  No jaundice, no pallor. No telangiectasia Tattoos:  None observed Nodes:  No cervical adenopathy   Psych:  Pleasant, cooperative, calm.  Intake/Output from previous day: 02/01 0701 - 02/02 0700 In: 1063 [P.O.:60; I.V.:1003] Out: 0  Intake/Output this shift: No intake/output data recorded.  LAB RESULTS:  Recent Labs  11/21/16 0745 11/22/16 0904 11/23/16 1010  WBC 6.9  --  9.7  HGB 8.5* 9.9* 9.3*  HCT 26.7* 29.0* 28.7*  PLT 184  --  237   BMET Lab Results  Component Value Date   NA 132 (L) 11/22/2016   NA 135 11/21/2016   NA 138 11/20/2016   K 4.9 11/22/2016   K 4.3 11/21/2016   K 4.2 11/20/2016   CL 98 (L) 11/21/2016   CL 101 11/20/2016   CL 98 (L) 11/19/2016   CO2 26 11/21/2016   CO2 27 11/20/2016   CO2 23 11/19/2016   GLUCOSE 94 11/22/2016   GLUCOSE 94 11/21/2016   GLUCOSE 91 11/20/2016   BUN 45 (H) 11/21/2016   BUN 16 11/20/2016   BUN 57 (H) 11/19/2016   CREATININE 8.25 (H) 11/21/2016   CREATININE 5.12 (H) 11/20/2016   CREATININE 9.39 (H) 11/19/2016   CALCIUM 9.5 11/21/2016   CALCIUM 9.0 11/20/2016   CALCIUM 8.8 (L) 11/19/2016   LFT  Recent Labs  11/21/16 0745  ALBUMIN 2.4*   PT/INR Lab Results  Component Value Date   INR 0.83 11/20/2016   INR 1.23 08/03/2015   INR 1.26 08/02/2015   Hepatitis Panel No results for input(s): HEPBSAG, HCVAB, HEPAIGM, HEPBIGM in the last 72  hours. C-Diff No components found for: CDIFF Lipase     Component Value Date/Time   LIPASE 15 11/16/2016 0736    Drugs of Abuse     Component Value Date/Time   LABOPIA NONE DETECTED 06/25/2015 1340   COCAINSCRNUR NONE DETECTED 06/25/2015 1340   LABBENZ NONE DETECTED 06/25/2015 1340   AMPHETMU NONE DETECTED 06/25/2015 1340   THCU NONE DETECTED 06/25/2015 1340   LABBARB NONE DETECTED 06/25/2015 1340     RADIOLOGY STUDIES: Nm Hepatobiliary Liver Func  Result Date: 11/23/2016 CLINICAL DATA:  Abdominal pain . EXAM: NUCLEAR MEDICINE HEPATOBILIARY IMAGING TECHNIQUE: Sequential images of the abdomen were obtained out to 60 minutes following intravenous administration of radiopharmaceutical. RADIOPHARMACEUTICALS:  5.2 mCi Tc-61m  Choletec IV COMPARISON:  Ultrasound 11/16/2016. FINDINGS: Prompt uptake and biliary excretion of activity by the liver is seen. Gallbladder activity is visualized, consistent with patency of cystic duct. Biliary activity passes into small bowel, consistent with patent common bile duct. IMPRESSION: Normal exam. Electronically Signed   By: Marcello Moores  Register   On: 11/23/2016 08:50   Dg Chest Port 1 View  Result Date: 11/22/2016 CLINICAL DATA:  Status post dialysis catheter insertion. EXAM: PORTABLE CHEST 1 VIEW COMPARISON:  03/26/2016 FINDINGS: A left-sided subclavian approach dialysis catheter terminates in the mid right atrium. Numerous leads and wires project over the chest. Midline trachea. Normal heart size. No pleural effusion or pneumothorax. No congestive failure. IMPRESSION: Dialysis catheter terminating in the right atrium.  No pneumothorax. Electronically Signed   By: Abigail Miyamoto M.D.   On: 11/22/2016 15:48   Dg Fluoro Guide Cv Line-no Report  Result Date: 11/22/2016 Fluoroscopy was utilized by the requesting physician.  No radiographic interpretation.    ENDOSCOPIC STUDIES: None   IMPRESSION:   *  Epigastric pain, nonbloody nausea and vomiting. Imaging  suggestive of  pancreatitis versus duodenitis. LFTs, lipase, HIDA scan normal.  *  ESRD.   HD on TTS  *  S/p 11/22/16 placement of a new right AV graft and dialysis catheter due to clotted AV graft.  *  Normocytic anemia, acute on chronic.  Suspect blood loss related to recent vascular surgery is playing a role.    PLAN:     *  EGD today.     Azucena Freed  11/23/2016, 10:49 AM Pager: [phone]     Attending physician's note   I have taken a history, examined the patient and reviewed the chart. I agree with the Advanced Practitioner's note, impression and recommendations. Epigastric pain, N/V. Abd/pelvic CT without IV contrast showing edema and inflammatory changes about D1/D2 and head/neck of pancreas-duodenitis, pancreatitis were questioned. Lipase normal and insignificant amylase elevation argue against pancreatitis. RUQ US shows minimal GB sludge. HIDA negative. LFTs normal. R/O duodenitis, duodenal ulcer. Anemia secondary to CKD. Continue PPI. EGD when K+ has decreased-anesthesia declined to proceed until K+ has decreased. Tentatively EGD is scheduled for 0830 tomorrow.    Lucio Edward, MD [facility] [phone] Mon-Fri 8a-5p [phone] after 5p, weekends, holidays

## 2016-11-24 NOTE — Transfer of Care (Signed)
Immediate Anesthesia Transfer of Care Note  Patient: Karla Garner  Procedure(s) Performed: Procedure(s): ESOPHAGOGASTRODUODENOSCOPY (EGD) (N/A)  Patient Location: PACU  Anesthesia Type:General  Level of Consciousness: awake, alert , oriented and patient cooperative  Airway & Oxygen Therapy: Patient connected to nasal cannula oxygen  Post-op Assessment: Report given to RN and Post -op Vital signs reviewed and stable  Post vital signs: Reviewed  Last Vitals: 117/87, 88, 18, 100% Vitals:   11/24/16 0821 11/24/16 0920  BP: 130/82 117/82  Pulse: 91 (!) 105  Resp: 13 (!) 28  Temp: 36.9 C     Last Pain:  Vitals:   11/24/16 0821  TempSrc: Oral  PainSc:       Patients Stated Pain Goal: 0 (06/77/03 4035)  Complications: No apparent anesthesia complications

## 2016-11-24 NOTE — Procedures (Signed)
I was present at this session.  I have reviewed the session itself and made appropriate changes.  Bp low 100s.  Cath flow 350. tol vol of and solute.   Dru Laurel L 2/3/20184:02 PM

## 2016-11-24 NOTE — Progress Notes (Signed)
   Subjective:  No acute events overnight. Patient's right arm pain has improved. She continues to have abdominal pain but this is greatly improved. She denies nausea or vomiting. She states that she is ready to go home after her dialysis.  Objective: Physical Exam  Constitutional: She appears well-developed and well-nourished.  HENT:  Head: Normocephalic and atraumatic.  Cardiovascular: Normal rate and regular rhythm.   Respiratory: Effort normal and breath sounds normal.  GI: There is tenderness.  Tender to palpation in the midepigastric region. Tenderness seems improved from prior abdominal examinations.  Neurological: She is alert.     Assessment/Plan:  Principal Problem:   Acute pancreatitis Active Problems:   Glomerulonephritis   ESRD (end stage renal disease) ([place])   Clotted renal dialysis AV graft (HCC)   Thrombosis of arteriovenous fistula (HCC)   Epigastric abdominal pain   In summary, patient had right AV fistula placed and left IJ TDC by vascular surgery on 11/22/2016. She has residual right arm pain but sensation, strength and pulses are intact. She continues to have midepigastric abdominal pain with tenderness to palpation but this has improved. She went for EGD this morning which showed no evidence of gastric or duodenal ulceration. EGD did reveal diffuse mild gastritis in the antrum, pylorus and proximal segment of the duodenum. She can return to normal diet with twice a day proton pump inhibitor therapy 2 weeks followed by once daily therapy ongoing. She will have dialysis today and then will be appropriate for discharge.   # Abdominal Pain Epigastric pain and CT findings of pancreatoduodenaledema may indicate possible pancreatitis although her lipase is normal.  Abdominal pain improved subjectively and on examination. EGD today without evidence of ulcer disease. Does demonstrate mild gastritis in the antrum, pylorus and proximal segment of the duodenum. The exact  etiology of the patient's abdominal pain is unclear. This may represent lipase negative pancreatitis or gastritis without clear etiology. She will need 2 weeks of twice a day PPI therapy per GI recommendation followed by once daily therapy. She will be appropriate for discharge this afternoon. -Continue pantoprazole 40 mg BID PO 14 days   ESRD 2/2 IgA Nephropathy Patient will have dialysis today. I spoke with nephrology and they are amenable to the patient being discharged after her dialysis session. -Renal following -HD per nephrology. Left IJ TDC placed and right arm AVF placed 11/22/2016   HTN Slightly hypertensive this morning. Patient finished dialysis yesterday. -Continue home amlodipine 10 mg daily, labetalol 300 mg BID  FEN/GI: NPO at Midnight DVT/PE prophylaxis: Heparin Code: Full code   Ophelia Shoulder, MD 11/23/2016, 3:41 PM Pager: [phone]

## 2016-11-24 NOTE — Discharge Summary (Signed)
Name: Karla Garner MRN: [id number] DOB: 1986/06/02 31 y.o. PCP: No primary care provider on file.  Date of Admission: 11/16/2016  6:46 AM Date of Discharge: 11/24/2016 Attending Physician: Oval Linsey, MD  Discharge Diagnosis: 1. Thrombosis of arteriovenous fistula 2. Epigastric abdominal pain with gastritis 3. 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 Hypertension Principal Problem:   Gastritis and gastroduodenitis Active Problems:   Glomerulonephritis   ESRD (end stage renal disease) ([place])   Clotted renal dialysis AV graft (HCC)   Thrombosis of arteriovenous fistula (HCC)   Epigastric pain   Abnormal CT scan, small bowel   Discharge Medications: Allergies as of 11/24/2016      Reactions   No Known Allergies       Medication List    STOP taking these medications   sevelamer carbonate 800 MG tablet Commonly known as:  RENVELA     TAKE these medications   amLODipine 10 MG tablet Commonly known as:  NORVASC Take 1 tablet (10 mg total) by mouth at bedtime. What changed:  when to take this   Darbepoetin Alfa 40 MCG/0.4ML Sosy injection Commonly known as:  ARANESP Inject 0.4 mLs (40 mcg total) into the vein every Thursday with hemodialysis. Start taking on:  11/29/2016   doxercalciferol 4 MCG/2ML injection Commonly known as:  HECTOROL Inject 2 mLs (4 mcg total) into the vein every Monday, Wednesday, and Friday with hemodialysis. Start taking on:  11/26/2016   labetalol 300 MG tablet Commonly known as:  NORMODYNE Take 1 tablet (300 mg total) by mouth 2 (two) times daily.   lanthanum 1000 MG chewable tablet Commonly known as:  FOSRENOL Chew 2 tablets (2,000 mg total) by mouth 3 (three) times daily with meals.   multivitamin Tabs tablet Take 1 tablet by mouth at bedtime.   pantoprazole 40 MG tablet Commonly known as:  PROTONIX Take 1 tablet (40 mg total) by mouth 2 (two) times daily.   pantoprazole 40 MG tablet Commonly known as:   PROTONIX Take 1 tablet (40 mg total) by mouth daily. Start taking on:  12/09/2016       Disposition and follow-up:   Ms.Karla Garner was discharged from [facility] in Good condition.  At the hospital follow up visit please address:  1.  Please have an ongoing discussion with the patient regarding her abdominal pain and make appropriate referrals to gastroenterology as needed.  2.  Labs / imaging needed at time of follow-up: Renal function panel, Follow up results of pathology from EGD  3.  Pending labs/ test needing follow-up: Follow up results of pathology from EGD  Follow-up Appointments: Follow-up Information    Servando Snare, MD In 4 weeks.   Specialties:  Vascular Surgery, Cardiology Why:  Office will call you to arrange your appt (sent) Contact information: [address] [place] [address] [place] [facility] Course by problem list: Principal Problem:   Gastritis and gastroduodenitis Active Problems:   Glomerulonephritis   ESRD (end stage renal disease) ([place])   Clotted renal dialysis AV graft ([place])   Thrombosis of arteriovenous fistula (HCC)   Epigastric pain   Abnormal CT scan, small bowel   1. Thrombosis of arteriovenous fistula The patient presented to the [facility] emergency department on 11/16/2016 with a thrombosed fistula that she was using for dialysis access. She was initially taken for thrombectomy but the fistula rethrombosed and required acute dialysis via femoral line which  was placed in the patient's right groin. While inpatient vascular surgery did vein mapping and the patient underwent a right arteriovenous fistula placement. A left IJ TDC was also placed for temporary dialysis while her fistula matures. Her normal dialysis schedule was TTS however she got off schedule due to hyperkalemia requiring dialysis on 11/23/2016. She did have dialysis on the day of discharge which was 11/24/2016 getting her back on her  normal dialysis schedule. She had minor pain and soreness of her right arm following fistula formation. She will be able to discharge with ongoing dialysis on a TTS schedule.  2. Epigastric abdominal pain with gastritis Also at the time of admission the patient was complaining of epigastric abdominal pain. She stated the pain had progressed over the previous several weeks. A CT scan obtained showed pancreatic and duodenal edema lipase was negative. She was started on a twice a day PPI for the treatment of this. She also had a HIDA scan which was negative. Following her negative HIDA scan without a clear etiology of the patient's pain gastroenterology was consulted and EGD was performed. EGD did not show evidence of ulceration in the stomach or duodenum. However diffuse inflammation and gastritis was noted in the antrum, pylorus and proximal segments of the duodenum. The exact etiology of the patient's abdominal pain is unclear although it may represent lipase negative acute pancreatitis or gastritis from other underlying etiology. Per GI recommendation she'll be discharged on a proton pump inhibitor twice a day for two weeks followed by once daily use. She will benefit from ongoing management and evaluation of this in the outpatient setting. On the day of discharge the patient did say her abdominal pain had greatly improved from presentation. Lastly, please follow-up with the patient's pathology following her EGD.  3. 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 At the time of presentation she had a thrombosis of arteriovenous fistula. She is currently getting dialysis through a left IJ TDC. She also underwent right arteriovenous fistula placement. She did get off her normal dialysis schedule as outlined above but had dialysis on 11/24/2016. She'll be discharged to resume her normal dialysis schedule.  4. Hypertension Patient has  a history of hypertension associated with her end-stage renal disease and IgA nephropathy. She remained normotensive with several episodes of slight hypertension while in the inpatient setting. She'll be discharged on home amlodipine 10 mg daily and labetalol 300 mg twice a day.  Discharge Vitals:   BP 112/76 (BP Location: Left Arm)   Pulse 86   Temp 98.2 F (36.8 C)   Resp 20   Ht 5\' 4"  (1.626 m)   Wt 97 lb 14.4 oz (44.4 kg)   LMP  (LMP Unknown) Comment: pt asleep, LMP unknown, was shielded  SpO2 99%   BMI 16.80 kg/m   Pertinent Labs, Studies, and Procedures:  1. CT abdomen-pancreatic and duodenal edema and inflammation. 2. EGD-no evidence of ulceration in the stomach or duodenum. Diffuse minimal edema and gastritis identified in the antrum, pylorus and proximal segments of the duodenum. 3. Left IJ TDC 4. Right arteriovenous fistula placement  Discharge Instructions: Discharge Instructions    Diet - low sodium heart healthy    Complete by:  As directed    Discharge instructions    Complete by:  As directed    You have gastritis which is inflammation of your stomach. We are treating you with a medication called Protonix. You're to take  this medication once in the morning and once at night for 2 weeks. After 2 weeks you are to take this medicine just once daily. You will take it once a day for an additional 4 weeks. Thus, you will be on this medication for a total of 6 weeks.  Also, it is important that you follow up with your primary care doctor if you continue to have pain in your stomach. You may need to see a stomach doctor also call the gastroenterologist if you continue to have pain.  Also, please ensure you continue to go to dialysis as scheduled and as the kidney doctors have discussed with you.  Please take your other medications as they have been prescribed. It is very important that you take all of your medications.   Increase activity slowly    Complete by:  As directed        Signed: Ophelia Shoulder, MD 11/24/2016, 3:03 PM   Pager: [phone]

## 2016-11-24 NOTE — Interval H&P Note (Signed)
History and Physical Interval Note:  11/24/2016 8:58 AM  Karla Garner  has presented today for surgery, with the diagnosis of abd pain  The various methods of treatment have been discussed with the patient and family. After consideration of risks, benefits and other options for treatment, the patient has consented to  Procedure(s): ESOPHAGOGASTRODUODENOSCOPY (EGD) (N/A) as a surgical intervention .  The patient's history has been reviewed, patient examined, no change in status, stable for surgery.  I have reviewed the patient's chart and labs.  Questions were answered to the patient's satisfaction.     Pricilla Riffle. [place] Plan

## 2016-11-24 NOTE — Anesthesia Preprocedure Evaluation (Signed)
Anesthesia Evaluation  Patient identified by MRN, date of birth, ID band Patient awake    Reviewed: Allergy & Precautions, NPO status , Patient's Chart, lab work & pertinent test results, reviewed documented beta blocker date and time   History of Anesthesia Complications (+) PONV  Airway Mallampati: II  TM Distance: >3 FB Neck ROM: Full    Dental  (+) Dental Advisory Given, Teeth Intact   Pulmonary neg pulmonary ROS,    breath sounds clear to auscultation       Cardiovascular hypertension, Pt. on medications and Pt. on home beta blockers  Rhythm:Regular Rate:Normal     Neuro/Psych Depression negative neurological ROS     GI/Hepatic negative GI ROS, Neg liver ROS,   Endo/Other    Renal/GU ESRFRenal disease     Musculoskeletal   Abdominal   Peds  Hematology  (+) anemia ,   Anesthesia Other Findings   Reproductive/Obstetrics                             Lab Results  Component Value Date   WBC 9.7 11/23/2016   HGB 9.3 (L) 11/23/2016   HCT 28.7 (L) 11/23/2016   MCV 91.1 11/23/2016   PLT 237 11/23/2016   Lab Results  Component Value Date   CREATININE 7.94 (H) 11/23/2016   BUN 38 (H) 11/23/2016   NA 132 (L) 11/23/2016   K 6.4 (HH) 11/23/2016   CL 102 11/23/2016   CO2 22 11/23/2016    Anesthesia Physical  Anesthesia Plan  ASA: III  Anesthesia Plan: MAC   Post-op Pain Management:    Induction: Intravenous  Airway Management Planned:   Additional Equipment:   Intra-op Plan:   Post-operative Plan:   Informed Consent: I have reviewed the patients History and Physical, chart, labs and discussed the procedure including the risks, benefits and alternatives for the proposed anesthesia with the patient or authorized representative who has indicated his/her understanding and acceptance.   Dental advisory given  Plan Discussed with: CRNA, Anesthesiologist and  Surgeon  Anesthesia Plan Comments:         Anesthesia Quick Evaluation

## 2016-11-24 NOTE — Op Note (Signed)
[facility] Patient Name: Karla Garner Procedure Date : 11/24/2016 MRN: [id number] Attending MD: [name] , MD Date of Birth: November 07, 1985 CSN: [id number] Age: 31 Admit Type: Inpatient Procedure:                Upper GI endoscopy Indications:              Epigastric abdominal pain, Abnormal CT of the GI                            tract Providers:                Pricilla Riffle. Fuller Plan, MD, Carolynn Comment, RN, Alfonso Patten, Technician, Clearnce Sorrel, CRNA, Mardene Celeste                            "Trish" Durene Romans, CRNA Referring MD:             [facility] Medicines:                Monitored Anesthesia Care Complications:            No immediate complications. Estimated Blood Loss:     Estimated blood loss was minimal. Procedure:                Pre-Anesthesia Assessment:                           - Prior to the procedure, a History and Physical                            was performed, and patient medications and                            allergies were reviewed. The patient's tolerance of                            previous anesthesia was also reviewed. The risks                            and benefits of the procedure and the sedation                            options and risks were discussed with the patient.                            All questions were answered, and informed consent                            was obtained. Prior Anticoagulants: The patient has                            taken no previous anticoagulant or antiplatelet  agents. ASA Grade Assessment: III - A patient with                            severe systemic disease. After reviewing the risks                            and benefits, the patient was deemed in                            satisfactory condition to undergo the procedure.                           After obtaining informed consent, the endoscope was   passed under direct vision. Throughout the                            procedure, the patient's blood pressure, pulse, and                            oxygen saturations were monitored continuously. The                            EG-[id number] ([id number]) scope was introduced through the                            mouth, and advanced to the second part of duodenum.                            The upper GI endoscopy was accomplished without                            difficulty. The patient tolerated the procedure                            well. Scope In: Scope Out: Findings:      The examined esophagus was normal.      Localized mild inflammation characterized by erosions and friability was       found in the gastric fundus and in the prepyloric region of the stomach.       Biopsies were taken with a cold forceps for histology.      The exam of the stomach was otherwise normal.      Localized mild inflammation characterized by erosions and erythema was       found in the duodenal bulb and in the second portion of the duodenum. Impression:               - Normal esophagus.                           - Mild gastritis. Biopsied.                           - Mild duodenitis. Moderate Sedation:      none/MAC Recommendation:           - Return patient to hospital ward for ongoing care.                           -  Resume previous diet.                           - Continue present medications.                           - Continue PPI bid for 2 weeks then PPI qam                           - Await pathology results. Procedure Code(s):        --- Professional ---                           [phone], Esophagogastroduodenoscopy, flexible,                            transoral; with biopsy, single or multiple Diagnosis Code(s):        --- Professional ---                           K29.70, Gastritis, unspecified, without bleeding                           K29.80, Duodenitis without bleeding                            R10.13, Epigastric pain                           R93.3, Abnormal findings on diagnostic imaging of                            other parts of digestive tract CPT copyright 2016 [employer]. All rights reserved. The codes documented in this report are preliminary and upon coder review may  be revised to meet current compliance requirements. [name], MD 11/24/2016 9:18:08 AM This report has been signed electronically. Number of Addenda: 0

## 2016-11-24 NOTE — Anesthesia Postprocedure Evaluation (Signed)
Anesthesia Post Note  Patient: Karla Garner  Procedure(s) Performed: Procedure(s) (LRB): ESOPHAGOGASTRODUODENOSCOPY (EGD) (N/A)  Patient location during evaluation: PACU Anesthesia Type: MAC Level of consciousness: awake and alert Pain management: pain level controlled Vital Signs Assessment: post-procedure vital signs reviewed and stable Respiratory status: spontaneous breathing Cardiovascular status: stable Anesthetic complications: no       Last Vitals:  Vitals:   11/24/16 0920 11/24/16 0935  BP: 117/82 112/76  Pulse: (!) 105 86  Resp: (!) 28 20  Temp: 36.8 C 36.8 C    Last Pain:  Vitals:   11/24/16 0821  TempSrc: Oral  PainSc:                  Nolon Nations

## 2016-11-24 NOTE — Progress Notes (Signed)
Pt was discharged home at 2155. Discharge instructions was explained via interpreter. Pt verbalized understanding. Vitals stable. Iv was removed. Pt was pushed down to families vehicle via wheelchair and helped into the car. Also, this nurse spoke with MD on call regarding pain medication d/t pt stating Tylenol did not work for her at home. MD stated that pt can also use Ibuprofen. Pt verbalized understanding.   Eleanora Neighbor, RN

## 2016-11-24 NOTE — Progress Notes (Signed)
[facility] Progress Note   Subjective:   Seen in room, s/p EGD this morning showing gastritis and duodenitis. Pain in L chest and R arm are much improved today. Denies dyspnea.   Objective Vitals:   11/24/16 0613 11/24/16 0821 11/24/16 0920 11/24/16 0935  BP: (!) 146/66 130/82 117/82 112/76  Pulse: 93 91 (!) 105 86  Resp: 16 13 (!) 28 20  Temp: 98.5 F (36.9 C) 98.5 F (36.9 C) 98.2 F (36.8 C) 98.2 F (36.8 C)  TempSrc: Oral Oral    SpO2: 98% 98% 100% 99%  Weight:      Height:       Physical Exam General: Well appearing female, NAD.  Heart: RRR; no murmur Lungs: CTAB Abdomen: soft, non-tender Extremities: No LE edema Dialysis Access: TDC in L chest. New RUE AVF + tender, + thrill.  Additional Objective Labs: Basic Metabolic Panel:  Recent Labs Lab 11/20/16 0650 11/21/16 0745 11/22/16 0904 11/23/16 1010  NA 138 135 132* 132*  K 4.2 4.3 4.9 6.4*  CL 101 98*  --  102  CO2 27 26  --  22  GLUCOSE 91 94 94 94  BUN 16 45*  --  38*  CREATININE 5.12* 8.25*  --  7.94*  CALCIUM 9.0 9.5  --  9.4  PHOS 5.6* 6.3*  --  6.2*   Liver Function Tests:  Recent Labs Lab 11/20/16 0650 11/21/16 0745 11/23/16 1010  ALBUMIN 2.5* 2.4* 2.7*    Recent Labs Lab 11/18/16 1136  AMYLASE 145*   CBC:  Recent Labs Lab 11/19/16 0505 11/21/16 0745 11/22/16 0904 11/23/16 1010  WBC 6.4 6.9  --  9.7  HGB 8.9* 8.5* 9.9* 9.3*  HCT 26.9* 26.7* 29.0* 28.7*  MCV 89.4 91.8  --  91.1  PLT 151 184  --  237   Studies/Results: Nm Hepatobiliary Liver Func  Result Date: 11/23/2016 CLINICAL DATA:  Abdominal pain . EXAM: NUCLEAR MEDICINE HEPATOBILIARY IMAGING TECHNIQUE: Sequential images of the abdomen were obtained out to 60 minutes following intravenous administration of radiopharmaceutical. RADIOPHARMACEUTICALS:  5.2 mCi Tc-41m  Choletec IV COMPARISON:  Ultrasound 11/16/2016. FINDINGS: Prompt uptake and biliary excretion of activity by the liver is seen. Gallbladder  activity is visualized, consistent with patency of cystic duct. Biliary activity passes into small bowel, consistent with patent common bile duct. IMPRESSION: Normal exam. Electronically Signed   By: Marcello Moores  Register   On: 11/23/2016 08:50   Dg Chest Port 1 View  Result Date: 11/22/2016 CLINICAL DATA:  Status post dialysis catheter insertion. EXAM: PORTABLE CHEST 1 VIEW COMPARISON:  03/26/2016 FINDINGS: A left-sided subclavian approach dialysis catheter terminates in the mid right atrium. Numerous leads and wires project over the chest. Midline trachea. Normal heart size. No pleural effusion or pneumothorax. No congestive failure. IMPRESSION: Dialysis catheter terminating in the right atrium.  No pneumothorax. Electronically Signed   By: Abigail Miyamoto M.D.   On: 11/22/2016 15:48   Dg Fluoro Guide Cv Line-no Report  Result Date: 11/22/2016 Fluoroscopy was utilized by the requesting physician.  No radiographic interpretation.   Medications:  . amLODipine  10 mg Oral QHS  . [START ON 11/28/2016] darbepoetin (ARANESP) injection - DIALYSIS  40 mcg Intravenous Q Wed-HD  . doxercalciferol  4 mcg Intravenous Q M,W,F-HD  . feeding supplement  1 Container Oral TID BM  . heparin  5,000 Units Subcutaneous Q8H  . labetalol  300 mg Oral BID  . lanthanum  2,000 mg Oral TID WC  . multivitamin  1 tablet Oral QHS  . pantoprazole  40 mg Oral BID  . sodium chloride flush  3 mL Intravenous Q12H    Dialysis Orders: TTS at [facility] 3:45 hours, BFR350/DFR A1.5, EDW 43.2kg, 2K/3.5Ca bath - Heparin 1700units q HD - Aranesp 55mcg q wk (last 1/23) - venofer 50mg  q week - Hectoral 31mcg IV q HD  Assessment/Plan: 1. Pancreatitis v. PUD: LFTs ok, CT findings c/w pancreatitis, but normal lipase level. Work-up per primary. S/p normal HIDA scan, EGD 2/3 showed mild gastritis and duodenitis. 2. ESRD: HD off schedule this week, and then required HD 2/2 d/t high K. Getting back on TTS schedule with HD today. 3. HTN/volume: BP  ok. Low UF as tolerated. 4. Anemia: Hgb 9.3. Continue Aranesp 78mcg weekly. 5. Secondary hyperparathyroidism: Ca ok, Phos high. Continue binders/hectoral for now. 6. Nutrition:  Alb 2.7, continue supplements. 7. Hx clotted AVF (recurrent): Now s/p L IJ TDC and RUE AVF 2/1 by Dr. Bridgett Larsson. Pain improving today.  Veneta Penton, PA-C 11/24/2016, 10:15 AM  [employer] Pager: [phone]  Patient seen and examined. Agree with PA AP. Nausea still present but definitely improved (likely from the anesthesia) Rt BCF good bruit. Lt IJ TC  Otelia Santee, MD [facility]

## 2016-11-25 ENCOUNTER — Encounter (HOSPITAL_COMMUNITY): Payer: Self-pay | Admitting: Gastroenterology

## 2016-11-28 ENCOUNTER — Encounter: Payer: Self-pay | Admitting: Gastroenterology

## 2016-11-28 DIAGNOSIS — T829XXA Unspecified complication of cardiac and vascular prosthetic device, implant and graft, initial encounter: Secondary | ICD-10-CM | POA: Insufficient documentation

## 2016-12-28 ENCOUNTER — Encounter: Payer: Self-pay | Admitting: Vascular Surgery

## 2016-12-28 ENCOUNTER — Other Ambulatory Visit: Payer: Self-pay

## 2016-12-28 DIAGNOSIS — Z48812 Encounter for surgical aftercare following surgery on the circulatory system: Secondary | ICD-10-CM

## 2016-12-28 DIAGNOSIS — Z992 Dependence on renal dialysis: Secondary | ICD-10-CM

## 2016-12-28 DIAGNOSIS — N186 End stage renal disease: Secondary | ICD-10-CM

## 2016-12-31 ENCOUNTER — Encounter (HOSPITAL_COMMUNITY): Payer: Self-pay

## 2017-01-04 ENCOUNTER — Encounter: Payer: Self-pay | Admitting: Vascular Surgery

## 2017-01-14 ENCOUNTER — Encounter (HOSPITAL_COMMUNITY): Payer: Self-pay

## 2017-01-25 ENCOUNTER — Encounter: Payer: Self-pay | Admitting: Vascular Surgery

## 2017-03-04 ENCOUNTER — Other Ambulatory Visit (HOSPITAL_COMMUNITY): Payer: Self-pay | Admitting: Nephrology

## 2017-03-04 DIAGNOSIS — N186 End stage renal disease: Secondary | ICD-10-CM

## 2017-03-06 ENCOUNTER — Ambulatory Visit (HOSPITAL_COMMUNITY)
Admission: RE | Admit: 2017-03-06 | Discharge: 2017-03-06 | Disposition: A | Discharge status: Home / Self Care | Payer: Medicaid Other | Source: Ambulatory Visit | Attending: Nephrology | Admitting: Nephrology

## 2017-03-06 ENCOUNTER — Encounter (HOSPITAL_COMMUNITY): Payer: Self-pay | Admitting: Interventional Radiology

## 2017-03-06 DIAGNOSIS — N186 End stage renal disease: Secondary | ICD-10-CM

## 2017-03-06 DIAGNOSIS — Z4901 Encounter for fitting and adjustment of extracorporeal dialysis catheter: Secondary | ICD-10-CM | POA: Insufficient documentation

## 2017-03-06 HISTORY — PX: IR REMOVAL TUN CV CATH W/O FL: IMG2289

## 2017-03-06 MED ORDER — DIAZEPAM 5 MG PO TABS
10.0000 mg | ORAL_TABLET | Freq: Once | ORAL | Status: AC
  Administered 2017-03-06: 10 mg via ORAL

## 2017-03-06 MED ORDER — LIDOCAINE HCL 1 % IJ SOLN
INTRAMUSCULAR | Status: AC
  Filled 2017-03-06: qty 20

## 2017-03-06 MED ORDER — CHLORHEXIDINE GLUCONATE 4 % EX LIQD
CUTANEOUS | Status: AC
  Filled 2017-03-06: qty 15

## 2017-03-06 MED ORDER — DIAZEPAM 5 MG PO TABS
ORAL_TABLET | ORAL | Status: AC
  Filled 2017-03-06: qty 2

## 2017-05-06 NOTE — Addendum Note (Signed)
Addendum  created 05/06/17 1654 by Annye Asa, MD   Delete clinical note, Sign clinical note

## 2017-05-06 NOTE — Anesthesia Postprocedure Evaluation (Deleted)
Anesthesia Post Note  Patient: Karla Garner  Procedure(s) Performed: Procedure(s) (LRB): THROMBECTOMY REVISION OF ARTERIOVENOUS FISTULA - RIGHT ARM (Right)     Anesthesia Post Evaluation  Last Vitals:  Vitals:   11/24/16 1900 11/24/16 1922  BP: (!) 150/74 (!) 143/73  Pulse: (!) 106 (!) 107  Resp:  20  Temp:  36.8 C    Last Pain:  Vitals:   11/24/16 2100  TempSrc:   PainSc: 6                  Ashaunte Standley,E. Marcus Schwandt

## 2017-05-06 NOTE — Addendum Note (Signed)
Addendum  created 05/06/17 1517 by Annye Asa, MD   Sign clinical note

## 2017-06-03 ENCOUNTER — Other Ambulatory Visit (HOSPITAL_COMMUNITY): Payer: Self-pay | Admitting: Nephrology

## 2017-06-03 DIAGNOSIS — R079 Chest pain, unspecified: Secondary | ICD-10-CM

## 2017-07-23 ENCOUNTER — Other Ambulatory Visit (HOSPITAL_COMMUNITY): Payer: Self-pay | Admitting: Nephrology

## 2017-07-23 DIAGNOSIS — N186 End stage renal disease: Secondary | ICD-10-CM

## 2017-07-25 ENCOUNTER — Other Ambulatory Visit: Payer: Self-pay | Admitting: General Surgery

## 2017-07-26 ENCOUNTER — Ambulatory Visit (HOSPITAL_COMMUNITY)
Admission: RE | Admit: 2017-07-26 | Discharge: 2017-07-26 | Disposition: A | Discharge status: Home / Self Care | Payer: Self-pay | Source: Ambulatory Visit | Attending: Nephrology | Admitting: Nephrology

## 2017-07-26 ENCOUNTER — Encounter (HOSPITAL_COMMUNITY): Payer: Self-pay | Admitting: Interventional Radiology

## 2017-07-26 DIAGNOSIS — Z4901 Encounter for fitting and adjustment of extracorporeal dialysis catheter: Secondary | ICD-10-CM | POA: Insufficient documentation

## 2017-07-26 DIAGNOSIS — I82291 Chronic embolism and thrombosis of other thoracic veins: Secondary | ICD-10-CM | POA: Insufficient documentation

## 2017-07-26 DIAGNOSIS — N186 End stage renal disease: Secondary | ICD-10-CM | POA: Insufficient documentation

## 2017-07-26 HISTORY — PX: IR DIALY SHUNT INTRO NEEDLE/INTRACATH INITIAL W/IMG RIGHT: IMG6115

## 2017-07-26 MED ORDER — LIDOCAINE HCL 1 % IJ SOLN
INTRAMUSCULAR | Status: AC
  Filled 2017-07-26: qty 20

## 2017-07-26 MED ORDER — IOPAMIDOL (ISOVUE-300) INJECTION 61%
INTRAVENOUS | Status: AC
  Administered 2017-07-26: 60 mL
  Filled 2017-07-26: qty 100

## 2017-07-26 NOTE — Procedures (Signed)
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

## 2017-08-01 ENCOUNTER — Emergency Department (HOSPITAL_COMMUNITY)
Admission: EM | Admit: 2017-08-01 | Discharge: 2017-08-01 | Disposition: A | Discharge status: Home / Self Care | Payer: Self-pay | Attending: Emergency Medicine | Admitting: Emergency Medicine

## 2017-08-01 ENCOUNTER — Encounter (HOSPITAL_COMMUNITY): Payer: Self-pay

## 2017-08-01 ENCOUNTER — Emergency Department (HOSPITAL_COMMUNITY): Payer: Self-pay

## 2017-08-01 DIAGNOSIS — N186 End stage renal disease: Secondary | ICD-10-CM | POA: Insufficient documentation

## 2017-08-01 DIAGNOSIS — Z79899 Other long term (current) drug therapy: Secondary | ICD-10-CM | POA: Insufficient documentation

## 2017-08-01 DIAGNOSIS — I120 Hypertensive chronic kidney disease with stage 5 chronic kidney disease or end stage renal disease: Secondary | ICD-10-CM | POA: Insufficient documentation

## 2017-08-01 DIAGNOSIS — Z992 Dependence on renal dialysis: Secondary | ICD-10-CM | POA: Insufficient documentation

## 2017-08-01 DIAGNOSIS — R079 Chest pain, unspecified: Secondary | ICD-10-CM | POA: Insufficient documentation

## 2017-08-01 LAB — CBC
HCT: 35.4 % — ABNORMAL LOW (ref 36.0–46.0)
Hemoglobin: 11.5 g/dL — ABNORMAL LOW (ref 12.0–15.0)
MCH: 30 pg (ref 26.0–34.0)
MCHC: 32.5 g/dL (ref 30.0–36.0)
MCV: 92.4 fL (ref 78.0–100.0)
Platelets: 181 10*3/uL (ref 150–400)
RBC: 3.83 MIL/uL — ABNORMAL LOW (ref 3.87–5.11)
RDW: 15.2 % (ref 11.5–15.5)
WBC: 6.6 10*3/uL (ref 4.0–10.5)

## 2017-08-01 LAB — I-STAT TROPONIN, ED: Troponin i, poc: 0 ng/mL (ref 0.00–0.08)

## 2017-08-01 LAB — BASIC METABOLIC PANEL
Anion gap: 19 — ABNORMAL HIGH (ref 5–15)
BUN: 69 mg/dL — ABNORMAL HIGH (ref 6–20)
CO2: 21 mmol/L — ABNORMAL LOW (ref 22–32)
Calcium: 9.7 mg/dL (ref 8.9–10.3)
Chloride: 97 mmol/L — ABNORMAL LOW (ref 101–111)
Creatinine, Ser: 12.32 mg/dL — ABNORMAL HIGH (ref 0.44–1.00)
GFR calc Af Amer: 4 mL/min — ABNORMAL LOW (ref >60)
GFR calc non Af Amer: 4 mL/min — ABNORMAL LOW (ref >60)
Glucose, Bld: 85 mg/dL (ref 65–99)
Potassium: 3.5 mmol/L (ref 3.5–5.1)
Sodium: 137 mmol/L (ref 135–145)

## 2017-08-01 LAB — TROPONIN I: Troponin I: <0.03 ng/mL (ref <0.03)

## 2017-08-01 MED ORDER — LABETALOL HCL 5 MG/ML IV SOLN
10.0000 mg | Freq: Once | INTRAVENOUS | Status: AC
  Administered 2017-08-01: 10 mg via INTRAVENOUS
  Filled 2017-08-01: qty 4

## 2017-08-01 MED ORDER — TECHNETIUM TO 99M ALBUMIN AGGREGATED
4.1500 | Freq: Once | INTRAVENOUS | Status: AC | PRN
  Administered 2017-08-01: 4.15 via INTRAVENOUS

## 2017-08-01 MED ORDER — TECHNETIUM TC 99M DIETHYLENETRIAME-PENTAACETIC ACID
31.2000 | Freq: Once | INTRAVENOUS | Status: AC | PRN
  Administered 2017-08-01: 31.2 via INTRAVENOUS

## 2017-08-01 MED ORDER — MORPHINE SULFATE (PF) 4 MG/ML IV SOLN: 4.0000 mg | Freq: Once | INTRAVENOUS | Status: DC

## 2017-08-01 MED ORDER — MORPHINE SULFATE (PF) 4 MG/ML IV SOLN
2.0000 mg | Freq: Once | INTRAVENOUS | Status: AC
  Administered 2017-08-01: 2 mg via INTRAVENOUS
  Filled 2017-08-01: qty 1

## 2017-08-01 MED ORDER — AMLODIPINE BESYLATE 5 MG PO TABS
10.0000 mg | ORAL_TABLET | Freq: Once | ORAL | Status: AC
  Administered 2017-08-01: 10 mg via ORAL
  Filled 2017-08-01: qty 2

## 2017-08-01 NOTE — Progress Notes (Signed)
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 ED evaluation was unremarkable, however there is need to rule out PE  ED provider discussed this case with on-call nephrologist who recommended VQ scan to rule out PE if unable to do a VQ scan or if VQ scan is inconclusive then CTA chest can be done  to rule out PE, if CTA chest is done on 08/01/2017 patient can have hemodialysis around noon tomorrow (08/02/2017)  to remove the contrast.   If either VQ scan or CTA chest is positive for pulmonary embolism ED provider will call hospitalist service to admit patient, otherwise if PE workup is negative patient will be discharged home with outpatient hemodialysis on 08/02/2017.   Please see full note from ED provider  Roxan Hockey, MD

## 2017-08-01 NOTE — Discharge Instructions (Addendum)
You have been scheduled for dialysis at [facility] tomorrow (Friday 10/12) at noon. Please followup at first availability with primary doctor

## 2017-08-01 NOTE — ED Provider Notes (Signed)
Pt care received from Delos Haring, [place]. 31 yo IgA nephropathy and ESRD on HD who p/w syncope and chest pain. Decision to admit based on V/Q scan and potentially CTA PE. V/Q scan negative for PE. Confirmed with Nephrology that patient is scheduled for dialysis tomorrow at [facility]. Pt informed of results. She is stable for close PCP f/u.  Return precautions provided for worsening symptoms. Pt will f/u with PCP at first availability. Pt verbalized agreement with plan.   Payton Emerald, MD 08/02/17 [id number]    Drenda Freeze, MD 08/04/17 1728

## 2017-08-01 NOTE — ED Triage Notes (Addendum)
Pt. Reports having anterior chest pain that radiated into her back.  It started last night and has progressively became worse and constant  Pain is a 6/10  She is nauseated  She is a dialysis pt and her last treatment was Tuesday,  She was due today.    She has a hx of chest pain but today it radiated into her back , neck and head, which is different Pt. Is alert and oriented X4. Skin is warm and dry.  ECG completed in triage along with blood .   Interrupter phone used for this triage.  Pt. Denies any cough or cold symptoms.  She also denies any v/d

## 2017-08-01 NOTE — ED Notes (Signed)
ED Provider at bedside. 

## 2017-08-01 NOTE — ED Notes (Signed)
Patient transported to V/Q scan 

## 2017-08-01 NOTE — ED Provider Notes (Signed)
[facility] DEPT Provider Note   CSN: [id number] Arrival date & time: 08/01/17  1275   History   Chief Complaint Chief Complaint  Patient presents with  . Chest Pain    HPI Karla Garner is a 31 y.o. female.  HPI   Patient interview is done with a spanish interpretor. Story is difficult to follow.  The patient dialyzes TTS at [facility]. Her Nephrologist is Dr. Justin Mend she does not see a primary care doctor. 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   She developed chest pain and pain into her back last night as well as shortness of breath. Her significant other reports that she woke up last night, seemed panicked and then stopped breathing and responding for approximately 1.5 minutes. Reportedly he went to get an alcohol swab but she was waking up already by then. Her significant other said this happened before, one time recently but they did not get her evaluated after that episode because she was not having pain at that time. She was supposed to dialyze today but did not go due to the severity of her pain to the neck and chest.  She had an admission 1/26-2/3 of this year for multiple diagnosis of thrombosis of AV fistula requiring emergent dialysis for evelated potassium.    Past Medical History:  Diagnosis Date  . CKD (chronic kidney disease), stage V ([place])   . Glomerulonephritis   . Hemodialysis patient ([place])    Tu, Th, Sat  . Hypertension   . PONV (postoperative nausea and vomiting)    was on dialysis i during pregnancy2014  . Thrombocytopenia ([place]) 2014    Patient Active Problem List   Diagnosis Date Noted  . Abnormal CT scan, small bowel   . Gastritis and gastroduodenitis   . Epigastric pain   . Acute pancreatitis 11/16/2016  . Clotted renal dialysis AV graft ([place]) 11/16/2016  . Thrombosis of arteriovenous fistula (HCC)   . Sepsis, unspecified organism ([place])   . Bacteremia   . Lactic acidosis   . Sinus  tachycardia   . ESRD on hemodialysis ([place])   . Sepsis ([place]) 08/02/2015  . Vaginal spotting 08/02/2015  . Abdominal pain 08/02/2015  . Thrombocytopenia ([place]) 08/02/2015  . Normocytic anemia 08/02/2015  . Severe major depression, single episode, without psychotic features ([place]) 06/25/2015  . ESRD needing dialysis ([place]) 06/25/2015  . Suicidal ideation   . CKD (chronic kidney disease) stage 5, GFR less than 15 ml/min (HCC) 04/09/2015  . Anemia of chronic kidney failure 04/09/2015  . ESRD (end stage renal disease) ([place]) 04/09/2015  . BP (high blood pressure) 04/30/2013  . Glomerulonephritis, IgA 04/30/2013  . Hyperkalemia 02/24/2013  . Acute on chronic renal failure ([place]) 02/23/2013  . Previous cesarean delivery affecting pregnancy, antepartum 01/07/2013  . Glomerulonephritis 01/07/2013  . Benign essential hypertension antepartum 01/07/2013  . High risk pregnancy due to history of preterm labor 01/07/2013    Past Surgical History:  Procedure Laterality Date  . AV FISTULA PLACEMENT Left   . AV FISTULA PLACEMENT Right 04/14/2015   Procedure: CREATION OF RIGHT RADIOCEPHALIC ARTERIOVENOUS (AV) FISTULA ;  Surgeon: Angelia Mould, MD;  Location: [facility];  Service: Vascular;  Laterality: Right;  . AV FISTULA PLACEMENT Right 11/22/2016   Procedure: ARTERIOVENOUS (AV) FISTULA CREATION;  Surgeon: [name], MD;  Location: [facility];  Service: Vascular;  Laterality: Right;  . CESAREAN SECTION     2009  . ESOPHAGOGASTRODUODENOSCOPY N/A 11/24/2016  Procedure: ESOPHAGOGASTRODUODENOSCOPY (EGD);  Surgeon: [name], MD;  Location: [facility] ENDOSCOPY;  Service: Endoscopy;  Laterality: N/A;  . EXCHANGE OF A DIALYSIS CATHETER Right 04/14/2015   Procedure: EXCHANGE OF RIGHT INTERNAL JUGULAR DIALYSIS CATHETER;  Surgeon: Angelia Mould, MD;  Location: [facility];  Service: Vascular;  Laterality: Right;  . INSERTION OF DIALYSIS CATHETER Left 11/22/2016   Procedure: INSERTION OF DIALYSIS CATHETER  - LEFT INTERNAL JUGULAR PLACEMENT;  Surgeon: [name], MD;  Location: [facility];  Service: Vascular;  Laterality: Left;  . IR DIALY SHUNT INTRO [place] W/IMG RIGHT Right 07/26/2017  . IR REMOVAL TUN CV CATH W/O FL  03/06/2017  . LIGATION OF ARTERIOVENOUS  FISTULA Left 11/05/2014   Procedure: LIGATION OF LEFT ARM  BRACHIO-CEPHALIC ARTERIOVENOUS  FISTULA ,& REPAIR OF BRACHIAL ARTERY.;  Surgeon: Mal [name], MD;  Location: [facility];  Service: Vascular;  Laterality: Left;  . THROMBECTOMY W/ EMBOLECTOMY Right 11/16/2016   Procedure: THROMBECTOMY REVISION OF ARTERIOVENOUS FISTULA - RIGHT ARM;  Surgeon: [name], MD;  Location: [place];  Service: Vascular;  Laterality: Right;  . TUBAL LIGATION  2014    OB History    Gravida Para Term Preterm AB Living   3 3 1 2   2    SAB TAB Ectopic Multiple Live Births           2       Home Medications    Prior to Admission medications   Medication Sig Start Date End Date Taking? Authorizing Provider  amLODipine (NORVASC) 10 MG tablet Take 1 tablet (10 mg total) by mouth at bedtime. Patient not taking: Reported on 08/01/2017 11/24/16   Ophelia Shoulder, MD  Darbepoetin Alfa (ARANESP) 40 MCG/0.4ML SOSY injection Inject 0.4 mLs (40 mcg total) into the vein every Thursday with hemodialysis. Patient not taking: Reported on 08/01/2017 11/29/16   Ophelia Shoulder, MD  doxercalciferol (HECTOROL) 4 MCG/2ML injection Inject 2 mLs (4 mcg total) into the vein every Monday, Wednesday, and Friday with hemodialysis. Patient not taking: Reported on 08/01/2017 11/26/16   Ophelia Shoulder, MD  labetalol (NORMODYNE) 300 MG tablet Take 1 tablet (300 mg total) by mouth 2 (two) times daily. Patient not taking: Reported on 08/01/2017 11/24/16   Ophelia Shoulder, MD  lanthanum (FOSRENOL) 1000 MG chewable tablet Chew 2 tablets (2,000 mg total) by mouth 3 (three) times daily with meals. Patient not taking: Reported on 08/01/2017 11/24/16   Ophelia Shoulder, MD  multivitamin  (RENA-VIT) TABS tablet Take 1 tablet by mouth at bedtime. Patient not taking: Reported on 08/01/2017 11/24/16   Ophelia Shoulder, MD  pantoprazole (PROTONIX) 40 MG tablet Take 1 tablet (40 mg total) by mouth 2 (two) times daily. 11/24/16 12/08/16  Ophelia Shoulder, MD  pantoprazole (PROTONIX) 40 MG tablet Take 1 tablet (40 mg total) by mouth daily. 12/09/16 01/06/17  Ophelia Shoulder, MD    Family History No family history on file.  Social History Social History  Substance Use Topics  . Smoking status: Never Smoker  . Smokeless tobacco: Never Used  . Alcohol use No     Allergies   No known allergies   Review of Systems Review of Systems Negative ROS aside from pertinent positives and negatives as listed in HPI   Physical Exam Updated Vital Signs BP (!) 180/111   Pulse (!) 103   Temp 98.4 F (36.9 C) (Oral)   Resp 19   Ht 5' (1.524 m)   LMP 07/19/2017 (Exact Date)   SpO2  97%   Physical Exam  Constitutional: She appears well-developed and well-nourished. No distress.  She does have some puffiness of her face.  HENT:  Head: Normocephalic and atraumatic.  Right Ear: Tympanic membrane and ear canal normal.  Left Ear: Tympanic membrane and ear canal normal.  Nose: Nose normal.  Mouth/Throat: Uvula is midline, oropharynx is clear and moist and mucous membranes are normal.  Eyes: Pupils are equal, round, and reactive to light.  Neck: Normal range of motion. Neck supple.  Cardiovascular: Normal rate and regular rhythm.   Pulmonary/Chest: Effort normal.  Abdominal: Soft.  No signs of abdominal distention  Musculoskeletal:  No LE swelling  Neurological: She is alert.  Acting at baseline  Skin: Skin is warm and dry. No rash noted.  Nursing note and vitals reviewed.    ED Treatments / Results  Labs (all labs ordered are listed, but only abnormal results are displayed) Labs Reviewed  BASIC METABOLIC PANEL - Abnormal; Notable for the following:       Result Value   Chloride 97  (*)    CO2 21 (*)    BUN 69 (*)    Creatinine, Ser 12.32 (*)    GFR calc non Af Amer 4 (*)    GFR calc Af Amer 4 (*)    Anion gap 19 (*)    All other components within normal limits  CBC - Abnormal; Notable for the following:    RBC 3.83 (*)    Hemoglobin 11.5 (*)    HCT 35.4 (*)    All other components within normal limits  I-STAT TROPONIN, ED    EKG  EKG Interpretation  Date/Time:  Thursday August 01 2017 10:00:46 EDT Ventricular Rate:  103 PR Interval:  144 QRS Duration: 76 QT Interval:  372 QTC Calculation: 487 R Axis:   80 Text Interpretation:  Sinus tachycardia Otherwise normal ECG Confirmed by Virgel Manifold [phone]) on 08/01/2017 11:45:24 AM       Radiology Dg Chest 2 View  Result Date: 08/01/2017 CLINICAL DATA:  Chest pain EXAM: CHEST  2 VIEW COMPARISON:  11/22/2016 FINDINGS: Mild cardiomegaly. Interstitial prominence within the lungs. No focal opacity or effusion. No acute bony abnormality. IMPRESSION: Cardiomegaly. Slight interstitial prominence, possible bronchitic changes. Electronically Signed   By: Rolm Baptise M.D.   On: 08/01/2017 10:42    Procedures Procedures (including critical care time)  Medications Ordered in ED Medications - No data to display   Initial Impression / Assessment and Plan / ED Course  I have reviewed the triage vital signs and the nursing notes.  Pertinent labs & imaging results that were available during my care of the patient were reviewed by me and considered in my medical decision making (see chart for details).  1:59 pm Ms. Melburn Hake had a witnessed syncopal episode last night with associated chest pain and back pain. She missed dialysis, has a normal potassium but has an anion gap of 19. She continues to not feel well, reports feeling swollen more than normal.  She has had two negative troponins her blood pressure is poorly controlled as well. She needs dialysis as scheduled.   Will order a VQ scan to evaluate for PE.  Discussed case with Nephrology, First choice is VQ scan to r/o PE, is this is inconclusive she can get a CTA. If this is negative she has been scheduled for dialysis at 12 pm tomorrow, which is fine per nephrology after contrast load. If her VQ or CT angio are negative  for PE she can go home. If either one is positive she will require admission, I spoke with Dr. Denton Brick, if she is positive for PE he will admit, his number is on amion.  Pt and Family members are aware of plan.  3:24 pm Sign out at end of shift to ER Resident.  Final Clinical Impressions(s) / ED Diagnoses   Final diagnoses:  None    New Prescriptions New Prescriptions   No medications on file     Delos Haring, Hershal Coria 08/01/17 1525    Virgel Manifold, MD 08/07/17 1000

## 2017-08-12 ENCOUNTER — Other Ambulatory Visit (HOSPITAL_COMMUNITY): Payer: Self-pay | Admitting: Nephrology

## 2017-08-12 DIAGNOSIS — N186 End stage renal disease: Secondary | ICD-10-CM

## 2017-08-13 ENCOUNTER — Other Ambulatory Visit: Payer: Self-pay | Admitting: Physician Assistant

## 2017-08-13 ENCOUNTER — Other Ambulatory Visit: Payer: Self-pay | Admitting: Radiology

## 2017-08-14 ENCOUNTER — Ambulatory Visit (HOSPITAL_COMMUNITY)
Admission: RE | Admit: 2017-08-14 | Discharge: 2017-08-14 | Disposition: A | Discharge status: Home / Self Care | Payer: Self-pay | Source: Ambulatory Visit | Attending: Nephrology | Admitting: Nephrology

## 2017-08-14 ENCOUNTER — Encounter (HOSPITAL_COMMUNITY): Payer: Self-pay

## 2017-08-14 DIAGNOSIS — N186 End stage renal disease: Secondary | ICD-10-CM

## 2017-08-14 NOTE — Progress Notes (Signed)
Patient presents today for fistulagram secondary to RUE and facial swelling to rule out central stenosis.  The patient just had this completed on 10/5 and the procedure is documented in the imaging section.  She has a chronic central occlusion, but great collateral flow.  Her access is working well on HD with good outflow.  I have contacted Dr. Justin Mend who was unaware this was reordered.  After discussion he does not feel as if this needs to be repeated as there is no further intervention on our part that can be done currently.  The patient understands this as she was questioning herself why she had returned today.  The procedure was cancelled and she was sent home.    Karla Garner E 8:07 AM 08/14/2017

## 2017-10-16 ENCOUNTER — Other Ambulatory Visit (HOSPITAL_COMMUNITY): Payer: Self-pay | Admitting: Nephrology

## 2017-10-16 DIAGNOSIS — N186 End stage renal disease: Secondary | ICD-10-CM

## 2017-10-18 ENCOUNTER — Encounter (HOSPITAL_COMMUNITY): Payer: Self-pay | Admitting: Interventional Radiology

## 2017-10-18 ENCOUNTER — Other Ambulatory Visit (HOSPITAL_COMMUNITY): Payer: Self-pay | Admitting: Nephrology

## 2017-10-18 ENCOUNTER — Ambulatory Visit (HOSPITAL_COMMUNITY)
Admission: RE | Admit: 2017-10-18 | Discharge: 2017-10-18 | Disposition: A | Discharge status: Home / Self Care | Payer: Self-pay | Source: Ambulatory Visit | Attending: Nephrology | Admitting: Nephrology

## 2017-10-18 DIAGNOSIS — N186 End stage renal disease: Secondary | ICD-10-CM | POA: Insufficient documentation

## 2017-10-18 DIAGNOSIS — Y832 Surgical operation with anastomosis, bypass or graft as the cause of abnormal reaction of the patient, or of later complication, without mention of misadventure at the time of the procedure: Secondary | ICD-10-CM | POA: Insufficient documentation

## 2017-10-18 DIAGNOSIS — Z992 Dependence on renal dialysis: Secondary | ICD-10-CM | POA: Insufficient documentation

## 2017-10-18 DIAGNOSIS — T82898A Other specified complication of vascular prosthetic devices, implants and grafts, initial encounter: Secondary | ICD-10-CM | POA: Insufficient documentation

## 2017-10-18 HISTORY — PX: IR DIALY SHUNT INTRO NEEDLE/INTRACATH INITIAL W/IMG RIGHT: IMG6115

## 2017-10-18 MED ORDER — IOPAMIDOL (ISOVUE-300) INJECTION 61%
INTRAVENOUS | Status: AC
  Administered 2017-10-18: 24 mL
  Filled 2017-10-18: qty 100

## 2017-10-18 NOTE — Procedures (Signed)
Interventional Radiology Procedure Note  Procedure: Shunt study of UE fistula for right arm swelling.    Findings: Patient fistula.   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  No focal outflow lesion to address other than the chronic central vein occlusion.  .  Complications: None  Recommendations:  - No plasty performed.  - Discussed case with Dr. Jimmy Footman.  Improving flow would likely require attempt at revascularization of occluded brachiocephalic vein.  Would be willing to see at [facility] clinic for consult.    - Routine wound care - DC home   Signed,  Dulcy Fanny. Earleen Newport, DO

## 2017-11-01 ENCOUNTER — Encounter: Payer: Self-pay | Admitting: Family Medicine

## 2017-11-01 ENCOUNTER — Ambulatory Visit (INDEPENDENT_AMBULATORY_CARE_PROVIDER_SITE_OTHER): Payer: Self-pay | Admitting: Family Medicine

## 2017-11-01 ENCOUNTER — Other Ambulatory Visit: Payer: Self-pay

## 2017-11-01 VITALS — BP 142/80 | HR 97 | Temp 99.0°F | Wt 97.0 lb

## 2017-11-01 DIAGNOSIS — I159 Secondary hypertension, unspecified: Secondary | ICD-10-CM

## 2017-11-01 DIAGNOSIS — F329 Major depressive disorder, single episode, unspecified: Secondary | ICD-10-CM

## 2017-11-01 DIAGNOSIS — F32A Depression, unspecified: Secondary | ICD-10-CM | POA: Insufficient documentation

## 2017-11-01 DIAGNOSIS — K219 Gastro-esophageal reflux disease without esophagitis: Secondary | ICD-10-CM | POA: Insufficient documentation

## 2017-11-01 DIAGNOSIS — R220 Localized swelling, mass and lump, head: Secondary | ICD-10-CM

## 2017-11-01 DIAGNOSIS — N186 End stage renal disease: Secondary | ICD-10-CM

## 2017-11-01 HISTORY — DX: Localized swelling, mass and lump, head: R22.0

## 2017-11-01 MED ORDER — RENA-VITE PO TABS: 1.0000 | ORAL_TABLET | Freq: Every day | ORAL | 0 refills | Status: DC

## 2017-11-01 MED ORDER — AMLODIPINE BESYLATE 10 MG PO TABS: 10.0000 mg | ORAL_TABLET | Freq: Every day | ORAL | 3 refills | Status: DC

## 2017-11-01 MED ORDER — FAMOTIDINE 10 MG PO TABS: 10.0000 mg | ORAL_TABLET | Freq: Every day | ORAL | 0 refills | Status: DC

## 2017-11-01 NOTE — Patient Instructions (Addendum)
Famotidine 10mg  qd tablet once daily for acid reflux.   I have refilled your blood pressure medication.   We will get CT imaging of your neck.  Please come back in 2 weeks to discuss some of your other concerns.

## 2017-11-01 NOTE — Assessment & Plan Note (Signed)
Trial famotidine 10 mg daily at renal dosing to see if patient's chest pain, abdominal pain, nausea improves.

## 2017-11-01 NOTE — Progress Notes (Signed)
Subjective:  Karla Garner is a 32 y.o. female who presents to the [facility] today to establish as a new patient with multiple medical complaints.  Spanish video interpreter used.  HPI:  She states that she has not ever had a PCP.  She only follows with nephrology for dialysis, she sees Dr. Justin Mend.  She was told to establish care by a social worker at her dialysis center as she has had face and neck puffiness.  She states that the right side of her face and neck that have been more swollen than usual and she feels like if she turns her head a certain way that the vein in her right neck pops out and is somewhat painful.  She states she has had multiple catheters placed in both sides of her neck in the past for dialysis access.  She would like to discuss vision problems, chest pain, nausea and abdominal pain, pain with urination, sexual difficulty, and stress.  She feels like the chest pain and nausea and abdominal pain may be related because she has had some symptoms of acid reflux with sour taste and some difficulty swallowing her saliva sometimes.  No vomiting.  Patient also requesting a refill of her amlodipine and multivitamin as she has been out of these for the past 3 days.  ROS: per HPI, otherwise a 10 point review of systems was performed and was negative  PMH:  The following were reviewed and entered/updated in epic: Past Medical History:  Diagnosis Date  . Anemia   . Depression   . ESRD (end stage renal disease) ([place])    from IgA nephritis  . Glomerulonephritis   . Hemodialysis patient ([place])    Tu, Th, Sat  . Hypertension   . PONV (postoperative nausea and vomiting)    was on dialysis i during pregnancy2014  . Thrombocytopenia ([place]) 2014   Patient Active Problem List   Diagnosis Date Noted  . Depression 11/01/2017  . ESRD on hemodialysis ([place])   . Normocytic anemia 08/02/2015  . Anemia of chronic kidney failure 04/09/2015  . ESRD (end stage renal disease) ([place])  04/09/2015  . Hypertension 04/30/2013  . Glomerulonephritis, IgA 04/30/2013  . Glomerulonephritis 01/07/2013   Past Surgical History:  Procedure Laterality Date  . AV FISTULA PLACEMENT Left   . AV FISTULA PLACEMENT Right 04/14/2015   Procedure: CREATION OF RIGHT RADIOCEPHALIC ARTERIOVENOUS (AV) FISTULA ;  Surgeon: Angelia Mould, MD;  Location: [place];  Service: Vascular;  Laterality: Right;  . AV FISTULA PLACEMENT Right 11/22/2016   Procedure: ARTERIOVENOUS (AV) FISTULA CREATION;  Surgeon: [name], MD;  Location: [facility];  Service: Vascular;  Laterality: Right;  . CESAREAN SECTION     2009  . ESOPHAGOGASTRODUODENOSCOPY N/A 11/24/2016   Procedure: ESOPHAGOGASTRODUODENOSCOPY (EGD);  Surgeon: [name], MD;  Location: [facility] ENDOSCOPY;  Service: Endoscopy;  Laterality: N/A;  . EXCHANGE OF A DIALYSIS CATHETER Right 04/14/2015   Procedure: EXCHANGE OF RIGHT INTERNAL JUGULAR DIALYSIS CATHETER;  Surgeon: Angelia Mould, MD;  Location: [place];  Service: Vascular;  Laterality: Right;  . INSERTION OF DIALYSIS CATHETER Left 11/22/2016   Procedure: INSERTION OF DIALYSIS CATHETER - LEFT INTERNAL JUGULAR PLACEMENT;  Surgeon: [name], MD;  Location: [place];  Service: Vascular;  Laterality: Left;  . IR DIALY SHUNT INTRO [place] W/IMG RIGHT Right 07/26/2017  . IR DIALY SHUNT INTRO NEEDLE/INTRACATH INITIAL W/IMG RIGHT Right 10/18/2017  . IR REMOVAL TUN CV CATH W/O FL  03/06/2017  . LIGATION OF ARTERIOVENOUS  FISTULA Left 11/05/2014   Procedure: LIGATION OF LEFT ARM  BRACHIO-CEPHALIC ARTERIOVENOUS  FISTULA ,& REPAIR OF BRACHIAL ARTERY.;  Surgeon: Mal [name], MD;  Location: [place];  Service: Vascular;  Laterality: Left;  . THROMBECTOMY W/ EMBOLECTOMY Right 11/16/2016   Procedure: THROMBECTOMY REVISION OF ARTERIOVENOUS FISTULA - RIGHT ARM;  Surgeon: [name], MD;  Location: [place];  Service: Vascular;  Laterality: Right;  . TUBAL LIGATION  2014     History reviewed. No pertinent family history.  Medications- reviewed and updated Current Outpatient Medications  Medication Sig Dispense Refill  . amLODipine (NORVASC) 10 MG tablet Take 1 tablet (10 mg total) by mouth at bedtime. 30 tablet 3  . multivitamin (RENA-VIT) TABS tablet Take 1 tablet by mouth at bedtime. 30 tablet 0   No current facility-administered medications for this visit.     Allergies-reviewed and updated Allergies  Allergen Reactions  . No Known Allergies     Social History   Socioeconomic History  . Marital status: Married    Spouse name: None  . Number of children: None  . Years of education: None  . Highest education level: None  Social Needs  . Financial resource strain: Not hard at all  . Food insecurity - worry: Never true  . Food insecurity - inability: Never true  . Transportation needs - medical: No  . Transportation needs - non-medical: No  Occupational History  . None  Tobacco Use  . Smoking status: Never Smoker  . Smokeless tobacco: Never Used  Substance and Sexual Activity  . Alcohol use: No    Alcohol/week: 0.0 oz  . Drug use: No  . Sexual activity: Yes    Partners: Male  Other Topics Concern  . None  Social History Narrative   As of February 2018 she has 3 children with whom she lives they are aged 56, 4 and 2.     Objective:  Physical Exam: BP (!) 142/80   Pulse 97   Temp 99 F (37.2 C) (Oral)   Wt 97 lb (44 kg)   LMP 10/28/2017   SpO2 97%   BMI 18.94 kg/m   Gen: NAD, resting comfortably HEENT: Right lower face edema particularly around right cheek and jaw extending down her right neck with some prominent R sided neck vasculature.  CV: RRR with no murmurs appreciated.  Right upper extremity fistula with palpable thrill Pulm: NWOB, CTAB with no crackles, wheezes, or rhonchi GI: Normal bowel sounds present. Soft, Nondistended. TTP over midepigastric area. MSK: no edema, cyanosis, or clubbing noted Skin: warm,  dry. Neuro: grossly normal, moves all extremities Psych: Normal affect and thought content  Assessment/Plan:  Swelling of right side of face Concern for vascular stenosis or congestion due to the multiple procedures patient has had precepted with Dr. McDiarmid who recommended CT imaging of the neck.  Patient is on dialysis and therefore can have contrast imaging.  Hypertension Patient has been off of her amlodipine for the past 3 days and has a slightly elevated blood pressure today.  Restart amlodipine 10 mg daily.  Acid reflux Trial famotidine 10 mg daily at renal dosing to see if patient's chest pain, abdominal pain, nausea improves.    Follow-up in 2 weeks to start discussing some of her multiple medical concerns  Bufford Lope, DO PGY-2, [facility] 11/01/2017 3:43 PM

## 2017-11-01 NOTE — Assessment & Plan Note (Signed)
Patient has been off of her amlodipine for the past 3 days and has a slightly elevated blood pressure today.  Restart amlodipine 10 mg daily.

## 2017-11-01 NOTE — Assessment & Plan Note (Signed)
Concern for vascular stenosis or congestion due to the multiple procedures patient has had precepted with Dr. McDiarmid who recommended CT imaging of the neck.  Patient is on dialysis and therefore can have contrast imaging.

## 2017-11-06 ENCOUNTER — Encounter (HOSPITAL_COMMUNITY): Payer: Self-pay

## 2017-11-06 ENCOUNTER — Ambulatory Visit (HOSPITAL_COMMUNITY)
Admission: RE | Admit: 2017-11-06 | Discharge: 2017-11-06 | Disposition: A | Discharge status: Home / Self Care | Payer: Self-pay | Source: Ambulatory Visit | Attending: Family Medicine | Admitting: Family Medicine

## 2017-11-06 DIAGNOSIS — R220 Localized swelling, mass and lump, head: Secondary | ICD-10-CM | POA: Insufficient documentation

## 2017-11-06 DIAGNOSIS — I871 Compression of vein: Secondary | ICD-10-CM | POA: Insufficient documentation

## 2017-11-06 MED ORDER — IOPAMIDOL (ISOVUE-300) INJECTION 61%: 75.0000 mL | Freq: Once | INTRAVENOUS | Status: DC | PRN

## 2017-11-06 MED ORDER — IOPAMIDOL (ISOVUE-370) INJECTION 76%
75.0000 mL | Freq: Once | INTRAVENOUS | Status: AC | PRN
  Administered 2017-11-06: 75 mL via INTRAVENOUS

## 2017-11-06 MED ORDER — IOPAMIDOL (ISOVUE-300) INJECTION 61%
INTRAVENOUS | Status: AC
  Filled 2017-11-06: qty 150

## 2017-11-06 MED ORDER — IOPAMIDOL (ISOVUE-370) INJECTION 76%
INTRAVENOUS | Status: AC
  Filled 2017-11-06: qty 100

## 2017-11-19 ENCOUNTER — Encounter: Payer: Self-pay | Admitting: Family Medicine

## 2017-11-19 ENCOUNTER — Other Ambulatory Visit: Payer: Self-pay

## 2017-11-19 ENCOUNTER — Other Ambulatory Visit (HOSPITAL_COMMUNITY)
Admission: RE | Admit: 2017-11-19 | Discharge: 2017-11-19 | Disposition: A | Discharge status: Home / Self Care | Payer: Self-pay | Source: Ambulatory Visit | Attending: Family Medicine | Admitting: Family Medicine

## 2017-11-19 ENCOUNTER — Ambulatory Visit (INDEPENDENT_AMBULATORY_CARE_PROVIDER_SITE_OTHER): Payer: Self-pay | Admitting: Family Medicine

## 2017-11-19 VITALS — BP 118/64 | HR 99 | Temp 99.1°F | Wt 93.0 lb

## 2017-11-19 DIAGNOSIS — I871 Compression of vein: Secondary | ICD-10-CM | POA: Insufficient documentation

## 2017-11-19 DIAGNOSIS — Z124 Encounter for screening for malignant neoplasm of cervix: Secondary | ICD-10-CM

## 2017-11-19 DIAGNOSIS — Z0000 Encounter for general adult medical examination without abnormal findings: Secondary | ICD-10-CM

## 2017-11-19 NOTE — Patient Instructions (Addendum)
I have placed a referral for you to go see a vascular surgeon. Please let us know if you have not heard back about scheduling an appointment after 2 weeks.  Get your tetanus booster at the health department  [place], [address], [place] [address]  [phone]

## 2017-11-19 NOTE — Progress Notes (Signed)
    Subjective:  Karla Garner is a 32 y.o. female who presents to the [facility] today for pap smear and follow up on neck  HPI:  R face swelling Long-standing right-sided face and neck swelling.  Has had multiple procedures for dialysis access on both sides of her neck. Has not worsened since last visit although swelling is still significant.  No flushing.  No syncope or presyncope.  No headaches.  No vision changes.  Would like pap smear today.   Agreeable to Tdap  ROS: Per HPI  Objective:  Physical Exam: BP 118/64   Pulse 99   Temp 99.1 F (37.3 C) (Oral)   Wt 93 lb (42.2 kg)   LMP 10/28/2017   SpO2 99%   BMI 18.16 kg/m   Gen: NAD, resting comfortably HEENT: R lower face and neck is edematous with prominent R sided neck vasculature, no significant changes since last visit CV: RRR with no murmurs appreciated Pulm: NWOB, CTAB with no crackles, wheezes, or rhonchi GI: Normal bowel sounds present. Soft, Nontender, Nondistended. MSK: no edema, cyanosis, or clubbing noted Skin: warm, dry Neuro: grossly normal, moves all extremities Psych: Normal affect and thought content Pelvic: cervix with ectropion. Normal external female genitalia   Assessment/Plan:  Subclavian vein stenosis Per CT angio found to have nonspecific edema within the soft tissues of the right supraclavicular region and upper chest. 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 No sign of acute thrombosis or other acute finding. Since she is symptomatic, referred to vascular surgery. Patient has seen both Dr. Donnetta Hutching and Dr Donzetta Matters the past.   Healthcare maintenance Pap smear collected today. Patient advised to go to health department for Tdap as she is self pay.   Bufford Lope, DO PGY-2, [facility] 11/19/2017 3:45 PM

## 2017-11-20 NOTE — Assessment & Plan Note (Addendum)
Per CT angio found to have nonspecific edema within the soft tissues of the right supraclavicular region and upper chest. 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 No sign of acute thrombosis or other acute finding. Since she is symptomatic, referred to vascular surgery. Patient has seen both Dr. Donnetta Hutching and Dr Donzetta Matters the past.

## 2017-11-21 ENCOUNTER — Encounter: Payer: Self-pay | Admitting: Family Medicine

## 2017-11-21 DIAGNOSIS — Z0000 Encounter for general adult medical examination without abnormal findings: Secondary | ICD-10-CM | POA: Insufficient documentation

## 2017-11-21 LAB — CYTOLOGY - PAP
CHLAMYDIA, DNA PROBE: NEGATIVE
DIAGNOSIS: NEGATIVE
Neisseria Gonorrhea: NEGATIVE
Trichomonas: NEGATIVE

## 2017-11-21 NOTE — Assessment & Plan Note (Signed)
Pap smear collected today. Patient advised to go to health department for Tdap as she is self pay.

## 2018-01-03 ENCOUNTER — Encounter: Payer: Self-pay | Admitting: Vascular Surgery

## 2018-01-03 ENCOUNTER — Other Ambulatory Visit: Payer: Self-pay | Admitting: *Deleted

## 2018-01-03 ENCOUNTER — Encounter: Payer: Self-pay | Admitting: *Deleted

## 2018-01-03 ENCOUNTER — Ambulatory Visit (INDEPENDENT_AMBULATORY_CARE_PROVIDER_SITE_OTHER): Payer: Self-pay | Admitting: Vascular Surgery

## 2018-01-03 ENCOUNTER — Other Ambulatory Visit: Payer: Self-pay

## 2018-01-03 VITALS — BP 139/89 | HR 98 | Temp 98.0°F | Resp 18 | Ht 60.0 in | Wt 99.6 lb

## 2018-01-03 DIAGNOSIS — N186 End stage renal disease: Secondary | ICD-10-CM

## 2018-01-03 DIAGNOSIS — Z992 Dependence on renal dialysis: Secondary | ICD-10-CM

## 2018-01-03 NOTE — Progress Notes (Signed)
Patient ID: Karla Garner, female   DOB: 1986/06/08, 32 y.o.   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 female with history of end-stage renal disease currently on dialysis via right arm cephalic vein fistula.  This was placed by me back in February 2018.  She has had subsequent swelling of her right neck and face with collateralization appearing on her chest.  She continues to dialyze via this fistula on Tuesday Thursday and Saturday.  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  No intervention was undertaken at that time.  She now presents with continued swelling that is worse when laying flat.  She says it does contribute to difficulty breathing at times.  She does not take any blood thinners.  She has had multiple catheters in the past and has used all of her access options in her left arm.  Past Medical History:  Diagnosis Date  . Anemia   . Depression   . ESRD (end stage renal disease) ([place])    from IgA nephritis  . Glomerulonephritis   . Hemodialysis patient ([place])    Tu, Th, Sat  . Hypertension   . PONV (postoperative nausea and vomiting)    was on dialysis i during pregnancy2014  . Thrombocytopenia ([place]) 2014   History reviewed. No pertinent family history. Past Surgical History:  Procedure Laterality Date  . AV FISTULA PLACEMENT Left   . AV FISTULA PLACEMENT Right 04/14/2015   Procedure: CREATION OF RIGHT RADIOCEPHALIC ARTERIOVENOUS (AV) FISTULA ;  Surgeon: Angelia Mould, MD;  Location: [place];  Service: Vascular;  Laterality: Right;  . AV FISTULA PLACEMENT Right 11/22/2016   Procedure: ARTERIOVENOUS (AV) FISTULA CREATION;  Surgeon: [name], MD;  Location: [place];  Service: Vascular;  Laterality: Right;  . CESAREAN SECTION     2009  .  ESOPHAGOGASTRODUODENOSCOPY N/A 11/24/2016   Procedure: ESOPHAGOGASTRODUODENOSCOPY (EGD);  Surgeon: [name], MD;  Location: [facility] ENDOSCOPY;  Service: Endoscopy;  Laterality: N/A;  . EXCHANGE OF A DIALYSIS CATHETER Right 04/14/2015   Procedure: EXCHANGE OF RIGHT INTERNAL JUGULAR DIALYSIS CATHETER;  Surgeon: Angelia Mould, MD;  Location: [place];  Service: Vascular;  Laterality: Right;  . INSERTION OF DIALYSIS CATHETER Left 11/22/2016   Procedure: INSERTION OF DIALYSIS CATHETER - LEFT INTERNAL JUGULAR PLACEMENT;  Surgeon: [name], MD;  Location: [place];  Service: Vascular;  Laterality: Left;  . IR DIALY SHUNT INTRO [place] W/IMG RIGHT Right 07/26/2017  . IR DIALY SHUNT INTRO NEEDLE/INTRACATH INITIAL W/IMG RIGHT Right 10/18/2017  . IR REMOVAL TUN CV CATH W/O FL  03/06/2017  . LIGATION OF ARTERIOVENOUS  FISTULA Left 11/05/2014   Procedure: LIGATION OF LEFT ARM  BRACHIO-CEPHALIC ARTERIOVENOUS  FISTULA ,& REPAIR OF BRACHIAL ARTERY.;  Surgeon: Mal [name], MD;  Location: [place];  Service: Vascular;  Laterality: Left;  . THROMBECTOMY W/ EMBOLECTOMY Right 11/16/2016   Procedure: THROMBECTOMY REVISION OF ARTERIOVENOUS FISTULA - RIGHT ARM;  Surgeon: [name], MD;  Location: [place];  Service: Vascular;  Laterality: Right;  . TUBAL LIGATION  2014    Short Social History:  Social History   Tobacco Use  . Smoking status: Never Smoker  . Smokeless tobacco: Never Used  Substance Use Topics  . Alcohol  use: No    Alcohol/week: 0.0 oz    Allergies  Allergen Reactions  . Hydrocodone-Acetaminophen Itching  . No Known Allergies     Current Outpatient Medications  Medication Sig Dispense Refill  . amLODipine (NORVASC) 10 MG tablet Take 1 tablet (10 mg total) by mouth at bedtime. 30 tablet 3  . famotidine (PEPCID) 10 MG tablet Take 1 tablet (10 mg total) by mouth daily. (Patient not taking: Reported on 01/03/2018) 30 tablet 0  . multivitamin (RENA-VIT) TABS tablet  Take 1 tablet by mouth at bedtime. (Patient not taking: Reported on 01/03/2018) 30 tablet 0   No current facility-administered medications for this visit.     Review of Systems  Constitutional:  Constitutional negative. HENT: HENT negative.  Eyes: Eyes negative.  Respiratory: Positive for cough and shortness of breath.  GI: Positive for blood in stool.  Musculoskeletal: Musculoskeletal negative.  Skin: Skin negative.  Neurological: Neurological negative. Hematologic: Positive for bruises/bleeds easily.  Psychiatric: Positive for depressed mood.        Objective:  Objective   Vitals:   01/03/18 1526  BP: 139/89  Pulse: 98  Resp: 18  Temp: 98 F (36.7 C)  TempSrc: Oral  SpO2: 100%  Weight: 99 lb 9.6 oz (45.2 kg)  Height: 5' (1.524 m)   Body mass index is 19.45 kg/m.  Physical Exam  Constitutional: She appears well-developed.  HENT:  Head: Normocephalic.  Eyes: Pupils are equal, round, and reactive to light.  Neck:  Significant right sided swelling  Cardiovascular: Normal rate.  Pulses:      Radial pulses are 2+ on the right side, and 2+ on the left side.  Pulmonary/Chest: Effort normal.  Abdominal: Soft.  Musculoskeletal: Normal range of motion.  Rue swelling  Neurological: She is alert.  Skin: Skin is warm and dry.  Psychiatric: She has a normal mood and affect. Her behavior is normal. Judgment and thought content normal.    Data: CTA IMPRESSION No significant vascular finding in the neck itself. There appears to be some nonspecific edema within the soft tissues of the right supraclavicular region and upper chest. 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 This could be the cause of edema in that region, but there is no sign of acute thrombosis or other acute finding.  I reviewed her previous shuntogram which demonstrates innominate vein occlusion.      Assessment/Plan:     32 year old female history of end-stage renal disease on dialysis via right upper arm AV fistula.  She has an occluded right innominate vein which is contributing to swelling of her face and neck on the right which leads to shortness of breath with laying flat.  Her 2 options would be first ligate the fistula or second attempt recanalization of her innominate vein.  This is the option she would like.  We will attempt with the right arm fistulogram as well as central venogram from a femoral approach.  We will get this scheduled on a nondialysis day in the near future.     [name] MD [facility]

## 2018-01-08 ENCOUNTER — Ambulatory Visit (HOSPITAL_COMMUNITY)
Admission: RE | Admit: 2018-01-08 | Discharge: 2018-01-08 | Disposition: A | Discharge status: Home / Self Care | Payer: Self-pay | Source: Ambulatory Visit | Attending: Vascular Surgery | Admitting: Vascular Surgery

## 2018-01-08 ENCOUNTER — Encounter (HOSPITAL_COMMUNITY)
Admission: RE | Disposition: A | Discharge status: Home / Self Care | Payer: Self-pay | Source: Ambulatory Visit | Attending: Vascular Surgery

## 2018-01-08 ENCOUNTER — Encounter (HOSPITAL_COMMUNITY): Payer: Self-pay | Admitting: Vascular Surgery

## 2018-01-08 DIAGNOSIS — N186 End stage renal disease: Secondary | ICD-10-CM

## 2018-01-08 DIAGNOSIS — T82898A Other specified complication of vascular prosthetic devices, implants and grafts, initial encounter: Secondary | ICD-10-CM

## 2018-01-08 DIAGNOSIS — Z992 Dependence on renal dialysis: Secondary | ICD-10-CM | POA: Insufficient documentation

## 2018-01-08 DIAGNOSIS — I120 Hypertensive chronic kidney disease with stage 5 chronic kidney disease or end stage renal disease: Secondary | ICD-10-CM | POA: Insufficient documentation

## 2018-01-08 DIAGNOSIS — Y832 Surgical operation with anastomosis, bypass or graft as the cause of abnormal reaction of the patient, or of later complication, without mention of misadventure at the time of the procedure: Secondary | ICD-10-CM | POA: Insufficient documentation

## 2018-01-08 HISTORY — PX: A/V FISTULAGRAM: CATH118298

## 2018-01-08 HISTORY — PX: PERIPHERAL VASCULAR BALLOON ANGIOPLASTY: CATH118281

## 2018-01-08 LAB — POCT I-STAT, CHEM 8
BUN: 51 mg/dL — ABNORMAL HIGH (ref 6–20)
CALCIUM ION: 1.15 mmol/L (ref 1.15–1.40)
CHLORIDE: 99 mmol/L — AB (ref 101–111)
CREATININE: 10.3 mg/dL — AB (ref 0.44–1.00)
Glucose, Bld: 89 mg/dL (ref 65–99)
HCT: 41 % (ref 36.0–46.0)
Hemoglobin: 13.9 g/dL (ref 12.0–15.0)
Potassium: 4.7 mmol/L (ref 3.5–5.1)
Sodium: 136 mmol/L (ref 135–145)
TCO2: 25 mmol/L (ref 22–32)

## 2018-01-08 LAB — POCT ACTIVATED CLOTTING TIME: Activated Clotting Time: 153 seconds

## 2018-01-08 SURGERY — A/V FISTULAGRAM
Anesthesia: LOCAL

## 2018-01-08 MED ORDER — HEPARIN (PORCINE) IN NACL 2-0.9 UNIT/ML-% IJ SOLN
INTRAMUSCULAR | Status: AC | PRN
  Administered 2018-01-08: 500 mL

## 2018-01-08 MED ORDER — HEPARIN SODIUM (PORCINE) 1000 UNIT/ML IJ SOLN
INTRAMUSCULAR | Status: AC
  Filled 2018-01-08: qty 1

## 2018-01-08 MED ORDER — SODIUM CHLORIDE 0.9% FLUSH: 3.0000 mL | INTRAVENOUS | Status: DC | PRN

## 2018-01-08 MED ORDER — SODIUM CHLORIDE 0.9% FLUSH: 3.0000 mL | Freq: Two times a day (BID) | INTRAVENOUS | Status: DC

## 2018-01-08 MED ORDER — MIDAZOLAM HCL 2 MG/2ML IJ SOLN
INTRAMUSCULAR | Status: DC | PRN
  Administered 2018-01-08 (×4): 1 mg via INTRAVENOUS

## 2018-01-08 MED ORDER — HEPARIN (PORCINE) IN NACL 2-0.9 UNIT/ML-% IJ SOLN
INTRAMUSCULAR | Status: AC
  Filled 2018-01-08: qty 500

## 2018-01-08 MED ORDER — FENTANYL CITRATE (PF) 100 MCG/2ML IJ SOLN
INTRAMUSCULAR | Status: AC
  Filled 2018-01-08: qty 2

## 2018-01-08 MED ORDER — IODIXANOL 320 MG/ML IV SOLN
INTRAVENOUS | Status: DC | PRN
  Administered 2018-01-08: 65 mL via INTRAVENOUS

## 2018-01-08 MED ORDER — MIDAZOLAM HCL 2 MG/2ML IJ SOLN
INTRAMUSCULAR | Status: AC
  Filled 2018-01-08: qty 2

## 2018-01-08 MED ORDER — OXYCODONE-ACETAMINOPHEN 5-325 MG PO TABS
1.0000 | ORAL_TABLET | Freq: Once | ORAL | Status: AC
  Administered 2018-01-08: 1 via ORAL

## 2018-01-08 MED ORDER — SODIUM CHLORIDE 0.9 % IV SOLN: 250.0000 mL | INTRAVENOUS | Status: DC | PRN

## 2018-01-08 MED ORDER — CLOPIDOGREL BISULFATE 300 MG PO TABS
300.0000 mg | ORAL_TABLET | Freq: Once | ORAL | Status: AC
  Administered 2018-01-08: 300 mg via ORAL

## 2018-01-08 MED ORDER — CLOPIDOGREL BISULFATE 75 MG PO TABS: 75.0000 mg | ORAL_TABLET | Freq: Every day | ORAL | 3 refills | Status: DC

## 2018-01-08 MED ORDER — LIDOCAINE HCL (PF) 1 % IJ SOLN
INTRAMUSCULAR | Status: DC | PRN
  Administered 2018-01-08: 15 mL

## 2018-01-08 MED ORDER — HEPARIN SODIUM (PORCINE) 1000 UNIT/ML IJ SOLN
INTRAMUSCULAR | Status: DC | PRN
  Administered 2018-01-08: 3000 [IU] via INTRAVENOUS

## 2018-01-08 MED ORDER — CLOPIDOGREL BISULFATE 300 MG PO TABS
ORAL_TABLET | ORAL | Status: AC
  Filled 2018-01-08: qty 1

## 2018-01-08 MED ORDER — OXYCODONE-ACETAMINOPHEN 5-325 MG PO TABS
ORAL_TABLET | ORAL | Status: AC
  Filled 2018-01-08: qty 1

## 2018-01-08 MED ORDER — FENTANYL CITRATE (PF) 100 MCG/2ML IJ SOLN
INTRAMUSCULAR | Status: DC | PRN
  Administered 2018-01-08: 50 ug via INTRAVENOUS
  Administered 2018-01-08: 25 ug via INTRAVENOUS
  Administered 2018-01-08: 50 ug via INTRAVENOUS
  Administered 2018-01-08: 25 ug via INTRAVENOUS
  Administered 2018-01-08: 50 ug via INTRAVENOUS

## 2018-01-08 SURGICAL SUPPLY — 26 items
BAG SNAP BAND KOVER 36X36 (MISCELLANEOUS) ×3 IMPLANT
BALLN LUTONIX AV 12X40X75 (BALLOONS) ×3
BALLN MUSTANG 12.0X40 75 (BALLOONS) ×3
BALLN MUSTANG 8.0X40 75 (BALLOONS) ×3
BALLOON LUTONIX AV 12X40X75 (BALLOONS) ×2 IMPLANT
BALLOON MUSTANG 12.0X40 75 (BALLOONS) ×2 IMPLANT
BALLOON MUSTANG 8.0X40 75 (BALLOONS) ×2 IMPLANT
CATH ANGIO 5F BER2 100CM (CATHETERS) ×3 IMPLANT
CATH ANGIO 5F BER2 65CM (CATHETERS) ×3 IMPLANT
CATH QUICKCROSS SUPP .035X90CM (MICROCATHETER) ×3 IMPLANT
COVER DOME SNAP 22 D (MISCELLANEOUS) ×3 IMPLANT
COVER PRB 48X5XTLSCP FOLD TPE (BAG) ×2 IMPLANT
COVER PROBE 5X48 (BAG) ×1
DEVICE ONE SNARE 10MM (MISCELLANEOUS) ×3 IMPLANT
GUIDEWIRE LT ZIPWIRE 035X260 (WIRE) ×3 IMPLANT
KIT ENCORE 26 ADVANTAGE (KITS) ×3 IMPLANT
KIT MICROPUNCTURE NIT STIFF (SHEATH) ×3 IMPLANT
PROTECTION STATION PRESSURIZED (MISCELLANEOUS) ×3
SHEATH AVANTI 11CM 5FR (SHEATH) ×6 IMPLANT
SHEATH AVANTI 11CM 7FR (SHEATH) ×3 IMPLANT
STATION PROTECTION PRESSURIZED (MISCELLANEOUS) ×2 IMPLANT
STOPCOCK MORSE 400PSI 3WAY (MISCELLANEOUS) ×6 IMPLANT
SYR MEDRAD MARK V 150ML (SYRINGE) IMPLANT
TRAY PV CATH (CUSTOM PROCEDURE TRAY) ×3 IMPLANT
TUBING CIL FLEX 10 FLL-RA (TUBING) ×3 IMPLANT
WIRE TORQFLEX AUST .018X40CM (WIRE) ×3 IMPLANT

## 2018-01-08 NOTE — Progress Notes (Addendum)
Pt in cath holding area post procedure. Reports pain in both legs,  fistula site, chest pain and difficulty breathing. 100% saturated on room air, awake and conversing calmly via interpreter with non labored breathing. Dr Donzetta Matters has been paged. . Dr Donzetta Matters in. VO given for percocet and okayed for short stay.

## 2018-01-08 NOTE — H&P (Signed)
   History and Physical Update  The patient was interviewed and re-examined.  The patient's previous History and Physical has been reviewed and is unchanged from recent office visit. Plan for fistulogram of right arm and possible approach to svc occlusion from femoral vein.  Christopherjohn Schiele C. Donzetta Matters, MD [facility] [place] Office: [phone] Pager: [phone]   01/08/2018, 7:24 AM

## 2018-01-08 NOTE — Op Note (Signed)
Patient name: Karla Garner MRN: [id number] DOB: Sep 29, 1986 Sex: female  01/08/2018 Pre-operative Diagnosis: esrd, occluded right innominate vein Post-operative diagnosis:  Same Surgeon:  Eda Paschal. Donzetta Matters, MD Procedure Performed: 1.  US guided cannulation of right arm av fistula  2.  Right arm shuntogram 3.  US guided cannulation of right common femoral vein 4.  Central venogram 5.  Drug coated balloon angioplasty of right innominate vein with 12 x 61mm lutonix 6.  Moderate sedation with fentanyl and versed for 69 minutes  Indications: 32 year old female has history of end-stage renal disease.  She has swelling of her face and neck particularly with laying flat.  She is a known right innominate vein occlusion and is indicated for venography with possible  intervention.  Findings: The right arm cephalic vein fistula is patent without any stenosis.  The right innominate vein is occluded for a very short segment.  The left innominate vein is patent as is the SVC.  At completion there is a wide open channel through the innominate vein on the right with minimal collateralization and flow directly from the right arm fistula into the right heart.   Procedure:  The patient was identified in the holding area and taken to room 8.  The patient was then placed supine on the table and prepped and draped in the usual sterile fashion.  A time out was called.  Ultrasound was used to evaluate the right arm AV fistula this was cannulated with micropuncture needle followed by wire and sheath.  We then performed right upper extremity shuntogram with the above findings.  We exchanged for then a short 5 French sheath and 3000 units of heparin were given.  We attempted to cross the occluded innominate vein with Glidewire and bare catheter but were unable to do this.  We then used ultrasound guidance to cannulate the right common femoral vein with micropuncture needle followed by wire and sheath and then placed  a short 5 French sheath.  We then used the back of a Glidewire bare catheter in multiple fluoroscopic views to cross from below through the occluded innominate vein.  We then able to get a quick cross catheter to pass into the right subclavian vein and we confirmed intraluminal access fluoroscopically.  From the right arm approach we were then able to snare our wire and pulled through and through access.  We then exchanged for a short 7 French sheath in the right groin.  We then dilated the innominate vein first with 8 mm balloon followed by 12 mm balloon which did cause significant pressure in the patient's chest.  Fistulogram at this time demonstrated improved flow centrally with only minimal collateralization.  With that we elected to use a drug-coated balloon that was 12 mm x 4 cm and this was inflated for nearly 2 minutes at nominal pressure.  There is some tiny residual wasting where it was once occluded.  After this was taken down we then completed fistulogram which demonstrated a brisk flow with very minimal residual stenosis and very minimal collateralization suggesting a wide channel through our innominate vein.  Satisfied with this the wire was removed through the quick cross catheter and the catheter was removed.  A 4-0 Monocryl was used to suture close our access site fistula and the sheath in the groin was pulled and pressure held for 10 minutes.  Patient tolerated procedure well without immediate complication.  Contrast: 65cc  Jeniyah Menor C. Donzetta Matters, MD [facility]  Office: [phone] Pager: [phone]

## 2018-01-08 NOTE — Discharge Instructions (Signed)
Cuidado del sitio femoral (Femoral Site Care) Siga estas instrucciones durante las prximas semanas. Estas indicaciones le proporcionan informacin acerca de cmo deber cuidarse despus del procedimiento. El mdico tambin podr darle instrucciones ms especficas. El tratamiento ha sido planificado segn las prcticas mdicas actuales, pero en algunos casos pueden ocurrir problemas. Comunquese con el mdico si tiene algn problema o tiene dudas despus del procedimiento. QU ESPERAR DESPUS DEL PROCEDIMIENTO Despus del procedimiento, es normal tener lo siguiente:  Hematomas en el sitio que suelen desaparecer en el trmino de 1 o 2semanas.  Acumulacin de sangre en el tejido (hematoma) que puede ser dolorosa al tacto. Generalmente, en 1 o 2 semanas deben disminuir su tamao y el dolor a la palpacin. [place] los medicamentos solamente como se lo haya indicado el mdico.  Puede ducharse 24a 48horas despus del procedimiento o como se lo haya indicado el mdico. Retire el vendaje (apsito) y limpie suavemente el lugar de la insercin con agua y jabn comn. Seque bien el rea con una toalla limpia dando golpecitos. No frote el lugar, ya que Therapist, art.  No tome baos de inmersin, no nade ni use el jacuzzi hasta que el mdico lo autorice.  Revise diariamente el lugar de la insercin para ver si aparece enrojecimiento, hinchazn o secrecin.  No se aplique talcos ni lociones en el lugar.  Limite el uso de las escaleras a dos veces por da [employer] primeros 2 o 3das o como se lo haya indicado el mdico.  No se ponga en cuclillas durante los primeros 2 o 3das o como se lo haya indicado el mdico.  No levante objetos que pesen ms de 10libras (4,5kg) durante los 5das posteriores al procedimiento o como se lo haya indicado el mdico.  Pregntele al mdico cundo puede hacer lo siguiente: ? Regresar a la escuela o al [employer]. ? Reanudar  las actividades fsicas o los deportes que practica habitualmente. ? Reanudar la actividad sexual.  No conduzca el automvil por sus propios medios si le dan el alta el mismo da del procedimiento. [place] a otra persona que lo lleve.  Puede conducir 24horas despus del procedimiento, a menos que el mdico le haya indicado lo contrario.  No opere maquinaria ni herramientas elctricas durante 24horas despus del procedimiento o como lo haya indicado el mdico.  Si el procedimiento se hizo de IT trainer, lo que significa que regres a su casa el mismo da de su realizacin, un adulto responsable debe acompaarlo durante las primeras 24horas.  Concurra a todas las visitas de control como se lo haya indicado el mdico. Esto es importante. SOLICITE ATENCIN MDICA SI:  Karla Garner.  Tiene escalofros.  Aumenta el sangrado en el sitio. Haga presin Mudlogger. SOLICITE ATENCIN MDICA DE INMEDIATO SI:  Tiene un dolor que no es habitual en el sitio.  Observa que el sitio est enrojecida, caliente o hinchada.  Tiene secrecin (que no es una pequea cantidad de sangre en el vendaje) en el sitio.  El sitio est sangrando, y el sangrado no se detiene despus de 32minutos de Oceanographer una presin constante.  La pierna o el pie se le ponen plidos, fros o siente hormigueo o adormecimiento. Esta informacin no tiene Marine scientist el consejo del mdico. Asegrese de hacerle al mdico cualquier pregunta que tenga. Document Released: 07/23/2014 Document Revised: 01/30/2016 Document Reviewed: 04/27/2014 Elsevier Interactive Patient Education  2018 [place]. Femoral Site Care Refer to this sheet in  the next few weeks. These instructions provide you with information about caring for yourself after your procedure. Your health care provider may also give you more specific instructions. Your treatment has been planned according to current medical practices, but problems sometimes  occur. Call your health care provider if you have any problems or questions after your procedure. What can I expect after the procedure? After your procedure, it is typical to have the following:  Bruising at the site that usually fades within 1-2 weeks.  Blood collecting in the tissue (hematoma) that may be painful to the touch. It should usually decrease in size and tenderness within 1-2 weeks.  Follow these instructions at home:  Take medicines only as directed by your health care provider.  You may shower 24-48 hours after the procedure or as directed by your health care provider. Remove the bandage (dressing) and gently wash the site with plain soap and water. Pat the area dry with a clean towel. Do not rub the site, because this may cause bleeding.  Do not take baths, swim, or use a hot tub until your health care provider approves.  Check your insertion site every day for redness, swelling, or drainage.  Do not apply powder or lotion to the site.  Limit use of stairs to twice a day for the first 2-3 days or as directed by your health care provider.  Do not squat for the first 2-3 days or as directed by your health care provider.  Do not lift over 10 lb (4.5 kg) for 5 days after your procedure or as directed by your health care provider.  Ask your health care provider when it is okay to: ? Return to work or school. ? Resume usual physical activities or sports. ? Resume sexual activity.  Do not drive home if you are discharged the same day as the procedure. Have someone else drive you.  You may drive 24 hours after the procedure unless otherwise instructed by your health care provider.  Do not operate machinery or power tools for 24 hours after the procedure or as directed by your health care provider.  If your procedure was done as an outpatient procedure, which means that you went home the same day as your procedure, a responsible adult should be with you for the first 24  hours after you arrive home.  Keep all follow-up visits as directed by your health care provider. This is important. Contact a health care provider if:  You have a fever.  You have chills.  You have increased bleeding from the site. Hold pressure on the site. Get help right away if:  You have unusual pain at the site.  You have redness, warmth, or swelling at the site.  You have drainage (other than a small amount of blood on the dressing) from the site.  The site is bleeding, and the bleeding does not stop after 30 minutes of holding steady pressure on the site.  Your leg or foot becomes pale, cool, tingly, or numb. This information is not intended to replace advice given to you by your health care provider. Make sure you discuss any questions you have with your health care provider. Document Released: 06/11/2014 Document Revised: 03/15/2016 Document Reviewed: 04/27/2014 Elsevier Interactive Patient Education  [employer].

## 2018-02-25 ENCOUNTER — Encounter: Payer: Self-pay | Admitting: Physician Assistant

## 2018-03-06 ENCOUNTER — Other Ambulatory Visit (HOSPITAL_COMMUNITY): Payer: Self-pay | Admitting: Nephrology

## 2018-03-06 DIAGNOSIS — N186 End stage renal disease: Secondary | ICD-10-CM

## 2018-03-07 ENCOUNTER — Ambulatory Visit (HOSPITAL_COMMUNITY): Payer: Self-pay

## 2018-03-10 ENCOUNTER — Encounter (HOSPITAL_COMMUNITY): Payer: Self-pay | Admitting: Interventional Radiology

## 2018-03-10 ENCOUNTER — Ambulatory Visit (HOSPITAL_COMMUNITY)
Admission: RE | Admit: 2018-03-10 | Discharge: 2018-03-10 | Disposition: A | Discharge status: Home / Self Care | Payer: Self-pay | Source: Ambulatory Visit | Attending: Nephrology | Admitting: Nephrology

## 2018-03-10 ENCOUNTER — Encounter: Payer: Self-pay | Admitting: Nurse Practitioner

## 2018-03-10 DIAGNOSIS — N186 End stage renal disease: Secondary | ICD-10-CM

## 2018-03-10 DIAGNOSIS — M542 Cervicalgia: Secondary | ICD-10-CM | POA: Insufficient documentation

## 2018-03-10 HISTORY — PX: IR DIALY SHUNT INTRO NEEDLE/INTRACATH INITIAL W/IMG RIGHT: IMG6115

## 2018-03-10 MED ORDER — IOPAMIDOL (ISOVUE-300) INJECTION 61%
INTRAVENOUS | Status: AC
  Administered 2018-03-10: 38 mL
  Filled 2018-03-10: qty 100

## 2018-03-13 ENCOUNTER — Ambulatory Visit: Payer: Self-pay | Admitting: Nurse Practitioner

## 2018-03-18 ENCOUNTER — Other Ambulatory Visit: Payer: Self-pay | Admitting: Nephrology

## 2018-03-18 DIAGNOSIS — R221 Localized swelling, mass and lump, neck: Secondary | ICD-10-CM

## 2018-03-18 DIAGNOSIS — I871 Compression of vein: Secondary | ICD-10-CM

## 2018-03-19 ENCOUNTER — Ambulatory Visit (INDEPENDENT_AMBULATORY_CARE_PROVIDER_SITE_OTHER): Payer: Self-pay | Admitting: Nurse Practitioner

## 2018-03-19 ENCOUNTER — Encounter: Payer: Self-pay | Admitting: Nurse Practitioner

## 2018-03-19 VITALS — BP 120/70 | HR 84 | Ht 58.27 in | Wt 96.4 lb

## 2018-03-19 DIAGNOSIS — R1310 Dysphagia, unspecified: Secondary | ICD-10-CM

## 2018-03-19 NOTE — Progress Notes (Signed)
Chief Complaint: swallowing problems  Referring Provider:  self    ASSESSMENT AND PLAN;   69.  32 year old non-English-speaking female with 58-monthhistory of upper GI symptoms including solid food dysphagia, heartburn and epigastric burning (mainly postprandial).  Similar symptoms February 2018 with CT scan showing edema and inflammatory change in the head and the neck of the pancreas, first and second parts of duodenum.  Subsequent upper endoscopy with biopsies revealed gastritis and duodenitis.  No H. pylori on biopsies -Epigastric burning and GERD symptoms improving on unknown medication started 10 days ago by Renal.  Patient will call with the name of this medication so we can have it for our records -Solid food dysphagia persists.  Requires fluid to get food to move down the esophagus.  She has not had any major weight loss.  EGD for similar symptoms a year and a half ago showed no esophageal abnormalities so I feel like repeat EGD would be low yield.  Will obtain a barium swallow with tablet to look for motility problems.  -If barium swallow negative will consider treating empirically for Candida esophagitis.  Patient has mentioned a white coating on her tongue from time to time.  There is definitely no obvious oral Candida on exam today   2.  ESRD on hemodialysis.  -We will call u[place]ore[place] the name of medication being taken for heartburn that we will have this for her record  3.  Constipation , significantly improved after one of her providers prescribed a medication for constipation. -I asked the patient call u[place]ack with the name of this medication for our records   HPI:     Patient is a 32year old Spanish speaking female here for evaluation of dysphagia. History obtained through use of an interpreter. 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  We met her in the hospital February 2018 for evaluation of nausea, vomiting, and dysphagia.  Noncontrast  CT scan of the abdomen and pelvis revealed edema/inflammatory change in the head/neck of the pancreas and D1 and D2.  Abd ultrasound unremarkable with tiny amount of gallbladder sludge, HIDA scan was normal.  She underwent inpatient EGD with findings of mild gastritis and mild duodenitis.  Biopsy showed active gastritis, no H. Pylori.  Several months ago patient developed problems with right-sided facial swelling.  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  For continued swelling and difficulty breathing when lying flat she had a balloon angioplasty of right innominate vein late March.  She had recurrent facial swelling and a right supraclavicular "knot" so another study of shunt done several days ago and it was normal. During all of this she developed some upper GI issues.   [name] a 218-monthistory of postprandial heartburn / epigastric burning.  Upper abdomen feels irritated at times, especially postprandially. Burning pain doesn't radiate through to back. . She has associated nausea without vomiting. No NSAID use.    GERD/ Dysphagia Over the last 2 months she has also developed problems with solid food sticking in her esophagus.   She drinks fluids to push the food down and this causes pain in her esophagus.  Fluids alone go down fine and without pain.  She describes a white coating on her tongue which comes and goes. No recent antibiotics or steroids.  Nephrologist started her on med for heartburn about 10 days ago which has helped the epigastric pain and heartburn but  she doesn't know name of med.  She continues to have problems with solid food dysphagia and nausea.  Her appetite is not good but weight is stable  Patient also gives a history of constipation.  Someone prescribed a medication for constipation which she is taking with improvement.  She doesn't know name of med  No blood in her stool.  No other GI complaints.   Past Medical History:  Diagnosis Date  .  Anemia   . Depression   . ESRD (end stage renal disease) ([place])    from IgA nephritis  . Glomerulonephritis   . Hemodialysis patient ([place])    Tu, Th, Sat  . Hypertension   . PONV (postoperative nausea and vomiting)    was on dialysis i during pregnancy2014  . Sleep apnea   . Thrombocytopenia ([place]) 2014     Past Surgical History:  Procedure Laterality Date  . A/V FISTULAGRAM N/A 01/08/2018   Procedure: A/V FISTULAGRAM - right arm;  Surgeon: [name], MD;  Location: [facility];  Service: Cardiovascular;  Laterality: N/A;  . AV FISTULA PLACEMENT Left   . AV FISTULA PLACEMENT Right 04/14/2015   Procedure: CREATION OF RIGHT RADIOCEPHALIC ARTERIOVENOUS (AV) FISTULA ;  Surgeon: Angelia Mould, MD;  Location: [place];  Service: Vascular;  Laterality: Right;  . AV FISTULA PLACEMENT Right 11/22/2016   Procedure: ARTERIOVENOUS (AV) FISTULA CREATION;  Surgeon: [name], MD;  Location: [place];  Service: Vascular;  Laterality: Right;  . CESAREAN SECTION     2009  . ESOPHAGOGASTRODUODENOSCOPY N/A 11/24/2016   Procedure: ESOPHAGOGASTRODUODENOSCOPY (EGD);  Surgeon: [name], MD;  Location: [facility];  Service: Endoscopy;  Laterality: N/A;  . EXCHANGE OF A DIALYSIS CATHETER Right 04/14/2015   Procedure: EXCHANGE OF RIGHT INTERNAL JUGULAR DIALYSIS CATHETER;  Surgeon: Angelia Mould, MD;  Location: [place];  Service: Vascular;  Laterality: Right;  . INSERTION OF DIALYSIS CATHETER Left 11/22/2016   Procedure: INSERTION OF DIALYSIS CATHETER - LEFT INTERNAL JUGULAR PLACEMENT;  Surgeon: [name], MD;  Location: [place];  Service: Vascular;  Laterality: Left;  . IR DIALY SHUNT INTRO [place] W/IMG RIGHT Right 07/26/2017  . IR DIALY SHUNT INTRO NEEDLE/INTRACATH INITIAL W/IMG RIGHT Right 10/18/2017  . IR DIALY SHUNT INTRO NEEDLE/INTRACATH INITIAL W/IMG RIGHT Right 03/10/2018  . IR REMOVAL TUN CV CATH W/O FL  03/06/2017  . LIGATION  OF ARTERIOVENOUS  FISTULA Left 11/05/2014   Procedure: LIGATION OF LEFT ARM  BRACHIO-CEPHALIC ARTERIOVENOUS  FISTULA ,& REPAIR OF BRACHIAL ARTERY.;  Surgeon: Mal [name], MD;  Location: [place];  Service: Vascular;  Laterality: Left;  . PERIPHERAL VASCULAR BALLOON ANGIOPLASTY  01/08/2018   Procedure: PERIPHERAL VASCULAR BALLOON ANGIOPLASTY;  Surgeon: [name], MD;  Location: [place] [facility];  Service: Cardiovascular;;  innominate vein  . THROMBECTOMY W/ EMBOLECTOMY Right 11/16/2016   Procedure: THROMBECTOMY REVISION OF ARTERIOVENOUS FISTULA - RIGHT ARM;  Surgeon: [name], MD;  Location: [place];  Service: Vascular;  Laterality: Right;  . TUBAL LIGATION  2014   Family History  Problem Relation Age of Onset  . Other Mother        no medical problems per patient  . Other Father        no medical problems per patient  . Colon cancer Neg Hx   . Liver cancer Neg Hx   . Stomach cancer Neg Hx    Social History   Tobacco Use  . Smoking  status: Never Smoker  . Smokeless tobacco: Never Used  Substance Use Topics  . Alcohol use: No    Alcohol/week: 0.0 oz  . Drug use: No   Current Outpatient Medications  Medication Sig Dispense Refill  . acetaminophen (TYLENOL) 500 MG tablet Take 1,000 mg by mouth every 6 (six) hours as needed for mild pain.    Marland Kitchen amLODipine (NORVASC) 10 MG tablet Take 1 tablet (10 mg total) by mouth at bedtime. 30 tablet 3   No current facility-administered medications for this visit.    Allergies  Allergen Reactions  . No Known Allergies     Review of Systems: Multiple positives:  Positive for generalized joint "popping", chills when something touches her skin.  Also positive for vision changes, fatigue, menstrual pain, muscle pain and cramps, night sweats, shortness of breath, skin rash, sleeping problems, sore throat, swollen lymph glands, excessive thirst, excessive pain urination.  All systems reviewed and negative except where noted in HPI.     Physical Exam:    Wt Readings from Last 3 Encounters:  03/19/18 96 lb 6 oz (43.7 kg)  01/08/18 97 lb (44 kg)  01/03/18 99 lb 9.6 oz (45.2 kg)    BP 120/70   Pulse 84   Ht 4' 10.27" (1.48 m)   Wt 96 lb 6 oz (43.7 kg)   BMI 19.96 kg/m  Constitutional:  Pleasant female in no acute distress. Psychiatric: Normal mood and affect. Behavior is normal. EENT: Pupils normal.  Conjunctivae are normal. No scleral icterus. Neck supple.  Cardiovascular: Normal rate, regular rhythm. No edema Pulmonary/chest: Effort normal and breath sounds normal. No wheezing, rales or rhonchi. Abdominal: Soft, nondistended, nontender. Bowel sounds active throughout. There are no masses palpable. No hepatomegaly. Neurological: Alert and oriented to person place and time. Skin: Skin is warm and dry. No rashes noted.  Tye Savoy, NP  03/19/2018, 3:34 PM

## 2018-03-19 NOTE — Patient Instructions (Signed)
If you are age 30 or older, your body mass index should be between 23-30. Your Body mass index is 19.96 kg/m. If this is out of the aforementioned range listed, please consider follow up with your Primary Care Provider.  If you are age 30 or younger, your body mass index should be between 19-25. Your Body mass index is 19.96 kg/m. If this is out of the aformentioned range listed, please consider follow up with your Primary Care Provider.   You have been scheduled for a Barium Esophogram at [facility] (1st floor of the hospital) on 03/31/18 at 11 am. Please arrive 15 minutes prior to your appointment for registration. Make certain not to have anything to eat or drink 3 hours prior to your test. If you need to reschedule for any reason, please contact radiology at [phone] to do so. __________________________________________________________________ A barium swallow is an examination that concentrates on views of the esophagus. This tends to be a double contrast exam (barium and two liquids which, when combined, create a gas to distend the wall of the oesophagus) or single contrast (non-ionic iodine based). The study is usually tailored to your symptoms so a good history is essential. Attention is paid during the study to the form, structure and configuration of the esophagus, looking for functional disorders (such as aspiration, dysphagia, achalasia, motility and reflux) EXAMINATION You may be asked to change into a gown, depending on the type of swallow being performed. A radiologist and radiographer will perform the procedure. The radiologist will advise you of the type of contrast selected for your procedure and direct you during the exam. You will be asked to stand, sit or lie in several different positions and to hold a small amount of fluid in your mouth before being asked to swallow while the imaging is performed .In some instances you may be asked to swallow barium coated marshmallows  to assess the motility of a solid food bolus. The exam can be recorded as a digital or video fluoroscopy procedure. POST PROCEDURE It will take 1-2 days for the barium to pass through your system. To facilitate this, it is important, unless otherwise directed, to increase your fluids for the next 24-48hrs and to resume your normal diet.  This test typically takes about 30 minutes to perform. _____________________________________________________________________________Will call  You with results.  Please call our office with names of the two medications you are taking and ask for [facility].  Thank you for choosing me and [facility].   Tye Savoy, NP

## 2018-03-20 ENCOUNTER — Encounter: Payer: Self-pay | Admitting: Nurse Practitioner

## 2018-03-20 NOTE — Progress Notes (Signed)
Reviewed and agree with initial management plan.  Icker Swigert T. Aerik Polan, MD FACG 

## 2018-03-31 ENCOUNTER — Ambulatory Visit (HOSPITAL_COMMUNITY)
Admission: RE | Admit: 2018-03-31 | Discharge: 2018-03-31 | Disposition: A | Discharge status: Home / Self Care | Payer: Self-pay | Source: Ambulatory Visit | Attending: Nurse Practitioner | Admitting: Nurse Practitioner

## 2018-03-31 DIAGNOSIS — R1310 Dysphagia, unspecified: Secondary | ICD-10-CM | POA: Insufficient documentation

## 2018-03-31 DIAGNOSIS — M7989 Other specified soft tissue disorders: Secondary | ICD-10-CM | POA: Insufficient documentation

## 2018-04-22 MED ORDER — FLUCONAZOLE 100 MG PO TABS: 100.0000 mg | ORAL_TABLET | Freq: Every day | ORAL | 0 refills | Status: AC

## 2018-04-28 ENCOUNTER — Ambulatory Visit: Payer: Self-pay | Admitting: Family Medicine

## 2018-05-05 ENCOUNTER — Encounter: Payer: Self-pay | Admitting: Family Medicine

## 2018-05-05 ENCOUNTER — Ambulatory Visit (INDEPENDENT_AMBULATORY_CARE_PROVIDER_SITE_OTHER): Payer: Self-pay | Admitting: Family Medicine

## 2018-05-05 ENCOUNTER — Other Ambulatory Visit: Payer: Self-pay

## 2018-05-05 VITALS — BP 130/80 | HR 104 | Temp 98.7°F | Ht 60.0 in | Wt 98.0 lb

## 2018-05-05 DIAGNOSIS — F411 Generalized anxiety disorder: Secondary | ICD-10-CM

## 2018-05-05 MED ORDER — FLUOXETINE HCL 10 MG PO CAPS: 10.0000 mg | ORAL_CAPSULE | Freq: Every day | ORAL | 3 refills | Status: DC

## 2018-05-05 NOTE — Progress Notes (Signed)
    Subjective:  Karla Garner is a 32 y.o. female who presents to the [facility] today for multiple complaints.  History taken via Spanish interpreter  HPI:  She states that she was told to come see her PCP by her nephrologist.  She says that she needs to be evaluated for PTSD.  She was complaining of cracking and popping in all of her joints was told that it may be related to her mood.  She says that she has all of her joint pain but only when she moves any of her joints throughout the range of motion she hears a cracking or popping sound.  This been ongoing for the past 5 months.  She has never had anything like this before. She also has pain when she pushes on her joints.  She does not have any morning stiffness.  She does not have any erythema or swelling of the joints.  She does not have any skin laxity or history of joint laxity including history of sprains or fractures.  Does not have any easy bruising.  Does not have any numbness or tingling. She also states that her nephrologist told her to be evaluated for a low sex hormone.  She states that she does not have much of a sexual drive.  When she has intercourse with her husband she does not enjoy it but rather finds it painful and does not have mid sensation.  She reports irregular periods..  She does not have any vaginal dryness but rather does have times where she has a little bit more vaginal discharge than at other times.  No itching or burning.  She recalls being in the hospital and getting "hormone injections" that made her feel better. Patient does say that these issues make her feel nervous. Does get regular blood work at dialysis and recalls that her calcium levels are good but that her phosphorus is currently high.  ROS: Per HPI  Objective:  Physical Exam: BP 130/80   Pulse (!) 104   Temp 98.7 F (37.1 C) (Oral)   Ht 5' (1.524 m)   Wt 98 lb (44.5 kg)   SpO2 90%   BMI 19.14 kg/m   Gen: NAD, resting comfortably CV: RRR  with no murmurs appreciated Pulm: NWOB, CTAB with no crackles, wheezes, or rhonchi GI: Normal bowel sounds present. Soft, Nontender, Nondistended. MSK: no edema, cyanosis, or clubbing noted Skin: warm, dry Neuro: grossly normal, moves all extremities Psych: Normal affect and thought content   Assessment/Plan:  1. Anxiety state Uncertain etiology of patient's unrelated concerns are only linked by recommendation from nephrology to come to PCP for evaluation for PTSD.  Patient did have a stressful life event recently where she had an occluded right innominate vein that required cannulation and angioplasty.  Regarding her various joint concerns as well as her decreased sexual libido/dyspareunia, possible that related to the patient's mood.  Per chart review she was evaluated by psychiatry in 2016 and put on Prozac which she tolerated well but does not recall if she found it effective.  Discussed trial of SSRI and follow-up in 1 month.  She may need a referral to OB/GYN as per chart review cannot find record of hormone injections. - FLUoxetine (PROZAC) 10 MG capsule; Take 1 capsule (10 mg total) by mouth daily.  Dispense: 30 capsule; Refill: [place], DO PGY-3, [facility] 05/05/2018 3:37 PM

## 2018-05-05 NOTE — Patient Instructions (Signed)
Start prozac 10mg  daily.  Follow up with me in 1 months to see how your symptoms are doing.

## 2018-05-20 IMAGING — US US ABDOMEN LIMITED
1 series · 14 of 25 positions shown · non-contrast
Comparison: None.

CLINICAL DATA: Epigastric pain for 2 days.  Thrombocytopenia

EXAM:
US ABDOMEN LIMITED - RIGHT UPPER QUADRANT

[Series 1: us abdomen limited · 0.23mm/px · 14 of 49 slices shown]
[im 1/49]
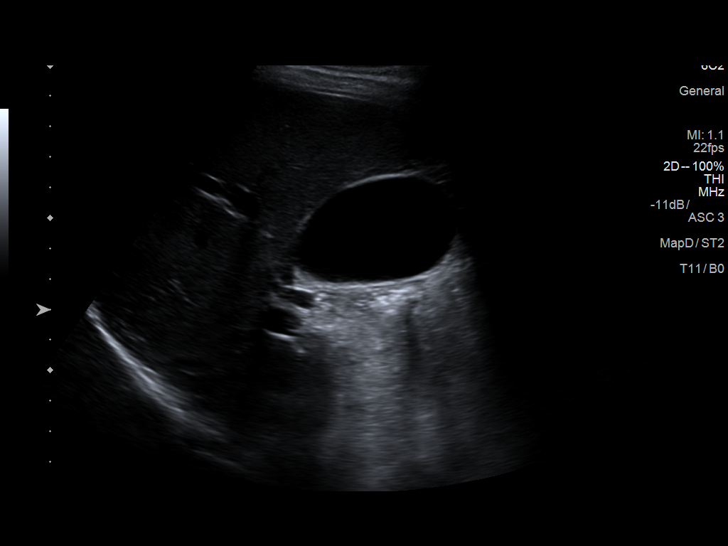
[im 5/49]
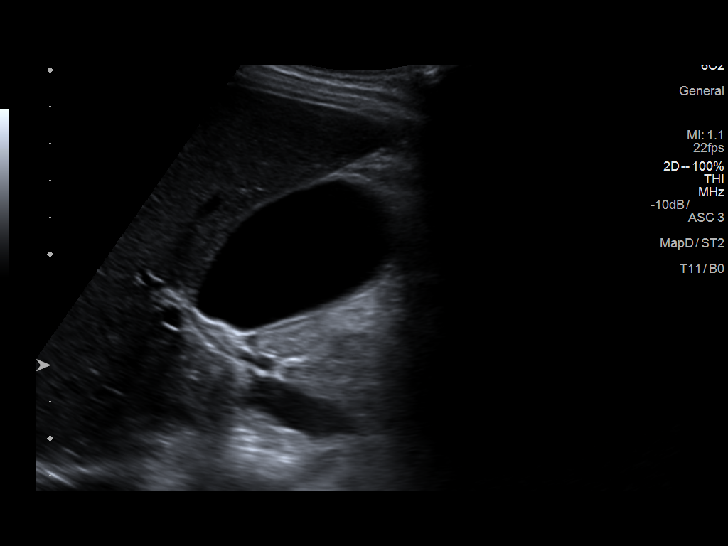
[im 9/49]
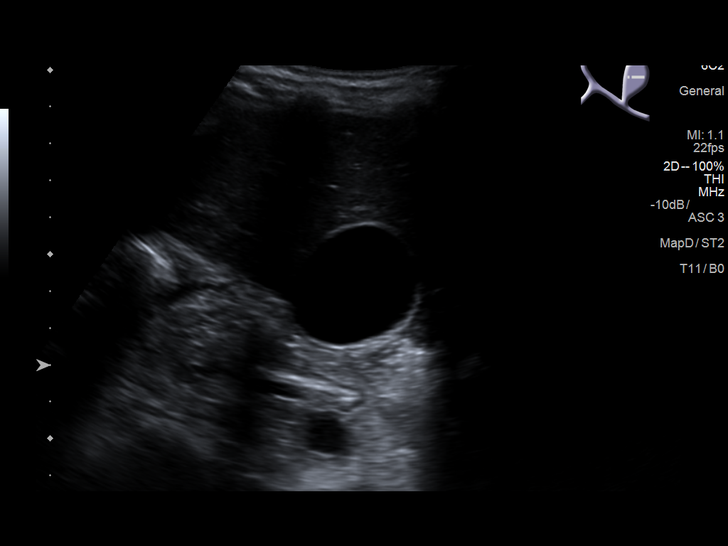
[im 13/49]
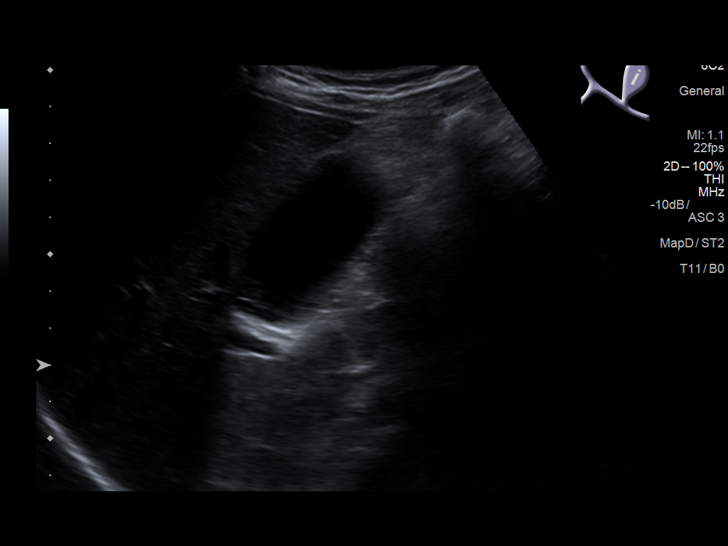
[im 17/49]
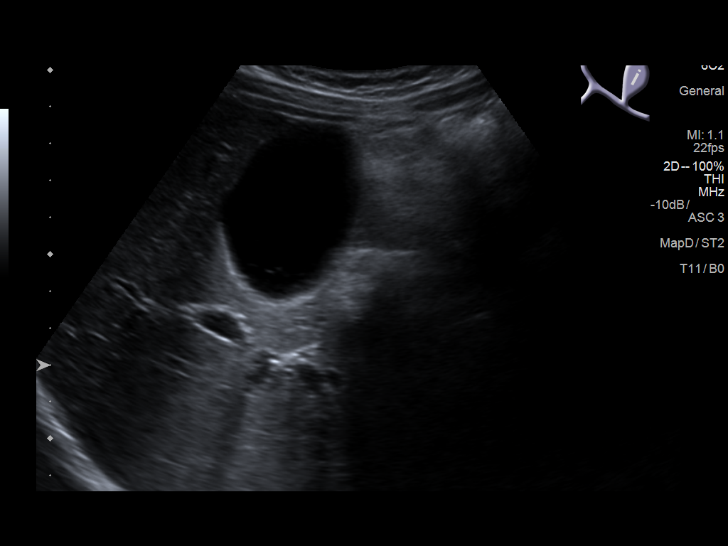
[im 19/49]
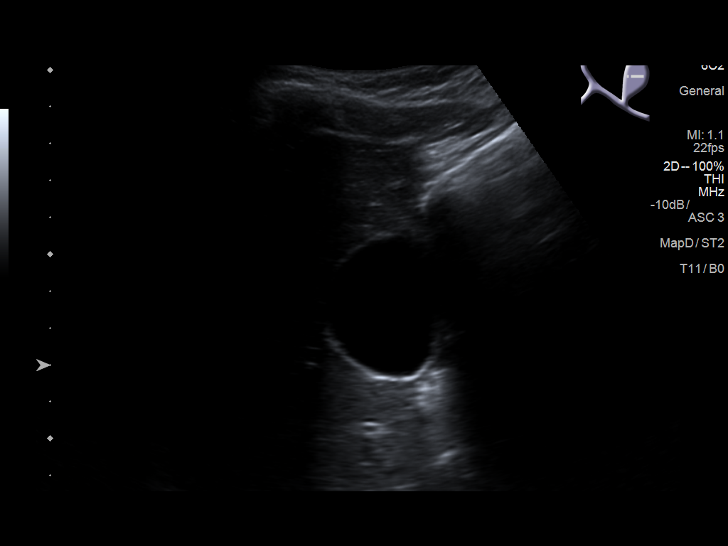
[im 23/49]
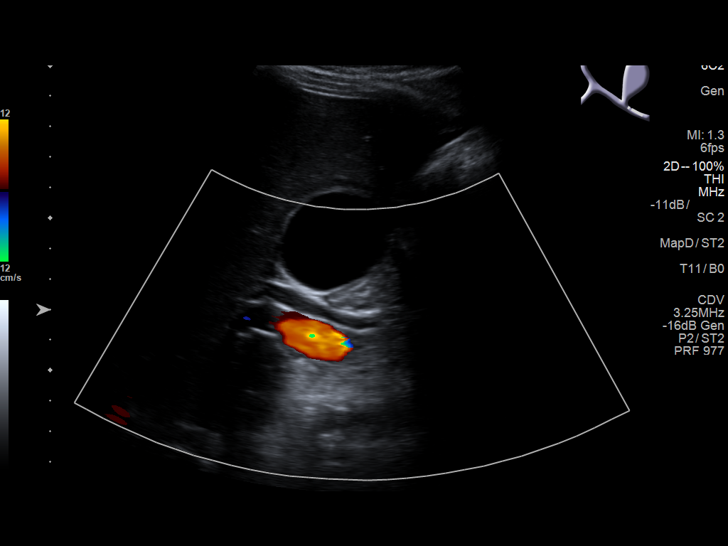
[im 27/49]
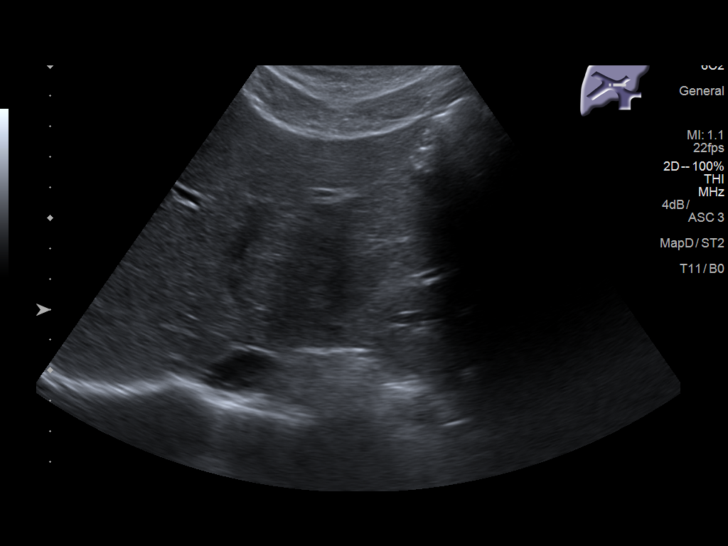
[im 31/49]
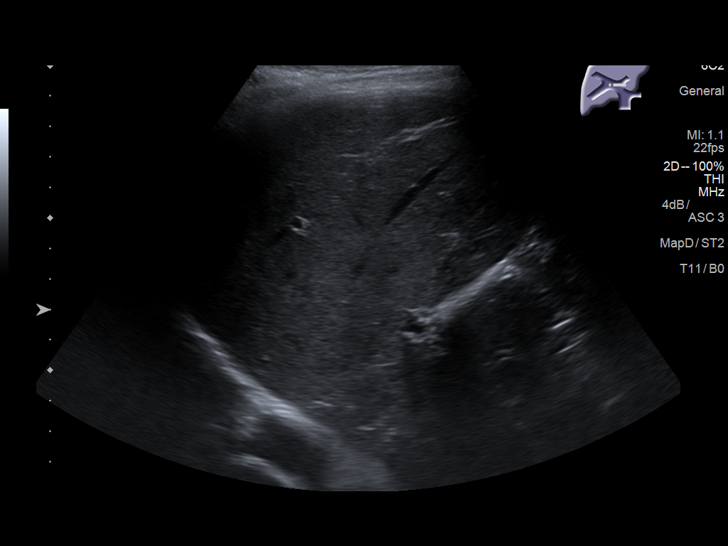
[im 33/49]
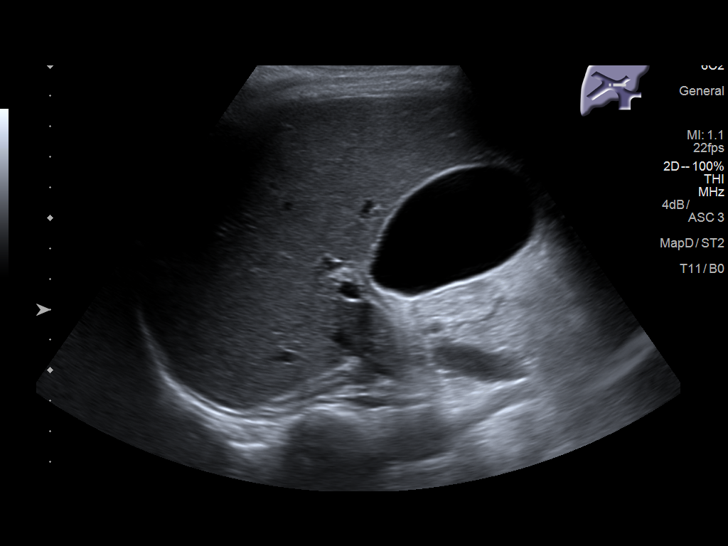
[im 37/49]
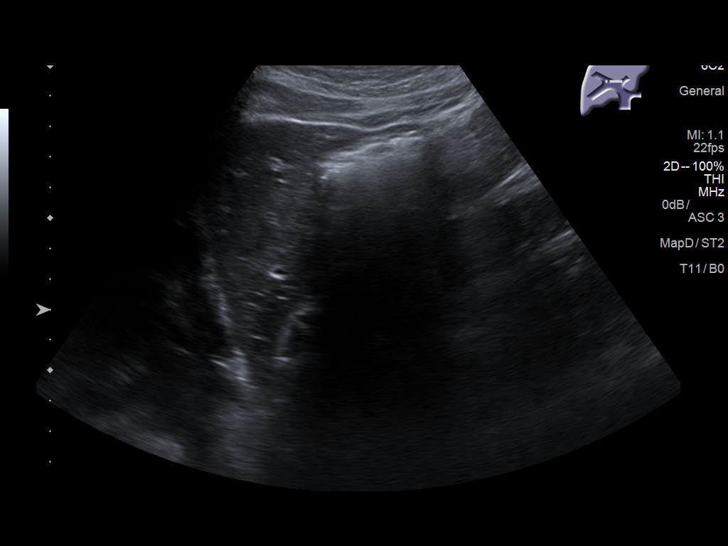
[im 41/49]
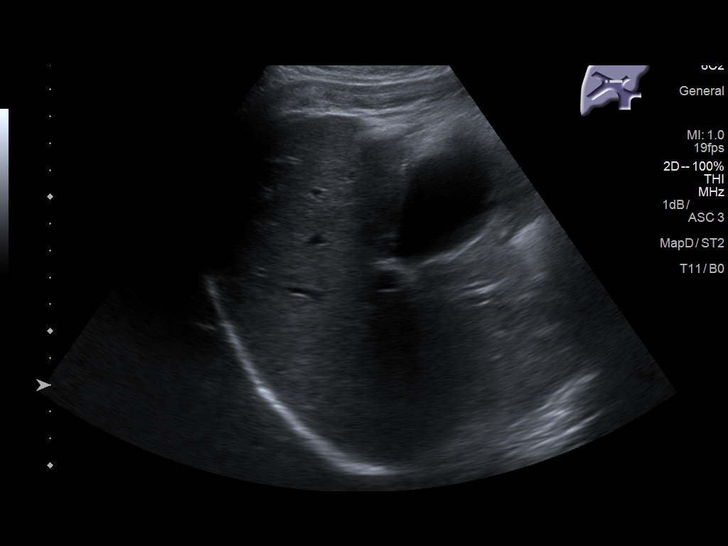
[im 45/49]
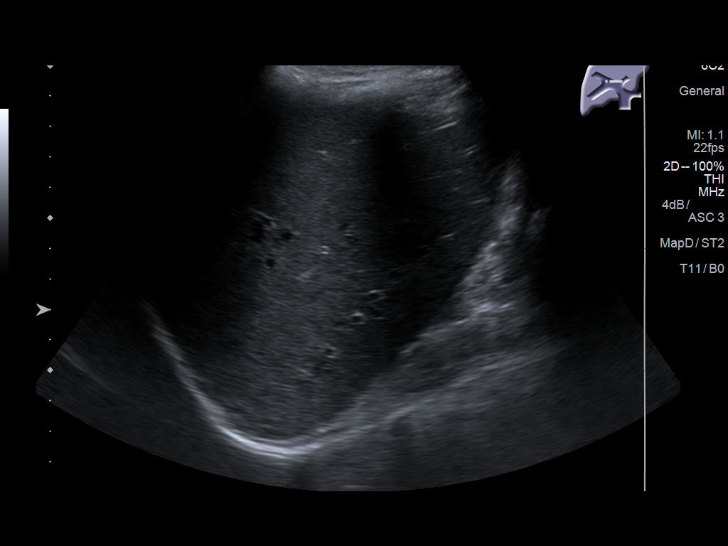
[im 49/49]
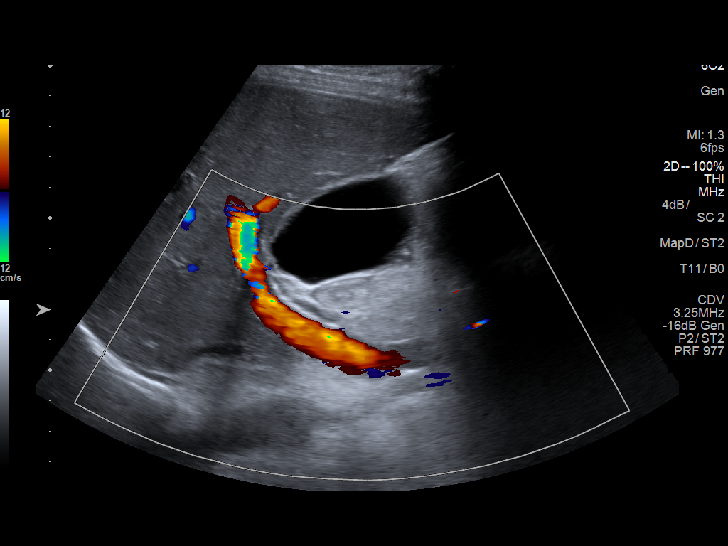

[14 of 25 positions shown; findings below may reference images not displayed]

FINDINGS: Gallbladder:

Tiny amount of mobile sludge is identified. No stones, wall
thickening or pericholecystic fluid. Sonographer reports negative
Murphy's sign.

Common bile duct:

Diameter: 0.6 cm.

Liver:

No focal lesion identified. Within normal limits in parenchymal
echogenicity.
IMPRESSION: Tiny amount of gallbladder sludge. The examination is otherwise
negative.

## 2018-06-05 ENCOUNTER — Ambulatory Visit (INDEPENDENT_AMBULATORY_CARE_PROVIDER_SITE_OTHER): Payer: Self-pay | Admitting: Family Medicine

## 2018-06-05 ENCOUNTER — Other Ambulatory Visit: Payer: Self-pay

## 2018-06-05 ENCOUNTER — Encounter: Payer: Self-pay | Admitting: Family Medicine

## 2018-06-05 DIAGNOSIS — F411 Generalized anxiety disorder: Secondary | ICD-10-CM | POA: Insufficient documentation

## 2018-06-05 MED ORDER — FLUOXETINE HCL 10 MG PO CAPS: 10.0000 mg | ORAL_CAPSULE | Freq: Every day | ORAL | 3 refills | Status: DC

## 2018-06-05 NOTE — Assessment & Plan Note (Addendum)
Patient did not start prozac as planned at last visit as she has been having some anxiety symptoms and has been evaluated by psychiatry in 2016 who had recommended Prozac at that time. Per patient request, a printed rx given today and she was instructed via spanish video interpreter on starting 10 mg daily.  She will follow-up in 1 month.

## 2018-06-05 NOTE — Patient Instructions (Addendum)
Fluoxetine is anxiety medication    Fluoxetine capsules or tablets (Depression/Mood Disorders) Qu es este medicamento? La FLUOXETINA pertenece a un grupo de medicamentos llamados inhibidores selectivos de la recaptacin de serotonina (ISRS). Ayuda a tratar problemas de estado de nimo, tales como depresin, trastorno obsesivo-compulsivo y trastorno de pnico. Tambin puede tratar ciertos trastornos de la alimentacin. Este medicamento puede ser utilizado para otros usos; si tiene alguna pregunta consulte con su proveedor de atencin mdica o con su farmacutico. MARCAS COMUNES: Prozac Qu le debo informar a mi profesional de la salud antes de tomar este medicamento? Necesitan saber si usted presenta alguno de los [employer] o situaciones: trastorno bipolar o antecedentes familiares de trastorno bipolar trastornos de sangrado glaucoma enfermedad cardiaca enfermedad heptica bajos niveles de sodio en la sangre convulsiones ideas, planes o intento de suicidio; si usted o alguien de su familia ha intentado suicidarse previamente si toma IMAO, tales como Carbex, Eldepryl, Marplan, Nardil y Parnate si toma medicamentos que tratan o previenen cogulos sanguneos enfermedad tiroidea una reaccin alrgica o inusual a la fluoxetina, a otros medicamentos, alimentos, colorantes o conservantes si est embarazada o buscando quedar embarazada si est amamantando a un beb Cmo debo utilizar este medicamento? Tome este medicamento por va oral con un vaso de agua. Siga las instrucciones de la etiqueta del [place]. Puede tomar este medicamento con o sin alimentos. Tome su medicamento a intervalos regulares. No lo tome con una frecuencia mayor a la indicada. No deje de tomar Coca[employer]Cola de repente a menos que as lo indique su mdico. Dejar de Insurance account manager medicamento demasiado rpido puede causar efectos secundarios graves o podra empeorar su afeccin. Su farmacutico le dar una Gua del  medicamento especial (MedGuide, nombre en ingls) con cada receta y en cada ocasin que la vuelva a surtir. Asegrese de leer esta informacin cada vez cuidadosamente. Hable con su pediatra para informarse acerca del uso de este medicamento en nios. Aunque este medicamento se puede recetar a nios tan pequeos como de 7 aos de edad con ciertas afecciones, existen precauciones que deben tomarse. Sobredosis: Pngase en contacto inmediatamente con un centro toxicolgico o una sala de urgencia si usted cree que haya tomado demasiado medicamento. ATENCIN: [employer] es solo para usted. No comparta este medicamento con nadie. Qu sucede si me olvido de una dosis? Si olvida una dosis, sltese la dosis [place] y vuelva al horario habitual de sus dosis. No tome dosis adicionales o dobles. Qu puede interactuar con este medicamento? No tome este medicamento con ninguno de los siguientes frmacos: otros medicamentos que contengan fluoxetina, tales como Sarafem o Symbyax cisaprida linezolida IMAO, tales como [place], Eldepryl, Marplan, Nardil y Parnate azul de metileno (inyectado en una vena) pimozida tioridazina Este medicamento tambin puede Counselling psychologist con los siguientes medicamentos: alcohol anfetaminas aspirina y medicamentos tipo aspirina carbamazepina ciertos medicamentos para la depresin, ansiedad o trastornos psicticos ciertos medicamentos para la migraa, tales como almotriptn, eletriptn, frovatriptn, naratriptn, rizatriptn, sumatriptn y zolmitriptn digoxina diurticos fentanilo flecainida furazolidona isoniazida litio medicamentos para conciliar el sueo medicamentos que tratan o previenen cogulos sanguneos, como warfarina, enoxaparina y dalteparina [place], medicamentos para Conservation officer, historic buildings y la inflamacin, como ibuprofeno o naproxeno fenitona procarbazina propafenona rasagilina ritonavir suplementos tales como hierba de [place], kava kava y valeriana tramadol triptfano vinblastina Puede  ser que esta lista no menciona todas las posibles interacciones. Informe a su profesional de [employer] Home	[place] de [employer] productos a base de hierbas, medicamentos de [place] o suplementos nutritivos que est tomando.  Si usted fuma, consume bebidas alcohlicas o si utiliza drogas ilegales, indqueselo tambin a su profesional de KB Home	[place]. Algunas sustancias pueden interactuar con su medicamento. A qu debo estar atento al usar Coca-Cola? Informe a su mdico si sus sntomas no mejoran o si empeoran. Visite a su mdico o a su profesional de la salud para chequear su evolucin peridicamente. Debido que puede ser necesario tomar este medicamento durante varias semanas para que sea posible observar sus efectos en forma [place], es importante que sigue su tratamiento como recetado por su mdico. Los pacientes y sus familias deben estar atentos si empeora la depresin o ideas suicidas. Tambin est atento a cambios repentinos o severos de emocin, tales como el sentirse ansioso, agitado, lleno de pnico, irritable, hostil, agresivo, impulsivo, inquietud severa, demasiado excitado y hiperactivo o dificultad para conciliar el sueo. Si esto ocurre, especialmente al comenzar con el tratamiento o al cambiar de dosis, comunquese con su mdico. Puede experimentar somnolencia o [employer]. No conduzca ni utilice maquinaria, ni haga nada que Associate Professor en estado de alerta hasta que sepa cmo le afecta este medicamento. No se siente ni se ponga de pie con rapidez, especialmente si es un paciente de edad avanzada. Esto reduce el riesgo de mareos o [employer]. El alcohol puede interferir con el efecto de este medicamento. Evite consumir bebidas alcohlicas. Se le podr secar la boca. Masticar chicle sin azcar, chupar caramelos duros y tomar agua en abundancia le ayudar a mantener la boca hmeda. Si el problema no desaparece o es severo, consulte a su mdico. Este medicamento puede afectar sus niveles de Designer, television/film set. Si tiene diabetes, consulte con su mdico o profesional de la salud antes de cambiar su dieta o la dosis de su medicamento para la diabetes. Qu efectos secundarios puedo tener al [employer] este medicamento? Efectos secundarios que debe informar a su mdico o a Barrister's clerk de la salud tan pronto como sea posible: Chief of Staff, como erupcin cutnea, comezn/picazn o urticarias, e hinchazn de la cara, los labios o la lengua ansiedad heces de color negro y aspecto alquitranado problemas respiratorios cambios en la visin confusin estado de nimo elevado, menor necesidad de dormir, pensamientos acelerados, conducta impulsiva dolor ocular ritmo cardiaco rpido, irregular sensacin de desmayos o aturdimiento, cadas sensacin de agitacin, enojo o irritabilidad alucinaciones, prdida del contacto con la realidad prdida de equilibrio o coordinacin prdida de memoria ereccin dolorosa o prolongada inquietud, caminar de un lado a otro, incapacidad para quedarse quieto convulsiones rigidez de los Apple Computer ideas suicidas u otros cambios en el estado de nimo dificultad para conciliar el sueo sangrado o moretones inusuales cansancio o debilidad inusual vmito Efectos secundarios que generalmente no requieren atencin mdica (infrmelos a su mdico o a Barrister's clerk de la salud si persisten o si son molestos): cambios en el apetito o el peso cambios en el deseo o desempeo sexual diarrea boca seca dolor de cabeza aumento de la sudoracin nuseas temblores Puede ser que esta lista no menciona todos los posibles efectos secundarios. Comunquese a su mdico por asesoramiento mdico Humana Inc. Usted puede informar los efectos secundarios a la FDA por telfono al 1-800-FDA-1088. Dnde debo guardar mi medicina? Mantngala fuera del alcance de los nios. [place] a [employer], entre 15 y 67 grados C (43 y 70 grados F). Deseche todo el medicamento que no haya  utilizado, despus de la fecha de vencimiento. ATENCIN: Este folleto es un resumen. Puede ser que no [place] toda la  posible informacin. Si usted tiene preguntas acerca de esta medicina, consulte con su mdico, su farmacutico o su profesional de Technical sales engineer.  2018 Elsevier/Gold Standard (2016-11-08 00:00:00)

## 2018-06-05 NOTE — Progress Notes (Signed)
    Subjective:  Karla Garner is a 32 y.o. female who presents to the [facility] today for anxiety follow up  HPI:  Still having lots of anxiety, it never goes away. Has urge to eat ice and her anxiety is under better control when she eats ice. Anxiety has been worse since she has not been eating ice. But when she eats ice her face gets swollen. States she is only on a blood pressure medication at home. Was never able to start the prozac because she says the pharmacy never gave it to her  ROS: Per HPI  Objective:  Physical Exam: BP 100/70   Pulse (!) 105   Temp 99.2 F (37.3 C) (Oral)   Wt 92 lb (41.7 kg)   LMP 05/15/2018   SpO2 97%   BMI 17.97 kg/m   Gen: NAD, resting comfortably Pulm: NWOB on room air Skin: warm, dry Neuro: grossly normal, moves all extremities Psych: Normal affect and thought content  GAD 7 : Generalized Anxiety Score 06/05/2018  Nervous, Anxious, on Edge 3  Control/stop worrying 1  Worry too much - different things 3  Trouble relaxing 3  Restless 1  Easily annoyed or irritable 3  Afraid - awful might happen 3  Total GAD 7 Score 17  Anxiety Difficulty Extremely difficult     Assessment/Plan:  Anxiety state Patient did not start prozac as planned at last visit as she has been having some anxiety symptoms and has been evaluated by psychiatry in 2016 who had recommended Prozac at that time. Per patient request, a printed rx given today and she was instructed via spanish video interpreter on starting 10 mg daily.  She will follow-up in 1 month.   Bufford Lope, DO PGY-3, [facility] 06/05/2018 4:00 PM

## 2018-07-08 ENCOUNTER — Ambulatory Visit: Payer: Self-pay | Admitting: Family Medicine

## 2018-08-05 ENCOUNTER — Ambulatory Visit (INDEPENDENT_AMBULATORY_CARE_PROVIDER_SITE_OTHER): Payer: Self-pay | Admitting: Licensed Clinical Social Worker

## 2018-08-05 DIAGNOSIS — F439 Reaction to severe stress, unspecified: Secondary | ICD-10-CM
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
## 2018-08-14 ENCOUNTER — Ambulatory Visit: Payer: Self-pay | Admitting: Family Medicine

## 2018-10-02 ENCOUNTER — Other Ambulatory Visit (HOSPITAL_COMMUNITY): Payer: Self-pay | Admitting: Nephrology

## 2018-10-02 DIAGNOSIS — Z992 Dependence on renal dialysis: Secondary | ICD-10-CM

## 2018-10-02 DIAGNOSIS — N186 End stage renal disease: Secondary | ICD-10-CM

## 2018-10-02 DIAGNOSIS — I770 Arteriovenous fistula, acquired: Secondary | ICD-10-CM

## 2018-10-06 ENCOUNTER — Other Ambulatory Visit: Payer: Self-pay | Admitting: Student

## 2018-10-07 ENCOUNTER — Encounter (HOSPITAL_COMMUNITY): Payer: Self-pay

## 2018-10-07 ENCOUNTER — Ambulatory Visit (HOSPITAL_COMMUNITY): Admission: RE | Admit: 2018-10-07 | Payer: Self-pay | Source: Ambulatory Visit

## 2018-10-23 ENCOUNTER — Other Ambulatory Visit: Payer: Self-pay

## 2018-10-23 ENCOUNTER — Ambulatory Visit (HOSPITAL_COMMUNITY)
Admission: EM | Admit: 2018-10-23 | Discharge: 2018-10-23 | Disposition: A | Discharge status: Home / Self Care | Payer: Self-pay | Attending: Emergency Medicine | Admitting: Emergency Medicine

## 2018-10-23 ENCOUNTER — Encounter (HOSPITAL_COMMUNITY): Payer: Self-pay | Admitting: Emergency Medicine

## 2018-10-23 DIAGNOSIS — R109 Unspecified abdominal pain: Secondary | ICD-10-CM

## 2018-10-23 DIAGNOSIS — Z3202 Encounter for pregnancy test, result negative: Secondary | ICD-10-CM

## 2018-10-23 DIAGNOSIS — R10817 Generalized abdominal tenderness: Secondary | ICD-10-CM | POA: Insufficient documentation

## 2018-10-23 DIAGNOSIS — N186 End stage renal disease: Secondary | ICD-10-CM | POA: Insufficient documentation

## 2018-10-23 DIAGNOSIS — Z992 Dependence on renal dialysis: Secondary | ICD-10-CM | POA: Insufficient documentation

## 2018-10-23 DIAGNOSIS — I120 Hypertensive chronic kidney disease with stage 5 chronic kidney disease or end stage renal disease: Secondary | ICD-10-CM | POA: Insufficient documentation

## 2018-10-23 DIAGNOSIS — N939 Abnormal uterine and vaginal bleeding, unspecified: Secondary | ICD-10-CM | POA: Insufficient documentation

## 2018-10-23 DIAGNOSIS — F5089 Other specified eating disorder: Secondary | ICD-10-CM | POA: Insufficient documentation

## 2018-10-23 LAB — POCT PREGNANCY, URINE: Preg Test, Ur: NEGATIVE

## 2018-10-23 LAB — POCT URINALYSIS DIP (DEVICE)
Bilirubin Urine: NEGATIVE
Glucose, UA: 250 mg/dL — AB
Ketones, ur: NEGATIVE mg/dL
Nitrite: NEGATIVE
PH: 8.5 — AB (ref 5.0–8.0)
Protein, ur: 100 mg/dL — AB
SPECIFIC GRAVITY, URINE: 1.015 (ref 1.005–1.030)
Urobilinogen, UA: 0.2 mg/dL (ref 0.0–1.0)

## 2018-10-23 MED ORDER — MEGESTROL ACETATE 40 MG PO TABS: 40.0000 mg | ORAL_TABLET | Freq: Two times a day (BID) | ORAL | 0 refills | Status: DC

## 2018-10-23 NOTE — Discharge Instructions (Addendum)
Please have them draw blood tomorrow to check your hemoglobin level since you have had persistent bleeding Begin megace twice daily to help stop bleeding  We will call in 2-3 days to inform of results of vaginal swab- gonorrhea, chlamydia, trichomonias, bacterial vagonsis, yeast Please follow up with OBGYN for further evaluation of abnormal bleeding and pain/ pap smear

## 2018-10-23 NOTE — ED Triage Notes (Addendum)
PT has had menstrual bleeding for 17 days. PT reports abdominal pain and cramping. No medicine changes. Pt reports some dizziness. PT reports some weakness as well.   For the last 3 months, she has had extended periods.   PT is on dialysis MWF. She has been on dialysis for 8 years.   PT reports bruising to legs without injury.

## 2018-10-24 NOTE — ED Provider Notes (Signed)
[place]    CSN: [id number] Arrival date & time: 10/23/18  1549     History   Chief Complaint Chief Complaint  Patient presents with  . Vaginal Bleeding    HPI Spanish interpretation via [employer] interpreter Karla Garner is a 33 y.o. female history of ESRD on hemodialysis, hypertension, presenting today for evaluation of abdominal pain and vaginal bleeding.  Patient states that typically her cycles would come towards the end of the month and only last approximately 3 to 4 days.  Over the past 3 months she has noticed her cycles lasting slightly longer for approximately 5 to 10 days.  This past cycle which she is currently still experiencing has been persisting for 17 days.  She has been passing blood clots.  She has had associated abdominal pain and cramping.  She also has noticed a yellow sticky discharge that has been present when she is not on her cycle over the past couple months as well.  She denies any fevers, but has had decreased appetite.  She also has a felt low energy, slight dizziness and began to feel weak.  She also notes that she has had cravings for ice, dirt and soap.  This is been going on for a while.  She states that at dialysis they check her blood work every 2 weeks.  She is due for them to check it on Monday.  She goes to dialysis Monday Wednesday Friday.  She also is concerned that she has had low sex drive.  HPI  Past Medical History:  Diagnosis Date  . Anemia   . Depression   . 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 Glomerulonephritis   . Hemodialysis patient ([place])    Tu, Th, Sat  . Hypertension   . PONV (postoperative nausea and vomiting)    was on dialysis i during pregnancy2014  . Sleep apnea   . Thrombocytopenia ([place]) 2014    Patient Active Problem List   Diagnosis Date Noted  . Anxiety state 06/05/2018  . Healthcare maintenance 11/21/2017  . 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 Depression  11/01/2017  . Swelling of right side of face 11/01/2017  . Acid reflux 11/01/2017  . ESRD on hemodialysis ([place])   . Normocytic anemia 08/02/2015  . Anemia of chronic kidney failure 04/09/2015  . Hypertension 04/30/2013  . 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 A/V FISTULAGRAM N/A 01/08/2018   Procedure: A/V FISTULAGRAM - right arm;  Surgeon: [name], MD;  Location: [facility];  Service: Cardiovascular;  Laterality: N/A;  . AV FISTULA PLACEMENT Left   . AV FISTULA PLACEMENT Right 04/14/2015   Procedure: CREATION OF RIGHT RADIOCEPHALIC ARTERIOVENOUS (AV) FISTULA ;  Surgeon: Angelia Mould, MD;  Location: [facility];  Service: Vascular;  Laterality: Right;  . AV FISTULA PLACEMENT Right 11/22/2016   Procedure: ARTERIOVENOUS (AV) FISTULA CREATION;  Surgeon: [name], MD;  Location: [facility];  Service: Vascular;  Laterality: Right;  . CESAREAN SECTION     2009  . ESOPHAGOGASTRODUODENOSCOPY N/A 11/24/2016   Procedure: ESOPHAGOGASTRODUODENOSCOPY (EGD);  Surgeon: [name], MD;  Location: [facility] END[facility]COPY;  Service: Endoscopy;  Laterality: N/A;  . EXCHANGE OF A DIALYSIS CATHETER Right 04/14/2015   Procedure: EXCHANGE OF RIGHT INTERNAL JUGULAR DIALYSIS CATHETER;  Surgeon: Angelia Mould, MD;  Location: [facility];  Service: Vascular;  Laterality: Right;  . INSERTION  OF DIALYSIS CATHETER Left 11/22/2016   Procedure: INSERTION OF DIALYSIS CATHETER - LEFT INTERNAL JUGULAR PLACEMENT;  Surgeon: [name], MD;  Location: [facility];  Service: Vascular;  Laterality: Left;  . IR DIALY SHUNT INTRO [place] W/IMG RIGHT Right 07/26/2017  . IR DIALY SHUNT INTRO NEEDLE/INTRACATH INITIAL W/IMG RIGHT Right 10/18/2017  . IR DIALY SHUNT INTRO NEEDLE/INTRACATH INITIAL W/IMG RIGHT Right 03/10/2018  . IR REMOVAL TUN CV CATH W/O FL  03/06/2017  . LIGATION OF ARTERIOVENOUS  FISTULA Left 11/05/2014    Procedure: LIGATION OF LEFT ARM  BRACHIO-CEPHALIC ARTERIOVENOUS  FISTULA ,& REPAIR OF BRACHIAL ARTERY.;  Surgeon: Mal [name], MD;  Location: [facility];  Service: Vascular;  Laterality: Left;  . PERIPHERAL VASCULAR BALLOON ANGIOPLASTY  01/08/2018   Procedure: PERIPHERAL VASCULAR BALLOON ANGIOPLASTY;  Surgeon: [name], MD;  Location: [facility];  Service: Cardiovascular;;  innominate vein  . THROMBECTOMY W/ EMBOLECTOMY Right 11/16/2016   Procedure: THROMBECTOMY REVISION OF ARTERIOVENOUS FISTULA - RIGHT ARM;  Surgeon: [name], MD;  Location: [facility];  Service: Vascular;  Laterality: Right;  . TUBAL LIGATION  2014    OB History    Gravida  3   Para  3   Term  1   Preterm  2   AB      Living  2     SAB      TAB      Ectopic      Multiple      Live Births  2            Home Medications    Prior to Admission medications   Medication Sig Start Date End Date Taking? Authorizing Provider  ferric citrate (AURYXIA) 1 GM 210 MG(Fe) tablet Take 420 mg by mouth 3 (three) times daily with meals. 2 tabs with meals 3 times daily, and 1 tab with snacks 2 times daily   Yes [provider]  folic acid-vitamin b complex-vitamin c-selenium-zinc (DIALYVITE) 3 MG TABS tablet Take 1 tablet by mouth daily.   Yes [provider]  multivitamin (RENA-VIT) TABS tablet Take 1 tablet by mouth daily.   Yes [provider]  megestrol (MEGACE) 40 MG tablet Take 1 tablet (40 mg total) by mouth 2 (two) times daily. 10/23/18   Wieters, Elesa Hacker, PA-C    Family History Family History  Problem Relation Age of Onset  . Other Mother        no medical problems per patient  . Other Father        no medical problems per patient  . Colon cancer Neg Hx   . Liver cancer Neg Hx   . Stomach cancer Neg Hx     Social History Social History   Tobacco Use  . Smoking status: Never Smoker  . Smokeless tobacco: Never Used  Substance Use Topics  . Alcohol  use: No    Alcohol/week: 0.0 standard drinks  . Drug use: No     Allergies   No known allergies   Review of Systems Review of Systems  Constitutional: Positive for activity change, appetite change and fatigue. Negative for fever.  HENT: Positive for congestion and rhinorrhea.   Respiratory: Negative for cough and shortness of breath.   Cardiovascular: Negative for chest pain.  Gastrointestinal: Positive for abdominal pain. Negative for diarrhea, nausea and vomiting.  Genitourinary: Positive for menstrual problem, vaginal bleeding and vaginal discharge. Negative for dysuria, flank pain, genital sores, hematuria and  vaginal pain.  Musculoskeletal: Negative for back pain.  Skin: Negative for rash.  Neurological: Positive for dizziness and weakness. Negative for syncope, light-headedness and headaches.     Physical Exam Triage Vital Signs ED Triage Vitals  Enc Vitals Group     BP 10/23/18 1640 (!) 155/84     Pulse Rate 10/23/18 1640 87     Resp 10/23/18 1640 16     Temp 10/23/18 1640 98.4 F (36.9 C)     Temp Source 10/23/18 1640 Oral     SpO2 10/23/18 1640 100 %     Weight --      Height --      Head Circumference --      Peak Flow --      Pain Score 10/23/18 1642 6     Pain Loc --      Pain Edu? --      Excl. in [place]? --    No data found.  Updated Vital Signs BP (!) 155/84   Pulse 87   Temp 98.4 F (36.9 C) (Oral)   Resp 16   LMP 10/07/2018   SpO2 100%   Visual Acuity Right Eye Distance:   Left Eye Distance:   Bilateral Distance:    Right Eye Near:   Left Eye Near:    Bilateral Near:     Physical Exam Vitals signs and nursing note reviewed.  Constitutional:      General: She is not in acute distress.    Appearance: She is well-developed.  HENT:     Head: Normocephalic and atraumatic.     Mouth/Throat:     Comments: Oral mucosa pink and moist, no tonsillar enlargement or exudate. Posterior pharynx patent and nonerythematous, no uvula deviation or  swelling. Normal phonation. Eyes:     Conjunctiva/sclera: Conjunctivae normal.  Neck:     Musculoskeletal: Neck supple.  Cardiovascular:     Rate and Rhythm: Normal rate and regular rhythm.     Heart sounds: No murmur.  Pulmonary:     Effort: Pulmonary effort is normal. No respiratory distress.     Breath sounds: Normal breath sounds.     Comments: Breathing comfortably at rest, CTABL, no wheezing, rales or other adventitious sounds auscultated Abdominal:     Palpations: Abdomen is soft.     Tenderness: There is abdominal tenderness.     Comments: Generalized tenderness throughout abdomen, negative rebound, no focal tenderness, discomfort does appear increased to the lower quadrants.  Genitourinary:    Comments: Normal external female genitalia, no sources of bleeding visualized externally, bright red blood visualized in vagina, blood appears to be exiting os, patient experiences moderate discomfort with exam.  Patient does have tenderness with palpation of cervix and adnexal areas, no masses palpated. Skin:    General: Skin is warm and dry.  Neurological:     Mental Status: She is alert.      UC Treatments / Results  Labs (all labs ordered are listed, but only abnormal results are displayed) Labs Reviewed  POCT URINALYSIS DIP (DEVICE) - Abnormal; Notable for the following components:      Result Value   Glucose, UA 250 (*)    Hgb urine dipstick LARGE (*)    pH 8.5 (*)    Protein, ur 100 (*)    Leukocytes, UA TRACE (*)    All other components within normal limits  POC URINE PREG, ED  POCT PREGNANCY, URINE  CERVICOVAGINAL ANCILLARY ONLY    EKG None  Radiology No results found.  Procedures Procedures (including critical care time)  Medications Ordered in UC Medications - No data to display  Initial Impression / Assessment and Plan / UC Course  I have reviewed the triage vital signs and the nursing notes.  Pertinent labs & imaging results that were available  during my care of the patient were reviewed by me and considered in my medical decision making (see chart for details).     Patient with vaginal bleeding.  Pregnancy test negative.  Vaginal swab obtained to check for causes of discharge for possible underlying STDs causing abdominal pain and prolonged bleeding.  Patient denies concern for this, will hold off on empiric treatment.  Will call patient with results and provide any further treatment if needed.  I distad attempted to obtain given length of symptoms and pica symptoms.  Unable to successfully stick.  Stressed importance of having her hemoglobin checked when she is at dialysis tomorrow.  Advised that this needs to be checked to ensure that she has not lost a significant amount of blood.  Will initiate Megace twice daily to stop the bleeding.  Follow-up with OB/GYN for further evaluation of abnormal bleeding, abdominal discomfort, low sex drive if symptoms persisting and swab results returned negative.  Advised to go to emergency room if having worsening symptoms.Discussed strict return precautions. Patient verbalized understanding and is agreeable with plan.  Final Clinical Impressions(s) / UC Diagnoses   Final diagnoses:  Vaginal bleeding  Pica     Discharge Instructions     Please have them draw blood tomorrow to check your hemoglobin level since you have had persistent bleeding Begin megace twice daily to help stop bleeding  We will call in 2-3 days to inform of results of vaginal swab- gonorrhea, chlamydia, trichomonias, bacterial vagonsis, yeast Please follow up with OBGYN for further evaluation of abnormal bleeding and pain/ pap smear    ED Prescriptions    Medication Sig Dispense Auth. Provider   megestrol (MEGACE) 40 MG tablet Take 1 tablet (40 mg total) by mouth 2 (two) times daily. 12 tablet Wieters, Hawthorne C, PA-C     Controlled Substance Prescriptions [place] Controlled Substance Registry consulted? Not Applicable     Janith Lima, [place] 10/24/18 [phone]

## 2018-10-27 LAB — CERVICOVAGINAL ANCILLARY ONLY
Bacterial vaginitis: NEGATIVE
Candida vaginitis: NEGATIVE
Chlamydia: NEGATIVE
Neisseria Gonorrhea: NEGATIVE
Trichomonas: NEGATIVE

## 2018-10-30 ENCOUNTER — Telehealth (HOSPITAL_COMMUNITY): Payer: Self-pay | Admitting: Emergency Medicine

## 2018-10-30 NOTE — Telephone Encounter (Signed)
Patient arrived in department with questions about test results.  Verified patient identity and informed patient all results negative, provided avs with my chart access to patient.

## 2018-10-31 DIAGNOSIS — L299 Pruritus, unspecified: Secondary | ICD-10-CM | POA: Insufficient documentation

## 2018-11-04 ENCOUNTER — Ambulatory Visit (INDEPENDENT_AMBULATORY_CARE_PROVIDER_SITE_OTHER): Payer: Self-pay | Admitting: Family Medicine

## 2018-11-04 ENCOUNTER — Encounter: Payer: Self-pay | Admitting: Family Medicine

## 2018-11-04 ENCOUNTER — Other Ambulatory Visit: Payer: Self-pay

## 2018-11-04 VITALS — BP 128/70 | Temp 98.4°F | Ht 60.0 in | Wt 96.0 lb

## 2018-11-04 DIAGNOSIS — N920 Excessive and frequent menstruation with regular cycle: Secondary | ICD-10-CM

## 2018-11-04 DIAGNOSIS — R300 Dysuria: Secondary | ICD-10-CM | POA: Insufficient documentation

## 2018-11-04 HISTORY — DX: Dysuria: R30.0

## 2018-11-04 HISTORY — DX: Excessive and frequent menstruation with regular cycle: N92.0

## 2018-11-04 MED ORDER — CEPHALEXIN 250 MG PO CAPS: 250.0000 mg | ORAL_CAPSULE | ORAL | 0 refills | Status: AC

## 2018-11-04 MED ORDER — MEGESTROL ACETATE 40 MG PO TABS: 40.0000 mg | ORAL_TABLET | Freq: Two times a day (BID) | ORAL | 0 refills | Status: DC

## 2018-11-04 NOTE — Assessment & Plan Note (Signed)
  Patient unable to give urine sample today. Will treat for presumptive UTI with keflex 250 mg q48 hours as patient is HD patient. Follow up in 1 week

## 2018-11-04 NOTE — Patient Instructions (Signed)
  We will get ultrasound to look at ovaries and uterus  Take megace twice a day until you come back to see [place]  Take antibiotic every other day for UTI  If you have questions or concerns please do not hesitate to call at [phone].  Lucila Maine, DO PGY-3, [facility] 11/04/2018 5:03 PM

## 2018-11-04 NOTE — Progress Notes (Addendum)
    Subjective:    Patient ID: Karla Garner, female    DOB: 11/16/1985, 33 y.o.   MRN: [id number]   CC: vaginal bleeding  Patient reports 21 days of vaginal bleeding. She was seen in UC on 1/2 for this. She had normal UA, negative STD testing, normal pelvic exam. She was given megace and told to follow up with PCP. Patient took megace for 3 days and bleeding stopped so she stopped the medication. The bleeding then returned. She resumed megace and ran out of it. She is now bleeding again. Patient reports normal periods every month until now. She is not on any hormonal birth control. She had her blood counts checked at HD yesterday and was told they were low but she does not know the number. She also endorses pain and burning with urination, urgency, and feeling like she is not completely emptying her bladder. She reports she typically has normal urine 3-4 times a day despite ESRD.   Smoking status reviewed- non-smoker  Review of Systems- no SOB, DOE, palpitations, fevers, chills, flank pain, nausea, vomiting   Objective:  BP 128/70   Temp 98.4 F (36.9 C) (Oral)   Ht 5' (1.524 m)   Wt 96 lb (43.5 kg)   LMP 10/07/2018   BMI 18.75 kg/m  Vitals and nursing note reviewed  General: well appearing, well nourished, in no acute distress HEENT: normocephalic, MMM Cardiac: regular rate Respiratory: no increased work of breathing Abdomen: soft, tender to palpation of suprapubic region GU: normal external female genitalia. Mild amount of blood present. Cervix erythematous. No lesion identified. No adnexal masses appreciated. Uterus size appropriate. Extremities: no edema or cyanosis Neuro: alert and oriented, no focal deficits   Assessment & Plan:    Dysuria  Patient unable to give urine sample today. Will treat for presumptive UTI with keflex 250 mg q48 hours as patient is HD patient. Follow up in 1 week   Menorrhagia with regular cycle  Unclear cause of abnormal uterine  bleeding. Will obtain CBC today to check blood counts and TSH to r/o thyroid etiology for bleeding. Pelvic US ordered to be done 1/20. Will have pt follow up 1/21 in clinic to discuss results. Refill of megace given until then. Patient would benefit from mirena insertion to control bleeding pending [place] results.     Return in about 1 week (around 11/11/2018).   Lucila Maine, DO Family Medicine Resident PGY-3

## 2018-11-04 NOTE — Assessment & Plan Note (Signed)
  Unclear cause of abnormal uterine bleeding. Will obtain CBC today to check blood counts and TSH to r/o thyroid etiology for bleeding. Pelvic US ordered to be done 1/20. Will have pt follow up 1/21 in clinic to discuss results. Refill of megace given until then. Patient would benefit from mirena insertion to control bleeding pending [place] results.

## 2018-11-05 LAB — CBC
Hematocrit: 33.9 % — ABNORMAL LOW (ref 34.0–46.6)
Hemoglobin: 11.1 g/dL (ref 11.1–15.9)
MCH: 29.4 pg (ref 26.6–33.0)
MCHC: 32.7 g/dL (ref 31.5–35.7)
MCV: 90 fL (ref 79–97)
Platelets: 186 10*3/uL (ref 150–450)
RBC: 3.77 x10E6/uL (ref 3.77–5.28)
RDW: 14.9 % (ref 11.7–15.4)
WBC: 7.5 10*3/uL (ref 3.4–10.8)

## 2018-11-05 LAB — TSH: TSH: 4.86 u[IU]/mL — ABNORMAL HIGH (ref 0.450–4.500)

## 2018-11-10 ENCOUNTER — Ambulatory Visit (HOSPITAL_COMMUNITY)
Admission: RE | Admit: 2018-11-10 | Discharge: 2018-11-10 | Disposition: A | Discharge status: Home / Self Care | Payer: Self-pay | Source: Ambulatory Visit | Attending: Family Medicine | Admitting: Family Medicine

## 2018-11-10 DIAGNOSIS — N920 Excessive and frequent menstruation with regular cycle: Secondary | ICD-10-CM | POA: Insufficient documentation

## 2018-11-11 ENCOUNTER — Ambulatory Visit (INDEPENDENT_AMBULATORY_CARE_PROVIDER_SITE_OTHER): Payer: Self-pay | Admitting: Family Medicine

## 2018-11-11 ENCOUNTER — Other Ambulatory Visit: Payer: Self-pay

## 2018-11-11 VITALS — BP 112/54 | HR 91 | Temp 98.5°F | Wt 98.4 lb

## 2018-11-11 DIAGNOSIS — R300 Dysuria: Secondary | ICD-10-CM

## 2018-11-11 DIAGNOSIS — N920 Excessive and frequent menstruation with regular cycle: Secondary | ICD-10-CM

## 2018-11-11 MED ORDER — CEPHALEXIN 250 MG PO CAPS: 250.0000 mg | ORAL_CAPSULE | ORAL | 0 refills | Status: DC

## 2018-11-11 NOTE — Progress Notes (Signed)
    Subjective:    Patient ID: Karla Garner, female    DOB: 1986/02/18, 33 y.o.   MRN: [id number]   CC: follow up AUB  HPI: patient had [place] yesterday but was unable to tolerate exam so findings were limited. She states she had to stop it because the transvaginal portion of exam hurt too much.  On megace, now passing "white tissue with pink streaks" in it.   Feels improved with 3 doses of keflex but some burning with urination there. Requesting refill. No flank pain, no fevers, chills, nausea, or vomiting.  Smoking status reviewed- non-smoker  Review of Systems- see HPI   Objective:  BP (!) 112/54   Pulse 91   Temp 98.5 F (36.9 C) (Oral)   Wt 98 lb 6 oz (44.6 kg)   SpO2 99%   BMI 19.21 kg/m  Vitals and nursing note reviewed  General: well nourished, in no acute distress HEENT: normocephalic, MMM Cardiac: regular rate Respiratory: no increased work of breathing Abdomen: soft, nontender, nondistended, +BS, no CVA tenderness Neuro: alert and oriented, no focal deficits   Assessment & Plan:    1. Menorrhagia with regular cycle Advised patient to discontinue use of megace as she was no longer having blood pass but "white tissue". She is uanble to tolerate a physical exam or ultrasound transvaginal probe. Will refer to gynecology for further management. Follow up if bleeding returns and is severe before she is able to see gynecology. Patient verbalized understanding and agreement with plan. - Ambulatory referral to Obstetrics / Gynecology  2. Dysuria Refill keflex for 2 more doses q 48 hours, return if she develops fever or signs of systemic illness.   Return in about 2 weeks (around 11/25/2018).   Lucila Maine, DO Family Medicine Resident PGY-3

## 2018-11-11 NOTE — Patient Instructions (Signed)
Te he referido a ginecologa para discutir ms a fondo el manejo de Engineer, technical sales. Se le llamar para programar una cita.  Sus recuentos sanguneos fueron normales.  Si su sangrado empeora, vuelva a ser visto.

## 2018-11-13 ENCOUNTER — Encounter (HOSPITAL_COMMUNITY): Payer: Self-pay

## 2018-11-13 ENCOUNTER — Emergency Department (HOSPITAL_COMMUNITY)
Admission: EM | Admit: 2018-11-13 | Discharge: 2018-11-14 | Disposition: A | Discharge status: Home / Self Care | Payer: Self-pay | Attending: Emergency Medicine | Admitting: Emergency Medicine

## 2018-11-13 DIAGNOSIS — N939 Abnormal uterine and vaginal bleeding, unspecified: Secondary | ICD-10-CM | POA: Insufficient documentation

## 2018-11-13 DIAGNOSIS — Z79899 Other long term (current) drug therapy: Secondary | ICD-10-CM | POA: Insufficient documentation

## 2018-11-13 DIAGNOSIS — Z992 Dependence on renal dialysis: Secondary | ICD-10-CM | POA: Insufficient documentation

## 2018-11-13 DIAGNOSIS — R102 Pelvic and perineal pain: Secondary | ICD-10-CM | POA: Insufficient documentation

## 2018-11-13 DIAGNOSIS — N186 End stage renal disease: Secondary | ICD-10-CM | POA: Insufficient documentation

## 2018-11-13 DIAGNOSIS — D631 Anemia in chronic kidney disease: Secondary | ICD-10-CM | POA: Insufficient documentation

## 2018-11-13 DIAGNOSIS — N189 Chronic kidney disease, unspecified: Secondary | ICD-10-CM

## 2018-11-13 LAB — CBC WITH DIFFERENTIAL/PLATELET
Abs Immature Granulocytes: 0.02 10*3/uL (ref 0.00–0.07)
BASOS PCT: 0 %
Basophils Absolute: 0 10*3/uL (ref 0.0–0.1)
EOS ABS: 0.2 10*3/uL (ref 0.0–0.5)
Eosinophils Relative: 3 %
HCT: 32 % — ABNORMAL LOW (ref 36.0–46.0)
Hemoglobin: 10.1 g/dL — ABNORMAL LOW (ref 12.0–15.0)
Immature Granulocytes: 0 %
Lymphocytes Relative: 17 %
Lymphs Abs: 1.2 10*3/uL (ref 0.7–4.0)
MCH: 29.7 pg (ref 26.0–34.0)
MCHC: 31.6 g/dL (ref 30.0–36.0)
MCV: 94.1 fL (ref 80.0–100.0)
Monocytes Absolute: 1 10*3/uL (ref 0.1–1.0)
Monocytes Relative: 15 %
Neutro Abs: 4.4 10*3/uL (ref 1.7–7.7)
Neutrophils Relative %: 65 %
Platelets: 182 10*3/uL (ref 150–400)
RBC: 3.4 MIL/uL — ABNORMAL LOW (ref 3.87–5.11)
RDW: 15.1 % (ref 11.5–15.5)
WBC: 6.8 10*3/uL (ref 4.0–10.5)
nRBC: 0 % (ref 0.0–0.2)

## 2018-11-13 LAB — I-STAT BETA HCG BLOOD, ED (MC, WL, AP ONLY): I-stat hCG, quantitative: <5 m[IU]/mL (ref <5)

## 2018-11-13 LAB — BASIC METABOLIC PANEL
Anion gap: 16 — ABNORMAL HIGH (ref 5–15)
BUN: 60 mg/dL — ABNORMAL HIGH (ref 6–20)
CO2: 24 mmol/L (ref 22–32)
CREATININE: 10.44 mg/dL — AB (ref 0.44–1.00)
Calcium: 9.5 mg/dL (ref 8.9–10.3)
Chloride: 95 mmol/L — ABNORMAL LOW (ref 98–111)
GFR calc Af Amer: 5 mL/min — ABNORMAL LOW (ref >60)
GFR calc non Af Amer: 4 mL/min — ABNORMAL LOW (ref >60)
Glucose, Bld: 101 mg/dL — ABNORMAL HIGH (ref 70–99)
Potassium: 4.8 mmol/L (ref 3.5–5.1)
Sodium: 135 mmol/L (ref 135–145)

## 2018-11-13 NOTE — ED Triage Notes (Signed)
Pt endorses vaginal bleeding x 2 months with abd cramping. Pt is on dialysis last dialysis yesterday. VSS.

## 2018-11-14 ENCOUNTER — Telehealth (HOSPITAL_COMMUNITY): Payer: Self-pay | Admitting: Emergency Medicine

## 2018-11-14 MED ORDER — TRAMADOL HCL 50 MG PO TABS: 50.0000 mg | ORAL_TABLET | Freq: Four times a day (QID) | ORAL | 0 refills | Status: DC | PRN

## 2018-11-14 MED ORDER — MEGESTROL ACETATE 40 MG PO TABS: 40.0000 mg | ORAL_TABLET | Freq: Two times a day (BID) | ORAL | 0 refills | Status: DC

## 2018-11-14 NOTE — ED Provider Notes (Signed)
[facility] EMERGENCY DEPARTMENT Provider Note   CSN: [id number] Arrival date & time: 11/13/18  1702     History   Chief Complaint Chief Complaint  Patient presents with  . Vaginal Bleeding    HPI Karla Garner is a 33 y.o. female.  The history is provided by the patient. A language interpreter was used.  Vaginal Bleeding  She has history of end-stage renal disease on hemodialysis, hypertension, thrombocytopenia, depression and comes in because of persistent vaginal bleeding.  She has been having vaginal bleeding for the last 2 months.  She did been having normal menses prior to that.  She has been seen in her doctor's office and has been on 3 courses of megestrol.  Each time, the bleeding stopped only to start again.  She had been sent for ultrasound, but was unable to tolerate transvaginal ultrasound and was unable to tolerate pelvic exam in the office.  Tonight, she passed a large clot.  She is also having some mild lower abdominal cramping associated with this.  There is no nausea or vomiting.  She denies fever or chills.  Past Medical History:  Diagnosis Date  . Anemia   . Depression   . 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 Glomerulonephritis   . Hemodialysis patient ([place])    Tu, Th, Sat  . Hypertension   . PONV (postoperative nausea and vomiting)    was on dialysis i during pregnancy2014  . Sleep apnea   . Thrombocytopenia ([place]) 2014    Patient Active Problem List   Diagnosis Date Noted  . Menorrhagia with regular cycle 11/04/2018  . Dysuria 11/04/2018  . Anxiety state 06/05/2018  . Healthcare maintenance 11/21/2017  . 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 Depression 11/01/2017  . Swelling of right side of face 11/01/2017  . Acid reflux 11/01/2017  . ESRD on hemodialysis ([place])   . Normocytic anemia 08/02/2015  . Anemia of chronic kidney failure 04/09/2015  . Hypertension 04/30/2013  . Glomerulonephritis,  IgA 04/30/2013    Past Surgical History:  Procedure Laterality Date  . A/V FISTULAGRAM N/A 01/08/2018   Procedure: A/V FISTULAGRAM - right arm;  Surgeon: [name], MD;  Location: [facility] CV LAB;  Service: Cardiovascular;  Laterality: N/A;  . AV FISTULA PLACEMENT Left   . AV FISTULA PLACEMENT Right 04/14/2015   Procedure: CREATION OF RIGHT RADIOCEPHALIC ARTERIOVENOUS (AV) FISTULA ;  Surgeon: Angelia Mould, MD;  Location: [facility];  Service: Vascular;  Laterality: Right;  . AV FISTULA PLACEMENT Right 11/22/2016   Procedure: ARTERIOVENOUS (AV) FISTULA CREATION;  Surgeon: [name], MD;  Location: [facility];  Service: Vascular;  Laterality: Right;  . CESAREAN SECTION     2009  . ESOPHAGOGASTRODUODENOSCOPY N/A 11/24/2016   Procedure: ESOPHAGOGASTRODUODENOSCOPY (EGD);  Surgeon: [name], MD;  Location[facility] ENDOSCOPY;  Service: Endoscopy;  Laterality: N/A;  . EXCHANGE OF A DIALYSIS CATHETER Right 04/14/2015   Procedure: EXCHANGE OF RIGHT INTERNAL JUGULAR DIALYSIS CATHETER;  Surgeon: Angelia Mould, MD;  Location: [facility];  Service: Vascular;  Laterality: Right;  . INSERTION OF DIALYSIS CATHETER Left 11/22/2016   Procedure: INSERTION OF DIALYSIS CATHETER - LEFT INTERNAL JUGULAR PLACEMENT;  Surgeon: [name], MD;  Location: [place] [facility];  Service: Vascular;  Laterality: Left;  . IR DIALY SHUNT INTRO [place] W/IMG RIGHT Right 07/26/2017  . IR DIALY SHUNT INTRO NEEDLE/INTRACATH INITIAL W/IMG RIGHT Right 10/18/2017  . IR  DIALY SHUNT INTRO NEEDLE/INTRACATH INITIAL W/IMG RIGHT Right 03/10/2018  . IR REMOVAL TUN CV CATH W/O FL  03/06/2017  . LIGATION OF ARTERIOVENOUS  FISTULA Left 11/05/2014   Procedure: LIGATION OF LEFT ARM  BRACHIO-CEPHALIC ARTERIOVENOUS  FISTULA ,& REPAIR OF BRACHIAL ARTERY.;  Surgeon: Mal [name], MD;  Location: [facility];  Service: Vascular;  Laterality: Left;  . PERIPHERAL VASCULAR BALLOON ANGIOPLASTY  01/08/2018    Procedure: PERIPHERAL VASCULAR BALLOON ANGIOPLASTY;  Surgeon: [name], MD;  Location: [facility] CV LAB;  Service: Cardiovascular;;  innominate vein  . THROMBECTOMY W/ EMBOLECTOMY Right 11/16/2016   Procedure: THROMBECTOMY REVISION OF ARTERIOVENOUS FISTULA - RIGHT ARM;  Surgeon: [name], MD;  Location: [facility];  Service: Vascular;  Laterality: Right;  . TUBAL LIGATION  2014     OB History    Gravida  3   Para  3   Term  1   Preterm  2   AB      Living  2     SAB      TAB      Ectopic      Multiple      Live Births  2            Home Medications    Prior to Admission medications   Medication Sig Start Date End Date Taking? Authorizing Provider  ferric citrate (AURYXIA) 1 GM 210 MG(Fe) tablet Take 420 mg by mouth 3 (three) times daily with meals. 2 tabs with meals 3 times daily, and 1 tab with snacks 2 times daily   Yes [provider]  megestrol (MEGACE) 40 MG tablet Take 1 tablet (40 mg total) by mouth 2 (two) times daily. 11/04/18  Yes Riccio, Angela C, DO  cephALEXin (KEFLEX) 250 MG capsule Take 1 capsule (250 mg total) by mouth every other day. 11/11/18   Steve Rattler, DO    Family History Family History  Problem Relation Age of Onset  . Other Mother        no medical problems per patient  . Other Father        no medical problems per patient  . Colon cancer Neg Hx   . Liver cancer Neg Hx   . Stomach cancer Neg Hx     Social History Social History   Tobacco Use  . Smoking status: Never Smoker  . Smokeless tobacco: Never Used  Substance Use Topics  . Alcohol use: No    Alcohol/week: 0.0 standard drinks  . Drug use: No     Allergies   No known allergies   Review of Systems Review of Systems  Genitourinary: Positive for vaginal bleeding.  All other systems reviewed and are negative.    Physical Exam Updated Vital Signs BP (!) 165/98 (BP Location: Left Arm)   Pulse 99   Temp 98.4 F (36.9 C) (Oral)    Resp 17   SpO2 100%   Physical Exam Vitals signs and nursing note reviewed.    33 year old female, resting comfortably and in no acute distress. Vital signs are significant for elevated blood pressure. Oxygen saturation is 100%, which is normal. Head is normocephalic and atraumatic. PERRLA, EOMI. Oropharynx is clear. Neck is nontender and supple without adenopathy or JVD. Back is nontender and there is no CVA tenderness. Lungs are clear without rales, wheezes, or rhonchi. Chest is nontender. Heart has regular rate and rhythm without murmur. Abdomen is soft, flat, with mild tenderness rather diffusely  through the lower abdomen.  There is no rebound or guarding.  There are no masses or hepatosplenomegaly and peristalsis is normoactive. Extremities have no cyanosis or edema, full range of motion is present.  AV fistula present in the right upper arm with thrill present. Skin is warm and dry without rash. Neurologic: Mental status is normal, cranial nerves are intact, there are no motor or sensory deficits.  ED Treatments / Results  Labs (all labs ordered are listed, but only abnormal results are displayed) Labs Reviewed  CBC WITH DIFFERENTIAL/PLATELET - Abnormal; Notable for the following components:      Result Value   RBC 3.40 (*)    Hemoglobin 10.1 (*)    HCT 32.0 (*)    All other components within normal limits  BASIC METABOLIC PANEL - Abnormal; Notable for the following components:   Chloride 95 (*)    Glucose, Bld 101 (*)    BUN 60 (*)    Creatinine, Ser 10.44 (*)    GFR calc non Af Amer 4 (*)    GFR calc Af Amer 5 (*)    Anion gap 16 (*)    All other components within normal limits  I-STAT BETA HCG BLOOD, ED ([facility], [facility], [facility] ONLY)   Procedures Procedures   Medications Ordered in ED Medications - No data to display   Initial Impression / Assessment and Plan / ED Course  I have reviewed the triage vital signs and the nursing notes.  Pertinent lab results that were  available during my care of the patient were reviewed by me and considered in my medical decision making (see chart for details).  Persistent abnormal vaginal bleeding and pelvic cramping.  Old records reviewed confirming several office visits with complaints of vaginal bleeding, treated with several courses of megestrol.  Also, reports that she was unable to tolerate pelvic exam in the office or vaginal probe on pelvic ultrasound.  She is refusing pelvic exam tonight.  She had been told by her primary care provider that they could not give her any additional megestrol.  Case was discussed with Dr. Unknown Foley, on-call for gynecology who recommends keeping her on megestrol 40 mg twice daily and states that she will work to get the patient an appointment as soon as possible.  This information was explained to the patient who expressed understanding.  She is discharged with prescription for megestrol and also tramadol for pain.  Final Clinical Impressions(s) / ED Diagnoses   Final diagnoses:  Abnormal vaginal bleeding  End-stage renal disease on hemodialysis ([place])  Anemia associated with chronic renal failure  Pelvic pain in female    ED Discharge Orders         Ordered    megestrol (MEGACE) 40 MG tablet  2 times daily     11/14/18 0056    traMADol (ULTRAM) 50 MG tablet  Every 6 hours PRN     11/14/18 0240           [name], MD 97/35/32 0101

## 2018-11-14 NOTE — Telephone Encounter (Signed)
Patient's original tramadol prescription went to [facility].  [facility] has no tramadol in stock in any of their pharmacies.  Pharmacy needs to be switched to the [facility].  Patient prefers to go to the one on [address] [place].  The original prescription was canceled.  Trying to write a new prescription for tramadol No. 10 tablets.  Documentation

## 2018-12-03 ENCOUNTER — Encounter: Payer: Self-pay | Admitting: Obstetrics and Gynecology

## 2018-12-03 ENCOUNTER — Ambulatory Visit (INDEPENDENT_AMBULATORY_CARE_PROVIDER_SITE_OTHER): Payer: Self-pay | Admitting: Obstetrics and Gynecology

## 2018-12-03 VITALS — BP 125/81 | HR 106 | Wt 94.8 lb

## 2018-12-03 DIAGNOSIS — R102 Pelvic and perineal pain: Secondary | ICD-10-CM

## 2018-12-03 DIAGNOSIS — N898 Other specified noninflammatory disorders of vagina: Secondary | ICD-10-CM

## 2018-12-03 DIAGNOSIS — Z3042 Encounter for surveillance of injectable contraceptive: Secondary | ICD-10-CM

## 2018-12-03 DIAGNOSIS — N938 Other specified abnormal uterine and vaginal bleeding: Secondary | ICD-10-CM

## 2018-12-03 MED ORDER — MEDROXYPROGESTERONE ACETATE 150 MG/ML IM SUSP: 150.0000 mg | Freq: Once | INTRAMUSCULAR | Status: DC

## 2018-12-03 MED ORDER — MEDROXYPROGESTERONE ACETATE 150 MG/ML IM SUSP
150.0000 mg | INTRAMUSCULAR | Status: AC
  Administered 2018-12-03: 150 mg via INTRAMUSCULAR

## 2018-12-03 NOTE — Progress Notes (Signed)
33 yo [id number] who is here for the evaluation of DUB and lower abdominal pain. Patient reports the pain has been present for several months. She describes it as a pulsating pain in her vagina which radiates to her lower pelvic bilaterally. She reports some dyspareunia as well and decrease sexual drive. She states that approximately 8 years ago she was given injections during one of her hospitalization which helped her with her libido. Patient reports 3 months of daily bleeding which was managed intermittently with good results with megace.   Past Medical History:  Diagnosis Date  . Anemia   . Depression   . ESRD (end stage renal disease) ([place])    from IgA nephritis  . Glomerulonephritis   . Hemodialysis patient ([place])    Tu, Th, Sat  . Hypertension   . PONV (postoperative nausea and vomiting)    was on dialysis i during pregnancy2014  . Sleep apnea   . Thrombocytopenia ([place]) 2014   Past Surgical History:  Procedure Laterality Date  . A/V FISTULAGRAM N/A 01/08/2018   Procedure: A/V FISTULAGRAM - right arm;  Surgeon: [name], MD;  Location: [place] CV LAB;  Service: Cardiovascular;  Laterality: N/A;  . AV FISTULA PLACEMENT Left   . AV FISTULA PLACEMENT Right 04/14/2015   Procedure: CREATION OF RIGHT RADIOCEPHALIC ARTERIOVENOUS (AV) FISTULA ;  Surgeon: Angelia Mould, MD;  Location: [facility];  Service: Vascular;  Laterality: Right;  . AV FISTULA PLACEMENT Right 11/22/2016   Procedure: ARTERIOVENOUS (AV) FISTULA CREATION;  Surgeon: [name], MD;  Location: [facility];  Service: Vascular;  Laterality: Right;  . CESAREAN SECTION     2009  . ESOPHAGOGASTRODUODENOSCOPY N/A 11/24/2016   Procedure: ESOPHAGOGASTRODUODENOSCOPY (EGD);  Surgeon: [name], MD;  Location: [facility] ENDOSCOPY;  Service: Endoscopy;  Laterality: N/A;  . EXCHANGE OF A DIALYSIS CATHETER Right 04/14/2015   Procedure: EXCHANGE OF RIGHT INTERNAL JUGULAR DIALYSIS CATHETER;  Surgeon: Angelia Mould, MD;  Location: [place];  Service: Vascular;  Laterality: Right;  . INSERTION OF DIALYSIS CATHETER Left 11/22/2016   Procedure: INSERTION OF DIALYSIS CATHETER - LEFT INTERNAL JUGULAR PLACEMENT;  Surgeon: [name], MD;  Location: [place] [facility];  Service: Vascular;  Laterality: Left;  . IR DIALY SHUNT INTRO [place] W/IMG RIGHT Right 07/26/2017  . IR DIALY SHUNT INTRO NEEDLE/INTRACATH INITIAL W/IMG RIGHT Right 10/18/2017  . IR DIALY SHUNT INTRO NEEDLE/INTRACATH INITIAL W/IMG RIGHT Right 03/10/2018  . IR REMOVAL TUN CV CATH W/O FL  03/06/2017  . LIGATION OF ARTERIOVENOUS  FISTULA Left 11/05/2014   Procedure: LIGATION OF LEFT ARM  BRACHIO-CEPHALIC ARTERIOVENOUS  FISTULA ,& REPAIR OF BRACHIAL ARTERY.;  Surgeon: Mal [name], MD;  Location: [place];  Service: Vascular;  Laterality: Left;  . PERIPHERAL VASCULAR BALLOON ANGIOPLASTY  01/08/2018   Procedure: PERIPHERAL VASCULAR BALLOON ANGIOPLASTY;  Surgeon: [name], MD;  Location: [place] CV LAB;  Service: Cardiovascular;;  innominate vein  . THROMBECTOMY W/ EMBOLECTOMY Right 11/16/2016   Procedure: THROMBECTOMY REVISION OF ARTERIOVENOUS FISTULA - RIGHT ARM;  Surgeon: [name], MD;  Location: [facility];  Service: Vascular;  Laterality: Right;  . TUBAL LIGATION  2014   Family History  Problem Relation Age of Onset  . Other Mother        no medical problems per patient  . Other Father        no medical problems per patient  . Colon cancer Neg Hx   . Liver cancer Neg  Hx   . Stomach cancer Neg Hx    Social History   Tobacco Use  . Smoking status: Never Smoker  . Smokeless tobacco: Never Used  Substance Use Topics  . Alcohol use: No    Alcohol/week: 0.0 standard drinks  . Drug use: No   ROS See pertinent in HPI  Blood pressure 125/81, pulse (!) 106, weight 94 lb 12.8 oz (43 kg), unknown if currently breastfeeding.  GENERAL: Well-developed, well-nourished female in no acute distress.  ABDOMEN:  Soft, nontender, nondistended. No organomegaly. PELVIC: patient declined due to pain EXTREMITIES: No cyanosis, clubbing, or edema, 2+ distal pulses.  A/P 33 yo with DUB and pelvic pain - patient declined pelvic exam and agreed to self swab - Patient is willing to repeat pelvic ultrasound - Patient noted to have abnormal THS on 1/14. Repeat thyroid studies today and cbc - Patient agrees to medical management of her DUB with depo-provera - Patient will be contacted with results and further management

## 2018-12-04 ENCOUNTER — Telehealth: Payer: Self-pay | Admitting: *Deleted

## 2018-12-04 ENCOUNTER — Encounter: Payer: Self-pay | Admitting: *Deleted

## 2018-12-04 LAB — T3, FREE: T3, Free: 3.3 pg/mL (ref 2.0–4.4)

## 2018-12-04 LAB — CBC
HEMATOCRIT: 36.6 % (ref 34.0–46.6)
Hemoglobin: 12.5 g/dL (ref 11.1–15.9)
MCH: 31.3 pg (ref 26.6–33.0)
MCHC: 34.2 g/dL (ref 31.5–35.7)
MCV: 92 fL (ref 79–97)
Platelets: 282 10*3/uL (ref 150–450)
RBC: 4 x10E6/uL (ref 3.77–5.28)
RDW: 15.9 % — AB (ref 11.7–15.4)
WBC: 6 10*3/uL (ref 3.4–10.8)

## 2018-12-04 LAB — T4, FREE: Free T4: 1.42 ng/dL (ref 0.82–1.77)

## 2018-12-04 LAB — TSH: TSH: 3.38 u[IU]/mL (ref 0.450–4.500)

## 2018-12-04 NOTE — Telephone Encounter (Signed)
Called patient with [place] [phone] at mobile / home number and was told by female that this is his number and he doesn't know her. I confirmed number. I also called second number listed as other number and heard message number is restricted or unavailable. Will send letter.

## 2018-12-04 NOTE — Telephone Encounter (Signed)
-----   Message from Mora Bellman, MD sent at 12/04/2018  9:39 AM EST ----- Please inform patient of normal thyroid studies and no evidence of anemia

## 2018-12-05 LAB — CERVICOVAGINAL ANCILLARY ONLY
Bacterial vaginitis: NEGATIVE
Candida vaginitis: NEGATIVE

## 2018-12-08 ENCOUNTER — Ambulatory Visit (HOSPITAL_COMMUNITY)
Admission: RE | Admit: 2018-12-08 | Discharge: 2018-12-08 | Disposition: A | Discharge status: Home / Self Care | Payer: Self-pay | Source: Ambulatory Visit | Attending: Obstetrics and Gynecology | Admitting: Obstetrics and Gynecology

## 2018-12-08 DIAGNOSIS — N938 Other specified abnormal uterine and vaginal bleeding: Secondary | ICD-10-CM | POA: Insufficient documentation

## 2018-12-08 DIAGNOSIS — R102 Pelvic and perineal pain: Secondary | ICD-10-CM | POA: Insufficient documentation

## 2018-12-09 ENCOUNTER — Telehealth: Payer: Self-pay

## 2018-12-09 ENCOUNTER — Other Ambulatory Visit: Payer: Self-pay

## 2018-12-09 DIAGNOSIS — N938 Other specified abnormal uterine and vaginal bleeding: Secondary | ICD-10-CM

## 2018-12-09 NOTE — Telephone Encounter (Signed)
Called pt using [place] id# [phone] from [employer] to give pt [place] results and to give next [place] appt on 03/09/19 @ 3:45p to arrive at 3:30p with full bladder. Pt verbalized understanding.

## 2018-12-09 NOTE — Telephone Encounter (Signed)
-----   Message from Mora Bellman, MD sent at 12/08/2018  4:41 PM EST ----- Please inform patient of normal pelvic ultrasound with the exception of a small cyst on her right ovary. No intervention is needed at this time but I would like to repeat pelvic ultrasound in 3 months to ensure that it has resolved (please schedule). If it remains present or increases in size, only then would we talk about surgical intervention.   In the mean time, please continue depo-provera to control your menstrual cycle  Thanks  [employer]

## 2018-12-24 ENCOUNTER — Other Ambulatory Visit (HOSPITAL_COMMUNITY): Payer: Self-pay | Admitting: Nephrology

## 2018-12-24 DIAGNOSIS — I871 Compression of vein: Secondary | ICD-10-CM

## 2018-12-29 ENCOUNTER — Other Ambulatory Visit: Payer: Self-pay | Admitting: Student

## 2018-12-30 ENCOUNTER — Encounter (HOSPITAL_COMMUNITY): Payer: Self-pay

## 2018-12-30 ENCOUNTER — Ambulatory Visit (HOSPITAL_COMMUNITY)
Admission: RE | Admit: 2018-12-30 | Discharge: 2018-12-30 | Disposition: A | Discharge status: Home / Self Care | Payer: Self-pay | Source: Ambulatory Visit | Attending: Nephrology | Admitting: Nephrology

## 2018-12-30 ENCOUNTER — Other Ambulatory Visit (HOSPITAL_COMMUNITY): Payer: Self-pay | Admitting: Nephrology

## 2018-12-30 DIAGNOSIS — Y841 Kidney dialysis as the cause of abnormal reaction of the patient, or of later complication, without mention of misadventure at the time of the procedure: Secondary | ICD-10-CM | POA: Insufficient documentation

## 2018-12-30 DIAGNOSIS — Z992 Dependence on renal dialysis: Secondary | ICD-10-CM | POA: Insufficient documentation

## 2018-12-30 DIAGNOSIS — I770 Arteriovenous fistula, acquired: Secondary | ICD-10-CM

## 2018-12-30 DIAGNOSIS — I871 Compression of vein: Secondary | ICD-10-CM

## 2018-12-30 DIAGNOSIS — N186 End stage renal disease: Secondary | ICD-10-CM | POA: Insufficient documentation

## 2018-12-30 DIAGNOSIS — T82858A Stenosis of vascular prosthetic devices, implants and grafts, initial encounter: Secondary | ICD-10-CM | POA: Insufficient documentation

## 2018-12-30 DIAGNOSIS — D649 Anemia, unspecified: Secondary | ICD-10-CM | POA: Insufficient documentation

## 2018-12-30 DIAGNOSIS — F329 Major depressive disorder, single episode, unspecified: Secondary | ICD-10-CM | POA: Insufficient documentation

## 2018-12-30 DIAGNOSIS — G4730 Sleep apnea, unspecified: Secondary | ICD-10-CM | POA: Insufficient documentation

## 2018-12-30 DIAGNOSIS — I120 Hypertensive chronic kidney disease with stage 5 chronic kidney disease or end stage renal disease: Secondary | ICD-10-CM | POA: Insufficient documentation

## 2018-12-30 HISTORY — PX: IR TRANSCATH PLC STENT 1ST ART NOT LE CV CAR VERT CAR: IMG5443

## 2018-12-30 HISTORY — PX: IR DIALY SHUNT INTRO NEEDLE/INTRACATH INITIAL W/IMG RIGHT: IMG6115

## 2018-12-30 HISTORY — PX: IR US GUIDE VASC ACCESS RIGHT: IMG2390

## 2018-12-30 HISTORY — PX: IR VENO/EXT/UNI LEFT: IMG675

## 2018-12-30 LAB — CBC
HCT: 35.7 % — ABNORMAL LOW (ref 36.0–46.0)
Hemoglobin: 11.3 g/dL — ABNORMAL LOW (ref 12.0–15.0)
MCH: 29.4 pg (ref 26.0–34.0)
MCHC: 31.7 g/dL (ref 30.0–36.0)
MCV: 93 fL (ref 80.0–100.0)
Platelets: 188 10*3/uL (ref 150–400)
RBC: 3.84 MIL/uL — ABNORMAL LOW (ref 3.87–5.11)
RDW: 16.8 % — AB (ref 11.5–15.5)
WBC: 5.5 10*3/uL (ref 4.0–10.5)
nRBC: 0 % (ref 0.0–0.2)

## 2018-12-30 LAB — BASIC METABOLIC PANEL
Anion gap: 15 (ref 5–15)
BUN: 47 mg/dL — ABNORMAL HIGH (ref 6–20)
CO2: 26 mmol/L (ref 22–32)
Calcium: 10.7 mg/dL — ABNORMAL HIGH (ref 8.9–10.3)
Chloride: 94 mmol/L — ABNORMAL LOW (ref 98–111)
Creatinine, Ser: 8.36 mg/dL — ABNORMAL HIGH (ref 0.44–1.00)
GFR calc Af Amer: 7 mL/min — ABNORMAL LOW (ref >60)
GFR calc non Af Amer: 6 mL/min — ABNORMAL LOW (ref >60)
Glucose, Bld: 89 mg/dL (ref 70–99)
Potassium: 5.5 mmol/L — ABNORMAL HIGH (ref 3.5–5.1)
Sodium: 135 mmol/L (ref 135–145)

## 2018-12-30 LAB — PROTIME-INR
INR: 1 (ref 0.8–1.2)
Prothrombin Time: 13.5 seconds (ref 11.4–15.2)

## 2018-12-30 LAB — APTT: aPTT: 29 seconds (ref 24–36)

## 2018-12-30 MED ORDER — MIDAZOLAM HCL 2 MG/2ML IJ SOLN
INTRAMUSCULAR | Status: AC | PRN
  Administered 2018-12-30: 0.5 mg via INTRAVENOUS
  Administered 2018-12-30: 1 mg via INTRAVENOUS
  Administered 2018-12-30 (×3): 0.5 mg via INTRAVENOUS

## 2018-12-30 MED ORDER — OXYCODONE-ACETAMINOPHEN 5-325 MG PO TABS
2.0000 | ORAL_TABLET | Freq: Once | ORAL | Status: AC
  Administered 2018-12-30: 2 via ORAL
  Filled 2018-12-30: qty 2

## 2018-12-30 MED ORDER — FENTANYL CITRATE (PF) 100 MCG/2ML IJ SOLN
INTRAMUSCULAR | Status: AC | PRN
  Administered 2018-12-30: 25 ug via INTRAVENOUS
  Administered 2018-12-30: 50 ug via INTRAVENOUS
  Administered 2018-12-30 (×4): 25 ug via INTRAVENOUS

## 2018-12-30 MED ORDER — CEFAZOLIN SODIUM-DEXTROSE 2-4 GM/100ML-% IV SOLN
INTRAVENOUS | Status: AC
  Administered 2018-12-30: 2 g via INTRAVENOUS
  Filled 2018-12-30: qty 100

## 2018-12-30 MED ORDER — OXYCODONE-ACETAMINOPHEN 10-325 MG PO TABS: 1.0000 | ORAL_TABLET | Freq: Four times a day (QID) | ORAL | 0 refills | Status: AC | PRN

## 2018-12-30 MED ORDER — LIDOCAINE HCL 1 % IJ SOLN
INTRAMUSCULAR | Status: AC
  Filled 2018-12-30: qty 20

## 2018-12-30 MED ORDER — SODIUM CHLORIDE 0.9 % IV BOLUS
250.0000 mL | Freq: Once | INTRAVENOUS | Status: AC
  Administered 2018-12-30: 250 mL via INTRAVENOUS

## 2018-12-30 MED ORDER — FENTANYL CITRATE (PF) 100 MCG/2ML IJ SOLN
INTRAMUSCULAR | Status: AC
  Filled 2018-12-30: qty 4

## 2018-12-30 MED ORDER — HYDROCODONE-ACETAMINOPHEN 5-325 MG PO TABS: 1.0000 | ORAL_TABLET | ORAL | Status: DC | PRN

## 2018-12-30 MED ORDER — IOPAMIDOL (ISOVUE-300) INJECTION 61%
INTRAVENOUS | Status: AC
  Administered 2018-12-30: 10 mL
  Filled 2018-12-30: qty 100

## 2018-12-30 MED ORDER — MIDAZOLAM HCL 2 MG/2ML IJ SOLN
INTRAMUSCULAR | Status: AC
  Filled 2018-12-30: qty 4

## 2018-12-30 MED ORDER — LIDOCAINE HCL 1 % IJ SOLN
INTRAMUSCULAR | Status: AC | PRN
  Administered 2018-12-30: 15 mL

## 2018-12-30 MED ORDER — IOPAMIDOL (ISOVUE-300) INJECTION 61%
INTRAVENOUS | Status: AC
  Administered 2018-12-30: 75 mL
  Filled 2018-12-30: qty 100

## 2018-12-30 MED ORDER — CEFAZOLIN SODIUM-DEXTROSE 2-4 GM/100ML-% IV SOLN
2.0000 g | Freq: Once | INTRAVENOUS | Status: AC
  Administered 2018-12-30: 2 g via INTRAVENOUS

## 2018-12-30 MED ORDER — SODIUM CHLORIDE 0.9 % IV SOLN
INTRAVENOUS | Status: DC
  Administered 2018-12-30: 11:00:00 via INTRAVENOUS

## 2018-12-30 MED ORDER — HYDROMORPHONE HCL 1 MG/ML IJ SOLN
1.0000 mg | INTRAMUSCULAR | Status: DC | PRN
  Administered 2018-12-30: 1 mg via INTRAVENOUS
  Filled 2018-12-30: qty 1

## 2018-12-30 NOTE — Progress Notes (Signed)
Per RN patient pain improved after administration of Percocet 5/325 two tabs PO, ready for d/c.   Per Dr. Katrinka Blazing request I have e-prescribed Percocet 10/325 mg tablet 1 tab PO Q6H PRN for pain x 3 days to [place] [address] [place].   Patient encouraged to call IR with questions or concerns.   Candiss Norse, PA-C

## 2018-12-30 NOTE — Sedation Documentation (Signed)
Pt in IR Room 1, cardaic monitoring on, PIV patent, 2L O2 via EtCO2 Karla Garner started.  Pt stated she had dialysis yesterday and they removed 3L fluid.  +bruit/thrill to RUE fistula

## 2018-12-30 NOTE — Procedures (Signed)
Interventional Radiology Procedure Note  Procedure: RUE fistulogram with recanalization of right BCV occlusion and stenting to 14 mm.   Complications: None  Estimated Blood Loss: None  Recommendations: - Pain control - DC home - F/U in IR clinic in 4 weeks   Signed,  Criselda Peaches, MD

## 2018-12-30 NOTE — H&P (Signed)
Chief Complaint: Patient was seen in consultation today for fistulagram with possible angioplasty, possible stent placement.  Referring Physician(s): Coladonato,Joseph  Supervising Physician: Jacqulynn Cadet  Patient Status: [name] - Out-pt  History of Present Illness: Karla Garner is a 33 y.o. female with a past medical history significant for depression, anemia, HTN, glomerulonephritis and ESRD on HD via right AVF who presents today for a fistulagram with possible intervention. Patient is well known to IR due to previous fistulagrams and venograms - most recently 03/10/18. 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  She reports that she has not had any recent upper arm swelling and her fistula has been working well lately - it was last used yesterday without issue although she does not that she experienced a drop in her blood pressure. Patient was seen in conjunction with Dr. Laurence Ferrari today to discuss possible interventions given known chronic occlusion - after thorough discussion with patient via interpreter she wishes to proceed with fistulagram and additional possible angioplasty/stenting.  Patient reports that she feels fine except for a headache because she is hungry. Blood pressure has been well controlled except for one instance of hypotension yesterday at HD. She has not missed an HD appointments and notes no trouble with her fistula. She denies any other complaints and is ready to proceed with procedure.   Past Medical History:  Diagnosis Date  . Anemia   . Depression   . ESRD (end stage renal disease) ([place])    from IgA nephritis  . Glomerulonephritis   . Hemodialysis patient ([place])    Tu, Th, Sat  . Hypertension   . PONV (postoperative nausea and vomiting)    was on dialysis i during pregnancy2014  . Sleep apnea   . Thrombocytopenia ([place]) 2014    Past Surgical  History:  Procedure Laterality Date  . A/V FISTULAGRAM N/A 01/08/2018   Procedure: A/V FISTULAGRAM - right arm;  Surgeon: [name], MD;  Location: [place] CV [facility];  Service: Cardiovascular;  Laterality: N/A;  . AV FISTULA PLACEMENT Left   . AV FISTULA PLACEMENT Right 04/14/2015   Procedure: CREATION OF RIGHT RADIOCEPHALIC ARTERIOVENOUS (AV) FISTULA ;  Surgeon: Angelia Mould, MD;  Location: [place];  Service: Vascular;  Laterality: Right;  . AV FISTULA PLACEMENT Right 11/22/2016   Procedure: ARTERIOVENOUS (AV) FISTULA CREATION;  Surgeon: [name], MD;  Location: [place];  Service: Vascular;  Laterality: Right;  . CESAREAN SECTION     2009  . ESOPHAGOGASTRODUODENOSCOPY N/A 11/24/2016   Procedure: ESOPHAGOGASTRODUODENOSCOPY (EGD);  Surgeon: [name], MD;  Location: [facility] ENDOSCOPY;  Service: Endoscopy;  Laterality: N/A;  . EXCHANGE OF A DIALYSIS CATHETER Right 04/14/2015   Procedure: EXCHANGE OF RIGHT INTERNAL JUGULAR DIALYSIS CATHETER;  Surgeon: Angelia Mould, MD;  Location: [place];  Service: Vascular;  Laterality: Right;  . INSERTION OF DIALYSIS CATHETER Left 11/22/2016   Procedure: INSERTION OF DIALYSIS CATHETER - LEFT INTERNAL JUGULAR PLACEMENT;  Surgeon: [name], MD;  Location: [place];  Service: Vascular;  Laterality: Left;  . IR DIALY SHUNT INTRO [place] W/IMG RIGHT Right 07/26/2017  . IR DIALY SHUNT INTRO NEEDLE/INTRACATH INITIAL W/IMG RIGHT Right 10/18/2017  . IR DIALY SHUNT INTRO NEEDLE/INTRACATH INITIAL W/IMG RIGHT Right 03/10/2018  . IR REMOVAL TUN CV CATH W/O FL  03/06/2017  . LIGATION OF ARTERIOVENOUS  FISTULA Left 11/05/2014   Procedure: LIGATION OF LEFT ARM  BRACHIO-CEPHALIC ARTERIOVENOUS  FISTULA ,& REPAIR OF BRACHIAL ARTERY.;  Surgeon: Mal [name], MD;  Location: [place];  Service: Vascular;  Laterality: Left;  . PERIPHERAL VASCULAR BALLOON ANGIOPLASTY  01/08/2018   Procedure: PERIPHERAL VASCULAR BALLOON  ANGIOPLASTY;  Surgeon: [name], MD;  Location: [place] [facility];  Service: Cardiovascular;;  innominate vein  . THROMBECTOMY W/ EMBOLECTOMY Right 11/16/2016   Procedure: THROMBECTOMY REVISION OF ARTERIOVENOUS FISTULA - RIGHT ARM;  Surgeon: [name], MD;  Location: [place];  Service: Vascular;  Laterality: Right;  . TUBAL LIGATION  2014    Allergies: No known allergies  Medications: Prior to Admission medications   Medication Sig Start Date End Date Taking? Authorizing Provider  ferric citrate (AURYXIA) 1 GM 210 MG(Fe) tablet Take 420 mg by mouth 3 (three) times daily with meals. 2 tabs with meals 3 times daily, and 1 tab with snacks 2 times daily   Yes [provider]  traMADol (ULTRAM) 50 MG tablet Take 1 tablet (50 mg total) by mouth every 6 (six) hours as needed. 11/14/18  Yes Fredia Sorrow, MD  cephALEXin (KEFLEX) 250 MG capsule Take 1 capsule (250 mg total) by mouth every other day. Patient not taking: Reported on 12/03/2018 11/11/18   Steve Rattler, DO  megestrol (MEGACE) 40 MG tablet Take 1 tablet (40 mg total) by mouth 2 (two) times daily. Patient not taking: Reported on 5/53/7482 04/27/85   [name], MD     Family History  Problem Relation Age of Onset  . Other Mother        no medical problems per patient  . Other Father        no medical problems per patient  . Colon cancer Neg Hx   . Liver cancer Neg Hx   . Stomach cancer Neg Hx     Social History   Socioeconomic History  . Marital status: Legally Separated    Spouse name: Not on file  . Number of children: 3  . Years of education: Not on file  . Highest education level: Not on file  Occupational History  . Occupation: unemployed  Social Needs  . Financial resource strain: Not hard at all  . Food insecurity:    Worry: Never true    Inability: Never true  . Transportation needs:    Medical: No    Non-medical: No  Tobacco Use  . Smoking status: Never Smoker  . Smokeless  tobacco: Never Used  Substance and Sexual Activity  . Alcohol use: No    Alcohol/week: 0.0 standard drinks  . Drug use: No  . Sexual activity: Yes    Partners: Male  Lifestyle  . Physical activity:    Days per week: 0 days    Minutes per session: Not on file  . Stress: Very much  Relationships  . Social connections:    Talks on phone: Not on file    Gets together: Not on file    Attends religious service: Not on file    Active member of club or organization: Not on file    Attends meetings of clubs or organizations: Not on file    Relationship status: Not on file  Other Topics Concern  . Not on file  Social History Narrative   As of February 2018 she has 3 children with whom she lives they are aged 57, 35 and 2.      Review of Systems: A 12 point ROS discussed and pertinent positives are  indicated in the HPI above.  All other systems are negative.  Review of Systems  Constitutional: Negative for chills and fever.  Respiratory: Negative for cough and shortness of breath.   Cardiovascular: Negative for chest pain.  Gastrointestinal: Negative for abdominal pain, diarrhea, nausea and vomiting.  Musculoskeletal: Negative for back pain.  Skin: Negative for color change.  Neurological: Positive for headaches. Negative for dizziness and syncope.    Vital Signs: BP (!) 146/88 (BP Location: Left Arm)   Pulse 97   Temp 98.6 F (37 C) (Oral)   Resp 20   Ht 5' (1.524 m)   Wt 95 lb (43.1 kg)   SpO2 97%   BMI 18.55 kg/m   Physical Exam Vitals signs reviewed.  Constitutional:      General: She is not in acute distress.    Comments: Interpreter present during discussion and exam. Family member also at bedside.  HENT:     Head: Normocephalic.  Cardiovascular:     Rate and Rhythm: Normal rate and regular rhythm.     Comments: (+) right AVF (+) thrill, (+) bruit. No erythema, edema, bruising or open wounds to right upper extremity over area of fistula. Pulmonary:     Effort:  Pulmonary effort is normal.     Breath sounds: Normal breath sounds.  Abdominal:     General: There is no distension.     Palpations: Abdomen is soft.     Tenderness: There is no abdominal tenderness.  Skin:    General: Skin is warm and dry.  Neurological:     Mental Status: She is alert and oriented to person, place, and time.  Psychiatric:        Mood and Affect: Mood normal.        Behavior: Behavior normal.        Thought Content: Thought content normal.        Judgment: Judgment normal.      MD Evaluation Airway: WNL Heart: WNL Abdomen: WNL Chest/ Lungs: WNL ASA  Classification: 3 Mallampati/Airway Score: Two   Imaging: US Pelvic Complete With Transvaginal  Result Date: 12/08/2018 CLINICAL DATA:  Dysfunctional uterine bleeding for 3 months, pelvic pain, history of Caesarean section EXAM: TRANSABDOMINAL AND TRANSVAGINAL ULTRASOUND OF PELVIS TECHNIQUE: Both transabdominal and transvaginal ultrasound examinations of the pelvis were performed. Transabdominal technique was performed for global imaging of the pelvis including uterus, ovaries, adnexal regions, and pelvic cul-de-sac. It was necessary to proceed with endovaginal exam following the transabdominal exam to visualize the uterus, endometrium, and ovaries. COMPARISON:  Limited study of 11/10/2018 FINDINGS: Uterus Measurements: 6.2 x 3.5 x 4.4 cm = volume: 50 mL. Retroflexed. Normal morphology without mass. Nabothian cysts at cervix. Endometrium Thickness: 2 mm, normal.  No endometrial fluid or focal abnormality Right ovary Measurements: 4.1 x 2.7 x 3.9 cm = volume: 22.7 mL. 3.1 x 3.1 x 2.1 cm diameter cyst with a thin internal septation. No mural nodularity. Left ovary Measurements: 3.7 x 1.9 x 2.2 cm = volume: 8.0 mL. Normal morphology without mass. Several small hyperechoic foci noted, question fibrosis or small developing calcifications. Other findings No free pelvic fluid.  No adnexal masses. IMPRESSION: Minimally  complicated cyst 3.1 cm greatest diameter within RIGHT ovary containing a single thin septation. Otherwise unremarkable exam. Electronically Signed   By: Lavonia Dana M.D.   On: 12/08/2018 16:27    Labs:  CBC: Recent Labs    11/04/18 1707 11/13/18 1744 12/03/18 1655 12/30/18 0953  WBC 7.5 6.8 6.0  5.5  HGB 11.1 10.1* 12.5 11.3*  HCT 33.9* 32.0* 36.6 35.7*  PLT 186 182 282 188    COAGS: Recent Labs    12/30/18 0953  INR 1.0  APTT 29    BMP: Recent Labs    01/08/18 0732 11/13/18 1744 12/30/18 0953  NA 136 135 135  K 4.7 4.8 5.5*  CL 99* 95* 94*  CO2  --  24 26  GLUCOSE 89 101* 89  BUN 51* 60* 47*  CALCIUM  --  9.5 10.7*  CREATININE 10.30* 10.44* 8.36*  GFRNONAA  --  4* 6*  GFRAA  --  5* 7*    LIVER FUNCTION TESTS: No results for input(s): BILITOT, AST, ALT, ALKPHOS, PROT, ALBUMIN in the last 8760 hours.  TUMOR MARKERS: No results for input(s): AFPTM, CEA, CA199, CHROMGRNA in the last 8760 hours.  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 Laurence Ferrari it has been noted several times over the years that she has a chronic right brachiocephalic vein occlusion and it was discussed with patient today that the best course of action would be to perform fistulagram with possible angioplasty/possible stent placement because of this occlusion. After thorough discussion between patient and Dr. Laurence Ferrari via interpreter it was decided to proceed with fistulagram with possible intervention.  Patient has been NPO since 11 pm, she did not take any medications this morning, she does not take blood thinning medications. Afebrile, WBC 5.5, hgb 11.3, plt 188, INR 1.0, creatinine 8.36.  Risks and benefits discussed with the patient including,  but not limited to bleeding, infection, vascular injury, pulmonary embolism, need for tunneled HD catheter placement or even death.  All of the patient's questions were answered, patient is agreeable to proceed.  Consent signed and in chart.  Thank you for this interesting consult.  I greatly enjoyed meeting Karla Garner and look forward to participating in their care.  A copy of this report was sent to the requesting provider on this date.  Electronically Signed: Joaquim Nam, PA-C 12/30/2018, 10:48 AM   I spent a total of   25 Minutes in face to face in clinical consultation, greater than 50% of which was counseling/coordinating care for fistulagram with possible intervention.

## 2018-12-30 NOTE — Progress Notes (Signed)
No bleeding or swelling noted after ambulation 

## 2018-12-30 NOTE — Discharge Instructions (Addendum)
Fistulografa de dilisis, cuidados posteriores Dialysis Fistulogram, Care After Esta hoja le proporciona informacin sobre cmo cuidarse despus del procedimiento. Su mdico tambin podr darle instrucciones ms especficas. Comunquese con su mdico si tiene problemas o preguntas. Qu puedo esperar despus del procedimiento? Despus del procedimiento, es comn [employer] siguientes sntomas:  Una pequea molestia en la zona en la que se coloc el tubo delgado y pequeo (catter) para el procedimiento.  Un pequeo hematoma alrededor de la fstula.  Somnolencia y cansancio Company secretary). Siga estas indicaciones en su casa: Actividad   Haga reposo en su casa y no levante objetos que pesen ms de 5 lb (2,3 kg) el da despus del procedimiento.  Reanude sus actividades normales segn lo indicado por el mdico. Pregntele al mdico qu actividades son seguras para usted.  No conduzca ni use maquinaria pesada mientras toma analgsicos recetados.  No conduzca durante 24horas si recibi un medicamento para ayudarlo a relajarse (sedante) durante el procedimiento. Medicamentos   [employer] de venta libre y los recetados solamente como se lo haya indicado el mdico. Cuidado del Environmental consultant de la puncin  Siga las indicaciones de su mdico acerca de cmo Government social research officer donde se insertaron los catteres. Asegrese de hacer lo siguiente: ? Lvese las manos con agua y jabn antes de [place] manager las vendas (vendaje). Use desinfectante para manos si no dispone de [place] y [place]. ? Cambie el vendaje como se lo haya indicado el mdico. ? No retire los puntos (suturas), la goma para cerrar la piel o las tiras [place]. Es posible que estos cierres cutneos Animal nutritionist en la piel durante 2semanas o ms. Si los bordes de las tiras adhesivas empiezan a despegarse y Therapist, sports, puede recortar los que estn sueltos. No retire las tiras [facility] por completo a menos que el mdico se lo indique.  Controle  la zona de la puncin todos los das para detectar signos de infeccin. Est atento a los siguientes signos: ? Dolor, hinchazn o enrojecimiento. ? Lquido o sangre. ? Calor. ? Pus o mal olor. Instrucciones generales  No tome baos de inmersin, no nade ni use el jacuzzi hasta que el mdico lo autorice. Pregntele al mdico si puede ducharse. Thurston Pounds solo le permitan darse baos de [place].  Controle atentamente la fstula de dilisis. Verifique para asegurarse de que puede sentir una vibracin o un zumbido (frmito) cuando Risk manager los dedos sobre la fstula.  Evite daar el injerto o la fstula: ? No use ropa ajustada ni joyas en el brazo o la pierna que tiene el injerto o la fstula. ? Informe a todos sus mdicos que tiene un injerto o una fstula para dilisis. ? No permita extracciones de sangre, terapias intravenosas ni lecturas de la presin arterial en el brazo que tiene la fstula o el injerto. ? No permita que le apliquen vacunas antigripales ni otras vacunas en el brazo con la fstula o el injerto.  Concurra a todas las visitas de seguimiento como se lo haya indicado el mdico. Esto es importante. Comunquese con un mdico si:  Tiene enrojecimiento, hinchazn o dolor en el lugar donde se insert el catter.  Observa lquido o sangre que proviene del lugar de la insercin del catter.  El lugar de la insercin del catter est caliente al tacto.  Tiene pus o percibe mal olor que proviene del lugar de la insercin del catter.  Tiene fiebre o siente escalofros. Solicite ayuda de inmediato si:  Se siente dbil.  Tiene problemas de equilibrio.  Tiene dificultad para mover los brazos o las piernas.  Tiene problemas visuales o para hablar.  Ya no puede sentir una vibracin o un zumbido cuando [employer] dedos sobre la fstula de dilisis.  La extremidad que se [place] para el procedimiento: ? Se hincha. ? Duele. ? Est fra. ? Cambia de color, por ejemplo, se torna  azulada o blanco plido.  Siente falta de aire o Tourist information centre manager. Resumen  Despus de [place] fistulografa de dilisis, es comn tener una pequea molestia o algunos moretones en la zona donde se coloc el tubo delgado y pequeo (catter).  Descanse en su [place] despus del procedimiento. Reanude sus actividades normales segn lo indicado por el mdico.  Delphi de venta libre y los recetados solamente como se lo haya indicado el mdico.  Siga las indicaciones de su mdico acerca de cmo Government social research officer donde se insert el catter.  Concurra a todas las visitas de seguimiento como se lo haya indicado el mdico. Esta informacin no tiene Marine scientist el consejo del mdico. Asegrese de hacerle al mdico cualquier pregunta que tenga. Document Released: 02/22/2014 Document Revised: 12/11/2017 Document Reviewed: 12/11/2017 Elsevier Interactive Patient Education  2019 [place] sitio femoral Femoral Site Care Esta hoja le brinda informacin sobre cmo cuidarse despus del procedimiento. El mdico tambin podr darle indicaciones ms especficas. Comunquese con su mdico si tiene problemas o preguntas. Qu puedo esperar despus del procedimiento? Despus del procedimiento, es comn [employer] [facility] siguientes sntomas:  Hematomas que suelen desaparecer en el trmino de 1 a 2semanas.  Sensibilidad al tacto en TEFL teacher. Siga estas indicaciones en su casa: Cuidado de la herida  Siga las indicaciones del mdico acerca del cuidado del lugar de la insercin. Asegrese de hacer lo siguiente: ? Lvese las manos con agua y jabn antes de [place] manager las vendas (vendajes). Use desinfectante para manos si no dispone de [place] y [place]. ? Cambie los vendajes como se lo haya indicado el mdico. ? No retire los puntos (suturas), la goma para cerrar la piel o las tiras [place]. Es posible que estos cierres cutneos Animal nutritionist en la piel durante 2semanas o ms.  Si los bordes de las tiras adhesivas empiezan a despegarse y Therapist, sports, puede recortar los que estn sueltos. No retire las tiras [facility] por completo a menos que el mdico se lo indique.  No tome baos de inmersin, no nade ni use el jacuzzi hasta que el mdico lo autorice.  Puede ducharse entre 24y 48horas despus del procedimiento o como se lo haya indicado el mdico. ? Lave el lugar delicadamente con agua y jabn comn. ? Seque bien el rea con una toalla limpia dando golpecitos. ? No se frote en el lugar. Puede ocasionarle una hemorragia.  No se aplique talcos ni lociones en el lugar. Mantenga TEFL teacher limpio y Radiographer, therapeutic.  Psychiatric nurse femoral todos los das para detectar signos de infeccin. Est atento a los siguientes signos: ? Dolor, hinchazn o enrojecimiento. ? Lquido o sangre. ? Calor. ? Pus o mal olor. [employer] primeros 2 o 3 das despus del procedimiento, o como se lo haya indicado el mdico: ? Evite subir escaleras siempre que pueda. ? No se ponga en cuclillas.  No levante ningn objeto que pese ms de 10libras (4,5kg) o el lmite de peso que le hayan indicado, hasta que el mdico le diga que puede [place].  Haga reposo como se le  indic. ? Evite estar sentado durante largos perodos sin moverse. Levntese y camine un poco cada 1 a 2 horas.  No conduzca durante 24horas si le dieron un medicamento para ayudarlo a que se relaje (sedante). Indicaciones generales  Delphi de venta libre y los recetados solamente como se lo haya indicado el mdico.  Consulting civil engineer a todas las visitas de seguimiento como se lo haya indicado el mdico. Esto es importante. Comunquese con un mdico si tiene:  Fiebre o escalofros.  Tiene enrojecimiento, hinchazn o dolor alrededor del lugar de la insercin. Solicite ayuda de inmediato si:  La zona de insercin del catter se hincha muy rpido.  Se desmaya.  Comienza a sudar o la piel se humedece de  manera repentina.  La zona de insercin del catter sangra, y la hemorragia no se detiene cuando ejerce presin constante en la zona.  La zona que est cerca o a poca distancia del lugar de insercin del catter se pone plida o fra, o siente hormigueo o adormecimiento. Estos sntomas pueden representar un problema grave que constituye Engineer, maintenance (IT). No espere hasta que los sntomas desaparezcan. Solicite atencin mdica de inmediato. Comunquese con el servicio de emergencias de su localidad (911 en los [place]). No conduzca por sus propios medios [employer] hospital. Resumen  Despus del procedimiento, es frecuente tener hematomas que suelen desaparecer en el trmino de 1 a 2 semanas.  Controle TEFL teacher femoral todos los das para detectar signos de infeccin.  No levante ningn objeto que pese ms de 10libras (4,5kg) o el lmite de peso que le hayan indicado, hasta que el mdico le diga que puede [place]. Esta informacin no tiene Marine scientist el consejo del mdico. Asegrese de hacerle al mdico cualquier pregunta que tenga. Document Released: 07/23/2014 Document Revised: 11/28/2017 Document Reviewed: 11/28/2017 Elsevier Interactive Patient Education  2019 [place] consciente moderada en los adultos, cuidados posteriores (Moderate Conscious Sedation, Adult, Care After) Estas indicaciones le proporcionan informacin acerca de cmo deber cuidarse despus del procedimiento. El mdico tambin podr darle instrucciones ms especficas. El tratamiento ha sido planificado segn las prcticas mdicas actuales, pero en algunos casos pueden ocurrir problemas. Comunquese con el mdico si tiene algn problema o dudas despus del procedimiento. QU ESPERAR DESPUS DEL PROCEDIMIENTO Despus del procedimiento, es comn:  Sentirse somnoliento durante varias horas.  Sentirse torpe y [employer] de equilibrio durante varias horas.  Perder el sentido de la realidad  durante varias horas.  Vomitar si come [employer]. INSTRUCCIONES PARA EL CUIDADO EN EL HOGAR  Durante al menos 24horas despus del procedimiento:  No haga lo siguiente: ? Participar en actividades que impliquen posibles cadas o lesiones. ? Conducir vehculos. ? Operar maquinarias pesadas. ? Beber alcohol. ? Tomar somnferos o medicamentos que causen somnolencia. ? Firmar documentos legales ni tomar [employer]. ? Cuidar a nios por su cuenta.  Hacer reposo. Comida y bebida  Siga la dieta recomendada por el mdico.  Si vomita: ? Pruebe agua, jugo o sopa cuando usted pueda beber sin vomitar. ? Asegrese de no tener nuseas antes de ingerir alimentos slidos. Instrucciones generales  Permanezca con un adulto responsable hasta que est completamente despierto y consciente.  Tome los medicamentos de venta libre y los recetados solamente como se lo haya indicado el mdico.  Si fuma, no lo haga sin supervisin.  Concurra a todas las visitas de control como se lo haya indicado el mdico. Esto es importante. SOLICITE ATENCIN MDICA SI:  [employer]  teniendo nuseas o vomitando.  Tiene sensacin de desvanecimiento.  Le aparece una erupcin cutnea.  Tiene fiebre. SOLICITE ATENCIN MDICA DE INMEDIATO SI:  Tiene dificultad para respirar. Esta informacin no tiene Marine scientist el consejo del mdico. Asegrese de hacerle al mdico cualquier pregunta que tenga. Document Released: 10/13/2013 Document Revised: 10/29/2014 Document Reviewed: 01/28/2016 Elsevier Interactive Patient Education  2019 [employer].

## 2018-12-30 NOTE — Progress Notes (Signed)
Spoke with Dr. Jeannine Kitten.  Patient still c/o 9/10 pain in right upper arm.  Verbal orders given for pain (see MAR).  Continue to monitor and if received relief, have PA send her home with a prescription.

## 2018-12-30 NOTE — Sedation Documentation (Signed)
IR Tech holding pressure to right groin site, will assess site once IR tech completed.  RUE fistula site has a purse stitch with a bumper, OTA, no drainage noted.

## 2018-12-30 NOTE — Sedation Documentation (Signed)
Dr. Laurence Ferrari at bedside to discuss procedure with patient.  Spanish interpretor at bedside

## 2018-12-30 NOTE — Progress Notes (Signed)
Patient states she feels much relief from percocet.  Spoke with PA Candiss Norse per Dr. Laurence Ferrari request for prescription for home.

## 2018-12-30 NOTE — Sedation Documentation (Addendum)
Notified Joaquim Lai RN at [facility] that pt has elevated K+ at 5.5.  Joaquim Lai to notify their physician

## 2018-12-30 NOTE — Sedation Documentation (Signed)
Notified Dr Laurence Ferrari of BUN/Crea and K+

## 2019-01-06 ENCOUNTER — Ambulatory Visit (HOSPITAL_COMMUNITY): Payer: Self-pay

## 2019-01-06 ENCOUNTER — Other Ambulatory Visit: Payer: Self-pay | Admitting: Interventional Radiology

## 2019-01-06 DIAGNOSIS — N186 End stage renal disease: Secondary | ICD-10-CM

## 2019-01-06 DIAGNOSIS — I871 Compression of vein: Secondary | ICD-10-CM

## 2019-01-06 DIAGNOSIS — Z992 Dependence on renal dialysis: Secondary | ICD-10-CM

## 2019-01-21 ENCOUNTER — Other Ambulatory Visit: Payer: Self-pay

## 2019-03-09 ENCOUNTER — Ambulatory Visit (HOSPITAL_COMMUNITY): Admission: RE | Admit: 2019-03-09 | Payer: Self-pay | Source: Ambulatory Visit

## 2019-07-01 ENCOUNTER — Emergency Department (HOSPITAL_COMMUNITY)
Admission: EM | Admit: 2019-07-01 | Discharge: 2019-07-02 | Disposition: A | Discharge status: Home / Self Care | Payer: Self-pay | Attending: Emergency Medicine | Admitting: Emergency Medicine

## 2019-07-01 ENCOUNTER — Emergency Department (HOSPITAL_COMMUNITY): Payer: Self-pay

## 2019-07-01 ENCOUNTER — Other Ambulatory Visit: Payer: Self-pay

## 2019-07-01 ENCOUNTER — Encounter (HOSPITAL_COMMUNITY): Payer: Self-pay | Admitting: Emergency Medicine

## 2019-07-01 DIAGNOSIS — N186 End stage renal disease: Secondary | ICD-10-CM | POA: Insufficient documentation

## 2019-07-01 DIAGNOSIS — R1310 Dysphagia, unspecified: Secondary | ICD-10-CM | POA: Insufficient documentation

## 2019-07-01 DIAGNOSIS — R590 Localized enlarged lymph nodes: Secondary | ICD-10-CM | POA: Insufficient documentation

## 2019-07-01 DIAGNOSIS — Z992 Dependence on renal dialysis: Secondary | ICD-10-CM | POA: Insufficient documentation

## 2019-07-01 DIAGNOSIS — I120 Hypertensive chronic kidney disease with stage 5 chronic kidney disease or end stage renal disease: Secondary | ICD-10-CM | POA: Insufficient documentation

## 2019-07-01 DIAGNOSIS — J029 Acute pharyngitis, unspecified: Secondary | ICD-10-CM | POA: Insufficient documentation

## 2019-07-01 DIAGNOSIS — Z79899 Other long term (current) drug therapy: Secondary | ICD-10-CM | POA: Insufficient documentation

## 2019-07-01 DIAGNOSIS — Z20828 Contact with and (suspected) exposure to other viral communicable diseases: Secondary | ICD-10-CM | POA: Insufficient documentation

## 2019-07-01 LAB — GROUP A STREP BY PCR: Group A Strep by PCR: NOT DETECTED

## 2019-07-01 NOTE — Discharge Instructions (Signed)
You were seen in the emergency department for 1 week of sore throats and neck pain when swallowing foods.  Your strep test was negative for strep throat.  Your x-ray did not show any obvious findings.  Your Covid test is pending and will result in the next day or 2.  You should continue Tylenol for pain and can do warm salt water gargles.  Will be important for you to follow-up with your primary care doctor for recheck of your symptoms.  Please return to the emergency department if any worsening of your symptoms.

## 2019-07-01 NOTE — ED Provider Notes (Signed)
[facility] EMERGENCY DEPARTMENT Provider Note   CSN: [id number] Arrival date & time: 07/01/19  2158     History   Chief Complaint Chief Complaint  Patient presents with  . Sore Throat    HPI Karla Garner is a 33 y.o. female.  She is complaining some pain in her throat and some difficulty swallowing food for the past week.  She feels that something to do with the muscles on the side of the neck.  She denies any fever.  She has had a little bit of a cough.  She is tried Tylenol for the symptoms without any improvement.  No trauma.  She does not feel like there is any foreign body in her throat.  She said she has had a stent in her blood vessel on that side and multiple procedures in her neck.  She has been getting dialysis from a right arm fistula.     The history is provided by the patient. The history is limited by a language barrier. A language interpreter was used (ipad).  Sore Throat This is a new problem. The current episode started more than 1 week ago. The problem occurs constantly. The problem has not changed since onset.Pertinent negatives include no chest pain, no abdominal pain, no headaches and no shortness of breath. The symptoms are aggravated by swallowing. Nothing relieves the symptoms. She has tried acetaminophen for the symptoms. The treatment provided mild relief.    Past Medical History:  Diagnosis Date  . Anemia   . Depression   . 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 Glomerulonephritis   . Hemodialysis patient ([place])    Tu, Th, Sat  . Hypertension   . PONV (postoperative nausea and vomiting)    was on dialysis i during pregnancy2014  . Sleep apnea   . Thrombocytopenia ([place]) 2014    Patient Active Problem List   Diagnosis Date Noted  . Menorrhagia with regular cycle 11/04/2018  . Dysuria 11/04/2018  . Anxiety state 06/05/2018  . Healthcare maintenance 11/21/2017  . 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 Depression 11/01/2017  . Swelling of right side of face 11/01/2017  . Acid reflux 11/01/2017  . ESRD on hemodialysis ([place])   . Normocytic anemia 08/02/2015  . Anemia of chronic kidney failure 04/09/2015  . Hypertension 04/30/2013  . 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 A/V FISTULAGRAM N/A 01/08/2018   Procedure: A/V FISTULAGRAM - right arm;  Surgeon: [name], MD;  Location: [place] [facility];  Service: Cardiovascular;  Laterality: N/A;  . AV FISTULA PLACEMENT Left   . AV FISTULA PLACEMENT Right 04/14/2015   Procedure: CREATION OF RIGHT RADIOCEPHALIC ARTERIOVENOUS (AV) FISTULA ;  Surgeon: Angelia Mould, MD;  Location: [place];  Service: Vascular;  Laterality: Right;  . AV FISTULA PLACEMENT Right 11/22/2016   Procedure: ARTERIOVENOUS (AV) FISTULA CREATION;  Surgeon: [name], MD;  Location: [place];  Service: Vascular;  Laterality: Right;  . CESAREAN SECTION     2009  . ESOPHAGOGASTRODUODENOSCOPY N/A 11/24/2016   Procedure: ESOPHAGOGASTRODUODENOSCOPY (EGD);  Surgeon: [name], MD;  Location: [facility] ENDOSCOPY;  Service: Endoscopy;  Laterality: N/A;  . EXCHANGE OF A DIALYSIS CATHETER Right 04/14/2015   Procedure: EXCHANGE OF RIGHT INTERNAL JUGULAR DIALYSIS CATHETER;  Surgeon: Angelia Mould, MD;  Location: [place];  Service: Vascular;  Laterality: Right;  . INSERTION OF DIALYSIS  CATHETER Left 11/22/2016   Procedure: INSERTION OF DIALYSIS CATHETER - LEFT INTERNAL JUGULAR PLACEMENT;  Surgeon: [name], MD;  Location: [place];  Service: Vascular;  Laterality: Left;  . IR DIALY SHUNT INTRO [place] W/IMG RIGHT Right 07/26/2017  . IR DIALY SHUNT INTRO NEEDLE/INTRACATH INITIAL W/IMG RIGHT Right 10/18/2017  . IR DIALY SHUNT INTRO NEEDLE/INTRACATH INITIAL W/IMG RIGHT Right 03/10/2018  . IR DIALY SHUNT INTRO NEEDLE/INTRACATH INITIAL W/IMG RIGHT Right 12/30/2018  . IR REMOVAL  TUN CV CATH W/O FL  03/06/2017  . IR TRANSCATH PLC STENT 1ST ART NOT LE CV CAR VERT CAR  12/30/2018  . IR US GUIDE VASC ACCESS RIGHT  12/30/2018  . IR US GUIDE VASC ACCESS RIGHT  12/30/2018  . IR VENO/EXT/UNI LEFT  12/30/2018  . LIGATION OF ARTERIOVENOUS  FISTULA Left 11/05/2014   Procedure: LIGATION OF LEFT ARM  BRACHIO-CEPHALIC ARTERIOVENOUS  FISTULA ,& REPAIR OF BRACHIAL ARTERY.;  Surgeon: Mal [name], MD;  Location: [place];  Service: Vascular;  Laterality: Left;  . PERIPHERAL VASCULAR BALLOON ANGIOPLASTY  01/08/2018   Procedure: PERIPHERAL VASCULAR BALLOON ANGIOPLASTY;  Surgeon: [name], MD;  Location: [place] [facility];  Service: Cardiovascular;;  innominate vein  . THROMBECTOMY W/ EMBOLECTOMY Right 11/16/2016   Procedure: THROMBECTOMY REVISION OF ARTERIOVENOUS FISTULA - RIGHT ARM;  Surgeon: [name], MD;  Location: [place];  Service: Vascular;  Laterality: Right;  . TUBAL LIGATION  2014     OB History    Gravida  3   Para  3   Term  1   Preterm  2   AB      Living  2     SAB      TAB      Ectopic      Multiple      Live Births  2            Home Medications    Prior to Admission medications   Medication Sig Start Date End Date Taking? Authorizing Provider  cephALEXin (KEFLEX) 250 MG capsule Take 1 capsule (250 mg total) by mouth every other day. Patient not taking: Reported on 12/03/2018 11/11/18   Steve Rattler, DO  ferric citrate (AURYXIA) 1 GM 210 MG(Fe) tablet Take 420 mg by mouth 3 (three) times daily with meals. 2 tabs with meals 3 times daily, and 1 tab with snacks 2 times daily    [provider]  megestrol (MEGACE) 40 MG tablet Take 1 tablet (40 mg total) by mouth 2 (two) times daily. Patient not taking: Reported on [id number] [id number]   [name], MD  traMADol (ULTRAM) 50 MG tablet Take 1 tablet (50 mg total) by mouth every 6 (six) hours as needed. 11/14/18   Fredia Sorrow, MD    Family History Family History   Problem Relation Age of Onset  . Other Mother        no medical problems per patient  . Other Father        no medical problems per patient  . Colon cancer Neg Hx   . Liver cancer Neg Hx   . Stomach cancer Neg Hx     Social History Social History   Tobacco Use  . Smoking status: Never Smoker  . Smokeless tobacco: Never Used  Substance Use Topics  . Alcohol use: No    Alcohol/week: 0.0 standard drinks  . Drug use: No     Allergies   No known allergies  Review of Systems Review of Systems  Constitutional: Negative for fever.  HENT: Positive for sore throat.   Eyes: Negative for visual disturbance.  Respiratory: Positive for cough. Negative for shortness of breath.   Cardiovascular: Negative for chest pain.  Gastrointestinal: Negative for abdominal pain.  Genitourinary: Negative for pelvic pain.  Musculoskeletal: Positive for neck pain.  Skin: Negative for rash.  Neurological: Negative for headaches.     Physical Exam Updated Vital Signs BP (!) 160/89 (BP Location: Left Arm)   Pulse 93   Temp 98.2 F (36.8 C) (Oral)   Resp 18   LMP 06/01/2019   SpO2 100%   Physical Exam Vitals signs and nursing note reviewed.  Constitutional:      General: She is not in acute distress.    Appearance: She is well-developed. She is not ill-appearing.  HENT:     Head: Normocephalic and atraumatic.     Right Ear: Tympanic membrane and ear canal normal.     Left Ear: Tympanic membrane and ear canal normal.     Mouth/Throat:     Mouth: Mucous membranes are moist.     Pharynx: Uvula midline. Posterior oropharyngeal erythema present. No pharyngeal swelling or oropharyngeal exudate.     Tonsils: No tonsillar exudate.  Eyes:     Conjunctiva/sclera: Conjunctivae normal.  Neck:     Musculoskeletal: Normal range of motion and neck supple.     Comments: No crepitus or overlying errythema Cardiovascular:     Rate and Rhythm: Normal rate and regular rhythm.     Heart sounds: No  murmur.  Pulmonary:     Effort: Pulmonary effort is normal. No respiratory distress.     Breath sounds: Normal breath sounds.  Abdominal:     Palpations: Abdomen is soft.     Tenderness: There is no abdominal tenderness.  Lymphadenopathy:     Cervical: Cervical adenopathy present.  Skin:    General: Skin is warm and dry.     Capillary Refill: Capillary refill takes less than 2 seconds.  Neurological:     General: No focal deficit present.     Mental Status: She is alert.      ED Treatments / Results  Labs (all labs ordered are listed, but only abnormal results are displayed) Labs Reviewed  GROUP A STREP BY PCR    EKG None  Radiology Dg Neck Soft Tissue  Result Date: 07/02/2019 CLINICAL DATA:  33 year old female with neck pain. EXAM: NECK SOFT TISSUES - 1+ VIEW COMPARISON:  Neck CTA dated 11/06/2017 FINDINGS: There is no evidence of retropharyngeal soft tissue swelling or epiglottic enlargement. The cervical airway is unremarkable and no radio-opaque foreign body identified. A vascular stent noted in the left upper mediastinum. IMPRESSION: Negative. Electronically Signed   By: Anner Crete M.D.   On: 07/02/2019 00:03    Procedures Procedures (including critical care time)  Medications Ordered in ED Medications - No data to display   Initial Impression / Assessment and Plan / ED Course  I have reviewed the triage vital signs and the nursing notes.  Pertinent labs & imaging results that were available during my care of the patient were reviewed by me and considered in my medical decision making (see chart for details).  Clinical Course as of Jul 02 951  Wed Jul 01, 2019  2346 Patient here with some sore throat and pain with swallowing.  She is a little bit erythema on pharyngeal exam.  No crepitus.  They be some subtle  neck swelling on the right but that is been documented in the past.  She has a prior stent inominate right side.  No feel any abnormal masses or  crepitus.  She does have some mild tender adenopathy.  No trismus.  Rapid strep is negative.  We will get an x-ray and a Covid test.   [MB]  Thu Jul 02, 2019  0000 Soft tissue neck x-ray reviewed by me.  Her vallecular space epiglottis looked normal.  Awaiting radiology reading.   [MB]    Clinical Course User Index [MB] Hayden Rasmussen, MD   [name] Records was evaluated in Emergency Department on 07/01/2019 for the symptoms described in the history of present illness. She was evaluated in the context of the global COVID-19 pandemic, which necessitated consideration that the patient might be at risk for infection with the SARS-CoV-2 virus that causes COVID-19. Institutional protocols and algorithms that pertain to the evaluation of patients at risk for COVID-19 are in a state of rapid change based on information released by regulatory bodies including the CDC and federal and state organizations. These policies and algorithms were followed during the patient's care in the ED.      Final Clinical Impressions(s) / ED Diagnoses   Final diagnoses:  Sore throat  Odynophagia    ED Discharge Orders    None       Hayden Rasmussen, MD 07/02/19 [phone]

## 2019-07-01 NOTE — ED Triage Notes (Signed)
Patient reports sore throat with right side swelling/dyspahgia onset last week , no fever or chills . Airway intact/respirations unlabored.

## 2019-07-02 ENCOUNTER — Other Ambulatory Visit (HOSPITAL_COMMUNITY): Payer: Self-pay | Admitting: Interventional Radiology

## 2019-07-02 ENCOUNTER — Telehealth (HOSPITAL_COMMUNITY): Payer: Self-pay

## 2019-07-02 ENCOUNTER — Encounter: Payer: Self-pay | Admitting: *Deleted

## 2019-07-02 ENCOUNTER — Ambulatory Visit
Admission: RE | Admit: 2019-07-02 | Discharge: 2019-07-02 | Disposition: A | Discharge status: Home / Self Care | Payer: Self-pay | Source: Ambulatory Visit | Attending: Interventional Radiology | Admitting: Interventional Radiology

## 2019-07-02 DIAGNOSIS — N186 End stage renal disease: Secondary | ICD-10-CM

## 2019-07-02 DIAGNOSIS — Z992 Dependence on renal dialysis: Secondary | ICD-10-CM

## 2019-07-02 DIAGNOSIS — I871 Compression of vein: Secondary | ICD-10-CM

## 2019-07-02 HISTORY — PX: IR RADIOLOGIST EVAL & MGMT: IMG5224

## 2019-07-02 NOTE — ED Notes (Signed)
Vernell Morgans (Husband) [phone]

## 2019-07-02 NOTE — Progress Notes (Signed)
Chief Complaint: Patient was seen in consultation today for central venous stenosis at the request of Scotti Motter  Referring Physician(s): Corrinna Karapetyan  History of Present Illness: Karla Garner is a 33 y.o. female with a history of end-stage renal disease on hemodialysis.  She is currently dialyzing via a right upper extremity arteriovenous fistula.  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  She was initially treated with angioplasty but developed early recurrent stenosis and symptoms.  Therefore, on 12/30/2018 I treated her at [facility] with placement of a 14 mm self-expanding nitinol stent.  Given her history of recurrent stenoses, she presents today for scheduled follow-up evaluation.  An interpreter is here to facilitate.  She reports that she experienced significant relief immediately following the procedure.  Her symptoms remained resolved for about a month to a month and a half.  However, after that she began to experience some mild venous distention again above her clavicle and along her anterior chest.  Additionally, she feels more of a pounding pulsation in her right upper extremity fistula.  She does note though that her facial swelling remains essentially resolved and that she is overall still better than she was prior to the procedure.  She is concerned that she is starting to experience the first signs of recurrent symptoms.  Her dialysis sessions have been going well and she is able to have a complete dialysis.    Past Medical History:  Diagnosis Date  . Anemia   . Depression   . 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 Glomerulonephritis   . Hemodialysis patient ([place])    Tu, Th, Sat   . Hypertension   . PONV (postoperative nausea and vomiting)    was on dialysis i during pregnancy2014  . Sleep apnea   . Thrombocytopenia ([place]) 2014    Past Surgical History:  Procedure Laterality Date  . A/V FISTULAGRAM N/A 01/08/2018   Procedure: A/V FISTULAGRAM - right arm;  Surgeon: [name], MD;  Location: [place] [facility];  Service: Cardiovascular;  Laterality: N/A;  . AV FISTULA PLACEMENT Left   . AV FISTULA PLACEMENT Right 04/14/2015   Procedure: CREATION OF RIGHT RADIOCEPHALIC ARTERIOVENOUS (AV) FISTULA ;  Surgeon: Angelia Mould, MD;  Location: [place];  Service: Vascular;  Laterality: Right;  . AV FISTULA PLACEMENT Right 11/22/2016   Procedure: ARTERIOVENOUS (AV) FISTULA CREATION;  Surgeon: [name], MD;  Location: [place];  Service: Vascular;  Laterality: Right;  . CESAREAN SECTION     2009  . ESOPHAGOGASTRODUODENOSCOPY N/A 11/24/2016   Procedure: ESOPHAGOGASTRODUODENOSCOPY (EGD);  Surgeon: [name], MD;  Location: [facility] ENDOSCOPY;  Service: Endoscopy;  Laterality: N/A;  . EXCHANGE OF A DIALYSIS CATHETER Right 04/14/2015   Procedure: EXCHANGE OF RIGHT INTERNAL JUGULAR DIALYSIS CATHETER;  Surgeon: Angelia Mould, MD;  Location: [place];  Service: Vascular;  Laterality: Right;  . INSERTION OF DIALYSIS CATHETER Left 11/22/2016   Procedure: INSERTION OF DIALYSIS CATHETER - LEFT INTERNAL JUGULAR PLACEMENT;  Surgeon: [name], MD;  Location: [place];  Service: Vascular;  Laterality: Left;  . IR DIALY SHUNT INTRO [place] W/IMG RIGHT Right 07/26/2017  . IR DIALY SHUNT INTRO NEEDLE/INTRACATH INITIAL W/IMG RIGHT Right 10/18/2017  . IR DIALY SHUNT INTRO NEEDLE/INTRACATH  INITIAL W/IMG RIGHT Right 03/10/2018  . IR DIALY SHUNT INTRO NEEDLE/INTRACATH INITIAL W/IMG RIGHT Right 12/30/2018  . IR RADIOLOGIST EVAL & MGMT  07/02/2019  . IR REMOVAL TUN CV CATH W/O FL  03/06/2017  . IR TRANSCATH PLC STENT 1ST ART NOT LE CV CAR VERT  CAR  12/30/2018  . IR US GUIDE VASC ACCESS RIGHT  12/30/2018  . IR US GUIDE VASC ACCESS RIGHT  12/30/2018  . IR VENO/EXT/UNI LEFT  12/30/2018  . LIGATION OF ARTERIOVENOUS  FISTULA Left 11/05/2014   Procedure: LIGATION OF LEFT ARM  BRACHIO-CEPHALIC ARTERIOVENOUS  FISTULA ,& REPAIR OF BRACHIAL ARTERY.;  Surgeon: Mal [name], MD;  Location: [place];  Service: Vascular;  Laterality: Left;  . PERIPHERAL VASCULAR BALLOON ANGIOPLASTY  01/08/2018   Procedure: PERIPHERAL VASCULAR BALLOON ANGIOPLASTY;  Surgeon: [name], MD;  Location: [place] [facility];  Service: Cardiovascular;;  innominate vein  . THROMBECTOMY W/ EMBOLECTOMY Right 11/16/2016   Procedure: THROMBECTOMY REVISION OF ARTERIOVENOUS FISTULA - RIGHT ARM;  Surgeon: [name], MD;  Location: [place];  Service: Vascular;  Laterality: Right;  . TUBAL LIGATION  2014    Allergies: No known allergies  Medications: Prior to Admission medications   Medication Sig Start Date End Date Taking? Authorizing Provider  cephALEXin (KEFLEX) 250 MG capsule Take 1 capsule (250 mg total) by mouth every other day. Patient not taking: Reported on 12/03/2018 11/11/18   Steve Rattler, DO  ferric citrate (AURYXIA) 1 GM 210 MG(Fe) tablet Take 420 mg by mouth 3 (three) times daily with meals. 2 tabs with meals 3 times daily, and 1 tab with snacks 2 times daily    [provider]  megestrol (MEGACE) 40 MG tablet Take 1 tablet (40 mg total) by mouth 2 (two) times daily. Patient not taking: Reported on [id number]   [name], MD  traMADol (ULTRAM) 50 MG tablet Take 1 tablet (50 mg total) by mouth every 6 (six) hours as needed. 11/14/18   Fredia Sorrow, MD     Family History  Problem Relation Age of Onset  . Other Mother        no medical problems per patient  . Other Father        no medical problems per patient  . Colon cancer Neg Hx   . Liver cancer Neg Hx   . Stomach cancer Neg Hx     Social History   Socioeconomic  History  . Marital status: Legally Separated    Spouse name: Not on file  . Number of children: 3  . Years of education: Not on file  . Highest education level: Not on file  Occupational History  . Occupation: unemployed  Social Needs  . Financial resource strain: Not hard at all  . Food insecurity    Worry: Never true    Inability: Never true  . Transportation needs    Medical: No    Non-medical: No  Tobacco Use  . Smoking status: Never Smoker  . Smokeless tobacco: Never Used  Substance and Sexual Activity  . Alcohol use: No    Alcohol/week: 0.0 standard drinks  . Drug use: No  . Sexual activity: Yes    Partners: Male  Lifestyle  . Physical activity    Days per week: 0 days    Minutes per session: Not on file  . Stress: Very much  Relationships  . Social connections    Talks on phone: Not on file  Gets together: Not on file    Attends religious service: Not on file    Active member of club or organization: Not on file    Attends meetings of clubs or organizations: Not on file    Relationship status: Not on file  Other Topics Concern  . Not on file  Social History Narrative   As of February 2018 she has 3 children with whom she lives they are aged 12, 58 and 2.     Review of Systems: A 12 point ROS discussed and pertinent positives are indicated in the HPI above.  All other systems are negative.  Review of Systems  Vital Signs: BP (!) 144/88 (BP Location: Left Arm)   Pulse 95   Temp 98.4 F (36.9 C)   SpO2 99%   Physical Exam Vitals signs reviewed.  Constitutional:      General: She is not in acute distress.    Appearance: Normal appearance.  HENT:     Head: Normocephalic and atraumatic.  Eyes:     General: No scleral icterus. Neck:     Vascular: JVD present.     Comments: Mild right JVD.  Cardiovascular:     Rate and Rhythm: Normal rate.     Arteriovenous access: right arteriovenous access is present.    Comments: RUE AVF.  Palpable thrill  distally near the anastomosis, becomes slightly more pulsatile in the upper arm.  Pulmonary:     Effort: Pulmonary effort is normal.  Abdominal:     General: Abdomen is flat.  Skin:    General: Skin is warm and dry.  Neurological:     Mental Status: She is alert and oriented to person, place, and time.  Psychiatric:        Mood and Affect: Mood normal.        Behavior: Behavior normal.      Imaging: Dg Neck Soft Tissue  Result Date: 07/02/2019 CLINICAL DATA:  33 year old female with neck pain. EXAM: NECK SOFT TISSUES - 1+ VIEW COMPARISON:  Neck CTA dated 11/06/2017 FINDINGS: There is no evidence of retropharyngeal soft tissue swelling or epiglottic enlargement. The cervical airway is unremarkable and no radio-opaque foreign body identified. A vascular stent noted in the left upper mediastinum. IMPRESSION: Negative. Electronically Signed   By: Anner Crete M.D.   On: 07/02/2019 00:03   Ir Radiologist Eval & Mgmt  Result Date: 07/02/2019 Please refer to notes tab for details about interventional procedure. (Op Note)   Labs:  CBC: Recent Labs    11/04/18 1707 11/13/18 1744 12/03/18 1655 12/30/18 0953  WBC 7.5 6.8 6.0 5.5  HGB 11.1 10.1* 12.5 11.3*  HCT 33.9* 32.0* 36.6 35.7*  PLT 186 182 282 188    COAGS: Recent Labs    12/30/18 0953  INR 1.0  APTT 29    BMP: Recent Labs    11/13/18 1744 12/30/18 0953  NA 135 135  K 4.8 5.5*  CL 95* 94*  CO2 24 26  GLUCOSE 101* 89  BUN 60* 47*  CALCIUM 9.5 10.7*  CREATININE 10.44* 8.36*  GFRNONAA 4* 6*  GFRAA 5* 7*    LIVER FUNCTION TESTS: No results for input(s): BILITOT, AST, ALT, ALKPHOS, PROT, ALBUMIN in the last 8760 hours.  TUMOR MARKERS: No results for input(s): AFPTM, CEA, CA199, CHROMGRNA in the last 8760 hours.  Assessment and Plan:  Significant improvement following stenting of critical right brachiocephalic venous stenosis.  Overall, her symptoms remain improved but she is developing some  recurrent symptoms of venous distention and increased pulsatility in her right upper extremity fistula.  This is concerning for either development of recurrent stenosis, or a new stenosis.  Her recurrent symptoms and physical exam findings warrant further evaluation with fistulogram.  1.)  Schedule for fistulogram to be performed at [facility] by me.   Electronically Signed: Jacqulynn Cadet 07/02/2019, 10:12 AM   I spent a total of  25 Minutes in face to face in clinical consultation, greater than 50% of which was counseling/coordinating care for central venous stenosis.

## 2019-07-02 NOTE — Telephone Encounter (Signed)
Called to schedule fistulogram, no answer, left vm w/interpreter. AW

## 2019-07-03 LAB — NOVEL CORONAVIRUS, NAA (HOSP ORDER, SEND-OUT TO REF LAB; TAT 18-24 HRS): SARS-CoV-2, NAA: NOT DETECTED

## 2019-08-14 ENCOUNTER — Other Ambulatory Visit: Payer: Self-pay

## 2019-08-14 DIAGNOSIS — N186 End stage renal disease: Secondary | ICD-10-CM

## 2019-08-14 DIAGNOSIS — Z992 Dependence on renal dialysis: Secondary | ICD-10-CM

## 2019-08-18 ENCOUNTER — Encounter (HOSPITAL_COMMUNITY): Payer: Self-pay

## 2019-08-18 ENCOUNTER — Ambulatory Visit: Payer: Self-pay | Admitting: Vascular Surgery

## 2019-08-18 ENCOUNTER — Ambulatory Visit (HOSPITAL_COMMUNITY): Payer: Self-pay

## 2019-08-18 ENCOUNTER — Other Ambulatory Visit: Payer: Self-pay

## 2019-08-18 ENCOUNTER — Ambulatory Visit (INDEPENDENT_AMBULATORY_CARE_PROVIDER_SITE_OTHER): Payer: Self-pay | Admitting: Physician Assistant

## 2019-08-18 VITALS — BP 146/91 | HR 90 | Temp 97.9°F | Resp 14 | Ht 63.0 in | Wt 89.0 lb

## 2019-08-18 DIAGNOSIS — Z992 Dependence on renal dialysis: Secondary | ICD-10-CM

## 2019-08-18 DIAGNOSIS — N186 End stage renal disease: Secondary | ICD-10-CM

## 2019-08-18 NOTE — Progress Notes (Signed)
    Established Dialysis Access   History of Present Illness   Karla Garner is a 33 y.o. 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  Fistula was created by Dr. Donzetta Matters in February 2018.  She subsequently underwent venogram with revascularization of innominate vein due to short segment occlusion by Dr. Donzetta Matters in March 2019.  She was referred back to our office to evaluate skin changes, and stick sites of AV fistula.  She denies any bleeding episodes, scabbing, or ulceration formation.  She denies any difficulty completing treatments and is dialyzing on a Monday Wednesday Friday schedule without complication.  She has used all of her access options in her left arm.  Spanish speaking interpreter used for today's visit.  The patient's PMH, PSH, SH, and FamHx were reviewed on and are unchanged from prior visit.  Current Outpatient Medications  Medication Sig Dispense Refill  . ferric citrate (AURYXIA) 1 GM 210 MG(Fe) tablet Take 420 mg by mouth 3 (three) times daily with meals. 2 tabs with meals 3 times daily, and 1 tab with snacks 2 times daily    . megestrol (MEGACE) 40 MG tablet Take 1 tablet (40 mg total) by mouth 2 (two) times daily. 60 tablet 0   Current Facility-Administered Medications  Medication Dose Route Frequency Provider Last Rate Last Dose  . medroxyPROGESTERone (DEPO-PROVERA) injection 150 mg  150 mg Intramuscular Q90 days Constant, Peggy, MD   150 mg at 12/03/18 1648    On ROS today: 10 system ROS is negative unless otherwise noted in HPI   Physical Examination   Vitals:   08/18/19 1346  BP: (!) 146/91  Pulse: 90  Resp: 14  Temp: 97.9 F (36.6 C)  TempSrc: Temporal  SpO2: 97%  Weight: 89 lb (40.4 kg)  Height: 5\' 3"  (1.6 m)   Body mass index is 15.77 kg/m.  General Alert, O x 3, WD, NAD  Pulmonary Sym exp, good B air movt, CTA B  Cardiac RRR, Nl S1, S2  Vascular Vessel Right Left  Radial Faintly palpable Palpable   Brachial Palpable Palpable  Ulnar Not palpable Not palpable    Musculo- skeletal  palpable thrill right arm fistula; skin changes at 2 common stick sites however no scabbing, ulceration, or impending rupture, mobile skin overlying fistula in both areas  Neurologic A&O; CN grossly intact      Medical Decision Making   Karla Garner is a 33 y.o. female who presents with ESRD requiring hemodialysis.    Patent right arm brachiocephalic fistula with palpable thrill  Some skin changes in 2 areas of repeated cannulation however no ulcer formation or any other sign that would indicate impending rupture  I explained to the patient that plication surgery is reserved for when fistula is at risk of spontaneous bleed from areas of repeated cannulation.  Especially since patient has used up all access options in her left arm, we will avoid any further surgery on her current fistula unless necessary  Patient agreeable to the above and will follow up on a as needed basis   Dagoberto Ligas PA-C [facility] Office: [phone]  Clinic MD: Dr. Carlis Abbott

## 2020-02-09 ENCOUNTER — Other Ambulatory Visit (HOSPITAL_COMMUNITY): Payer: Self-pay | Admitting: Nephrology

## 2020-02-09 DIAGNOSIS — I871 Compression of vein: Secondary | ICD-10-CM

## 2020-02-09 DIAGNOSIS — M79601 Pain in right arm: Secondary | ICD-10-CM

## 2020-02-10 ENCOUNTER — Telehealth (HOSPITAL_COMMUNITY): Payer: Self-pay

## 2020-02-11 ENCOUNTER — Other Ambulatory Visit: Payer: Self-pay | Admitting: Radiology

## 2020-02-12 ENCOUNTER — Other Ambulatory Visit: Payer: Self-pay | Admitting: Radiology

## 2020-02-15 ENCOUNTER — Other Ambulatory Visit (HOSPITAL_COMMUNITY): Payer: Self-pay | Admitting: Nephrology

## 2020-02-15 ENCOUNTER — Other Ambulatory Visit: Payer: Self-pay

## 2020-02-15 ENCOUNTER — Ambulatory Visit (HOSPITAL_COMMUNITY)
Admission: RE | Admit: 2020-02-15 | Discharge: 2020-02-15 | Disposition: A | Discharge status: Home / Self Care | Payer: Self-pay | Source: Ambulatory Visit | Attending: Nephrology | Admitting: Nephrology

## 2020-02-15 DIAGNOSIS — N186 End stage renal disease: Secondary | ICD-10-CM | POA: Insufficient documentation

## 2020-02-15 DIAGNOSIS — D649 Anemia, unspecified: Secondary | ICD-10-CM | POA: Insufficient documentation

## 2020-02-15 DIAGNOSIS — Z992 Dependence on renal dialysis: Secondary | ICD-10-CM | POA: Insufficient documentation

## 2020-02-15 DIAGNOSIS — I871 Compression of vein: Secondary | ICD-10-CM

## 2020-02-15 DIAGNOSIS — G4730 Sleep apnea, unspecified: Secondary | ICD-10-CM | POA: Insufficient documentation

## 2020-02-15 DIAGNOSIS — F329 Major depressive disorder, single episode, unspecified: Secondary | ICD-10-CM | POA: Insufficient documentation

## 2020-02-15 DIAGNOSIS — M79601 Pain in right arm: Secondary | ICD-10-CM

## 2020-02-15 DIAGNOSIS — T82858A Stenosis of vascular prosthetic devices, implants and grafts, initial encounter: Secondary | ICD-10-CM | POA: Insufficient documentation

## 2020-02-15 DIAGNOSIS — Y841 Kidney dialysis as the cause of abnormal reaction of the patient, or of later complication, without mention of misadventure at the time of the procedure: Secondary | ICD-10-CM | POA: Insufficient documentation

## 2020-02-15 DIAGNOSIS — I120 Hypertensive chronic kidney disease with stage 5 chronic kidney disease or end stage renal disease: Secondary | ICD-10-CM | POA: Insufficient documentation

## 2020-02-15 HISTORY — PX: IR US GUIDE VASC ACCESS RIGHT: IMG2390

## 2020-02-15 HISTORY — PX: IR AV DIALY SHUNT INTRO NEEDLE/INTRAC INITIAL W/PTA/STENT/IMG RT: IMG6117

## 2020-02-15 LAB — BASIC METABOLIC PANEL
Anion gap: 18 — ABNORMAL HIGH (ref 5–15)
BUN: 83 mg/dL — ABNORMAL HIGH (ref 6–20)
CO2: 27 mmol/L (ref 22–32)
Calcium: 10 mg/dL (ref 8.9–10.3)
Chloride: 92 mmol/L — ABNORMAL LOW (ref 98–111)
Creatinine, Ser: 12.53 mg/dL — ABNORMAL HIGH (ref 0.44–1.00)
GFR calc Af Amer: 4 mL/min — ABNORMAL LOW (ref >60)
GFR calc non Af Amer: 4 mL/min — ABNORMAL LOW (ref >60)
Glucose, Bld: 87 mg/dL (ref 70–99)
Potassium: 4.8 mmol/L (ref 3.5–5.1)
Sodium: 137 mmol/L (ref 135–145)

## 2020-02-15 MED ORDER — SODIUM CHLORIDE 0.9 % IV SOLN: INTRAVENOUS | Status: DC

## 2020-02-15 MED ORDER — LIDOCAINE HCL 1 % IJ SOLN
INTRAMUSCULAR | Status: AC
  Filled 2020-02-15: qty 20

## 2020-02-15 MED ORDER — FENTANYL CITRATE (PF) 100 MCG/2ML IJ SOLN
INTRAMUSCULAR | Status: AC
  Filled 2020-02-15: qty 2

## 2020-02-15 MED ORDER — MIDAZOLAM HCL 2 MG/2ML IJ SOLN
INTRAMUSCULAR | Status: AC | PRN
  Administered 2020-02-15 (×2): 1 mg via INTRAVENOUS

## 2020-02-15 MED ORDER — FENTANYL CITRATE (PF) 100 MCG/2ML IJ SOLN
INTRAMUSCULAR | Status: AC | PRN
  Administered 2020-02-15 (×2): 50 ug via INTRAVENOUS

## 2020-02-15 MED ORDER — IOHEXOL 300 MG/ML  SOLN
50.0000 mL | Freq: Once | INTRAMUSCULAR | Status: AC | PRN
  Administered 2020-02-15: 15 mL via INTRAVENOUS

## 2020-02-15 MED ORDER — IOHEXOL 300 MG/ML  SOLN
50.0000 mL | Freq: Once | INTRAMUSCULAR | Status: AC | PRN
  Administered 2020-02-15: 50 mL via INTRAVENOUS

## 2020-02-15 MED ORDER — MIDAZOLAM HCL 2 MG/2ML IJ SOLN
INTRAMUSCULAR | Status: AC
  Filled 2020-02-15: qty 2

## 2020-02-15 MED ORDER — IOHEXOL 300 MG/ML  SOLN
50.0000 mL | Freq: Once | INTRAMUSCULAR | Status: AC | PRN
  Administered 2020-02-15: 15 mL via INTRA_ARTERIAL

## 2020-02-15 NOTE — Discharge Instructions (Signed)
Fistulografa de dilisis, cuidados posteriores Dialysis Fistulogram, Care After Esta hoja le proporciona informacin sobre cmo cuidarse despus del procedimiento. Su mdico tambin podr darle instrucciones ms especficas. Comunquese con su mdico si tiene problemas o preguntas. Qu puedo esperar despus del procedimiento? Despus del procedimiento, es comn [facility] siguientes sntomas:  Una pequea molestia en la zona en la que se coloc el tubo delgado y pequeo (catter) para el procedimiento.  Un pequeo hematoma alrededor de la fstula.  Somnolencia y cansancio Company secretary). Siga estas indicaciones en su casa: Actividad   Haga reposo en su casa y no levante objetos que pesen ms de 5 lb (2,3 kg) el da despus del procedimiento.  Reanude sus actividades normales segn lo indicado por el mdico. Pregntele al mdico qu actividades son seguras para usted.  No conduzca ni use maquinaria pesada mientras toma analgsicos recetados.  No conduzca durante 24horas si recibi un medicamento para ayudarlo a relajarse (sedante) durante el procedimiento. Medicamentos   [employer] de venta libre y los recetados solamente como se lo haya indicado el mdico. Cuidado del Environmental consultant de la puncin  Siga las indicaciones de su mdico acerca de cmo Government social research officer donde se insertaron los catteres. Asegrese de hacer lo siguiente: ? Lvese las manos con agua y jabn antes de Quarry manager las vendas (vendaje). Use desinfectante para manos si no dispone de [place]. ? Cambie el vendaje como se lo haya indicado el mdico. ? No retire los puntos (suturas), la goma para cerrar la piel o las tiras [place]. Es posible que estos cierres cutneos Animal nutritionist en la piel durante 2semanas o ms. Si los bordes de las tiras adhesivas empiezan a despegarse y Therapist, sports, puede recortar los que estn sueltos. No retire las tiras [facility] por completo a menos que el mdico se lo indique.  Controle  la zona de la puncin todos los das para detectar signos de infeccin. Est atento a los siguientes signos: ? Dolor, hinchazn o enrojecimiento. ? Lquido o sangre. ? Calor. ? Pus o mal olor. Instrucciones generales  No tome baos de inmersin, no nade ni use el jacuzzi hasta que el mdico lo autorice. Pregntele al mdico si puede ducharse. Thurston Pounds solo le permitan darse baos de [place].  Controle atentamente la fstula de dilisis. Verifique para asegurarse de que puede sentir una vibracin o un zumbido (frmito) cuando Risk manager los dedos sobre la fstula.  Evite daar el injerto o la fstula: ? No use ropa ajustada ni joyas en el brazo o la pierna que tiene el injerto o la fstula. ? Informe a todos sus mdicos que tiene un injerto o una fstula para dilisis. ? No permita extracciones de sangre, terapias intravenosas ni lecturas de la presin arterial en el brazo que tiene la fstula o el injerto. ? No permita que le apliquen vacunas antigripales ni otras vacunas en el brazo con la fstula o el injerto.  Concurra a todas las visitas de seguimiento como se lo haya indicado el mdico. Esto es importante. Comunquese con un mdico si:  Tiene enrojecimiento, hinchazn o dolor en el lugar donde se insert el catter.  Observa lquido o sangre que proviene del lugar de la insercin del catter.  El lugar de la insercin del catter est caliente al tacto.  Tiene pus o percibe mal olor que proviene del lugar de la insercin del catter.  Tiene fiebre o siente escalofros. Solicite ayuda de inmediato si:  Se siente dbil.  Tiene problemas de equilibrio.  Tiene dificultad para mover los brazos o las piernas.  Tiene problemas visuales o para hablar.  Ya no puede sentir una vibracin o un zumbido cuando [employer] dedos sobre la fstula de dilisis.  La extremidad que se [place] para el procedimiento: ? Se hincha. ? Duele. ? Est fra. ? Cambia de color, por ejemplo, se torna  azulada o blanco plido.  Siente falta de aire o Tourist information centre manager. Resumen  Despus de [place] fistulografa de dilisis, es comn tener una pequea molestia o algunos moretones en la zona donde se coloc el tubo delgado y pequeo (catter).  Descanse en su [place] despus del procedimiento. Reanude sus actividades normales segn lo indicado por el mdico.  [employer] de venta libre y los recetados solamente como se lo haya indicado el mdico.  Siga las indicaciones de su mdico acerca de cmo Government social research officer donde se insert el catter.  Concurra a todas las visitas de seguimiento como se lo haya indicado el mdico. Esta informacin no tiene Marine scientist el consejo del mdico. Asegrese de hacerle al mdico cualquier pregunta que tenga. Document Revised: 12/11/2017 Document Reviewed: 12/11/2017 Elsevier Patient Education  2020 [employer].

## 2020-02-15 NOTE — Progress Notes (Signed)
Discharge instructions given to patient and translator. Both verbalized understanding.

## 2020-02-15 NOTE — H&P (Signed)
Chief Complaint: Patient was seen in consultation today for fistulagram with possible angioplasty, possible stent placement.  Referring Physician(s): Coladonato,Joseph  Supervising Physician: Dr. Pascal Lux  Patient Status: [facility] - Out-pt  History of Present Illness: Karla Garner is a 34 y.o. female with a past medical history significant for depression, anemia, HTN, glomerulonephritis and ESRD on HD via right AVF who presents today for a fistulagram with possible intervention. Patient is well known to IR due to previous fistulagrams and venograms - most recently 12/2018. 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   She continues to have RUE swelling as well as neck face swelling. Her AF remains functional for HD but she is referred for fistulogram/central venogram PMHx, meds, labs, imaging, allergies reviewed. Feels well, no recent fevers, chills, illness. Has been NPO today as directed. Interpreter at bedside   Past Medical History:  Diagnosis Date  . Anemia   . Depression   . 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 Glomerulonephritis   . Hemodialysis patient ([place])    Tu, Th, Sat  . Hypertension   . PONV (postoperative nausea and vomiting)    was on dialysis i during pregnancy2014  . Sleep apnea   . Thrombocytopenia ([place]) 2014    Past Surgical History:  Procedure Laterality Date  . A/V FISTULAGRAM N/A 01/08/2018   Procedure: A/V FISTULAGRAM - right arm;  Surgeon: [name], MD;  Location: [place] [facility];  Service: Cardiovascular;  Laterality: N/A;  . AV FISTULA PLACEMENT Left   . AV FISTULA PLACEMENT Right 04/14/2015   Procedure: CREATION OF RIGHT RADIOCEPHALIC ARTERIOVENOUS (AV) FISTULA ;  Surgeon: Angelia Mould, MD;  Location: [place];  Service: Vascular;  Laterality: Right;  . AV FISTULA PLACEMENT Right 11/22/2016   Procedure: ARTERIOVENOUS (AV) FISTULA CREATION;  Surgeon: [name], MD;  Location: [place];  Service: Vascular;  Laterality: Right;  . CESAREAN SECTION     2009  . ESOPHAGOGASTRODUODENOSCOPY N/A 11/24/2016   Procedure: ESOPHAGOGASTRODUODENOSCOPY (EGD);  Surgeon: [name], MD;  [facility];  Service: Endoscopy;  Laterality: N/A;  . EXCHANGE OF A DIALYSIS CATHETER Right 04/14/2015   Procedure: EXCHANGE OF RIGHT INTERNAL JUGULAR DIALYSIS CATHETER;  Surgeon: Angelia Mould, MD;  Location: [place];  Service: Vascular;  Laterality: Right;  . INSERTION OF DIALYSIS CATHETER Left 11/22/2016   Procedure: INSERTION OF DIALYSIS CATHETER - LEFT INTERNAL JUGULAR PLACEMENT;  Surgeon: [name], MD;  Location: [place];  Service: Vascular;  Laterality: Left;  . IR DIALY SHUNT INTRO [place] W/IMG RIGHT Right 07/26/2017  . IR DIALY SHUNT INTRO NEEDLE/INTRACATH INITIAL W/IMG RIGHT Right 10/18/2017  . IR DIALY SHUNT INTRO NEEDLE/INTRACATH INITIAL W/IMG RIGHT Right 03/10/2018  . IR DIALY SHUNT INTRO NEEDLE/INTRACATH INITIAL W/IMG RIGHT Right 12/30/2018  . IR RADIOLOGIST EVAL & MGMT  07/02/2019  . IR REMOVAL TUN CV CATH W/O FL  03/06/2017  . IR TRANSCATH PLC STENT 1ST ART NOT LE CV CAR VERT CAR  12/30/2018  . IR US GUIDE VASC ACCESS RIGHT  12/30/2018  . IR US GUIDE VASC ACCESS RIGHT  12/30/2018  . IR VENO/EXT/UNI LEFT  12/30/2018  . LIGATION OF ARTERIOVENOUS  FISTULA Left 11/05/2014   Procedure: LIGATION OF LEFT ARM  BRACHIO-CEPHALIC ARTERIOVENOUS  FISTULA ,& REPAIR OF BRACHIAL ARTERY.;  Surgeon: Mal [name], MD;  Location: [place];  Service: Vascular;  Laterality: Left;  . PERIPHERAL VASCULAR BALLOON ANGIOPLASTY  01/08/2018   Procedure: PERIPHERAL VASCULAR BALLOON ANGIOPLASTY;  Surgeon: [name], MD;  Location: [place] [facility];  Service: Cardiovascular;;  innominate vein  . THROMBECTOMY W/ EMBOLECTOMY Right 11/16/2016   Procedure: THROMBECTOMY  REVISION OF ARTERIOVENOUS FISTULA - RIGHT ARM;  Surgeon: [name], MD;  Location: [place];  Service: Vascular;  Laterality: Right;  . TUBAL LIGATION  2014    Allergies: Patient has no known allergies.  Medications: Prior to Admission medications   Medication Sig Start Date End Date Taking? Authorizing Provider  ferric citrate (AURYXIA) 1 GM 210 MG(Fe) tablet Take 420 mg by mouth 3 (three) times daily with meals. 2 tabs with meals 3 times daily, and 1 tab with snacks 2 times daily   Yes [provider]  traMADol (ULTRAM) 50 MG tablet Take 1 tablet (50 mg total) by mouth every 6 (six) hours as needed. 11/14/18  Yes Fredia Sorrow, MD  cephALEXin (KEFLEX) 250 MG capsule Take 1 capsule (250 mg total) by mouth every other day. Patient not taking: Reported on 12/03/2018 11/11/18   Steve Rattler, DO  megestrol (MEGACE) 40 MG tablet Take 1 tablet (40 mg total) by mouth 2 (two) times daily. Patient not taking: Reported on 04/06/736 10/27/24   [name], MD     Family History  Problem Relation Age of Onset  . Other Mother        no medical problems per patient  . Other Father        no medical problems per patient  . Colon cancer Neg Hx   . Liver cancer Neg Hx   . Stomach cancer Neg Hx     Social History   Socioeconomic History  . Marital status: Legally Separated    Spouse name: Not on file  . Number of children: 3  . Years of education: Not on file  . Highest education level: Not on file  Occupational History  . Occupation: unemployed  Tobacco Use  . Smoking status: Never Smoker  . Smokeless tobacco: Never Used  Substance and Sexual Activity  . Alcohol use: No    Alcohol/week: 0.0 standard drinks  . Drug use: No  . Sexual activity: Yes    Partners: Male  Other Topics Concern  . Not on file  Social History Narrative   As of February 2018 she has 3 children with whom she lives they are aged 59, 70 and 2.    Social Determinants of Health   Financial Resource  Strain:   . Difficulty of Paying Living Expenses:   Food Insecurity:   . Worried About Charity fundraiser in the Last Year:   . Arboriculturist in the Last Year:   Transportation Needs:   . Film/video editor (Medical):   Marland Kitchen Lack of Transportation (Non-Medical):   Physical Activity:   . Days of Exercise per Week:   . Minutes of Exercise per Session:   Stress:   . Feeling of Stress :   Social Connections:   . Frequency of Communication with Friends and Family:   . Frequency of Social Gatherings with Friends and Family:   . Attends Religious Services:   . Active Member of Clubs or Organizations:   . Attends Archivist Meetings:   Marland Kitchen Marital Status:      Review of Systems: A 12 point ROS discussed and pertinent positives are indicated in the  HPI above.  All other systems are negative.  Review of Systems  Constitutional: Negative for chills and fever.  Respiratory: Negative for cough and shortness of breath.   Cardiovascular: Negative for chest pain.  Gastrointestinal: Negative for abdominal pain, diarrhea, nausea and vomiting.  Musculoskeletal: Negative for back pain.  Skin: Negative for color change.  Neurological: Negative for dizziness, syncope and headaches.    Vital Signs: BP (!) 186/104   Pulse 80   Temp 98 F (36.7 C) (Oral)   Resp 16   Ht 5\' 3"  (1.6 m)   Wt 43.1 kg   SpO2 98%   BMI 16.83 kg/m   Physical Exam Vitals reviewed.  Constitutional:      General: She is not in acute distress.    Comments: Interpreter present during discussion and exam. Family member also at bedside.  HENT:     Head: Normocephalic.  Cardiovascular:     Rate and Rhythm: Normal rate and regular rhythm.     Comments: (+) right AVF (+) thrill, (+) bruit. No erythema, edema, bruising or open wounds to right upper extremity over area of fistula. Pulmonary:     Effort: Pulmonary effort is normal.     Breath sounds: Normal breath sounds.  Abdominal:     General: There is  no distension.     Palpations: Abdomen is soft.     Tenderness: There is no abdominal tenderness.  Skin:    General: Skin is warm and dry.  Neurological:     Mental Status: She is alert and oriented to person, place, and time.  Psychiatric:        Mood and Affect: Mood normal.        Behavior: Behavior normal.        Thought Content: Thought content normal.        Judgment: Judgment normal.      MD Evaluation Airway: WNL Heart: WNL Abdomen: WNL Chest/ Lungs: WNL ASA  Classification: 3 Mallampati/Airway Score: Two   Imaging: No results found.  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 Laurence Ferrari it has been noted several times over the years that she has a chronic right brachiocephalic vein occlusion, previously treated with angioplasty and stent, most recently in 12/2018 Plan for repeat fistulogram with possible intervention again today.  Risks and benefits discussed with the patient including, but not limited to bleeding, infection, vascular injury, pulmonary embolism, need for tunneled HD catheter placement or even death.  All of the patient's questions were answered, patient is agreeable to proceed.  Consent signed and in chart.  Thank you for this interesting consult.  I greatly enjoyed meeting Adore Kithcart and look forward to participating in their care.  A copy of this report was sent to the requesting provider on this date.  Electronically Signed: Ascencion Dike, PA-C 02/15/2020, 8:44 AM   I spent a total of   25 Minutes in face to face in clinical consultation, greater than 50% of which was counseling/coordinating care for fistulagram with possible intervention.

## 2020-02-15 NOTE — Sedation Documentation (Signed)
BP cuff malfunction

## 2020-02-15 NOTE — Procedures (Signed)
Pre Procedure Dx: ESRD; Right sided facial and upper extremity swelling Post Procedure Dx: Same  Technically successful fistulogram with angioplasty and stent placement in recurrent stenosis/near occlusion at the level of the junction of the right clavicle and 1st rib.     EBL: Minimal No immediate complications.  Ronny Bacon, MD Pager #: [phone]

## 2020-02-19 ENCOUNTER — Other Ambulatory Visit (HOSPITAL_COMMUNITY): Payer: Self-pay | Admitting: Nephrology

## 2020-02-19 ENCOUNTER — Encounter (HOSPITAL_COMMUNITY): Payer: Self-pay

## 2020-02-19 DIAGNOSIS — M79601 Pain in right arm: Secondary | ICD-10-CM

## 2020-02-19 DIAGNOSIS — I871 Compression of vein: Secondary | ICD-10-CM

## 2020-06-15 DIAGNOSIS — T7840XA Allergy, unspecified, initial encounter: Secondary | ICD-10-CM | POA: Insufficient documentation

## 2020-06-15 DIAGNOSIS — T782XXA Anaphylactic shock, unspecified, initial encounter: Secondary | ICD-10-CM | POA: Insufficient documentation

## 2020-06-20 DIAGNOSIS — R739 Hyperglycemia, unspecified: Secondary | ICD-10-CM | POA: Insufficient documentation

## 2020-07-27 ENCOUNTER — Ambulatory Visit: Payer: Self-pay | Admitting: Internal Medicine

## 2020-08-01 DIAGNOSIS — R5383 Other fatigue: Secondary | ICD-10-CM | POA: Insufficient documentation

## 2020-10-12 ENCOUNTER — Other Ambulatory Visit (HOSPITAL_COMMUNITY): Payer: Self-pay | Admitting: Nephrology

## 2020-10-12 DIAGNOSIS — I871 Compression of vein: Secondary | ICD-10-CM

## 2020-10-17 ENCOUNTER — Other Ambulatory Visit: Payer: Self-pay | Admitting: Student

## 2020-10-18 ENCOUNTER — Other Ambulatory Visit (HOSPITAL_COMMUNITY): Payer: Self-pay | Admitting: Nephrology

## 2020-10-18 ENCOUNTER — Ambulatory Visit (HOSPITAL_COMMUNITY)
Admission: RE | Admit: 2020-10-18 | Discharge: 2020-10-18 | Disposition: A | Discharge status: Home / Self Care | Payer: Self-pay | Source: Ambulatory Visit | Attending: Nephrology | Admitting: Nephrology

## 2020-10-18 ENCOUNTER — Other Ambulatory Visit: Payer: Self-pay

## 2020-10-18 DIAGNOSIS — I871 Compression of vein: Secondary | ICD-10-CM

## 2020-10-18 DIAGNOSIS — Z992 Dependence on renal dialysis: Secondary | ICD-10-CM | POA: Insufficient documentation

## 2020-10-18 DIAGNOSIS — T82858A Stenosis of vascular prosthetic devices, implants and grafts, initial encounter: Secondary | ICD-10-CM | POA: Insufficient documentation

## 2020-10-18 DIAGNOSIS — I120 Hypertensive chronic kidney disease with stage 5 chronic kidney disease or end stage renal disease: Secondary | ICD-10-CM | POA: Insufficient documentation

## 2020-10-18 DIAGNOSIS — Y841 Kidney dialysis as the cause of abnormal reaction of the patient, or of later complication, without mention of misadventure at the time of the procedure: Secondary | ICD-10-CM | POA: Insufficient documentation

## 2020-10-18 DIAGNOSIS — N186 End stage renal disease: Secondary | ICD-10-CM | POA: Insufficient documentation

## 2020-10-18 HISTORY — PX: IR PTA ADDL CENTRAL DIALYSIS SEG THRU DIALY CIRCUIT LEFT: IMG6108

## 2020-10-18 HISTORY — PX: IR AV DIALY SHUNT INTRO NEEDLE/INTRACATH INITIAL W/PTA/IMG LEFT: IMG6103

## 2020-10-18 LAB — BASIC METABOLIC PANEL
Anion gap: 14 (ref 5–15)
BUN: 48 mg/dL — ABNORMAL HIGH (ref 6–20)
CO2: 30 mmol/L (ref 22–32)
Calcium: 10.2 mg/dL (ref 8.9–10.3)
Chloride: 93 mmol/L — ABNORMAL LOW (ref 98–111)
Creatinine, Ser: 9.61 mg/dL — ABNORMAL HIGH (ref 0.44–1.00)
GFR, Estimated: 5 mL/min — ABNORMAL LOW (ref >60)
Glucose, Bld: 87 mg/dL (ref 70–99)
Potassium: 5.2 mmol/L — ABNORMAL HIGH (ref 3.5–5.1)
Sodium: 137 mmol/L (ref 135–145)

## 2020-10-18 LAB — HCG, SERUM, QUALITATIVE: Preg, Serum: NEGATIVE

## 2020-10-18 MED ORDER — FENTANYL CITRATE (PF) 100 MCG/2ML IJ SOLN
INTRAMUSCULAR | Status: AC
  Filled 2020-10-18: qty 2

## 2020-10-18 MED ORDER — SODIUM CHLORIDE 0.9 % IV SOLN: INTRAVENOUS | Status: DC

## 2020-10-18 MED ORDER — HEPARIN SODIUM (PORCINE) 1000 UNIT/ML IJ SOLN
INTRAMUSCULAR | Status: AC
  Administered 2020-10-18: 10:00:00 5000 [IU] via INTRAVENOUS
  Filled 2020-10-18: qty 1

## 2020-10-18 MED ORDER — MIDAZOLAM HCL 2 MG/2ML IJ SOLN
INTRAMUSCULAR | Status: AC
  Filled 2020-10-18: qty 2

## 2020-10-18 MED ORDER — LIDOCAINE HCL 1 % IJ SOLN
INTRAMUSCULAR | Status: AC
  Filled 2020-10-18: qty 20

## 2020-10-18 MED ORDER — IOHEXOL 300 MG/ML  SOLN
100.0000 mL | Freq: Once | INTRAMUSCULAR | Status: AC | PRN
  Administered 2020-10-18: 11:00:00 30 mL via INTRA_ARTERIAL

## 2020-10-18 MED ORDER — MIDAZOLAM HCL 2 MG/2ML IJ SOLN
INTRAMUSCULAR | Status: AC | PRN
  Administered 2020-10-18 (×3): 1 mg via INTRAVENOUS

## 2020-10-18 MED ORDER — LIDOCAINE HCL (PF) 1 % IJ SOLN
INTRAMUSCULAR | Status: AC | PRN
  Administered 2020-10-18: 5 mL

## 2020-10-18 MED ORDER — FENTANYL CITRATE (PF) 100 MCG/2ML IJ SOLN
INTRAMUSCULAR | Status: AC | PRN
  Administered 2020-10-18 (×2): 50 ug via INTRAVENOUS
  Administered 2020-10-18: 25 ug via INTRAVENOUS

## 2020-10-18 MED ORDER — IOHEXOL 300 MG/ML  SOLN
100.0000 mL | Freq: Once | INTRAMUSCULAR | Status: AC | PRN
  Administered 2020-10-18: 11:00:00 42 mL via INTRA_ARTERIAL

## 2020-10-18 NOTE — H&P (Signed)
Chief Complaint: Patient was seen in consultation today for right upper extremity/chest swelling.  Referring Physician(s): Karla Garner  Supervising Physician: Jacqulynn Cadet  Patient Status: [facility] - Out-pt  History of Present Illness: Karla Garner is a 34 y.o. female with a past medical history significant for depression, anemia, HTN, glomerulonephritis and ESRD on HD via right AVF who presents today for evaluation of right upper extremity/chest swelling via fistulogram with possible intervention. Karla Garner is well known to IR due to multiple previous fistulograms and venograms with interventions due to known central venous stenosis - her last intervention was 02/15/20 with Dr. Pascal Lux where she underwent successful fistulogram with angioplasty and stent placement in the recurrent stenosis/near occlusion at the level of the junction of the right clavicle and 1st rib. She reports that for about 3 months following her last procedure her swelling had greatly improved which was a relief, however for the last 4 or so months the swelling has gradually returned and has become increasingly bothersome. She has had no issues with her fistula and last underwent HD yesterday without issue. She denies any chest pain or trouble breathing. She understands the procedure today and is agreeable to proceed as planned.  Past Medical History:  Diagnosis Date  . Anemia   . Depression   . ESRD (end stage renal disease) ([name])    from IgA nephritis  . Glomerulonephritis   . Hemodialysis patient ([name])    [name], Th, Sat  . Hypertension   . PONV (postoperative nausea and vomiting)    was on dialysis i during pregnancy2014  . Sleep apnea   . Thrombocytopenia ([place]) 2014    Past Surgical History:  Procedure Laterality Date  . A/V FISTULAGRAM N/A 01/08/2018   Procedure: A/V FISTULAGRAM - right arm;  Surgeon: [name], MD;  Location: [place] [facility];  Service: Cardiovascular;   Laterality: N/A;  . AV FISTULA PLACEMENT Left   . AV FISTULA PLACEMENT Right 04/14/2015   Procedure: CREATION OF RIGHT RADIOCEPHALIC ARTERIOVENOUS (AV) FISTULA ;  Surgeon: Angelia Mould, MD;  Location: [place];  Service: Vascular;  Laterality: Right;  . AV FISTULA PLACEMENT Right 11/22/2016   Procedure: ARTERIOVENOUS (AV) FISTULA CREATION;  Surgeon: [name], MD;  Location: [place];  Service: Vascular;  Laterality: Right;  . CESAREAN SECTION     2009  . ESOPHAGOGASTRODUODENOSCOPY N/A 11/24/2016   Procedure: ESOPHAGOGASTRODUODENOSCOPY (EGD);  Surgeon: [name], MD;  Location: [facility];  Service: Endoscopy;  Laterality: N/A;  . EXCHANGE OF A DIALYSIS CATHETER Right 04/14/2015   Procedure: EXCHANGE OF RIGHT INTERNAL JUGULAR DIALYSIS CATHETER;  Surgeon: Angelia Mould, MD;  Location: [place];  Service: Vascular;  Laterality: Right;  . INSERTION OF DIALYSIS CATHETER Left 11/22/2016   Procedure: INSERTION OF DIALYSIS CATHETER - LEFT INTERNAL JUGULAR PLACEMENT;  Surgeon: [name], MD;  Location: [place];  Service: Vascular;  Laterality: Left;  . IR AV DIALY SHUNT INTRO NEEDLE/INTRAC INITIAL W/PTA/STENT/IMG RT Right 02/15/2020  . IR DIALY SHUNT INTRO NEEDLE/INTRACATH INITIAL W/IMG RIGHT Right 07/26/2017  . IR DIALY SHUNT INTRO NEEDLE/INTRACATH INITIAL W/IMG RIGHT Right 10/18/2017  . IR DIALY SHUNT INTRO NEEDLE/INTRACATH INITIAL W/IMG RIGHT Right 03/10/2018  . IR DIALY SHUNT INTRO NEEDLE/INTRACATH INITIAL W/IMG RIGHT Right 12/30/2018  . IR RADIOLOGIST EVAL & MGMT  07/02/2019  . IR REMOVAL TUN CV CATH W/O FL  03/06/2017  . IR TRANSCATH PLC STENT 1ST ART NOT LE CV CAR VERT CAR  12/30/2018  .  IR US GUIDE VASC ACCESS RIGHT  12/30/2018  . IR US GUIDE VASC ACCESS RIGHT  12/30/2018  . IR US GUIDE VASC ACCESS RIGHT  02/15/2020  . IR VENO/EXT/UNI LEFT  12/30/2018  . LIGATION OF ARTERIOVENOUS  FISTULA Left 11/05/2014   Procedure: LIGATION OF LEFT ARM  BRACHIO-CEPHALIC  ARTERIOVENOUS  FISTULA ,& REPAIR OF BRACHIAL ARTERY.;  Surgeon: Mal [name], MD;  Location: [place];  Service: Vascular;  Laterality: Left;  . PERIPHERAL VASCULAR BALLOON ANGIOPLASTY  01/08/2018   Procedure: PERIPHERAL VASCULAR BALLOON ANGIOPLASTY;  Surgeon: [name], MD;  Location: [place] [facility];  Service: Cardiovascular;;  innominate vein  . THROMBECTOMY W/ EMBOLECTOMY Right 11/16/2016   Procedure: THROMBECTOMY REVISION OF ARTERIOVENOUS FISTULA - RIGHT ARM;  Surgeon: [name], MD;  Location: [place];  Service: Vascular;  Laterality: Right;  . TUBAL LIGATION  2014    Allergies: Patient has no known allergies.  Medications: Prior to Admission medications   Medication Sig Start Date End Date Taking? Authorizing Provider  diphenhydrAMINE (BENADRYL) 25 MG tablet Take 25 mg by mouth every 6 (six) hours as needed for itching.   Yes [provider]  ferric citrate (AURYXIA) 1 GM 210 MG(Fe) tablet Take 420-630 mg by mouth See admin instructions. Take 630 mg with each meal and large snack, take 420 mg with each small snack   Yes [provider]  GARLIC PO Take 1 capsule by mouth 2 (two) times daily.   Yes [provider]  Multiple Vitamin (MULTIVITAMIN WITH MINERALS) TABS tablet Take 1 tablet by mouth daily.   Yes [provider]  megestrol (MEGACE) 40 MG tablet Take 1 tablet (40 mg total) by mouth 2 (two) times daily. Patient not taking: Reported on 7/67/3419 3/79/02   [name], MD  Tetrahydrozoline HCl (VISINE OP) Place 1 drop into both eyes daily as needed (itchy eyes).    [provider]     Family History  Problem Relation Age of Onset  . Other Mother        no medical problems per patient  . Other Father        no medical problems per patient  . Colon cancer Neg Hx   . Liver cancer Neg Hx   . Stomach cancer Neg Hx     Social History   Socioeconomic History  . Marital status: Legally Separated    Spouse name:  Not on file  . Number of children: 3  . Years of education: Not on file  . Highest education level: Not on file  Occupational History  . Occupation: unemployed  Tobacco Use  . Smoking status: Never Smoker  . Smokeless tobacco: Never Used  Vaping Use  . Vaping Use: Never used  Substance and Sexual Activity  . Alcohol use: No    Alcohol/week: 0.0 standard drinks  . Drug use: No  . Sexual activity: Yes    Partners: Male  Other Topics Concern  . Not on file  Social History Narrative   As of February 2018 she has 3 children with whom she lives they are aged 54, 32 and 2.    Social Determinants of Health   Financial Resource Strain: Not on file  Food Insecurity: Not on file  Transportation Needs: Not on file  Physical Activity: Not on file  Stress: Not on file  Social Connections: Not on file     Review of Systems: A 12 point ROS discussed and pertinent positives are indicated  in the HPI above.  All other systems are negative.  Review of Systems  Constitutional: Negative for appetite change, chills and fever.  Respiratory: Negative for cough.   Cardiovascular: Negative for chest pain.  Gastrointestinal: Positive for nausea. Negative for abdominal pain, diarrhea and vomiting.  Musculoskeletal: Negative for back pain.       (+) right chest, back and upper arm swelling  Neurological: Negative for dizziness and headaches.    Vital Signs: BP (!) 142/92   Pulse 90   Temp 98.7 F (37.1 C) (Oral)   Resp 16   Ht 5\' 3"  (1.6 m)   Wt 95 lb (43.1 kg)   LMP 10/16/2020   SpO2 100%   BMI 16.83 kg/m   Physical Exam Vitals reviewed.  Constitutional:      General: She is not in acute distress. HENT:     Head: Normocephalic.     Mouth/Throat:     Mouth: Mucous membranes are moist.     Pharynx: Oropharynx is clear. No oropharyngeal exudate or posterior oropharyngeal erythema.  Cardiovascular:     Rate and Rhythm: Normal rate and regular rhythm.     Comments: (+) right upper  chest swelling; painless. Multiple collateral veins noted. (+) RUE fistula - palpable thrill, audible bruit, non tender, no open wounds/bleeding/drainage Pulmonary:     Effort: Pulmonary effort is normal.     Breath sounds: Normal breath sounds.  Abdominal:     General: There is no distension.     Palpations: Abdomen is soft.     Tenderness: There is no abdominal tenderness.  Skin:    General: Skin is warm and dry.  Neurological:     Mental Status: She is alert and oriented to person, place, and time.      MD Evaluation Airway: WNL Heart: WNL Abdomen: WNL Chest/ Lungs: WNL ASA  Classification: 3 Mallampati/Airway Score: One   Imaging: No results found.  Labs:  CBC: No results for input(s): WBC, HGB, HCT, PLT in the last 8760 hours.  COAGS: No results for input(s): INR, APTT in the last 8760 hours.  BMP: Recent Labs    02/15/20 0755  NA 137  K 4.8  CL 92*  CO2 27  GLUCOSE 87  BUN 83*  CALCIUM 10.0  CREATININE 12.53*  GFRNONAA 4*  GFRAA 4*    LIVER FUNCTION TESTS: No results for input(s): BILITOT, AST, ALT, ALKPHOS, PROT, ALBUMIN in the last 8760 hours.  TUMOR MARKERS: No results for input(s): AFPTM, CEA, CA199, CHROMGRNA in the last 8760 hours.  Assessment and Plan:  34 y/o F with history of central venous stenosis and ESRD on HD via RUE AV fistula who presents today with recurrent right chest, back and upper extremity swelling. Plan to proceed with fisutlogram/venogram with possible angioplasty/stent placement within central venous stenosis.  Patient has been NPO since midnight, no current anticoagulation/antiplatelet medications. Afebrile, K+ 5.2, creatinine 9.61.   Risks and benefits discussed with the patient including, but not limited to bleeding, infection, vascular injury, pulmonary embolism, need for tunneled HD catheter placement or even death.  All of the patient's questions were answered, patient is agreeable to proceed.  Consent signed  and in chart.  Thank you for this interesting consult.  I greatly enjoyed meeting Vickey Boak and look forward to participating in their care.  A copy of this report was sent to the requesting provider on this date.  Electronically Signed: Joaquim Nam, PA-C 10/18/2020, 8:38 AM   I spent  a total of 25 Minutes in face to face in clinical consultation, greater than 50% of which was counseling/coordinating care for right upper extremity fisutlogram/possible intervention.

## 2020-10-18 NOTE — Procedures (Signed)
Interventional Radiology Procedure Note  Procedure:  1.) PTA of 60% stenosis of the outflow vein of the RUE AVF 2.) PTA of 70% in-stent-stenosis in the subclavian vein  Complications: None  Estimated Blood Loss: None  Recommendations: - Bedrest x 1 hr - DC home   Signed,  Criselda Peaches, MD

## 2020-10-18 NOTE — Discharge Instructions (Addendum)
Fistulografa de dilisis, cuidados posteriores Dialysis Fistulogram, Care After  Esta hoja le proporciona informacin sobre cmo cuidarse despus del procedimiento. Su mdico tambin podr darle instrucciones ms especficas. Comunquese con su mdico si tiene problemas o preguntas. Qu puedo esperar despus del procedimiento? Despus del procedimiento, es comn [employer] siguientes sntomas:  Una pequea molestia en la zona en la que se coloc el tubo delgado y pequeo (catter) para el procedimiento.  Un pequeo hematoma alrededor de la fstula.  Somnolencia y cansancio Company secretary). Siga estas indicaciones en su casa: Actividad   Haga reposo en su casa y no levante objetos que pesen ms de 5 lb (2,3 kg) el da despus del procedimiento.  Reanude sus actividades normales segn lo indicado por el mdico. Pregntele al mdico qu actividades son seguras para usted.  No conduzca ni use maquinaria pesada mientras toma analgsicos recetados.  No conduzca durante 24horas si recibi un medicamento para ayudarlo a relajarse (sedante) durante el procedimiento. Medicamentos   [employer] de venta libre y los recetados solamente como se lo haya indicado el mdico. Cuidado del Environmental consultant de la puncin  Siga las indicaciones de su mdico acerca de cmo Government social research officer donde se insertaron los catteres. Asegrese de hacer lo siguiente: ? Lvese las manos con agua y jabn antes de Quarry manager las vendas (vendaje). Use desinfectante para manos si no dispone de [place] y [place]. ? Cambie el vendaje como se lo haya indicado el mdico. ? No retire los puntos (suturas), la goma para cerrar la piel o las tiras [place]. Es posible que estos cierres cutneos Animal nutritionist en la piel durante 2semanas o ms. Si los bordes de las tiras adhesivas empiezan a despegarse y Therapist, sports, puede recortar los que estn sueltos. No retire las tiras [facility] por completo a menos que el mdico se lo  indique.  Controle la zona de la puncin todos los das para detectar signos de infeccin. Est atento a los siguientes signos: ? Dolor, hinchazn o enrojecimiento. ? Lquido o sangre. ? Calor. ? Pus o mal olor. Instrucciones generales  No tome baos de inmersin, no nade ni use el jacuzzi hasta que el mdico lo autorice. Pregntele al mdico si puede ducharse. Thurston Pounds solo le permitan darse baos de [place].  Controle atentamente la fstula de dilisis. Verifique para asegurarse de que puede sentir una vibracin o un zumbido (frmito) cuando Risk manager los dedos sobre la fstula.  Evite daar el injerto o la fstula: ? No use ropa ajustada ni joyas en el brazo o la pierna que tiene el injerto o la fstula. ? Informe a todos sus mdicos que tiene un injerto o una fstula para dilisis. ? No permita extracciones de sangre, terapias intravenosas ni lecturas de la presin arterial en el brazo que tiene la fstula o el injerto. ? No permita que le apliquen vacunas antigripales ni otras vacunas en el brazo con la fstula o el injerto.  Concurra a todas las visitas de seguimiento como se lo haya indicado el mdico. Esto es importante. Comunquese con un mdico si:  Tiene enrojecimiento, hinchazn o dolor en el lugar donde se insert el catter.  Observa lquido o sangre que proviene del lugar de la insercin del catter.  El lugar de la insercin del catter est caliente al tacto.  Tiene pus o percibe mal olor que proviene del lugar de la insercin del catter.  Tiene fiebre o siente escalofros. Solicite ayuda de inmediato si:  Se siente dbil.  Tiene problemas de  equilibrio.  Tiene dificultad para mover los brazos o las piernas.  Tiene problemas visuales o para hablar.  Ya no puede sentir una vibracin o un zumbido cuando [employer] dedos sobre la fstula de dilisis.  La extremidad que se [place] para el procedimiento: ? Se hincha. ? Duele. ? Est fra. ? Cambia de color, por  ejemplo, se torna azulada o blanco plido.  Siente falta de aire o Tourist information centre manager. Resumen  Despus de [place] fistulografa de dilisis, es comn tener una pequea molestia o algunos moretones en la zona donde se coloc el tubo delgado y pequeo (catter).  Descanse en su [place] despus del procedimiento. Reanude sus actividades normales segn lo indicado por el mdico.  [employer] de venta libre y los recetados solamente como se lo haya indicado el mdico.  Siga las indicaciones de su mdico acerca de cmo Government social research officer donde se insert el catter.  Concurra a todas las visitas de seguimiento como se lo haya indicado el mdico. Esta informacin no tiene Marine scientist el consejo del mdico. Asegrese de hacerle al mdico cualquier pregunta que tenga. Document Revised: 12/11/2017 Document Reviewed: 12/11/2017 Elsevier Patient Education  2020 [employer].

## 2020-10-19 ENCOUNTER — Encounter (HOSPITAL_COMMUNITY): Payer: Self-pay

## 2020-10-19 ENCOUNTER — Other Ambulatory Visit (HOSPITAL_COMMUNITY): Payer: Self-pay | Admitting: Nephrology

## 2020-10-19 DIAGNOSIS — I871 Compression of vein: Secondary | ICD-10-CM

## 2020-10-27 ENCOUNTER — Ambulatory Visit: Payer: Self-pay | Admitting: Nurse Practitioner

## 2021-06-05 DIAGNOSIS — E8809 Other disorders of plasma-protein metabolism, not elsewhere classified: Secondary | ICD-10-CM | POA: Insufficient documentation

## 2021-08-23 DIAGNOSIS — L299 Pruritus, unspecified: Secondary | ICD-10-CM | POA: Diagnosis not present

## 2021-08-23 DIAGNOSIS — N186 End stage renal disease: Secondary | ICD-10-CM | POA: Diagnosis not present

## 2021-08-23 DIAGNOSIS — N2581 Secondary hyperparathyroidism of renal origin: Secondary | ICD-10-CM | POA: Diagnosis not present

## 2021-08-23 DIAGNOSIS — Z992 Dependence on renal dialysis: Secondary | ICD-10-CM | POA: Diagnosis not present

## 2021-08-25 DIAGNOSIS — Z992 Dependence on renal dialysis: Secondary | ICD-10-CM | POA: Diagnosis not present

## 2021-08-25 DIAGNOSIS — L299 Pruritus, unspecified: Secondary | ICD-10-CM | POA: Diagnosis not present

## 2021-08-25 DIAGNOSIS — N2581 Secondary hyperparathyroidism of renal origin: Secondary | ICD-10-CM | POA: Diagnosis not present

## 2021-08-25 DIAGNOSIS — N186 End stage renal disease: Secondary | ICD-10-CM | POA: Diagnosis not present

## 2021-08-28 DIAGNOSIS — N186 End stage renal disease: Secondary | ICD-10-CM | POA: Diagnosis not present

## 2021-08-28 DIAGNOSIS — L299 Pruritus, unspecified: Secondary | ICD-10-CM | POA: Diagnosis not present

## 2021-08-28 DIAGNOSIS — Z992 Dependence on renal dialysis: Secondary | ICD-10-CM | POA: Diagnosis not present

## 2021-08-28 DIAGNOSIS — N2581 Secondary hyperparathyroidism of renal origin: Secondary | ICD-10-CM | POA: Diagnosis not present

## 2021-08-30 DIAGNOSIS — N2581 Secondary hyperparathyroidism of renal origin: Secondary | ICD-10-CM | POA: Diagnosis not present

## 2021-08-30 DIAGNOSIS — N186 End stage renal disease: Secondary | ICD-10-CM | POA: Diagnosis not present

## 2021-08-30 DIAGNOSIS — L299 Pruritus, unspecified: Secondary | ICD-10-CM | POA: Diagnosis not present

## 2021-08-30 DIAGNOSIS — Z992 Dependence on renal dialysis: Secondary | ICD-10-CM | POA: Diagnosis not present

## 2021-08-31 DIAGNOSIS — I071 Rheumatic tricuspid insufficiency: Secondary | ICD-10-CM | POA: Diagnosis not present

## 2021-08-31 DIAGNOSIS — Z01818 Encounter for other preprocedural examination: Secondary | ICD-10-CM | POA: Diagnosis not present

## 2021-08-31 DIAGNOSIS — Z01810 Encounter for preprocedural cardiovascular examination: Secondary | ICD-10-CM | POA: Diagnosis not present

## 2021-08-31 DIAGNOSIS — I340 Nonrheumatic mitral (valve) insufficiency: Secondary | ICD-10-CM | POA: Diagnosis not present

## 2021-09-01 DIAGNOSIS — L299 Pruritus, unspecified: Secondary | ICD-10-CM | POA: Diagnosis not present

## 2021-09-01 DIAGNOSIS — N186 End stage renal disease: Secondary | ICD-10-CM | POA: Diagnosis not present

## 2021-09-01 DIAGNOSIS — Z992 Dependence on renal dialysis: Secondary | ICD-10-CM | POA: Diagnosis not present

## 2021-09-01 DIAGNOSIS — N2581 Secondary hyperparathyroidism of renal origin: Secondary | ICD-10-CM | POA: Diagnosis not present

## 2021-09-04 DIAGNOSIS — N2581 Secondary hyperparathyroidism of renal origin: Secondary | ICD-10-CM | POA: Diagnosis not present

## 2021-09-04 DIAGNOSIS — N186 End stage renal disease: Secondary | ICD-10-CM | POA: Diagnosis not present

## 2021-09-04 DIAGNOSIS — L299 Pruritus, unspecified: Secondary | ICD-10-CM | POA: Diagnosis not present

## 2021-09-04 DIAGNOSIS — Z992 Dependence on renal dialysis: Secondary | ICD-10-CM | POA: Diagnosis not present

## 2021-09-06 DIAGNOSIS — Z992 Dependence on renal dialysis: Secondary | ICD-10-CM | POA: Diagnosis not present

## 2021-09-06 DIAGNOSIS — N2581 Secondary hyperparathyroidism of renal origin: Secondary | ICD-10-CM | POA: Diagnosis not present

## 2021-09-06 DIAGNOSIS — N186 End stage renal disease: Secondary | ICD-10-CM | POA: Diagnosis not present

## 2021-09-06 DIAGNOSIS — L299 Pruritus, unspecified: Secondary | ICD-10-CM | POA: Diagnosis not present

## 2021-09-08 DIAGNOSIS — N2581 Secondary hyperparathyroidism of renal origin: Secondary | ICD-10-CM | POA: Diagnosis not present

## 2021-09-08 DIAGNOSIS — L299 Pruritus, unspecified: Secondary | ICD-10-CM | POA: Diagnosis not present

## 2021-09-08 DIAGNOSIS — Z992 Dependence on renal dialysis: Secondary | ICD-10-CM | POA: Diagnosis not present

## 2021-09-08 DIAGNOSIS — N186 End stage renal disease: Secondary | ICD-10-CM | POA: Diagnosis not present

## 2021-09-11 DIAGNOSIS — L299 Pruritus, unspecified: Secondary | ICD-10-CM | POA: Diagnosis not present

## 2021-09-11 DIAGNOSIS — N2581 Secondary hyperparathyroidism of renal origin: Secondary | ICD-10-CM | POA: Diagnosis not present

## 2021-09-11 DIAGNOSIS — Z992 Dependence on renal dialysis: Secondary | ICD-10-CM | POA: Diagnosis not present

## 2021-09-11 DIAGNOSIS — N186 End stage renal disease: Secondary | ICD-10-CM | POA: Diagnosis not present

## 2021-09-13 DIAGNOSIS — L299 Pruritus, unspecified: Secondary | ICD-10-CM | POA: Diagnosis not present

## 2021-09-13 DIAGNOSIS — Z992 Dependence on renal dialysis: Secondary | ICD-10-CM | POA: Diagnosis not present

## 2021-09-13 DIAGNOSIS — N186 End stage renal disease: Secondary | ICD-10-CM | POA: Diagnosis not present

## 2021-09-13 DIAGNOSIS — N2581 Secondary hyperparathyroidism of renal origin: Secondary | ICD-10-CM | POA: Diagnosis not present

## 2021-09-16 DIAGNOSIS — Z992 Dependence on renal dialysis: Secondary | ICD-10-CM | POA: Diagnosis not present

## 2021-09-16 DIAGNOSIS — L299 Pruritus, unspecified: Secondary | ICD-10-CM | POA: Diagnosis not present

## 2021-09-16 DIAGNOSIS — N186 End stage renal disease: Secondary | ICD-10-CM | POA: Diagnosis not present

## 2021-09-16 DIAGNOSIS — N2581 Secondary hyperparathyroidism of renal origin: Secondary | ICD-10-CM | POA: Diagnosis not present

## 2021-09-18 DIAGNOSIS — N2581 Secondary hyperparathyroidism of renal origin: Secondary | ICD-10-CM | POA: Diagnosis not present

## 2021-09-18 DIAGNOSIS — Z992 Dependence on renal dialysis: Secondary | ICD-10-CM | POA: Diagnosis not present

## 2021-09-18 DIAGNOSIS — L299 Pruritus, unspecified: Secondary | ICD-10-CM | POA: Diagnosis not present

## 2021-09-18 DIAGNOSIS — N186 End stage renal disease: Secondary | ICD-10-CM | POA: Diagnosis not present

## 2021-09-20 DIAGNOSIS — N042 Nephrotic syndrome with diffuse membranous glomerulonephritis: Secondary | ICD-10-CM | POA: Diagnosis not present

## 2021-09-20 DIAGNOSIS — L299 Pruritus, unspecified: Secondary | ICD-10-CM | POA: Diagnosis not present

## 2021-09-20 DIAGNOSIS — N028 Recurrent and persistent hematuria with other morphologic changes: Secondary | ICD-10-CM | POA: Diagnosis not present

## 2021-09-20 DIAGNOSIS — N186 End stage renal disease: Secondary | ICD-10-CM | POA: Diagnosis not present

## 2021-09-20 DIAGNOSIS — Z01818 Encounter for other preprocedural examination: Secondary | ICD-10-CM | POA: Diagnosis not present

## 2021-09-20 DIAGNOSIS — N2581 Secondary hyperparathyroidism of renal origin: Secondary | ICD-10-CM | POA: Diagnosis not present

## 2021-09-20 DIAGNOSIS — Z992 Dependence on renal dialysis: Secondary | ICD-10-CM | POA: Diagnosis not present

## 2021-09-20 DIAGNOSIS — I151 Hypertension secondary to other renal disorders: Secondary | ICD-10-CM | POA: Diagnosis not present

## 2021-09-22 DIAGNOSIS — Z992 Dependence on renal dialysis: Secondary | ICD-10-CM | POA: Diagnosis not present

## 2021-09-22 DIAGNOSIS — N2581 Secondary hyperparathyroidism of renal origin: Secondary | ICD-10-CM | POA: Diagnosis not present

## 2021-09-22 DIAGNOSIS — N186 End stage renal disease: Secondary | ICD-10-CM | POA: Diagnosis not present

## 2021-09-22 DIAGNOSIS — L299 Pruritus, unspecified: Secondary | ICD-10-CM | POA: Diagnosis not present

## 2021-09-25 DIAGNOSIS — N2581 Secondary hyperparathyroidism of renal origin: Secondary | ICD-10-CM | POA: Diagnosis not present

## 2021-09-25 DIAGNOSIS — L299 Pruritus, unspecified: Secondary | ICD-10-CM | POA: Diagnosis not present

## 2021-09-25 DIAGNOSIS — Z992 Dependence on renal dialysis: Secondary | ICD-10-CM | POA: Diagnosis not present

## 2021-09-25 DIAGNOSIS — N186 End stage renal disease: Secondary | ICD-10-CM | POA: Diagnosis not present

## 2021-09-27 ENCOUNTER — Ambulatory Visit: Payer: Self-pay | Admitting: Surgery

## 2021-09-27 DIAGNOSIS — N2581 Secondary hyperparathyroidism of renal origin: Secondary | ICD-10-CM | POA: Diagnosis not present

## 2021-09-27 DIAGNOSIS — Z992 Dependence on renal dialysis: Secondary | ICD-10-CM | POA: Diagnosis not present

## 2021-09-27 DIAGNOSIS — L299 Pruritus, unspecified: Secondary | ICD-10-CM | POA: Diagnosis not present

## 2021-09-27 DIAGNOSIS — N186 End stage renal disease: Secondary | ICD-10-CM | POA: Diagnosis not present

## 2021-09-29 DIAGNOSIS — N2581 Secondary hyperparathyroidism of renal origin: Secondary | ICD-10-CM | POA: Diagnosis not present

## 2021-09-29 DIAGNOSIS — N186 End stage renal disease: Secondary | ICD-10-CM | POA: Diagnosis not present

## 2021-09-29 DIAGNOSIS — L299 Pruritus, unspecified: Secondary | ICD-10-CM | POA: Diagnosis not present

## 2021-09-29 DIAGNOSIS — Z992 Dependence on renal dialysis: Secondary | ICD-10-CM | POA: Diagnosis not present

## 2021-10-02 DIAGNOSIS — N186 End stage renal disease: Secondary | ICD-10-CM | POA: Diagnosis not present

## 2021-10-02 DIAGNOSIS — N2581 Secondary hyperparathyroidism of renal origin: Secondary | ICD-10-CM | POA: Diagnosis not present

## 2021-10-02 DIAGNOSIS — Z992 Dependence on renal dialysis: Secondary | ICD-10-CM | POA: Diagnosis not present

## 2021-10-02 DIAGNOSIS — L299 Pruritus, unspecified: Secondary | ICD-10-CM | POA: Diagnosis not present

## 2021-10-02 DIAGNOSIS — I871 Compression of vein: Secondary | ICD-10-CM | POA: Insufficient documentation

## 2021-10-02 DIAGNOSIS — R911 Solitary pulmonary nodule: Secondary | ICD-10-CM | POA: Insufficient documentation

## 2021-10-04 DIAGNOSIS — N2581 Secondary hyperparathyroidism of renal origin: Secondary | ICD-10-CM | POA: Diagnosis not present

## 2021-10-04 DIAGNOSIS — N186 End stage renal disease: Secondary | ICD-10-CM | POA: Diagnosis not present

## 2021-10-04 DIAGNOSIS — L299 Pruritus, unspecified: Secondary | ICD-10-CM | POA: Diagnosis not present

## 2021-10-04 DIAGNOSIS — Z992 Dependence on renal dialysis: Secondary | ICD-10-CM | POA: Diagnosis not present

## 2021-10-06 DIAGNOSIS — L299 Pruritus, unspecified: Secondary | ICD-10-CM | POA: Diagnosis not present

## 2021-10-06 DIAGNOSIS — N186 End stage renal disease: Secondary | ICD-10-CM | POA: Diagnosis not present

## 2021-10-06 DIAGNOSIS — N2581 Secondary hyperparathyroidism of renal origin: Secondary | ICD-10-CM | POA: Diagnosis not present

## 2021-10-06 DIAGNOSIS — Z992 Dependence on renal dialysis: Secondary | ICD-10-CM | POA: Diagnosis not present

## 2021-10-09 DIAGNOSIS — Z992 Dependence on renal dialysis: Secondary | ICD-10-CM | POA: Diagnosis not present

## 2021-10-09 DIAGNOSIS — N2581 Secondary hyperparathyroidism of renal origin: Secondary | ICD-10-CM | POA: Diagnosis not present

## 2021-10-09 DIAGNOSIS — N186 End stage renal disease: Secondary | ICD-10-CM | POA: Diagnosis not present

## 2021-10-09 DIAGNOSIS — L299 Pruritus, unspecified: Secondary | ICD-10-CM | POA: Diagnosis not present

## 2021-10-11 DIAGNOSIS — Z992 Dependence on renal dialysis: Secondary | ICD-10-CM | POA: Diagnosis not present

## 2021-10-11 DIAGNOSIS — L299 Pruritus, unspecified: Secondary | ICD-10-CM | POA: Diagnosis not present

## 2021-10-11 DIAGNOSIS — N186 End stage renal disease: Secondary | ICD-10-CM | POA: Diagnosis not present

## 2021-10-11 DIAGNOSIS — N2581 Secondary hyperparathyroidism of renal origin: Secondary | ICD-10-CM | POA: Diagnosis not present

## 2021-10-13 DIAGNOSIS — N2581 Secondary hyperparathyroidism of renal origin: Secondary | ICD-10-CM | POA: Diagnosis not present

## 2021-10-13 DIAGNOSIS — N186 End stage renal disease: Secondary | ICD-10-CM | POA: Diagnosis not present

## 2021-10-13 DIAGNOSIS — L299 Pruritus, unspecified: Secondary | ICD-10-CM | POA: Diagnosis not present

## 2021-10-13 DIAGNOSIS — Z992 Dependence on renal dialysis: Secondary | ICD-10-CM | POA: Diagnosis not present

## 2021-10-15 NOTE — Progress Notes (Signed)
° ° °  SUBJECTIVE:   CHIEF COMPLAINT / HPI:   PAP smear Last Pap 11/19/2017, resulted NILM negative for trichomoniasis, chlamydia, gonorrhea Patient denies any discharge or odor No concern for specific STI exposure Patient would like STI screening as she is preparing for kidney transplant.  Requests results mailed to her.  Discussed that the results here should be visible to other physicians in the epic system.  Dyspareunia Patient reports painful intercourse Says that penetration feels knifelike and sharp  Possible uterine prolapse She reports that after several hours of standing up, she feels as though "something is coming out" of her vagina and she gets some bleeding History of 3 pregnancies No previously diagnosed prolapse Gradually worsening over 3-year time  PERTINENT  PMH / PSH:  Patient Active Problem List   Diagnosis Date Noted   Cervical cancer screening 10/17/2021   Routine screening for STI (sexually transmitted infection) 10/17/2021   Uterine prolapse 10/17/2021   Dyspareunia in female 10/17/2021   Anxiety state 06/05/2018   Healthcare maintenance 11/21/2017   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   Depression 11/01/2017   Acid reflux 11/01/2017   ESRD on hemodialysis ([place])    Anemia of chronic kidney failure 04/09/2015   Hypertension 04/30/2013   Glomerulonephritis, IgA 04/30/2013     OBJECTIVE:   BP 132/89    Pulse 87    Ht 5\' 3"  (1.6 m)    Wt 93 lb 6 oz (42.4 kg)    LMP 10/02/2021 (Exact Date)    SpO2 100%    BMI 16.54 kg/m   Physical Exam General: Awake, alert, oriented, no acute distress Respiratory: Normal work of breathing, no respiratory distress Neuro: Cranial nerves II through X grossly intact, able to move all extremities spontaneously Vulva: Normal appearing vulva without rashes, lesions, or deformities, narrow introitus painful to speculum introduction Vagina: Pale pink slightly atrophic vaginal tissue without obvious lesions, vaginal canal painful  to speculum introduction and advancing, physiologic discharge of whitish color, cervix tender, friable, and bleeding to swab  ASSESSMENT/PLAN:   Cervical cancer screening Last Pap 11/19/2017 resulted NILM and negative for trichomoniasis, GC/CH.  Vaginal exam today demonstrates tender vaginal canal and cervix, cervix is friable, tender, easily bleeds with swab.  Concern for atrophic changes versus trichomonas.  Routine screening for STI (sexually transmitted infection) Patient requests screening today for trichomonas, gonorrhea, chlamydia, HIV, RPR.  No specific concerns for exposure or current symptoms.  She needs these results sent to her nephrologist as she is preparing for a kidney transplant.  Uterine prolapse New, gradually worsening.  Patient appreciate symptoms after standing for long periods of time, sensation is associated with vaginal bleeding.  Previously diagnosed uterine prolapse, however symptoms certainly suggested.  Pelvic exam today quite difficult given tender cervix, was unable to appreciate uterine prolapse during exam.  Will send referral for urogynecologist for both this problem and her dyspareunia.  Dyspareunia in female Chronic, gradually worsening over 3 year period.  Reports sharp knifelike pain with penetration.  Vaginal exam today difficult due to narrow introitus, atrophic vaginal lining, and tender cervix.  Screened for STIs.  We will send referral to urogynecologist for both this problem and her uterine prolapse symptoms.     Ezequiel Essex, MD [place]

## 2021-10-16 DIAGNOSIS — N2581 Secondary hyperparathyroidism of renal origin: Secondary | ICD-10-CM | POA: Diagnosis not present

## 2021-10-16 DIAGNOSIS — Z992 Dependence on renal dialysis: Secondary | ICD-10-CM | POA: Diagnosis not present

## 2021-10-16 DIAGNOSIS — N186 End stage renal disease: Secondary | ICD-10-CM | POA: Diagnosis not present

## 2021-10-16 DIAGNOSIS — T7840XA Allergy, unspecified, initial encounter: Secondary | ICD-10-CM | POA: Diagnosis not present

## 2021-10-16 DIAGNOSIS — L299 Pruritus, unspecified: Secondary | ICD-10-CM | POA: Diagnosis not present

## 2021-10-17 ENCOUNTER — Other Ambulatory Visit (HOSPITAL_COMMUNITY)
Admission: RE | Admit: 2021-10-17 | Discharge: 2021-10-17 | Disposition: A | Discharge status: Home / Self Care | Payer: BC Managed Care – PPO | Source: Ambulatory Visit | Attending: Family Medicine | Admitting: Family Medicine

## 2021-10-17 ENCOUNTER — Other Ambulatory Visit: Payer: Self-pay

## 2021-10-17 ENCOUNTER — Ambulatory Visit (INDEPENDENT_AMBULATORY_CARE_PROVIDER_SITE_OTHER): Payer: BC Managed Care – PPO | Admitting: Family Medicine

## 2021-10-17 ENCOUNTER — Encounter: Payer: Self-pay | Admitting: Family Medicine

## 2021-10-17 VITALS — BP 132/89 | HR 87 | Ht 63.0 in | Wt 93.4 lb

## 2021-10-17 DIAGNOSIS — N939 Abnormal uterine and vaginal bleeding, unspecified: Secondary | ICD-10-CM

## 2021-10-17 DIAGNOSIS — Z124 Encounter for screening for malignant neoplasm of cervix: Secondary | ICD-10-CM

## 2021-10-17 DIAGNOSIS — Z113 Encounter for screening for infections with a predominantly sexual mode of transmission: Secondary | ICD-10-CM | POA: Diagnosis not present

## 2021-10-17 DIAGNOSIS — N941 Unspecified dyspareunia: Secondary | ICD-10-CM

## 2021-10-17 DIAGNOSIS — Z23 Encounter for immunization: Secondary | ICD-10-CM

## 2021-10-17 DIAGNOSIS — N814 Uterovaginal prolapse, unspecified: Secondary | ICD-10-CM

## 2021-10-17 NOTE — Assessment & Plan Note (Addendum)
Chronic, gradually worsening over 3 year period.  Reports sharp knifelike pain with penetration.  Vaginal exam today difficult due to narrow introitus, atrophic vaginal lining, and tender cervix.  Screened for STIs.  We will send referral to urogynecologist for both this problem and her uterine prolapse symptoms.

## 2021-10-17 NOTE — Patient Instructions (Signed)
I have translated the following text using Google translate.  As such, there are many errors.  I apologize for the poor written translation; however, we do not have written  translation services yet.  He traducido el siguiente texto [employer] traductor de [employer]. Como tal, hay muchos errores. Pido disculpas por la mala traduccin escrita; sin embargo, an no contamos con servicios de traduccin escrita.  It was wonderful to meet you today. Thank you for allowing me to be a part of your care. Below is a short summary of what we discussed at your visit today: Fue maravilloso conocerte hoy. Gracias por permitirme ser parte de su cuidado. A continuacin se muestra un breve resumen de lo que discutimos en su visita de hoy:  Pap smear and infection screenings Today we collected your Pap smear.  We also did a vaginal swab for gonorrhea, chlamydia, and trichomonas.  We collected blood for HIV and syphilis. If the results are normal, I will send you a letter. If the results are abnormal, I will give you a call.   Papanicolaou y pruebas de deteccin de infecciones [place] recogimos tu prueba de Papanicolaou. Tambin hicimos un frotis vaginal para gonorrea, clamidia y tricomonas. Recolectamos sangre para VIH y sfilis. Si los resultados son normales, le enviar Risk analyst. Si los resultados son anormales, te Solicitor.  Urogynecology specialist I have referred you to a uro-gynecologist.  This is a specialist doctor who treats women's reproductive tracts and urinary systems.  They will help [place] diagnose and treat your possible uterine prolapse and the pain with intercourse. especialista en uroginecologa Te he derivado a un uro-gineclogo. Este es un mdico especialista que trata las vas reproductivas y los sistemas urinarios de las mujeres. Ellos nos ayudarn a diagnosticar y tratar tu posible prolapso uterino y Conservation officer, historic buildings con las relaciones sexuales.  Vaccines Today you received the prevnar and the Tdap vaccine.  You  may experience some residual soreness at the injection site.  Gentle stretches and regular use of that arm will help speed up your recovery.  As the vaccines are giving your immune system a "practice run" against specific infections, you may feel a little under the weather for the next several days.  We recommend rest as needed and hydrating. Vacunas [place] recibiste la vacuna prevnar y la Tdap. Es posible que experimente algo de dolor residual en el lugar de la inyeccin. Los estiramientos suaves y el uso regular de ese brazo ayudarn a Conservation officer, historic buildings su recuperacin. A medida que las vacunas le estn dando a su sistema inmunitario una "prctica" contra infecciones especficas, es posible que se sienta un poco mal durante los [employer]. Recomendamos descansar lo necesario e hidratarse.    Please bring all of your medications to every appointment! Por favor traiga todos sus medicamentos a cada cita!  If you have any questions or concerns, please do not hesitate to contact us via phone or MyChart message.  Si tiene alguna pregunta o inquietud, no dude en comunicarse con nosotros por telfono o mensaje de MyChart.  Ezequiel Essex, MD

## 2021-10-17 NOTE — Assessment & Plan Note (Signed)
Last Pap 11/19/2017 resulted NILM and negative for trichomoniasis, GC/CH.  Vaginal exam today demonstrates tender vaginal canal and cervix, cervix is friable, tender, easily bleeds with swab.  Concern for atrophic changes versus trichomonas.

## 2021-10-17 NOTE — Assessment & Plan Note (Signed)
New, gradually worsening.  Patient appreciate symptoms after standing for long periods of time, sensation is associated with vaginal bleeding.  Previously diagnosed uterine prolapse, however symptoms certainly suggested.  Pelvic exam today quite difficult given tender cervix, was unable to appreciate uterine prolapse during exam.  Will send referral for urogynecologist for both this problem and her dyspareunia.

## 2021-10-17 NOTE — Assessment & Plan Note (Signed)
Patient requests screening today for trichomonas, gonorrhea, chlamydia, HIV, RPR.  No specific concerns for exposure or current symptoms.  She needs these results sent to her nephrologist as she is preparing for a kidney transplant.

## 2021-10-18 DIAGNOSIS — N2581 Secondary hyperparathyroidism of renal origin: Secondary | ICD-10-CM | POA: Diagnosis not present

## 2021-10-18 DIAGNOSIS — N186 End stage renal disease: Secondary | ICD-10-CM | POA: Diagnosis not present

## 2021-10-18 DIAGNOSIS — L299 Pruritus, unspecified: Secondary | ICD-10-CM | POA: Diagnosis not present

## 2021-10-18 DIAGNOSIS — T7840XA Allergy, unspecified, initial encounter: Secondary | ICD-10-CM | POA: Diagnosis not present

## 2021-10-18 DIAGNOSIS — Z992 Dependence on renal dialysis: Secondary | ICD-10-CM | POA: Diagnosis not present

## 2021-10-20 ENCOUNTER — Encounter: Payer: Self-pay | Admitting: Family Medicine

## 2021-10-20 ENCOUNTER — Telehealth: Payer: Self-pay | Admitting: Family Medicine

## 2021-10-20 DIAGNOSIS — Z992 Dependence on renal dialysis: Secondary | ICD-10-CM | POA: Diagnosis not present

## 2021-10-20 DIAGNOSIS — T7840XA Allergy, unspecified, initial encounter: Secondary | ICD-10-CM | POA: Diagnosis not present

## 2021-10-20 DIAGNOSIS — N186 End stage renal disease: Secondary | ICD-10-CM | POA: Diagnosis not present

## 2021-10-20 DIAGNOSIS — N2581 Secondary hyperparathyroidism of renal origin: Secondary | ICD-10-CM | POA: Diagnosis not present

## 2021-10-20 DIAGNOSIS — L299 Pruritus, unspecified: Secondary | ICD-10-CM | POA: Diagnosis not present

## 2021-10-20 LAB — CYTOLOGY - PAP
Chlamydia: NEGATIVE
Comment: NEGATIVE
Comment: NEGATIVE
Comment: NEGATIVE
Comment: NORMAL
High risk HPV: NEGATIVE
Neisseria Gonorrhea: NEGATIVE
Trichomonas: NEGATIVE

## 2021-10-20 NOTE — Telephone Encounter (Signed)
PAP returned LSIL and HPV negative. Current ASCCP guidelines indicate she should return for repeat PAP and HPV testing in one year. Called with Spanish interpreter phone service and spoke directly to patient. All questions answered.   Ezequiel Essex, MD

## 2021-10-21 DIAGNOSIS — Z992 Dependence on renal dialysis: Secondary | ICD-10-CM | POA: Diagnosis not present

## 2021-10-21 DIAGNOSIS — N186 End stage renal disease: Secondary | ICD-10-CM | POA: Diagnosis not present

## 2021-10-21 DIAGNOSIS — N042 Nephrotic syndrome with diffuse membranous glomerulonephritis: Secondary | ICD-10-CM | POA: Diagnosis not present

## 2021-10-23 DIAGNOSIS — L299 Pruritus, unspecified: Secondary | ICD-10-CM | POA: Diagnosis not present

## 2021-10-23 DIAGNOSIS — Z992 Dependence on renal dialysis: Secondary | ICD-10-CM | POA: Diagnosis not present

## 2021-10-23 DIAGNOSIS — N186 End stage renal disease: Secondary | ICD-10-CM | POA: Diagnosis not present

## 2021-10-23 DIAGNOSIS — N2581 Secondary hyperparathyroidism of renal origin: Secondary | ICD-10-CM | POA: Diagnosis not present

## 2021-10-25 DIAGNOSIS — N2581 Secondary hyperparathyroidism of renal origin: Secondary | ICD-10-CM | POA: Diagnosis not present

## 2021-10-25 DIAGNOSIS — N186 End stage renal disease: Secondary | ICD-10-CM | POA: Diagnosis not present

## 2021-10-25 DIAGNOSIS — L299 Pruritus, unspecified: Secondary | ICD-10-CM | POA: Diagnosis not present

## 2021-10-25 DIAGNOSIS — Z992 Dependence on renal dialysis: Secondary | ICD-10-CM | POA: Diagnosis not present

## 2021-10-27 DIAGNOSIS — N2581 Secondary hyperparathyroidism of renal origin: Secondary | ICD-10-CM | POA: Diagnosis not present

## 2021-10-27 DIAGNOSIS — L299 Pruritus, unspecified: Secondary | ICD-10-CM | POA: Diagnosis not present

## 2021-10-27 DIAGNOSIS — N186 End stage renal disease: Secondary | ICD-10-CM | POA: Diagnosis not present

## 2021-10-27 DIAGNOSIS — Z992 Dependence on renal dialysis: Secondary | ICD-10-CM | POA: Diagnosis not present

## 2021-10-30 DIAGNOSIS — Z992 Dependence on renal dialysis: Secondary | ICD-10-CM | POA: Diagnosis not present

## 2021-10-30 DIAGNOSIS — N186 End stage renal disease: Secondary | ICD-10-CM | POA: Diagnosis not present

## 2021-10-30 DIAGNOSIS — N2581 Secondary hyperparathyroidism of renal origin: Secondary | ICD-10-CM | POA: Diagnosis not present

## 2021-10-30 DIAGNOSIS — L299 Pruritus, unspecified: Secondary | ICD-10-CM | POA: Diagnosis not present

## 2021-11-01 DIAGNOSIS — L299 Pruritus, unspecified: Secondary | ICD-10-CM | POA: Diagnosis not present

## 2021-11-01 DIAGNOSIS — N186 End stage renal disease: Secondary | ICD-10-CM | POA: Diagnosis not present

## 2021-11-01 DIAGNOSIS — Z992 Dependence on renal dialysis: Secondary | ICD-10-CM | POA: Diagnosis not present

## 2021-11-01 DIAGNOSIS — N2581 Secondary hyperparathyroidism of renal origin: Secondary | ICD-10-CM | POA: Diagnosis not present

## 2021-11-02 DIAGNOSIS — N186 End stage renal disease: Secondary | ICD-10-CM | POA: Diagnosis not present

## 2021-11-02 DIAGNOSIS — Z940 Kidney transplant status: Secondary | ICD-10-CM | POA: Diagnosis not present

## 2021-11-02 DIAGNOSIS — Z01818 Encounter for other preprocedural examination: Secondary | ICD-10-CM | POA: Diagnosis not present

## 2021-11-02 DIAGNOSIS — Z23 Encounter for immunization: Secondary | ICD-10-CM | POA: Diagnosis not present

## 2021-11-02 DIAGNOSIS — Z992 Dependence on renal dialysis: Secondary | ICD-10-CM | POA: Diagnosis not present

## 2021-11-03 DIAGNOSIS — N186 End stage renal disease: Secondary | ICD-10-CM | POA: Diagnosis not present

## 2021-11-03 DIAGNOSIS — Z992 Dependence on renal dialysis: Secondary | ICD-10-CM | POA: Diagnosis not present

## 2021-11-03 DIAGNOSIS — L299 Pruritus, unspecified: Secondary | ICD-10-CM | POA: Diagnosis not present

## 2021-11-03 DIAGNOSIS — N2581 Secondary hyperparathyroidism of renal origin: Secondary | ICD-10-CM | POA: Diagnosis not present

## 2021-11-05 ENCOUNTER — Encounter (HOSPITAL_COMMUNITY): Payer: Self-pay | Admitting: Surgery

## 2021-11-05 DIAGNOSIS — N2581 Secondary hyperparathyroidism of renal origin: Secondary | ICD-10-CM | POA: Diagnosis present

## 2021-11-05 NOTE — H&P (Signed)
REFERRING PHYSICIAN: Rowe Clack, MD  PROVIDER: Godric Lavell Charlotta Newton, MD  Chief Complaint: New Consultation (Secondary hyperparathyroidism, ESRD)   History of Present Illness:  Patient is referred by Dr. Edrick Oh for surgical evaluation and management of secondary hyperparathyroidism due to end-stage renal disease. Patient is accompanied by a family member who acts as an Astronomer. The patient speaks [place] and limited [place]. The patient has been on hemodialysis for approximately 10 years. Her current access is in her right upper extremity. Patient has developed secondary hyperparathyroidism. Recent laboratory studies show an elevated intact PTH level of 1404, a calcium level of 10.0, and a calcium times phosphorus product of 70. Patient has noted bone and joint discomfort. She is noted chronic fatigue. Patient has had no prior head or neck surgery. She dialyzes at the [facility].  Review of Systems: A complete review of systems was obtained from the patient. I have reviewed this information and discussed as appropriate with the patient. See HPI as well for other ROS.  Review of Systems  Constitutional: Positive for malaise/fatigue.  HENT: Negative.  Eyes: Negative.  Respiratory: Negative.  Cardiovascular: Negative.  Gastrointestinal: Negative.  Genitourinary: Negative.  Musculoskeletal: Positive for joint pain, myalgias and neck pain.  Skin: Negative.  Neurological: Negative.  Endo/Heme/Allergies: Negative.  Psychiatric/Behavioral: Negative.    Medical History: History reviewed. No pertinent past medical history.  Patient Active Problem List  Diagnosis   Secondary hyperparathyroidism of renal origin (CMS-HCC)   History reviewed. No pertinent surgical history.   Not on File  No current outpatient medications on file prior to visit.   No current facility-administered medications on file prior to visit.   History reviewed. No  pertinent family history.   Social History   Tobacco Use  Smoking Status Not on file  Smokeless Tobacco Not on file    Social History   Socioeconomic History   Marital status: Legally Separated   Objective:   There were no vitals filed for this visit.  There is no height or weight on file to calculate BMI.  Physical Exam   GENERAL APPEARANCE Development: normal Nutritional status: normal Gross deformities: none  SKIN Rash, lesions, ulcers: none Induration, erythema: none Nodules: none palpable  EYES Conjunctiva and lids: normal Pupils: equal and reactive Iris: normal bilaterally  EARS, NOSE, MOUTH, THROAT External ears: no lesion or deformity External nose: no lesion or deformity Hearing: grossly normal Due to Covid-19 pandemic, patient is wearing a mask.  NECK Symmetric: yes Trachea: midline Thyroid: no palpable nodules in the thyroid bed  CHEST Respiratory effort: normal Retraction or accessory muscle use: no Breath sounds: normal bilaterally Rales, rhonchi, wheeze: none  CARDIOVASCULAR Auscultation: regular rhythm, normal rate Murmurs: none Pulses: radial pulse 2+ palpable Lower extremity edema: none  MUSCULOSKELETAL Station and gait: normal Digits and nails: no clubbing or cyanosis Muscle strength: grossly normal all extremities Range of motion: grossly normal all extremities Deformity: none Vascular access right upper arm  LYMPHATIC Cervical: none palpable Supraclavicular: none palpable  PSYCHIATRIC Oriented to person, place, and time: yes Mood and affect: normal for situation Judgment and insight: appropriate for situation  Assessment and Plan:   Secondary hyperparathyroidism of renal origin (CMS-HCC)   Patient is referred by Dr. Edrick Oh from nephrology for surgical evaluation and management of secondary hyperparathyroidism. Patient has biochemical evidence of secondary hyperparathyroidism. She apparently failed management  with Sensipar.  Today we discussed proceeding with total parathyroidectomy with autotransplantation to the left forearm. We  discussed the size and location of the surgical incisions. We discussed the risk and benefits of the procedure. We discussed the hospital stay to be anticipated. We discussed her postoperative recovery and return to work and normal activities. She understands and agrees to proceed.  The risks and benefits of the procedure have been discussed at length with the patient. The patient understands the proposed procedure, potential alternative treatments, and the course of recovery to be expected. All of the patient's questions have been answered at this time. The patient wishes to proceed with surgery.   Armandina Gemma, MD [facility] [place] practice Office: [phone]

## 2021-11-06 DIAGNOSIS — Z992 Dependence on renal dialysis: Secondary | ICD-10-CM | POA: Diagnosis not present

## 2021-11-06 DIAGNOSIS — N2581 Secondary hyperparathyroidism of renal origin: Secondary | ICD-10-CM | POA: Diagnosis not present

## 2021-11-06 DIAGNOSIS — N186 End stage renal disease: Secondary | ICD-10-CM | POA: Diagnosis not present

## 2021-11-06 DIAGNOSIS — L299 Pruritus, unspecified: Secondary | ICD-10-CM | POA: Diagnosis not present

## 2021-11-06 NOTE — Pre-Procedure Instructions (Signed)
Surgical Instructions    Your procedure is scheduled on Thursday, January 19th.  Report to [facility] Main Entrance "A" at 07:30 A.M., then check in with the Admitting office.  Call this number if you have problems the morning of surgery:  [phone]   If you have any questions prior to your surgery date call [phone]: Open Monday-Friday 8am-4pm    Remember:  Do not eat or drink after midnight the night before your surgery      Take these medicines the morning of surgery with A SIP OF WATER  levothyroxine (SYNTHROID)  As of today, STOP taking any Aspirin (unless otherwise instructed by your surgeon) Aleve, Naproxen, Ibuprofen, Motrin, Advil, Goody's, BC's, all herbal medications, fish oil, and all vitamins.                     Do NOT Smoke (Tobacco/Vaping) or drink Alcohol 24 hours prior to your procedure.  If you use a CPAP at night, you may bring all equipment for your overnight stay.   Contacts, glasses, piercing's, hearing aid's, dentures or partials may not be worn into surgery, please bring cases for these belongings.    For patients admitted to the hospital, discharge time will be determined by your treatment team.   Patients discharged the day of surgery will not be allowed to drive home, and someone needs to stay with them for 24 hours.  NO VISITORS WILL BE ALLOWED IN PRE-OP WHERE PATIENTS GET READY FOR SURGERY.  ONLY 1 SUPPORT PERSON MAY BE PRESENT IN THE WAITING ROOM WHILE YOU ARE IN SURGERY.  IF YOU ARE TO BE ADMITTED, ONCE YOU ARE IN YOUR ROOM YOU WILL BE ALLOWED TWO (2) VISITORS.  Minor children may have two parents present. Special consideration for safety and communication needs will be reviewed on a case by case basis.   Special instructions:   [place]- Preparing For Surgery  Before surgery, you can play an important role. Because skin is not sterile, your skin needs to be as free of germs as possible. You can reduce the number of germs on your skin by  washing with CHG (chlorahexidine gluconate) Soap before surgery.  CHG is an antiseptic cleaner which kills germs and bonds with the skin to continue killing germs even after washing.    Oral Hygiene is also important to reduce your risk of infection.  Remember - BRUSH YOUR TEETH THE MORNING OF SURGERY WITH YOUR REGULAR TOOTHPASTE  Please do not use if you have an allergy to CHG or antibacterial soaps. If your skin becomes reddened/irritated stop using the CHG.  Do not shave (including legs and underarms) for at least 48 hours prior to first CHG shower. It is OK to shave your face.  Please follow these instructions carefully.   Shower the NIGHT BEFORE SURGERY and the MORNING OF SURGERY  If you chose to wash your hair, wash your hair first as usual with your normal shampoo.  After you shampoo, rinse your hair and body thoroughly to remove the shampoo.  Use CHG Soap as you would any other liquid soap. You can apply CHG directly to the skin and wash gently with a scrungie or a clean washcloth.   Apply the CHG Soap to your body ONLY FROM THE NECK DOWN.  Do not use on open wounds or open sores. Avoid contact with your eyes, ears, mouth and genitals (private parts). Wash Face and genitals (private parts)  with your normal soap.   Wash  thoroughly, paying special attention to the area where your surgery will be performed.  Thoroughly rinse your body with warm water from the neck down.  DO NOT shower/wash with your normal soap after using and rinsing off the CHG Soap.  Pat yourself dry with a CLEAN TOWEL.  Wear CLEAN PAJAMAS to bed the night before surgery  Place CLEAN SHEETS on your bed the night before your surgery  DO NOT SLEEP WITH PETS.   Day of Surgery: Shower with CHG soap. Do not wear jewelry, make up, nail polish, gel polish, artificial nails, or any other type of covering on natural nails including finger and toenails. If patients have artificial nails, gel coating, etc. that  need to be removed by a nail salon please have this removed prior to surgery. Surgery may need to be canceled/delayed if the surgeon/ anesthesia feels like the patient is unable to be adequately monitored. Do not wear lotions, powders, perfumes, or deodorant. Do not shave 48 hours prior to surgery.   Do not bring valuables to the hospital. [facility] is not responsible for any belongings or valuables. Wear Clean/Comfortable clothing the morning of surgery Remember to brush your teeth WITH YOUR REGULAR TOOTHPASTE.   Please read over the following fact sheets that you were given.   3 days prior to your procedure or After your COVID test   You are not required to quarantine however you are required to wear a well-fitting mask when you are out and around people not in your household. If your mask becomes wet or soiled, replace with a new one.   Wash your hands often with soap and water for 20 seconds or clean your hands with an alcohol-based hand sanitizer that contains at least 60% alcohol.   Do not share personal items.   Notify your provider:  o if you are in close contact with someone who has COVID  o or if you develop a fever of 100.4 or greater, sneezing, cough, sore throat, shortness of breath or body aches.

## 2021-11-07 ENCOUNTER — Encounter (HOSPITAL_COMMUNITY)
Admission: RE | Admit: 2021-11-07 | Discharge: 2021-11-07 | Disposition: A | Discharge status: Home / Self Care | Payer: BC Managed Care – PPO | Source: Ambulatory Visit | Attending: Surgery | Admitting: Surgery

## 2021-11-07 ENCOUNTER — Other Ambulatory Visit: Payer: Self-pay

## 2021-11-07 ENCOUNTER — Encounter (HOSPITAL_COMMUNITY): Payer: Self-pay

## 2021-11-07 VITALS — BP 137/90 | HR 81 | Temp 98.2°F | Resp 17 | Ht <= 58 in | Wt 94.2 lb

## 2021-11-07 DIAGNOSIS — N186 End stage renal disease: Secondary | ICD-10-CM | POA: Diagnosis not present

## 2021-11-07 DIAGNOSIS — Z992 Dependence on renal dialysis: Secondary | ICD-10-CM | POA: Insufficient documentation

## 2021-11-07 DIAGNOSIS — N2581 Secondary hyperparathyroidism of renal origin: Secondary | ICD-10-CM | POA: Diagnosis not present

## 2021-11-07 DIAGNOSIS — R5382 Chronic fatigue, unspecified: Secondary | ICD-10-CM | POA: Diagnosis not present

## 2021-11-07 DIAGNOSIS — D649 Anemia, unspecified: Secondary | ICD-10-CM | POA: Insufficient documentation

## 2021-11-07 DIAGNOSIS — I120 Hypertensive chronic kidney disease with stage 5 chronic kidney disease or end stage renal disease: Secondary | ICD-10-CM | POA: Diagnosis not present

## 2021-11-07 DIAGNOSIS — Z20822 Contact with and (suspected) exposure to covid-19: Secondary | ICD-10-CM | POA: Diagnosis not present

## 2021-11-07 DIAGNOSIS — L299 Pruritus, unspecified: Secondary | ICD-10-CM | POA: Diagnosis not present

## 2021-11-07 DIAGNOSIS — Z01818 Encounter for other preprocedural examination: Secondary | ICD-10-CM | POA: Insufficient documentation

## 2021-11-07 DIAGNOSIS — Z79899 Other long term (current) drug therapy: Secondary | ICD-10-CM | POA: Diagnosis not present

## 2021-11-07 DIAGNOSIS — M898X9 Other specified disorders of bone, unspecified site: Secondary | ICD-10-CM | POA: Diagnosis not present

## 2021-11-07 DIAGNOSIS — D631 Anemia in chronic kidney disease: Secondary | ICD-10-CM | POA: Diagnosis not present

## 2021-11-07 LAB — BASIC METABOLIC PANEL
Anion gap: 16 — ABNORMAL HIGH (ref 5–15)
BUN: 53 mg/dL — ABNORMAL HIGH (ref 6–20)
CO2: 31 mmol/L (ref 22–32)
Calcium: 9.8 mg/dL (ref 8.9–10.3)
Chloride: 91 mmol/L — ABNORMAL LOW (ref 98–111)
Creatinine, Ser: 8.65 mg/dL — ABNORMAL HIGH (ref 0.44–1.00)
GFR, Estimated: 6 mL/min — ABNORMAL LOW (ref >60)
Glucose, Bld: 101 mg/dL — ABNORMAL HIGH (ref 70–99)
Potassium: 4.8 mmol/L (ref 3.5–5.1)
Sodium: 138 mmol/L (ref 135–145)

## 2021-11-07 LAB — CBC
HCT: 35.2 % — ABNORMAL LOW (ref 36.0–46.0)
Hemoglobin: 11.3 g/dL — ABNORMAL LOW (ref 12.0–15.0)
MCH: 29.7 pg (ref 26.0–34.0)
MCHC: 32.1 g/dL (ref 30.0–36.0)
MCV: 92.6 fL (ref 80.0–100.0)
Platelets: 209 10*3/uL (ref 150–400)
RBC: 3.8 MIL/uL — ABNORMAL LOW (ref 3.87–5.11)
RDW: 17.2 % — ABNORMAL HIGH (ref 11.5–15.5)
WBC: 5.6 10*3/uL (ref 4.0–10.5)
nRBC: 0 % (ref 0.0–0.2)

## 2021-11-07 LAB — SARS CORONAVIRUS 2 (TAT 6-24 HRS): SARS Coronavirus 2: NEGATIVE

## 2021-11-07 NOTE — Progress Notes (Addendum)
PCP - [facility]- Last seen by- Ezequiel Essex Nephrologist- Dr. Edrick Oh. Dialysis M [facility] Cardiologist - denies  PPM/ICD - n/a Device Orders - n/a Rep Notified - n/a  Chest x-ray - CT Chest 11/02/21- C.E. EKG - 11/07/21 Stress Test - 11/02/21 ECHO - 08/31/21 Cardiac Cath - denies  Sleep Study - denies CPAP - n/a  Fasting Blood Sugar - n/a Checks Blood Sugar _____ times a day- n/a  Blood Thinner Instructions: n/a Aspirin Instructions: n/a  ERAS Protcol - Yes PRE-SURGERY Ensure or G2- None ordered  COVID TEST- 11/07/21. Pending.    Anesthesia review: Yes.   Patient denies shortness of breath, fever, cough and chest pain at PAT appointment   All instructions explained to the patient, with a verbal understanding of the material. Patient agrees to go over the instructions while at home for a better understanding. The opportunity to ask questions was provided. Interpreter was present throughout visit.

## 2021-11-07 NOTE — Pre-Procedure Instructions (Signed)
Surgical Instructions    Your procedure is scheduled on Thursday 11/09/21.   Report to [facility] Main Entrance "A" at 07:30 A.M., then check in with the Admitting office.  Call this number if you have problems the morning of surgery:  [phone]   If you have any questions prior to your surgery date call [phone]: Open Monday-Friday 8am-4pm    Remember:  Do not eat after midnight the night before your surgery  You may drink clear liquids until 06:30 A.M. the morning of your surgery.   Clear liquids allowed are: Water, Non-Citrus Juices (without pulp), Carbonated Beverages, Clear Tea, Black Coffee Only, and Gatorade    Take these medicines the morning of surgery with A SIP OF WATER   levothyroxine (SYNTHROID)  As of today, STOP taking any Aspirin (unless otherwise instructed by your surgeon) Aleve, Naproxen, Ibuprofen, Motrin, Advil, Goody's, BC's, all herbal medications, fish oil, and all vitamins.                     Do NOT Smoke (Tobacco/Vaping) or drink Alcohol 24 hours prior to your procedure.  If you use a CPAP at night, you may bring all equipment for your overnight stay.   Contacts, glasses, piercing's, hearing aid's, dentures or partials may not be worn into surgery, please bring cases for these belongings.    For patients admitted to the hospital, discharge time will be determined by your treatment team.   Patients discharged the day of surgery will not be allowed to drive home, and someone needs to stay with them for 24 hours.  NO VISITORS WILL BE ALLOWED IN PRE-OP WHERE PATIENTS GET READY FOR SURGERY.  ONLY 1 SUPPORT PERSON MAY BE PRESENT IN THE WAITING ROOM WHILE YOU ARE IN SURGERY.  IF YOU ARE TO BE ADMITTED, ONCE YOU ARE IN YOUR ROOM YOU WILL BE ALLOWED TWO (2) VISITORS.  Minor children may have two parents present. Special consideration for safety and communication needs will be reviewed on a case by case basis.   Special instructions:   [place]-  Preparing For Surgery  Before surgery, you can play an important role. Because skin is not sterile, your skin needs to be as free of germs as possible. You can reduce the number of germs on your skin by washing with CHG (chlorahexidine gluconate) Soap before surgery.  CHG is an antiseptic cleaner which kills germs and bonds with the skin to continue killing germs even after washing.    Oral Hygiene is also important to reduce your risk of infection.  Remember - BRUSH YOUR TEETH THE MORNING OF SURGERY WITH YOUR REGULAR TOOTHPASTE  Please do not use if you have an allergy to CHG or antibacterial soaps. If your skin becomes reddened/irritated stop using the CHG.  Do not shave (including legs and underarms) for at least 48 hours prior to first CHG shower. It is OK to shave your face.  Please follow these instructions carefully.   Shower the NIGHT BEFORE SURGERY and the MORNING OF SURGERY  If you chose to wash your hair, wash your hair first as usual with your normal shampoo.  After you shampoo, rinse your hair and body thoroughly to remove the shampoo.  Use CHG Soap as you would any other liquid soap. You can apply CHG directly to the skin and wash gently with a scrungie or a clean washcloth.   Apply the CHG Soap to your body ONLY FROM THE NECK DOWN.  Do not use  on open wounds or open sores. Avoid contact with your eyes, ears, mouth and genitals (private parts). Wash Face and genitals (private parts)  with your normal soap.   Wash thoroughly, paying special attention to the area where your surgery will be performed.  Thoroughly rinse your body with warm water from the neck down.  DO NOT shower/wash with your normal soap after using and rinsing off the CHG Soap.  Pat yourself dry with a CLEAN TOWEL.  Wear CLEAN PAJAMAS to bed the night before surgery  Place CLEAN SHEETS on your bed the night before your surgery  DO NOT SLEEP WITH PETS.   Day of Surgery: Shower with CHG soap. Do not  wear jewelry, make up, nail polish, gel polish, artificial nails, or any other type of covering on natural nails including finger and toenails. If patients have artificial nails, gel coating, etc. that need to be removed by a nail salon please have this removed prior to surgery. Surgery may need to be canceled/delayed if the surgeon/ anesthesia feels like the patient is unable to be adequately monitored. Do not wear lotions, powders, perfumes/colognes, or deodorant. Do not shave 48 hours prior to surgery.  Men may shave face and neck. Do not bring valuables to the hospital. [facility] is not responsible for any belongings or valuables. Wear Clean/Comfortable clothing the morning of surgery Remember to brush your teeth WITH YOUR REGULAR TOOTHPASTE.   Please read over the following fact sheets that you were given.   3 days prior to your procedure or After your COVID test   You are not required to quarantine however you are required to wear a well-fitting mask when you are out and around people not in your household. If your mask becomes wet or soiled, replace with a new one.   Wash your hands often with soap and water for 20 seconds or clean your hands with an alcohol-based hand sanitizer that contains at least 60% alcohol.   Do not share personal items.   Notify your provider:  o if you are in close contact with someone who has COVID  o or if you develop a fever of 100.4 or greater, sneezing, cough, sore throat, shortness of breath or body aches.

## 2021-11-08 ENCOUNTER — Encounter (HOSPITAL_COMMUNITY): Payer: Self-pay

## 2021-11-08 DIAGNOSIS — N186 End stage renal disease: Secondary | ICD-10-CM | POA: Diagnosis not present

## 2021-11-08 DIAGNOSIS — Z992 Dependence on renal dialysis: Secondary | ICD-10-CM | POA: Diagnosis not present

## 2021-11-08 DIAGNOSIS — N2581 Secondary hyperparathyroidism of renal origin: Secondary | ICD-10-CM | POA: Diagnosis not present

## 2021-11-08 DIAGNOSIS — L299 Pruritus, unspecified: Secondary | ICD-10-CM | POA: Diagnosis not present

## 2021-11-08 NOTE — Progress Notes (Signed)
Anesthesia Chart Review:  Case: [id number] Date/Time: 11/09/21 0915   Procedures:      TOTAL PARATHYROIDECTOMY     THYROID AUTOTRANSPLANT   Anesthesia type: General   Pre-op diagnosis: SECONDARY HYPERPARATHYROIDISM DUE TO RENAL DISEASE   Location: [facility] OR ROOM 09 / [place] [facility]   Surgeons: Armandina Gemma, MD       DISCUSSION: Patient is a 36 year old [place] speaking female scheduled for the above procedure.  History includes never smoker, post-operative N/V, HTN, anemia, ESRD (due IgA nephropathy, glomerulonephritis; intermittent dialysis initiated ~ 2014, resumed 03/2015; HD MWF [facility], RUE AVF) thrombocytopenia (in setting of Enterobacter sepsis 07/2015), secondary hyperparathyroidism. She is also on levothyroxine.   11/08/21 presurgical COVID-19 test negative. Anesthesia team to evaluate on the day of surgery. She is for ISTAT and pregnancy test on arrival.   VS: BP 137/90    Pulse 81    Temp 36.8 C (Oral)    Resp 17    Ht 4\' 7"  (1.397 m)    Wt 42.7 kg    LMP 10/31/2021 (Exact Date) Comment: Tubal ligation 2014   SpO2 100%    BMI 21.89 kg/m    PROVIDERS: Lattie Haw, MD is listed as PCP ([facility]) Edrick Oh, MD is nephrologist. She is also on undergoing renal transplant evaluation through [facility].   LABS: 11/07/21 labs noted. She is on hemodialysis, so will also get an ISTAT on arrival for surgery. AST 21, ALT 23, A1c 4.9% on 09/20/21 (CE). (all labs ordered are listed, but only abnormal results are displayed)  Labs Reviewed  BASIC METABOLIC PANEL - Abnormal; Notable for the following components:      Result Value   Chloride 91 (*)    Glucose, Bld 101 (*)    BUN 53 (*)    Creatinine, Ser 8.65 (*)    GFR, Estimated 6 (*)    Anion gap 16 (*)    All other components within normal limits  CBC - Abnormal; Notable for the following components:   RBC 3.80 (*)    Hemoglobin 11.3 (*)    HCT 35.2 (*)    RDW 17.2 (*)    All other components within normal  limits  SARS CORONAVIRUS 2 (TAT 6-24 HRS)    IMAGES: CT Chest 11/02/21 ([facility]): FINDINGS:  Thoracic inlet/central airways: Thyroid normal. Airway patent.  Mediastinum/hila/axilla: No adenopathy.  Heart/vessels: Normal heart size. No pericardial effusion. Aorta normal in caliber.  Lungs/pleura: No pulmonary parenchymal disease, pleural effusion, or pneumothorax. Mild interstitial opacities within the left lower lobe suggests atelectasis or scarring. No discrete nodules are seen.  Upper abdomen: Unremarkable.  Chest wall/MSK: There is a right-sided subclavian vein stent.. Impression: No evidence for suspicious lung nodules.  CT Abd/pelvis 09/20/22 ([facility]): Impression: 1.  No acute findings.  2.  Renal measurements as above.  3.  Questionable minimal calcified atherosclerosis in the left external iliac artery. No significant burden of calcified atherosclerosis in the right external iliac artery.  4.  Incompletely imaged tiny nodule in the right upper lobe, nonspecific, though could be infectious/inflammatory in nature.   EKG: 11/07/21: NSR   CV: Exercise Stress echo 11/02/21 ([facility]): STRESS ECHO  Normal left ventricular function and global wall motion with stress.  There were no segmental wall motion abnormalities post exercise. There  was normal increase in global LV function post exercise. The estimated  LV ejection fraction is >70% with stress. Negative exercise  echocardiography for inducible ischemia at  target heart rate.   Echo 08/31/21 ([facility]): SUMMARY  The left ventricular size is normal.  LV ejection fraction = 55-60%.  Left ventricular systolic function is normal.  The right ventricle is normal in size and function.  There is mild mitral regurgitation.  There is mild tricuspid regurgitation.  No significant stenosis seen  Estimated right ventricular systolic pressure is 26 mmHg.  The IVC is normal in size with an inspiratory collapse of greater then   50%, suggesting normal right atrial pressure.  There is no pericardial effusion.  There is no comparison study available.    Past Medical History:  Diagnosis Date   Anemia    Depression    Dysuria 11/04/2018   ESRD (end stage renal disease) ([place])    from IgA nephritis   Glomerulonephritis    Hemodialysis patient ([place])    Tu, Th, Sat   Hypertension    Menorrhagia with regular cycle 11/04/2018   Normocytic anemia 08/02/2015   PONV (postoperative nausea and vomiting)    was on dialysis i during pregnancy2014   Swelling of right side of face 11/01/2017   Thrombocytopenia ([place]) 2014    Past Surgical History:  Procedure Laterality Date   A/V FISTULAGRAM N/A 01/08/2018   Procedure: A/V FISTULAGRAM - right arm;  Surgeon: [name], MD;  Location: [place] [facility];  Service: Cardiovascular;  Laterality: N/A;   AV FISTULA PLACEMENT Left    AV FISTULA PLACEMENT Right 04/14/2015   Procedure: CREATION OF RIGHT RADIOCEPHALIC ARTERIOVENOUS (AV) FISTULA ;  Surgeon: Angelia Mould, MD;  Location: [place];  Service: Vascular;  Laterality: Right;   AV FISTULA PLACEMENT Right 11/22/2016   Procedure: ARTERIOVENOUS (AV) FISTULA CREATION;  Surgeon: [name], MD;  Location: [place];  Service: Vascular;  Laterality: Right;   CESAREAN SECTION     2009   ESOPHAGOGASTRODUODENOSCOPY N/A 11/24/2016   Procedure: ESOPHAGOGASTRODUODENOSCOPY (EGD);  Surgeon: [name], MD;  Location: [facility] END[facility]COPY;  Service: Endoscopy;  Laterality: N/A;   EXCHANGE OF A DIALYSIS CATHETER Right 04/14/2015   Procedure: EXCHANGE OF RIGHT INTERNAL JUGULAR DIALYSIS CATHETER;  Surgeon: Angelia Mould, MD;  Location: [place];  Service: Vascular;  Laterality: Right;   INSERTION OF DIALYSIS CATHETER Left 11/22/2016   Procedure: INSERTION OF DIALYSIS CATHETER - LEFT INTERNAL JUGULAR PLACEMENT;  Surgeon: [name], MD;  Location: [place];  Service: Vascular;  Laterality: Left;   IR AV  DIALY SHUNT INTRO NEEDLE/INTRAC INITIAL W/PTA/STENT/IMG RT Right 02/15/2020   IR AV DIALY SHUNT INTRO NEEDLE/INTRACATH INITIAL W/PTA/IMG LEFT  10/18/2020   IR DIALY SHUNT INTRO NEEDLE/INTRACATH INITIAL W/IMG RIGHT Right 07/26/2017   IR DIALY SHUNT INTRO NEEDLE/INTRACATH INITIAL W/IMG RIGHT Right 10/18/2017   IR DIALY SHUNT INTRO NEEDLE/INTRACATH INITIAL W/IMG RIGHT Right 03/10/2018   IR DIALY SHUNT INTRO NEEDLE/INTRACATH INITIAL W/IMG RIGHT Right 12/30/2018   IR PTA ADDL CENTRAL DIALYSIS SEG THRU DIALY CIRCUIT LEFT Left 10/18/2020   IR RADIOLOGIST EVAL & MGMT  07/02/2019   IR REMOVAL TUN CV CATH W/O FL  03/06/2017   IR TRANSCATH PLC STENT 1ST ART NOT LE CV CAR VERT CAR  12/30/2018   IR US GUIDE VASC ACCESS RIGHT  12/30/2018   IR US GUIDE VASC ACCESS RIGHT  12/30/2018   IR US GUIDE VASC ACCESS RIGHT  02/15/2020   IR VENO/EXT/UNI LEFT  12/30/2018   LIGATION OF ARTERIOVENOUS  FISTULA Left 11/05/2014   Procedure: LIGATION OF LEFT ARM  BRACHIO-CEPHALIC ARTERIOVENOUS  FISTULA ,&  REPAIR OF BRACHIAL ARTERY.;  Surgeon: Mal [name], MD;  Location: [place];  Service: Vascular;  Laterality: Left;   PERIPHERAL VASCULAR BALLOON ANGIOPLASTY  01/08/2018   Procedure: PERIPHERAL VASCULAR BALLOON ANGIOPLASTY;  Surgeon: [name], MD;  Location: [place] CV LAB;  Service: Cardiovascular;;  innominate vein   THROMBECTOMY W/ EMBOLECTOMY Right 11/16/2016   Procedure: THROMBECTOMY REVISION OF ARTERIOVENOUS FISTULA - RIGHT ARM;  Surgeon: [name], MD;  Location: [facility] OR;  Service: Vascular;  Laterality: Right;   TUBAL LIGATION  2014    MEDICATIONS:  amLODipine (NORVASC) 10 MG tablet   B Complex Vitamins (B-COMPLEX/B-12) TABS   B Complex-C-Zn-Folic Acid (DIALYVITE 813 WITH ZINC) 0.8 MG TABS   diphenhydrAMINE (BENADRYL) 25 MG tablet   ferric citrate (AURYXIA) 1 GM 210 MG(Fe) tablet   ferrous sulfate 325 (65 FE) MG tablet   levothyroxine (SYNTHROID) 50 MCG tablet   megestrol (MEGACE) 40 MG tablet    Multiple Vitamin (MULTIVITAMIN WITH MINERALS) TABS tablet   No current facility-administered medications for this encounter.    Myra Gianotti, PA-C Surgical Short Stay/Anesthesiology [facility] Phone [phone] [facility] Phone [phone] 11/08/2021 11:04 AM

## 2021-11-08 NOTE — Anesthesia Preprocedure Evaluation (Addendum)
Anesthesia Evaluation  Patient identified by MRN, date of birth, ID band Patient awake    Reviewed: Allergy & Precautions, NPO status , Patient's Chart, lab work & pertinent test results  History of Anesthesia Complications Negative for: history of anesthetic complications  Airway Mallampati: III  TM Distance: >3 FB Neck ROM: Full    Dental  (+) Teeth Intact, Dental Advisory Given   Pulmonary neg pulmonary ROS,    Pulmonary exam normal        Cardiovascular hypertension, Normal cardiovascular exam     Neuro/Psych negative neurological ROS     GI/Hepatic Neg liver ROS, GERD  ,  Endo/Other  Hypothyroidism Secondary hyperparathyroidism  Renal/GU ESRF and DialysisRenal disease  negative genitourinary   Musculoskeletal negative musculoskeletal ROS (+)   Abdominal   Peds  Hematology  (+) anemia ,   Anesthesia Other Findings   Reproductive/Obstetrics                           Anesthesia Physical Anesthesia Plan  ASA: 3  Anesthesia Plan: General   Post-op Pain Management: Tylenol PO (pre-op)   Induction: Intravenous  PONV Risk Score and Plan: 3 and Ondansetron, Dexamethasone, Treatment may vary due to age or medical condition and Midazolam  Airway Management Planned: Oral ETT  Additional Equipment: None  Intra-op Plan:   Post-operative Plan: Extubation in OR  Informed Consent: I have reviewed the patients History and Physical, chart, labs and discussed the procedure including the risks, benefits and alternatives for the proposed anesthesia with the patient or authorized representative who has indicated his/her understanding and acceptance.     Dental advisory given  Plan Discussed with:   Anesthesia Plan Comments: (PAT note written 11/08/2021 by Myra Gianotti, PA-C. )       Anesthesia Quick Evaluation

## 2021-11-09 ENCOUNTER — Inpatient Hospital Stay (HOSPITAL_COMMUNITY)
Admission: RE | Admit: 2021-11-09 | Discharge: 2021-11-14 | DRG: 674 | Disposition: A | Discharge status: Home / Self Care | Payer: BC Managed Care – PPO | Attending: Surgery | Admitting: Surgery

## 2021-11-09 ENCOUNTER — Other Ambulatory Visit: Payer: Self-pay

## 2021-11-09 ENCOUNTER — Encounter (HOSPITAL_COMMUNITY)
Admission: RE | Disposition: A | Discharge status: Home / Self Care | Payer: Self-pay | Source: Home / Self Care | Attending: Surgery

## 2021-11-09 ENCOUNTER — Inpatient Hospital Stay (HOSPITAL_COMMUNITY): Payer: BC Managed Care – PPO | Admitting: Anesthesiology

## 2021-11-09 ENCOUNTER — Encounter (HOSPITAL_COMMUNITY): Payer: Self-pay | Admitting: Surgery

## 2021-11-09 ENCOUNTER — Inpatient Hospital Stay (HOSPITAL_COMMUNITY): Payer: BC Managed Care – PPO | Admitting: Vascular Surgery

## 2021-11-09 DIAGNOSIS — Z20822 Contact with and (suspected) exposure to covid-19: Secondary | ICD-10-CM | POA: Diagnosis present

## 2021-11-09 DIAGNOSIS — I120 Hypertensive chronic kidney disease with stage 5 chronic kidney disease or end stage renal disease: Secondary | ICD-10-CM | POA: Diagnosis present

## 2021-11-09 DIAGNOSIS — D631 Anemia in chronic kidney disease: Secondary | ICD-10-CM | POA: Diagnosis present

## 2021-11-09 DIAGNOSIS — R5382 Chronic fatigue, unspecified: Secondary | ICD-10-CM | POA: Diagnosis present

## 2021-11-09 DIAGNOSIS — N186 End stage renal disease: Secondary | ICD-10-CM | POA: Diagnosis not present

## 2021-11-09 DIAGNOSIS — Z992 Dependence on renal dialysis: Secondary | ICD-10-CM | POA: Diagnosis not present

## 2021-11-09 DIAGNOSIS — N2581 Secondary hyperparathyroidism of renal origin: Principal | ICD-10-CM | POA: Diagnosis present

## 2021-11-09 DIAGNOSIS — Z79899 Other long term (current) drug therapy: Secondary | ICD-10-CM | POA: Diagnosis not present

## 2021-11-09 DIAGNOSIS — M898X9 Other specified disorders of bone, unspecified site: Secondary | ICD-10-CM | POA: Diagnosis present

## 2021-11-09 HISTORY — PX: PARATHYROIDECTOMY: SHX19

## 2021-11-09 LAB — CALCIUM: Calcium: 8.5 mg/dL — ABNORMAL LOW (ref 8.9–10.3)

## 2021-11-09 LAB — HEPATITIS B SURFACE ANTIGEN: Hepatitis B Surface Ag: NONREACTIVE

## 2021-11-09 LAB — POCT I-STAT, CHEM 8
BUN: 40 mg/dL — ABNORMAL HIGH (ref 6–20)
Calcium, Ion: 0.93 mmol/L — ABNORMAL LOW (ref 1.15–1.40)
Chloride: 100 mmol/L (ref 98–111)
Creatinine, Ser: 8 mg/dL — ABNORMAL HIGH (ref 0.44–1.00)
Glucose, Bld: 87 mg/dL (ref 70–99)
HCT: 37 % (ref 36.0–46.0)
Hemoglobin: 12.6 g/dL (ref 12.0–15.0)
Potassium: 4.9 mmol/L (ref 3.5–5.1)
Sodium: 134 mmol/L — ABNORMAL LOW (ref 135–145)
TCO2: 29 mmol/L (ref 22–32)

## 2021-11-09 LAB — HEPATITIS B SURFACE ANTIBODY,QUALITATIVE: Hep B S Ab: REACTIVE — AB

## 2021-11-09 LAB — HCG, SERUM, QUALITATIVE: Preg, Serum: NEGATIVE

## 2021-11-09 SURGERY — PARATHYROIDECTOMY
Anesthesia: General | Site: Neck

## 2021-11-09 MED ORDER — ACETAMINOPHEN 650 MG RE SUPP: 650.0000 mg | Freq: Four times a day (QID) | RECTAL | Status: DC | PRN

## 2021-11-09 MED ORDER — CHLORHEXIDINE GLUCONATE CLOTH 2 % EX PADS: 6.0000 | MEDICATED_PAD | Freq: Once | CUTANEOUS | Status: DC

## 2021-11-09 MED ORDER — LIDOCAINE HCL (PF) 1 % IJ SOLN: 5.0000 mL | INTRAMUSCULAR | Status: DC | PRN

## 2021-11-09 MED ORDER — PHENOL 1.4 % MT LIQD
1.0000 | OROMUCOSAL | Status: DC | PRN
  Administered 2021-11-09 – 2021-11-12 (×2): 1 via OROMUCOSAL
  Filled 2021-11-09: qty 177

## 2021-11-09 MED ORDER — ONDANSETRON HCL 4 MG/2ML IJ SOLN: 4.0000 mg | Freq: Once | INTRAMUSCULAR | Status: DC | PRN

## 2021-11-09 MED ORDER — HEPARIN SODIUM (PORCINE) 1000 UNIT/ML DIALYSIS: 20.0000 [IU]/kg | INTRAMUSCULAR | Status: DC | PRN

## 2021-11-09 MED ORDER — SUGAMMADEX SODIUM 200 MG/2ML IV SOLN
INTRAVENOUS | Status: DC | PRN
  Administered 2021-11-09: 200 mg via INTRAVENOUS

## 2021-11-09 MED ORDER — PENTAFLUOROPROP-TETRAFLUOROETH EX AERO: 1.0000 "application " | INHALATION_SPRAY | CUTANEOUS | Status: DC | PRN

## 2021-11-09 MED ORDER — OXYCODONE HCL 5 MG PO TABS
5.0000 mg | ORAL_TABLET | ORAL | Status: DC | PRN
  Administered 2021-11-10: 10 mg via ORAL
  Administered 2021-11-10: 5 mg via ORAL
  Administered 2021-11-10 – 2021-11-12 (×5): 10 mg via ORAL
  Administered 2021-11-13: 5 mg via ORAL
  Administered 2021-11-14: 16:00:00 10 mg via ORAL
  Filled 2021-11-09 (×3): qty 2
  Filled 2021-11-09: qty 1
  Filled 2021-11-09 (×4): qty 2
  Filled 2021-11-09: qty 1
  Filled 2021-11-09: qty 2

## 2021-11-09 MED ORDER — FERRIC CITRATE 1 GM 210 MG(FE) PO TABS
630.0000 mg | ORAL_TABLET | Freq: Three times a day (TID) | ORAL | Status: DC
  Administered 2021-11-09 – 2021-11-12 (×9): 630 mg via ORAL
  Filled 2021-11-09 (×12): qty 3

## 2021-11-09 MED ORDER — MIDAZOLAM HCL 2 MG/2ML IJ SOLN
INTRAMUSCULAR | Status: AC
  Filled 2021-11-09: qty 2

## 2021-11-09 MED ORDER — SODIUM CHLORIDE 0.9 % IV SOLN: 100.0000 mL | INTRAVENOUS | Status: DC | PRN

## 2021-11-09 MED ORDER — ALTEPLASE 2 MG IJ SOLR: 2.0000 mg | Freq: Once | INTRAMUSCULAR | Status: DC | PRN

## 2021-11-09 MED ORDER — PROPOFOL 10 MG/ML IV BOLUS
INTRAVENOUS | Status: AC
  Filled 2021-11-09: qty 20

## 2021-11-09 MED ORDER — SODIUM CHLORIDE 0.9 % IV SOLN: INTRAVENOUS | Status: DC

## 2021-11-09 MED ORDER — HYDROMORPHONE HCL 1 MG/ML IJ SOLN
1.0000 mg | INTRAMUSCULAR | Status: DC | PRN
  Administered 2021-11-09 – 2021-11-14 (×13): 1 mg via INTRAVENOUS
  Filled 2021-11-09 (×14): qty 1

## 2021-11-09 MED ORDER — ONDANSETRON 4 MG PO TBDP
4.0000 mg | ORAL_TABLET | Freq: Four times a day (QID) | ORAL | Status: DC | PRN
  Administered 2021-11-09 – 2021-11-12 (×2): 4 mg via ORAL
  Filled 2021-11-09 (×2): qty 1

## 2021-11-09 MED ORDER — OXYCODONE HCL 5 MG PO TABS: 5.0000 mg | ORAL_TABLET | Freq: Once | ORAL | Status: DC | PRN

## 2021-11-09 MED ORDER — ONDANSETRON HCL 4 MG/2ML IJ SOLN
4.0000 mg | Freq: Four times a day (QID) | INTRAMUSCULAR | Status: DC | PRN
  Administered 2021-11-10: 4 mg via INTRAVENOUS
  Filled 2021-11-09: qty 2

## 2021-11-09 MED ORDER — ACETAMINOPHEN 10 MG/ML IV SOLN
1000.0000 mg | Freq: Once | INTRAVENOUS | Status: AC
  Administered 2021-11-09: 1000 mg via INTRAVENOUS
  Filled 2021-11-09: qty 100

## 2021-11-09 MED ORDER — LIDOCAINE-PRILOCAINE 2.5-2.5 % EX CREA: 1.0000 "application " | TOPICAL_CREAM | CUTANEOUS | Status: DC | PRN

## 2021-11-09 MED ORDER — FERRIC CITRATE 1 GM 210 MG(FE) PO TABS
420.0000 mg | ORAL_TABLET | Freq: Three times a day (TID) | ORAL | Status: DC | PRN
  Filled 2021-11-09: qty 2

## 2021-11-09 MED ORDER — CALCITRIOL 0.5 MCG PO CAPS
0.5000 ug | ORAL_CAPSULE | Freq: Every day | ORAL | Status: DC
  Administered 2021-11-09 – 2021-11-10 (×2): 0.5 ug via ORAL
  Filled 2021-11-09 (×2): qty 1

## 2021-11-09 MED ORDER — CEFAZOLIN SODIUM-DEXTROSE 2-4 GM/100ML-% IV SOLN
INTRAVENOUS | Status: AC
  Filled 2021-11-09: qty 100

## 2021-11-09 MED ORDER — TRAMADOL HCL 50 MG PO TABS
50.0000 mg | ORAL_TABLET | Freq: Four times a day (QID) | ORAL | Status: DC | PRN
  Administered 2021-11-09 – 2021-11-10 (×2): 50 mg via ORAL
  Filled 2021-11-09 (×3): qty 1

## 2021-11-09 MED ORDER — ACETAMINOPHEN 500 MG PO TABS: 1000.0000 mg | ORAL_TABLET | Freq: Once | ORAL | Status: AC

## 2021-11-09 MED ORDER — ONDANSETRON HCL 4 MG/2ML IJ SOLN
INTRAMUSCULAR | Status: DC | PRN
  Administered 2021-11-09: 4 mg via INTRAVENOUS

## 2021-11-09 MED ORDER — HEMOSTATIC AGENTS (NO CHARGE) OPTIME
TOPICAL | Status: DC | PRN
Start: 2021-11-09 — End: 2021-11-09
  Administered 2021-11-09: 1 via TOPICAL

## 2021-11-09 MED ORDER — LIDOCAINE 2% (20 MG/ML) 5 ML SYRINGE
INTRAMUSCULAR | Status: DC | PRN
  Administered 2021-11-09: 60 mg via INTRAVENOUS

## 2021-11-09 MED ORDER — LIDOCAINE 2% (20 MG/ML) 5 ML SYRINGE
INTRAMUSCULAR | Status: AC
  Filled 2021-11-09: qty 5

## 2021-11-09 MED ORDER — LEVOTHYROXINE SODIUM 50 MCG PO TABS
50.0000 ug | ORAL_TABLET | Freq: Every day | ORAL | Status: DC
  Administered 2021-11-10 – 2021-11-14 (×5): 50 ug via ORAL
  Filled 2021-11-09 (×5): qty 1

## 2021-11-09 MED ORDER — OXYCODONE HCL 5 MG/5ML PO SOLN: 5.0000 mg | Freq: Once | ORAL | Status: DC | PRN

## 2021-11-09 MED ORDER — HEPARIN SODIUM (PORCINE) 1000 UNIT/ML DIALYSIS: 1000.0000 [IU] | INTRAMUSCULAR | Status: DC | PRN

## 2021-11-09 MED ORDER — AMLODIPINE BESYLATE 10 MG PO TABS
10.0000 mg | ORAL_TABLET | Freq: Every day | ORAL | Status: DC
  Administered 2021-11-09 – 2021-11-13 (×5): 10 mg via ORAL
  Filled 2021-11-09 (×5): qty 1

## 2021-11-09 MED ORDER — MIDAZOLAM HCL 5 MG/5ML IJ SOLN
INTRAMUSCULAR | Status: DC | PRN
  Administered 2021-11-09: 2 mg via INTRAVENOUS

## 2021-11-09 MED ORDER — CHLORHEXIDINE GLUCONATE 0.12 % MT SOLN
OROMUCOSAL | Status: AC
  Administered 2021-11-09: 15 mL via OROMUCOSAL
  Filled 2021-11-09: qty 15

## 2021-11-09 MED ORDER — FENTANYL CITRATE (PF) 250 MCG/5ML IJ SOLN
INTRAMUSCULAR | Status: DC | PRN
  Administered 2021-11-09: 50 ug via INTRAVENOUS
  Administered 2021-11-09: 150 ug via INTRAVENOUS
  Administered 2021-11-09: 50 ug via INTRAVENOUS

## 2021-11-09 MED ORDER — CHLORHEXIDINE GLUCONATE CLOTH 2 % EX PADS
6.0000 | MEDICATED_PAD | Freq: Every day | CUTANEOUS | Status: DC
  Administered 2021-11-10 – 2021-11-14 (×5): 6 via TOPICAL

## 2021-11-09 MED ORDER — ACETAMINOPHEN 325 MG PO TABS: 650.0000 mg | ORAL_TABLET | Freq: Four times a day (QID) | ORAL | Status: DC | PRN

## 2021-11-09 MED ORDER — PROPOFOL 10 MG/ML IV BOLUS
INTRAVENOUS | Status: DC | PRN
  Administered 2021-11-09: 200 mg via INTRAVENOUS

## 2021-11-09 MED ORDER — 0.9 % SODIUM CHLORIDE (POUR BTL) OPTIME
TOPICAL | Status: DC | PRN
Start: 2021-11-09 — End: 2021-11-09
  Administered 2021-11-09 (×2): 1000 mL

## 2021-11-09 MED ORDER — CEFAZOLIN SODIUM-DEXTROSE 2-4 GM/100ML-% IV SOLN
2.0000 g | INTRAVENOUS | Status: AC
  Administered 2021-11-09: 2 g via INTRAVENOUS

## 2021-11-09 MED ORDER — FENTANYL CITRATE (PF) 100 MCG/2ML IJ SOLN
25.0000 ug | INTRAMUSCULAR | Status: DC | PRN
  Administered 2021-11-09: 25 ug via INTRAVENOUS
  Administered 2021-11-09: 50 ug via INTRAVENOUS
  Administered 2021-11-09: 25 ug via INTRAVENOUS

## 2021-11-09 MED ORDER — FENTANYL CITRATE (PF) 250 MCG/5ML IJ SOLN
INTRAMUSCULAR | Status: AC
  Filled 2021-11-09: qty 5

## 2021-11-09 MED ORDER — DEXAMETHASONE SODIUM PHOSPHATE 10 MG/ML IJ SOLN
INTRAMUSCULAR | Status: DC | PRN
Start: 2021-11-09 — End: 2021-11-09
  Administered 2021-11-09: 10 mg via INTRAVENOUS

## 2021-11-09 MED ORDER — ROCURONIUM BROMIDE 10 MG/ML (PF) SYRINGE
PREFILLED_SYRINGE | INTRAVENOUS | Status: DC | PRN
Start: 2021-11-09 — End: 2021-11-09
  Administered 2021-11-09: 10 mg via INTRAVENOUS
  Administered 2021-11-09: 60 mg via INTRAVENOUS

## 2021-11-09 MED ORDER — ROCURONIUM BROMIDE 10 MG/ML (PF) SYRINGE
PREFILLED_SYRINGE | INTRAVENOUS | Status: AC
  Filled 2021-11-09: qty 10

## 2021-11-09 MED ORDER — CHLORHEXIDINE GLUCONATE 0.12 % MT SOLN: 15.0000 mL | Freq: Once | OROMUCOSAL | Status: AC

## 2021-11-09 MED ORDER — LACTATED RINGERS IV SOLN: INTRAVENOUS | Status: DC

## 2021-11-09 MED ORDER — ACETAMINOPHEN 500 MG PO TABS
ORAL_TABLET | ORAL | Status: AC
  Administered 2021-11-09: 1000 mg via ORAL
  Filled 2021-11-09: qty 2

## 2021-11-09 MED ORDER — ORAL CARE MOUTH RINSE: 15.0000 mL | Freq: Once | OROMUCOSAL | Status: AC

## 2021-11-09 MED ORDER — FENTANYL CITRATE (PF) 100 MCG/2ML IJ SOLN
INTRAMUSCULAR | Status: AC
  Filled 2021-11-09: qty 2

## 2021-11-09 SURGICAL SUPPLY — 58 items
ATTRACTOMAT 16X20 MAGNETIC DRP (DRAPES) ×4 IMPLANT
BAG COUNTER SPONGE SURGICOUNT (BAG) ×3 IMPLANT
BAG SURGICOUNT SPONGE COUNTING (BAG) ×1
BLADE SURG 15 STRL LF DISP TIS (BLADE) ×2 IMPLANT
BLADE SURG 15 STRL SS (BLADE) ×2
CANISTER SUCT 3000ML PPV (MISCELLANEOUS) ×4 IMPLANT
CHLORAPREP W/TINT 26 (MISCELLANEOUS) ×4 IMPLANT
CLIP TI WIDE RED SMALL 24 (CLIP) ×2 IMPLANT
CLIP VESOCCLUDE MED 6/CT (CLIP) ×4 IMPLANT
CLIP VESOCCLUDE SM WIDE 6/CT (CLIP) ×4 IMPLANT
CLOSURE WOUND 1/2 X4 (GAUZE/BANDAGES/DRESSINGS) ×1
CNTNR URN SCR LID CUP LEK RST (MISCELLANEOUS) ×2 IMPLANT
CONT SPEC 4OZ STRL OR WHT (MISCELLANEOUS) ×2
COVER MAYO STAND STRL (DRAPES) ×2 IMPLANT
COVER SURGICAL LIGHT HANDLE (MISCELLANEOUS) ×6 IMPLANT
DERMABOND ADVANCED (GAUZE/BANDAGES/DRESSINGS) ×4
DERMABOND ADVANCED .7 DNX12 (GAUZE/BANDAGES/DRESSINGS) IMPLANT
DRAPE LAPAROTOMY 100X72 PEDS (DRAPES) ×4 IMPLANT
DRAPE UTILITY W/TAPE 26X15 (DRAPES) ×4 IMPLANT
DRAPE UTILITY XL STRL (DRAPES) ×4 IMPLANT
ELECT CAUTERY BLADE 6.4 (BLADE) ×4 IMPLANT
ELECT REM PT RETURN 9FT ADLT (ELECTROSURGICAL) ×4
ELECTRODE REM PT RTRN 9FT ADLT (ELECTROSURGICAL) ×2 IMPLANT
GAUZE 4X4 16PLY ~~LOC~~+RFID DBL (SPONGE) ×4 IMPLANT
GAUZE SPONGE 2X2 8PLY STRL LF (GAUZE/BANDAGES/DRESSINGS) ×2 IMPLANT
GLOVE SURG ORTHO LTX SZ8 (GLOVE) ×4 IMPLANT
GOWN STRL REUS W/ TWL LRG LVL3 (GOWN DISPOSABLE) ×2 IMPLANT
GOWN STRL REUS W/ TWL XL LVL3 (GOWN DISPOSABLE) ×2 IMPLANT
GOWN STRL REUS W/TWL LRG LVL3 (GOWN DISPOSABLE) ×2
GOWN STRL REUS W/TWL XL LVL3 (GOWN DISPOSABLE) ×2
HEMOSTAT ARISTA ABSORB 3G PWDR (HEMOSTASIS) IMPLANT
HEMOSTAT SURGICEL 2X4 FIBR (HEMOSTASIS) ×4 IMPLANT
ILLUMINATOR WAVEGUIDE N/F (MISCELLANEOUS) ×2 IMPLANT
KIT BASIN OR (CUSTOM PROCEDURE TRAY) ×4 IMPLANT
KIT TURNOVER KIT B (KITS) ×4 IMPLANT
NDL HYPO 25GX1X1/2 BEV (NEEDLE) ×2 IMPLANT
NEEDLE HYPO 25GX1X1/2 BEV (NEEDLE) ×4 IMPLANT
NS IRRIG 1000ML POUR BTL (IV SOLUTION) ×4 IMPLANT
PACK SURGICAL SETUP 50X90 (CUSTOM PROCEDURE TRAY) ×4 IMPLANT
PAD ARMBOARD 7.5X6 YLW CONV (MISCELLANEOUS) ×4 IMPLANT
PENCIL BUTTON HOLSTER BLD 10FT (ELECTRODE) ×4 IMPLANT
POSITIONER HEAD DONUT 9IN (MISCELLANEOUS) ×4 IMPLANT
SPONGE GAUZE 2X2 STER 10/PKG (GAUZE/BANDAGES/DRESSINGS) ×2
SPONGE INTESTINAL PEANUT (DISPOSABLE) IMPLANT
STRIP CLOSURE SKIN 1/2X4 (GAUZE/BANDAGES/DRESSINGS) ×3 IMPLANT
SUT MNCRL AB 4-0 PS2 18 (SUTURE) ×2 IMPLANT
SUT SILK 2 0 (SUTURE)
SUT SILK 2-0 18XBRD TIE 12 (SUTURE) IMPLANT
SUT SILK 3 0 (SUTURE)
SUT SILK 3-0 18XBRD TIE 12 (SUTURE) IMPLANT
SUT VIC AB 3-0 SH 18 (SUTURE) ×2 IMPLANT
SUT VIC AB 3-0 SH 8-18 (SUTURE) ×2 IMPLANT
SYR BULB EAR ULCER 3OZ GRN STR (SYRINGE) ×4 IMPLANT
SYR CONTROL 10ML LL (SYRINGE) ×4 IMPLANT
TOWEL GREEN STERILE (TOWEL DISPOSABLE) ×4 IMPLANT
TOWEL GREEN STERILE FF (TOWEL DISPOSABLE) ×4 IMPLANT
TUBE CONNECTING 12'X1/4 (SUCTIONS) ×1
TUBE CONNECTING 12X1/4 (SUCTIONS) ×3 IMPLANT

## 2021-11-09 NOTE — Transfer of Care (Signed)
Immediate Anesthesia Transfer of Care Note  Patient: Karla Garner  Procedure(s) Performed: TOTAL PARATHYROIDECTOMY (Neck) THYROID AUTOTRANSPLANT (Left: Arm Lower)  Patient Location: PACU  Anesthesia Type:General  Level of Consciousness: drowsy  Airway & Oxygen Therapy: Patient Spontanous Breathing and Patient connected to nasal cannula oxygen  Post-op Assessment: Report given to RN and Post -op Vital signs reviewed and stable  Post vital signs: Reviewed and stable  Last Vitals:  Vitals Value Taken Time  BP 157/83 11/09/21 1235  Temp 37.6 C 11/09/21 1235  Pulse 117 11/09/21 1243  Resp 21 11/09/21 1243  SpO2 99 % 11/09/21 1243  Vitals shown include unvalidated device data.  Last Pain:  Vitals:   11/09/21 1235  TempSrc:   PainSc: Asleep         Complications: No notable events documented.

## 2021-11-09 NOTE — Interval H&P Note (Signed)
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  The various methods of treatment have been discussed with the patient and family. After consideration of risks, benefits and other options for treatment, the patient has consented to    Procedure(s): TOTAL PARATHYROIDECTOMY (N/A) THYROID AUTOTRANSPLANT (N/A) as a surgical intervention.    The patient's history has been reviewed, patient examined, no change in status, stable for surgery.  I have reviewed the patient's chart and labs.  Questions were answered to the patient's satisfaction.    Armandina Gemma, [facility] [place] [facility] Office: [place]

## 2021-11-09 NOTE — Anesthesia Procedure Notes (Signed)
Procedure Name: Intubation Date/Time: 11/09/2021 9:55 AM Performed by: Eligha Bridegroom, CRNA Pre-anesthesia Checklist: Patient identified, Emergency Drugs available, Suction available, Patient being monitored and Timeout performed Patient Re-evaluated:Patient Re-evaluated prior to induction Oxygen Delivery Method: Circle system utilized Preoxygenation: Pre-oxygenation with 100% oxygen Induction Type: IV induction Ventilation: Mask ventilation without difficulty and Oral airway inserted - appropriate to patient size Laryngoscope Size: Mac and 3 Tube size: 7.0 mm Number of attempts: 1 Airway Equipment and Method: Stylet Placement Confirmation: ETT inserted through vocal cords under direct vision, positive ETCO2 and breath sounds checked- equal and bilateral Secured at: 21 cm Tube secured with: Tape Dental Injury: Teeth and Oropharynx as per pre-operative assessment

## 2021-11-09 NOTE — Consult Note (Addendum)
[facility] Renal Consultation Note    Indication for Consultation:  Management of ESRD/hemodialysis; anemia, hypertension/volume and secondary hyperparathyroidism   HPI: Karla Garner is a 36 y.o. 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 Also PMH of HTN, anemia of CKD, and severe secondary hyperparathyroidism for which parathyroidectomy was planned.  She underwent total parathyroidectomy with autotransplant to left brachioradialis muscle per Dr. Harlow Asa today. Nephrology asked to see for dialysis needs. Labs: Na 134, K 4.9, BUN 40 Cr 8.0. Ca 9.8 Hgb 12.6   Usual dialysis MWF at [facility]. Compliant with dialysis and last treatment was Wednesday.  Seen in the PACU. Spanish interpreter at bedside. She's still a little sedated but answers appropriately. No complaints. For usual dialysis tomorrow.   Past Medical History:  Diagnosis Date   Anemia    Depression    Dysuria 11/04/2018   ESRD (end stage renal disease) ([place])    from IgA nephritis   Glomerulonephritis    Hemodialysis patient [facility])    Hypertension    Menorrhagia with regular cycle 11/04/2018   Normocytic anemia 08/02/2015   PONV (postoperative nausea and vomiting)    was on dialysis i during pregnancy2014   Swelling of right side of face 11/01/2017   Thrombocytopenia ([place])    07/2015 in setting of sepsis   Past Surgical History:  Procedure Laterality Date   A/V FISTULAGRAM N/A 01/08/2018   Procedure: A/V FISTULAGRAM - right arm;  Surgeon: [name], MD;  Location: [place] [facility];  Service: Cardiovascular;  Laterality: N/A;   AV FISTULA PLACEMENT Left    AV FISTULA PLACEMENT Right 04/14/2015   Procedure: CREATION OF RIGHT RADIOCEPHALIC ARTERIOVENOUS (AV) FISTULA ;  Surgeon: Angelia Mould, MD;  Location: [place];  Service: Vascular;  Laterality: Right;   AV FISTULA PLACEMENT Right 11/22/2016   Procedure: ARTERIOVENOUS (AV) FISTULA CREATION;   Surgeon: [name], MD;  Location: [place];  Service: Vascular;  Laterality: Right;   CESAREAN SECTION     2009   ESOPHAGOGASTRODUODENOSCOPY N/A 11/24/2016   Procedure: ESOPHAGOGASTRODUODENOSCOPY (EGD);  Surgeon: [name], MD;  Location: [facility] ENDOSCOPY;  Service: Endoscopy;  Laterality: N/A;   EXCHANGE OF A DIALYSIS CATHETER Right 04/14/2015   Procedure: EXCHANGE OF RIGHT INTERNAL JUGULAR DIALYSIS CATHETER;  Surgeon: Angelia Mould, MD;  Location: [place];  Service: Vascular;  Laterality: Right;   INSERTION OF DIALYSIS CATHETER Left 11/22/2016   Procedure: INSERTION OF DIALYSIS CATHETER - LEFT INTERNAL JUGULAR PLACEMENT;  Surgeon: [name], MD;  Location: [place];  Service: Vascular;  Laterality: Left;   IR AV DIALY SHUNT INTRO NEEDLE/INTRAC INITIAL W/PTA/STENT/IMG RT Right 02/15/2020   IR AV DIALY SHUNT INTRO NEEDLE/INTRACATH INITIAL W/PTA/IMG LEFT  10/18/2020   IR DIALY SHUNT INTRO NEEDLE/INTRACATH INITIAL W/IMG RIGHT Right 07/26/2017   IR DIALY SHUNT INTRO NEEDLE/INTRACATH INITIAL W/IMG RIGHT Right 10/18/2017   IR DIALY SHUNT INTRO NEEDLE/INTRACATH INITIAL W/IMG RIGHT Right 03/10/2018   IR DIALY SHUNT INTRO NEEDLE/INTRACATH INITIAL W/IMG RIGHT Right 12/30/2018   IR PTA ADDL CENTRAL DIALYSIS SEG THRU DIALY CIRCUIT LEFT Left 10/18/2020   IR RADIOLOGIST EVAL & MGMT  07/02/2019   IR REMOVAL TUN CV CATH W/O FL  03/06/2017   IR TRANSCATH PLC STENT 1ST ART NOT LE CV CAR VERT CAR  12/30/2018   IR US GUIDE VASC ACCESS RIGHT  12/30/2018   IR US GUIDE VASC ACCESS RIGHT  12/30/2018   IR US GUIDE VASC ACCESS  RIGHT  02/15/2020   IR VENO/EXT/UNI LEFT  12/30/2018   LIGATION OF ARTERIOVENOUS  FISTULA Left 11/05/2014   Procedure: LIGATION OF LEFT ARM  BRACHIO-CEPHALIC ARTERIOVENOUS  FISTULA ,& REPAIR OF BRACHIAL ARTERY.;  Surgeon: Mal [name], MD;  Location: [place];  Service: Vascular;  Laterality: Left;   PERIPHERAL VASCULAR BALLOON ANGIOPLASTY  01/08/2018   Procedure: PERIPHERAL  VASCULAR BALLOON ANGIOPLASTY;  Surgeon: [name], MD;  Location: [place] [facility] LAB;  Service: Cardiovascular;;  innominate vein   THROMBECTOMY W/ EMBOLECTOMY Right 11/16/2016   Procedure: THROMBECTOMY REVISION OF ARTERIOVENOUS FISTULA - RIGHT ARM;  Surgeon: [name], MD;  Location: [facility] OR;  Service: Vascular;  Laterality: Right;   TUBAL LIGATION  2014   Family History  Problem Relation Age of Onset   Other Mother        no medical problems per patient   Other Father        no medical problems per patient   Colon cancer Neg Hx    Liver cancer Neg Hx    Stomach cancer Neg Hx    Social History:  reports that she has never smoked. She has never used smokeless tobacco. She reports that she does not drink alcohol and does not use drugs. No Active Allergies Prior to Admission medications   Medication Sig Start Date End Date Taking? Authorizing Provider  amLODipine (NORVASC) 10 MG tablet Take 10 mg by mouth at bedtime. 09/02/21  Yes [provider]  B Complex Vitamins (B-COMPLEX/B-12) TABS Take 1 tablet by mouth daily.   Yes [provider]  B Complex-C-Zn-Folic Acid (DIALYVITE 833 WITH ZINC) 0.8 MG TABS Take 1 tablet by mouth daily. 03/21/20  Yes [provider]  diphenhydrAMINE (BENADRYL) 25 MG tablet Take 25 mg by mouth every 6 (six) hours as needed for itching.   Yes [provider]  ferric citrate (AURYXIA) 1 GM 210 MG(Fe) tablet Take 420-630 mg by mouth See admin instructions. Take 630 mg with each meal and large snack, take 420 mg with each small snack   Yes [provider]  ferrous sulfate 325 (65 FE) MG tablet Take 325 mg by mouth daily with breakfast.   Yes [provider]  levothyroxine (SYNTHROID) 50 MCG tablet Take 50 mcg by mouth daily. 09/02/21  Yes [provider]  Multiple Vitamin (MULTIVITAMIN WITH MINERALS) TABS tablet Take 1 tablet by mouth daily.   Yes [provider]  megestrol  (MEGACE) 40 MG tablet Take 1 tablet (40 mg total) by mouth 2 (two) times daily. Patient not taking: Reported on 06/15/538 7/67/34   [name], MD   Current Facility-Administered Medications  Medication Dose Route Frequency Provider Last Rate Last Admin   0.9 %  sodium chloride infusion   Intravenous Continuous Armandina Gemma, MD 10 mL/hr at 11/09/21 0946 Restarted at 11/09/21 1220   ceFAZolin (ANCEF) 2-4 GM/100ML-% IVPB            Chlorhexidine Gluconate Cloth 2 % PADS 6 each  6 each Topical Once Armandina Gemma, MD       And   Chlorhexidine Gluconate Cloth 2 % PADS 6 each  6 each Topical Once Armandina Gemma, MD       fentaNYL (SUBLIMAZE) 100 MCG/2ML injection            fentaNYL (SUBLIMAZE) injection 25-50 mcg  25-50 mcg Intravenous Q5 min PRN Lidia Collum, MD   25 mcg at 11/09/21 1348   lactated ringers infusion  Intravenous Continuous Santa Lighter, MD       ondansetron [facility]) injection 4 mg  4 mg Intravenous Once PRN Lidia Collum, MD       oxyCODONE (Oxy IR/ROXICODONE) immediate release tablet 5 mg  5 mg Oral Once PRN Lidia Collum, MD       Or   oxyCODONE (ROXICODONE) 5 MG/5ML solution 5 mg  5 mg Oral Once PRN Lidia Collum, MD         ROS: As per HPI otherwise negative.  Physical Exam: Vitals:   11/09/21 1250 11/09/21 1305 11/09/21 1320 11/09/21 1335  BP: (!) 162/85 (!) 153/85 (!) 161/86 (!) 159/92  Pulse: 100 (!) 103 (!) 102 (!) 105  Resp: 11 12 10 13   Temp:      TempSrc:      SpO2: 99% 99% 99% 99%  Weight:      Height:         General: Appears comfortable, in no distress  Head: NCAT sclera not icteric MMM Neck: Supple. No JVD appreciated  Lungs: Clear bilaterally without wheezes, rales, or rhonchi. Normal WOB  Heart: Tachy, no murmur, rub, or gallop  Abdomen: soft non-tender, bowel sounds normal, no masses  Lower extremities:without edema or ischemic changes, no open wounds  Neuro: A & O X 3. Moves all extremities spontaneously. Psych:  Responds to  questions appropriately with a normal affect. Dialysis Access: RUE AVF +bruit   Labs: Basic Metabolic Panel: Recent Labs  Lab 11/07/21 1520 11/09/21 0820  NA 138 134*  K 4.8 4.9  CL 91* 100  CO2 31  --   GLUCOSE 101* 87  BUN 53* 40*  CREATININE 8.65* 8.00*  CALCIUM 9.8  --    Liver Function Tests: No results for input(s): AST, ALT, ALKPHOS, BILITOT, PROT, ALBUMIN in the last 168 hours. No results for input(s): LIPASE, AMYLASE in the last 168 hours. No results for input(s): AMMONIA in the last 168 hours. CBC: Recent Labs  Lab 11/07/21 1520 11/09/21 0820  WBC 5.6  --   HGB 11.3* 12.6  HCT 35.2* 37.0  MCV 92.6  --   PLT 209  --    Cardiac Enzymes: No results for input(s): CKTOTAL, CKMB, CKMBINDEX, TROPONINI in the last 168 hours. CBG: No results for input(s): GLUCAP in the last 168 hours. Iron Studies: No results for input(s): IRON, TIBC, TRANSFERRIN, FERRITIN in the last 72 hours. Studies/Results: No results found.  Dialysis Orders:  Unit: [place] MWF  Time: 3:45 EDW: 41 kg  Flows: 350/500 Bath: 2K/2Ca  Access: R AVF  Heparin: 1700 U  ESA: Mircera 100 q 2 wks (last 1/16) Fe:  Venofer 100 mg x 10 (until 11/24/21)  VDRA: None   OP Labs 10/30/21: Ca 10.3 Phos 8.5 PTH 1859   Assessment/Plan:  2HPTH S/P Total parathyroidectomy with autotransplant today. Expect drop in calcium following PTHectomy. Will dialyze with 3.5Ca bath, start daily calcitriol and trend calcium overnight.   ESRD -  HD MWF. HD tomorrow on schedule with added Ca bath.   Hypertension/volume  - Hypertensive. Continue home amlodipine. UF to EDW.   Anemia  - Hgb 12.6. No ESA needs   Metabolic bone disease -  Continue Auryxia binder for now   Lynnda Child PA-C [facility] 11/09/2021, 1:51 PM  Nephrology attending: I have personally seen and examined patient in the PACU.  Chart reviewed.  I agree with the consult note above.  ESRD on HD status post parathyroidectomy with  autotransplant in forearm, seen as a consultation for the management of ESRD.  Need to monitor calcium level very closely after the surgery.  We will check calcium now and in the evening.  Continue calcitriol and may need to replete calcium depending on the lab value.  Plan for regular HD tomorrow.  Katheran James, MD [facility].

## 2021-11-09 NOTE — Op Note (Signed)
Operative Note  Pre-operative Diagnosis:  secondary hyperparathyroidism, ESRD  Post-operative Diagnosis:  same  Surgeon:  Armandina Gemma, MD  Assistant:  none   Procedure:  total parathyroidectomy with autotransplant to left brachioradialis muscle  Anesthesia:  general  Estimated Blood Loss:  minimal  Drains: none         Specimen: parathyroid glands to pathology  Indications:  Patient is referred by Dr. Edrick Oh for surgical evaluation and management of secondary hyperparathyroidism due to end-stage renal disease. Patient is accompanied by a family member who acts as an Astronomer. The patient speaks [place] and limited [place]. The patient has been on hemodialysis for approximately 10 years. Her current access is in her right upper extremity. Patient has developed secondary hyperparathyroidism. Recent laboratory studies show an elevated intact PTH level of 1404, a calcium level of 10.0, and a calcium times phosphorus product of 70. Patient has noted bone and joint discomfort. She is noted chronic fatigue. Patient has had no prior head or neck surgery. She dialyzes at the [facility].  Procedure:  The patient was seen in the pre-op holding area. The risks, benefits, complications, treatment options, and expected outcomes were previously discussed with the patient. The patient agreed with the proposed plan and has signed the informed consent form.  The patient was brought to the operating room by the surgical team, identified as Karla Garner and the procedure verified. A "time out" was completed and the above information confirmed.  Following administration of general anesthesia the patient is positioned and then prepped and draped in the usual aseptic fashion.  After ascertaining that an adequate level of anesthesia been achieved, a small Kocher incision is made with a #15 blade.  Dissection is carried through subcutaneous tissues and platysma.  Hemostasis is  achieved with the electrocautery.  Subplatysmal flaps are developed cephalad and caudad.  Self-retaining retractors are placed for exposure.  Strap muscles are incised in the midline.  Dissection is begun on the left side.  Left thyroid lobe is small and firm without significant nodularity.  Just inferior to the left thyroid lobe is a nodular mass at the inferior pole of the thyroid.  This is gently mobilized.  Vascular structures are divided between small ligaclips and the nodule is excised.  It measures about 1 cm in size.  Frozen section biopsy confirms hypercellular parathyroid tissue.  The remainder of the gland is placed in ice saline on the back table.  Further dissection on the left side of the neck reveals an enlarged parathyroid gland posterior to the superior pole of the left thyroid lobe.  This is gently mobilized.  Vascular structures are divided between ligaclips and the gland is excised.  Again a frozen section biopsy is taken and confirms hypercellular parathyroid tissue.  The remainder of the gland is kept on ice saline on the back table.  Next we turned our attention to the right side of the neck.  Right thyroid lobe is mobilized.  It is also moderately firm, slightly enlarged, but without significant nodularity.  There is an enlarged parathyroid gland on the posterior aspect of the inferior pole.  This is dissected out.  Vascular structures are divided between small ligaclips and the gland is excised.  Frozen section biopsy again confirms hypercellular parathyroid tissue and the gland is kept an eye saline on the back table.  Finally dissection is carried cephalad posterior to the right thyroid lobe.  Posterior to the superior pole lying on the precervical fascia  is an enlarged parathyroid gland which is gently mobilized.  Vascular pedicles divided between ligaclips and the gland is excised.  Frozen section biopsy confirms hypercellular parathyroid tissue.  The gland is placed in ice  saline on the back table.  Neck is irrigated with warm saline and good hemostasis is achieved throughout the operative field.  Fibrillar is placed throughout the operative field.  Strap muscles are reapproximated in the midline of interrupted 3-0 Vicryl sutures.  Platysma was closed with interrupted 3-0 Vicryl sutures.  Skin is closed with a running 4-0 Monocryl subcuticular suture.  Wound is washed and dried and Dermabond is applied as dressing.  Next the left arm is extended on an armboard.  The area over the left brachial radialis muscle is then prepped and draped in usual aseptic fashion.  Another timeout is held.  After ascertaining that an adequate level of anesthesia been maintained, a 4 cm incision is made with a #15 blade over the brachial radialis muscle.  Dissection is carried down to the muscle fascia and skin flaps are developed circumferentially.  A self-retaining retractors placed.  Each of the 4 parathyroid glands were then inspected and to small 1 mm fragments were taken from each gland.  In total, 8 parathyroid fragments were then implanted into the left brachial radialis muscle by making an incision in the muscle fascia, creating a muscular pocket, inserting a fragment of parathyroid tissue, and closing the overlying fascia with an interrupted 4-0 Prolene suture.  Good hemostasis was noted after all 8 fragments were implanted.  Subcutaneous tissues were closed with interrupted 3-0 Vicryl sutures.  Skin was closed with a running 4-0 Monocryl subcuticular suture.  Wound was washed and dried and Dermabond was applied as dressing.  Patient was awakened from anesthesia and transported to the recovery room.  The patient tolerated the procedure well.   Armandina Gemma, [facility] Office: [phone]

## 2021-11-09 NOTE — Anesthesia Postprocedure Evaluation (Signed)
Anesthesia Post Note  Patient: Karla Garner  Procedure(s) Performed: TOTAL PARATHYROIDECTOMY (Neck) THYROID AUTOTRANSPLANT (Left: Arm Lower)     Patient location during evaluation: PACU Anesthesia Type: General Level of consciousness: awake and alert Pain management: pain level controlled Vital Signs Assessment: post-procedure vital signs reviewed and stable Respiratory status: spontaneous breathing, nonlabored ventilation and respiratory function stable Cardiovascular status: blood pressure returned to baseline and stable Postop Assessment: no apparent nausea or vomiting Anesthetic complications: no   No notable events documented.  Last Vitals:  Vitals:   11/09/21 1350 11/09/21 1405  BP: (!) 155/82 (!) 156/83  Pulse: 97 (!) 103  Resp: 10 14  Temp:  37.1 C  SpO2: 98% 97%    Last Pain:  Vitals:   11/09/21 1305  TempSrc:   PainSc: Asleep                 Lidia Collum

## 2021-11-10 ENCOUNTER — Encounter (HOSPITAL_COMMUNITY): Payer: Self-pay | Admitting: Surgery

## 2021-11-10 LAB — CALCIUM
Calcium: 7.2 mg/dL — ABNORMAL LOW (ref 8.9–10.3)
Calcium: 8.1 mg/dL — ABNORMAL LOW (ref 8.9–10.3)

## 2021-11-10 LAB — SURGICAL PATHOLOGY

## 2021-11-10 LAB — HEPATITIS B SURFACE ANTIBODY, QUANTITATIVE: Hep B S AB Quant (Post): 238.7 m[IU]/mL (ref >9.9)

## 2021-11-10 MED ORDER — CALCIUM CARBONATE ANTACID 500 MG PO CHEW
1.0000 | CHEWABLE_TABLET | Freq: Every day | ORAL | Status: DC
  Administered 2021-11-10 – 2021-11-11 (×2): 200 mg via ORAL
  Filled 2021-11-10 (×2): qty 1

## 2021-11-10 MED ORDER — CALCITRIOL 0.5 MCG PO CAPS
1.5000 ug | ORAL_CAPSULE | Freq: Every day | ORAL | Status: DC
  Administered 2021-11-11 – 2021-11-12 (×2): 1.5 ug via ORAL
  Filled 2021-11-10 (×3): qty 3

## 2021-11-10 NOTE — Progress Notes (Signed)
[facility] Progress Note   Subjective:    Seen and examined patient at bedside. Laptop interpreter utilized. S/p parathyroidectomy with autotransplant to left brachioradialis 11/09/21 via Dr. Harlow Asa. Currently c/o neck pain and discomfort after swallowing. Denies SOB, CP, and N/V. Plan for HD today.  Objective Vitals:   11/09/21 2149 11/10/21 0427 11/10/21 0819 11/10/21 0851  BP: (!) 161/100 (!) 144/84 (!) 149/88 (!) 157/84  Pulse: 88 92 92 86  Resp: 18 16 18 13   Temp: 98.4 F (36.9 C) 98.4 F (36.9 C) (!) 97.5 F (36.4 C) 98.8 F (37.1 C)  TempSrc: Oral Oral Oral   SpO2: 100% 100% 100% 100%  Weight:    44.1 kg  Height:       Physical Exam General: Lying in bed, awake, NAD Heart: Normal S1 and S2; No murmurs, gallops, or rubs Lungs: Clear throughout; No wheezing, rales, or rhonchi Abdomen: Soft and non-tender Extremities: No edema BLLE Dialysis Access: R AVF (+) Bruit/Thrill   Filed Weights   11/09/21 0739 11/10/21 0851  Weight: 40.8 kg 44.1 kg    Intake/Output Summary (Last 24 hours) at 11/10/2021 0912 Last data filed at 11/10/2021 0300 Gross per 24 hour  Intake 750 ml  Output 1 ml  Net 749 ml    Additional Objective Labs: Basic Metabolic Panel: Recent Labs  Lab 11/07/21 1520 11/09/21 0820 11/09/21 1715 11/10/21 0456  NA 138 134*  --   --   K 4.8 4.9  --   --   CL 91* 100  --   --   CO2 31  --   --   --   GLUCOSE 101* 87  --   --   BUN 53* 40*  --   --   CREATININE 8.65* 8.00*  --   --   CALCIUM 9.8  --  8.5* 7.2*   Liver Function Tests: No results for input(s): AST, ALT, ALKPHOS, BILITOT, PROT, ALBUMIN in the last 168 hours. No results for input(s): LIPASE, AMYLASE in the last 168 hours. CBC: Recent Labs  Lab 11/07/21 1520 11/09/21 0820  WBC 5.6  --   HGB 11.3* 12.6  HCT 35.2* 37.0  MCV 92.6  --   PLT 209  --    Blood Culture    Component Value Date/Time   SDES THROAT 03/26/2016 1200   SPECREQUEST NONE 03/26/2016 1200    CULT NO GROUP A STREP (S.PYOGENES) ISOLATED 03/26/2016 1200   REPTSTATUS 03/28/2016 FINAL 03/26/2016 1200    Cardiac Enzymes: No results for input(s): CKTOTAL, CKMB, CKMBINDEX, TROPONINI in the last 168 hours. CBG: No results for input(s): GLUCAP in the last 168 hours. Iron Studies: No results for input(s): IRON, TIBC, TRANSFERRIN, FERRITIN in the last 72 hours. Lab Results  Component Value Date   INR 1.0 12/30/2018   INR 0.83 11/20/2016   INR 1.23 08/03/2015   Studies/Results: No results found.  Medications:  sodium chloride 10 mL/hr at 11/09/21 1510   sodium chloride Stopped (11/09/21 1511)   sodium chloride     sodium chloride      amLODipine  10 mg Oral QHS   calcitRIOL  0.5 mcg Oral Daily   Chlorhexidine Gluconate Cloth  6 each Topical Q0600   ferric citrate  630 mg Oral TID WC   levothyroxine  50 mcg Oral QAC breakfast    Dialysis Orders: Unit: [place] MWF  Time: 3:45 EDW: 41 kg  Flows: 350/500 Bath: 2K/2Ca  Access: R AVF  Heparin: 1700 U  ESA: Mircera 100 q 2 wks (last 1/16) Fe:  Venofer 100 mg x 10 (until 11/24/21)  VDRA: None   Assessment/Plan: Secondary Hyperparathyroidism S/P Total parathyroidectomy with autotransplant today. Expect drop in calcium following PTHectomy. Ca now 7.2. Will dialyze with 3.5Ca bath, now on daily calcitriol and trend calcium overnight.  ESRD -  HD MWF. HD today per usual schedule with added Ca bath.  Hypertension/volume  - Some noted BP elevation. Continue home amlodipine and monitor BP trend. UF to EDW.  Anemia  - Hgb 12.6. No ESA needs  Metabolic bone disease -  Continue Auryxia binder, will check pO4 in AM  Tobie Poet, NP [facility] 11/10/2021,9:12 AM  LOS: 1 day

## 2021-11-10 NOTE — Progress Notes (Signed)
Pt receives out-pt HD at [facility] on MWF. Pt arrives at 5:05 for 5:15 chair time. Will assist as needed.  Melven Sartorius Renal Navigator [phone]

## 2021-11-10 NOTE — Progress Notes (Signed)
Evaluated patient with assistance of spanish interpretor. She is able to swallow liquids though with some expected postoperative pain. Strong voice. Encouraged continued hydration with water and to stay upright and ambulate. Left arm site of autotransplant with incision c/d/I with surgical glue - no concerning erythema or swelling. Left hand WWP with strong radial pulse and motor function intact. Right arm with AV fistula with palpable thrill. Lower arm and hand through fingers is mildly edematous. Hand WWP and motor function intact. Continue use of arm and elevate at rest.

## 2021-11-10 NOTE — Progress Notes (Signed)
Assessment & Plan: POD#1 - status post total parathyroidectomy with autotransplant to left forearm  Nice fall in calcium level to 7.2 - good sign all glands identified and removed  Hemodialysis this AM per nephrology  Rx pain  Right upper extremity swelling after dialysis - HD team to assess  Home when stable from renal standpoint and able to resume out-patient dialysis.        Armandina Gemma, MD       [facility]       Office: [phone]   Chief Complaint: Secondary hyperparathyroidism  Subjective: Patient up in chair, some pain.  Worried about swelling in right arm.  Taking soup.  Throat sore.  Objective: Vital signs in last 24 hours: Temp:  [97.3 F (36.3 C)-98.8 F (37.1 C)] 98.5 F (36.9 C) (01/20 1420) Pulse Rate:  [82-103] 97 (01/20 1420) Resp:  [10-18] 17 (01/20 1420) BP: (144-181)/(75-100) 158/84 (01/20 1420) SpO2:  [99 %-100 %] 99 % (01/20 1420) Weight:  [41.2 kg-44.1 kg] 41.2 kg (01/20 1201)    Intake/Output from previous day: 01/19 0701 - 01/20 0700 In: 550 [I.V.:450; IV Piggyback:100] Out: 1 [Urine:1] Intake/Output this shift: Total I/O In: 200 [P.O.:200] Out: 3000 [Other:3000]  Physical Exam: HEENT - sclerae clear, mucous membranes moist Neck - soft, mild soft tissue swelling; no hematoma or seroma; voice normal Ext - incision left forearm intact with Dermabond, minimal swelling Neuro - alert & oriented, no focal deficits  Lab Results:  Recent Labs    11/09/21 0820  HGB 12.6  HCT 37.0   BMET Recent Labs    11/09/21 0820 11/09/21 1715 11/10/21 0456  NA 134*  --   --   K 4.9  --   --   CL 100  --   --   GLUCOSE 87  --   --   BUN 40*  --   --   CREATININE 8.00*  --   --   CALCIUM  --  8.5* 7.2*   PT/INR No results for input(s): LABPROT, INR in the last 72 hours. Comprehensive Metabolic Panel:    Component Value Date/Time   NA 134 (L) 11/09/2021 0820   NA 138 11/07/2021 1520   K 4.9 11/09/2021 0820   K 4.8  11/07/2021 1520   CL 100 11/09/2021 0820   CL 91 (L) 11/07/2021 1520   CO2 31 11/07/2021 1520   CO2 30 10/18/2020 0810   BUN 40 (H) 11/09/2021 0820   BUN 53 (H) 11/07/2021 1520   CREATININE 8.00 (H) 11/09/2021 0820   CREATININE 8.65 (H) 11/07/2021 1520   CREATININE 3.74 (H) 02/16/2013 1221   CREATININE 2.87 (H) 01/07/2013 1013   GLUCOSE 87 11/09/2021 0820   GLUCOSE 101 (H) 11/07/2021 1520   CALCIUM 7.2 (L) 11/10/2021 0456   CALCIUM 8.5 (L) 11/09/2021 1715   CALCIUM 8.2 (L) 04/11/2015 1508   AST 15 11/16/2016 0736   AST 36 08/04/2015 0325   ALT 11 (L) 11/16/2016 0736   ALT 28 08/04/2015 0325   ALKPHOS 64 11/16/2016 0736   ALKPHOS 55 08/04/2015 0325   BILITOT 0.4 11/16/2016 0736   BILITOT 0.8 08/04/2015 0325   PROT 5.8 (L) 11/16/2016 0736   PROT 5.9 (L) 08/04/2015 0325   ALBUMIN 2.4 (L) 11/24/2016 1245   ALBUMIN 2.7 (L) 11/23/2016 1010    Studies/Results: No results found.    Armandina Gemma 11/10/2021   Patient ID: Karla Garner, female   DOB: 1986/01/12, 36 y.o.  MRN: [id number]

## 2021-11-10 NOTE — Progress Notes (Signed)
RN spoke with pt via help of the translator, pt stated that she was having increased phlegm that she can not clear and it makes her feel like she is choking. Also brought to RN attention that her right arm looks a little dusky compared to her left and is a little swollen compared to the left. RN paged Dr. Rosendo Gros and notified him he stated he would have one of the PA's come see her.

## 2021-11-11 LAB — RENAL FUNCTION PANEL
Albumin: 3.8 g/dL (ref 3.5–5.0)
Anion gap: 11 (ref 5–15)
BUN: 24 mg/dL — ABNORMAL HIGH (ref 6–20)
CO2: 26 mmol/L (ref 22–32)
Calcium: 7.3 mg/dL — ABNORMAL LOW (ref 8.9–10.3)
Chloride: 94 mmol/L — ABNORMAL LOW (ref 98–111)
Creatinine, Ser: 6.64 mg/dL — ABNORMAL HIGH (ref 0.44–1.00)
GFR, Estimated: 8 mL/min — ABNORMAL LOW
Glucose, Bld: 98 mg/dL (ref 70–99)
Phosphorus: 4 mg/dL (ref 2.5–4.6)
Potassium: 5 mmol/L (ref 3.5–5.1)
Sodium: 131 mmol/L — ABNORMAL LOW (ref 135–145)

## 2021-11-11 MED ORDER — CALCIUM CARBONATE ANTACID 500 MG PO CHEW
400.0000 mg | CHEWABLE_TABLET | Freq: Three times a day (TID) | ORAL | Status: DC
  Administered 2021-11-11 – 2021-11-12 (×4): 400 mg via ORAL
  Filled 2021-11-11 (×4): qty 2

## 2021-11-11 NOTE — Progress Notes (Addendum)
[facility] Progress Note   Subjective:    Seen and examined patient at bedside. Patient's husband also at bedside. She reports sore neck pain and soreness after swallowing. Denies SOB and CP. Tolerated yesterday's HD with net UF 3L.   Objective Vitals:   11/10/21 2156 11/11/21 0538 11/11/21 0747 11/11/21 1615  BP:  (!) 126/51 124/70 (!) 145/80  Pulse:  98 94 (!) 101  Resp:  18 18 18   Temp:  97.9 F (36.6 C) 98.8 F (37.1 C) 99.6 F (37.6 C)  TempSrc:   Oral Oral  SpO2: 100% 100% 97% 100%  Weight:      Height:       Physical Exam General: Lying in recliner, NAD Heart: Normal S1 and S2; No murmurs, gallops, or rubs Lungs: Clear throughout; No wheezing, rales, or rhonchi Abdomen: Soft and non-tender Extremities: No edema BLLE Dialysis Access: R AVF (+) Bruit/Thrill  Skin: Incision LL forearm-intact with Dermabond; Incision anterior neck   Filed Weights   11/09/21 0739 11/10/21 0851 11/10/21 1201  Weight: 40.8 kg 44.1 kg 41.2 kg    Intake/Output Summary (Last 24 hours) at 11/11/2021 1743 Last data filed at 11/11/2021 1400 Gross per 24 hour  Intake 120 ml  Output --  Net 120 ml    Additional Objective Labs: Basic Metabolic Panel: Recent Labs  Lab 11/07/21 1520 11/09/21 0820 11/09/21 1715 11/10/21 0456 11/10/21 1650 11/11/21 0422  NA 138 134*  --   --   --  131*  K 4.8 4.9  --   --   --  5.0  CL 91* 100  --   --   --  94*  CO2 31  --   --   --   --  26  GLUCOSE 101* 87  --   --   --  98  BUN 53* 40*  --   --   --  24*  CREATININE 8.65* 8.00*  --   --   --  6.64*  CALCIUM 9.8  --    < > 7.2* 8.1* 7.3*  PHOS  --   --   --   --   --  4.0   < > = values in this interval not displayed.   Liver Function Tests: Recent Labs  Lab 11/11/21 0422  ALBUMIN 3.8   No results for input(s): LIPASE, AMYLASE in the last 168 hours. CBC: Recent Labs  Lab 11/07/21 1520 11/09/21 0820  WBC 5.6  --   HGB 11.3* 12.6  HCT 35.2* 37.0  MCV 92.6  --   PLT  209  --    Blood Culture    Component Value Date/Time   SDES THROAT 03/26/2016 1200   SPECREQUEST NONE 03/26/2016 1200   CULT NO GROUP A STREP (S.PYOGENES) ISOLATED 03/26/2016 1200   REPTSTATUS 03/28/2016 FINAL 03/26/2016 1200    Cardiac Enzymes: No results for input(s): CKTOTAL, CKMB, CKMBINDEX, TROPONINI in the last 168 hours. CBG: No results for input(s): GLUCAP in the last 168 hours. Iron Studies: No results for input(s): IRON, TIBC, TRANSFERRIN, FERRITIN in the last 72 hours. Lab Results  Component Value Date   INR 1.0 12/30/2018   INR 0.83 11/20/2016   INR 1.23 08/03/2015   Studies/Results: No results found.  Medications:  sodium chloride 10 mL/hr at 11/09/21 1510   sodium chloride Stopped (11/09/21 1511)    amLODipine  10 mg Oral QHS   calcitRIOL  1.5 mcg Oral Daily   calcium carbonate  1  tablet Oral Daily   Chlorhexidine Gluconate Cloth  6 each Topical Q0600   ferric citrate  630 mg Oral TID WC   levothyroxine  50 mcg Oral QAC breakfast    Dialysis Orders: Unit: [place] MWF  Time: 3:45 EDW: 41 kg  Flows: 350/500 Bath: 2K/2Ca  Access: R AVF  Heparin: 1700 U  ESA: Mircera 100 q 2 wks (last 1/16) Fe:  Venofer 100 mg x 10 (until 11/24/21)  VDRA: None   Assessment/Plan: Secondary Hyperparathyroidism S/P Total parathyroidectomy with autotransplant 11/09/21 by Dr. Harlow Asa. Expect drop in calcium following PTHectomy. Ca now 7.3. Will dialyze with 3.5Ca bath, up-titrated daily calcitriol 1.33mcg and receiving TUMS daily. Will check Ca in AM. Will ^ CaCO3 to 2 tabs tid between meals, keep Ca++ up a bit.  ESRD -  HD MWF. HD today per usual schedule with added Ca bath. Next HD 11/13/21. Hypertension/volume  - BP appears improved today. Continue home amlodipine and monitor BP trend. UF to EDW.  Anemia  - Hgb 12.6. No ESA needs  Metabolic bone disease -  Continue Auryxia binder, PO4 at goal. Dispo: Okay for discharge from renal standpoint. Plan to resume HD 1/23 in  outpatient.   Tobie Poet, NP [facility] 11/11/2021,5:43 PM  LOS: 2 days    Pt seen, examined and agree w assess/plan as above with additions as indicated.  [facility] 11/11/2021, 5:58 PM

## 2021-11-11 NOTE — Progress Notes (Signed)
2 Days Post-Op   Subjective/Chief Complaint: Sore in neck this am, voice normal, hd yesterday   Objective: Vital signs in last 24 hours: Temp:  [97.3 F (36.3 C)-98.8 F (37.1 C)] 98.8 F (37.1 C) (01/21 0747) Pulse Rate:  [82-103] 94 (01/21 0747) Resp:  [10-18] 18 (01/21 0747) BP: (124-181)/(51-93) 124/70 (01/21 0747) SpO2:  [75 %-100 %] 97 % (01/21 0747) Weight:  [41.2 kg] 41.2 kg (01/20 1201)    Intake/Output from previous day: 01/20 0701 - 01/21 0700 In: 200 [P.O.:200] Out: 3000  Intake/Output this shift: No intake/output data recorded.  Neck - soft, mild soft tissue swelling; no hematoma or seroma; voice normal Ext - incision left forearm intact with Dermabond, minimal swelling  Lab Results:  Recent Labs    11/09/21 0820  HGB 12.6  HCT 37.0   BMET Recent Labs    11/09/21 0820 11/09/21 1715 11/10/21 1650 11/11/21 0422  NA 134*  --   --  131*  K 4.9  --   --  5.0  CL 100  --   --  94*  CO2  --   --   --  26  GLUCOSE 87  --   --  98  BUN 40*  --   --  24*  CREATININE 8.00*  --   --  6.64*  CALCIUM  --    < > 8.1* 7.3*   < > = values in this interval not displayed.   PT/INR No results for input(s): LABPROT, INR in the last 72 hours. ABG No results for input(s): PHART, HCO3 in the last 72 hours.  Invalid input(s): PCO2, PO2  Studies/Results: No results found.  Anti-infectives: Anti-infectives (From admission, onward)    Start     Dose/Rate Route Frequency Ordered Stop   11/09/21 0745  ceFAZolin (ANCEF) IVPB 2g/100 mL premix        2 g 200 mL/hr over 30 Minutes Intravenous On call to O.R. 11/09/21 0734 11/09/21 1000   11/09/21 0738  ceFAZolin (ANCEF) 2-4 GM/100ML-% IVPB       Note to Pharmacy: Rocky Morel D: cabinet override      11/09/21 0738 11/09/21 1944       Assessment/Plan: POD 2 total parathyroid/autotransplant to left forearm -calcium looks good -oral pain meds, oob -will dc home when cleared by nephrology  Rolm Bookbinder 11/11/2021

## 2021-11-12 LAB — RENAL FUNCTION PANEL
Albumin: 3.7 g/dL (ref 3.5–5.0)
Anion gap: 13 (ref 5–15)
BUN: 44 mg/dL — ABNORMAL HIGH (ref 6–20)
CO2: 22 mmol/L (ref 22–32)
Calcium: 6.5 mg/dL — ABNORMAL LOW (ref 8.9–10.3)
Chloride: 90 mmol/L — ABNORMAL LOW (ref 98–111)
Creatinine, Ser: 10.22 mg/dL — ABNORMAL HIGH (ref 0.44–1.00)
GFR, Estimated: 5 mL/min — ABNORMAL LOW (ref >60)
Glucose, Bld: 91 mg/dL (ref 70–99)
Phosphorus: 4.1 mg/dL (ref 2.5–4.6)
Potassium: 4.9 mmol/L (ref 3.5–5.1)
Sodium: 125 mmol/L — ABNORMAL LOW (ref 135–145)

## 2021-11-12 LAB — CALCIUM
Calcium: 7 mg/dL — ABNORMAL LOW (ref 8.9–10.3)
Calcium: 6.6 mg/dL — ABNORMAL LOW (ref 8.9–10.3)

## 2021-11-12 MED ORDER — SODIUM CHLORIDE 0.9 % IV SOLN: 100.0000 mL | INTRAVENOUS | Status: DC | PRN

## 2021-11-12 MED ORDER — LIDOCAINE HCL (PF) 1 % IJ SOLN: 5.0000 mL | INTRAMUSCULAR | Status: DC | PRN

## 2021-11-12 MED ORDER — OXYCODONE HCL 5 MG PO TABS: 5.0000 mg | ORAL_TABLET | ORAL | 0 refills | Status: DC | PRN

## 2021-11-12 MED ORDER — CALCIUM GLUCONATE-NACL 2-0.675 GM/100ML-% IV SOLN
2.0000 g | Freq: Once | INTRAVENOUS | Status: DC
  Filled 2021-11-12: qty 100

## 2021-11-12 MED ORDER — CALCIUM GLUCONATE-NACL 2-0.675 GM/100ML-% IV SOLN
2.0000 g | INTRAVENOUS | Status: AC
  Administered 2021-11-12: 2000 mg via INTRAVENOUS
  Filled 2021-11-12 (×2): qty 100

## 2021-11-12 MED ORDER — CARVEDILOL 6.25 MG PO TABS
6.2500 mg | ORAL_TABLET | Freq: Two times a day (BID) | ORAL | Status: DC
  Administered 2021-11-12 – 2021-11-14 (×3): 6.25 mg via ORAL
  Filled 2021-11-12 (×4): qty 1

## 2021-11-12 MED ORDER — HEPARIN SODIUM (PORCINE) 1000 UNIT/ML DIALYSIS: 1000.0000 [IU] | INTRAMUSCULAR | Status: DC | PRN

## 2021-11-12 MED ORDER — ALTEPLASE 2 MG IJ SOLR: 2.0000 mg | Freq: Once | INTRAMUSCULAR | Status: DC | PRN

## 2021-11-12 MED ORDER — LIDOCAINE-PRILOCAINE 2.5-2.5 % EX CREA: 1.0000 "application " | TOPICAL_CREAM | CUTANEOUS | Status: DC | PRN

## 2021-11-12 MED ORDER — PENTAFLUOROPROP-TETRAFLUOROETH EX AERO: 1.0000 "application " | INHALATION_SPRAY | CUTANEOUS | Status: DC | PRN

## 2021-11-12 NOTE — TOC Progression Note (Signed)
Transition of Care [facility]) - Progression Note    Patient Details  Name: Toriana Sponsel MRN: [id number] Date of Birth: 1986/01/04  Transition of Care [facility]) CM/SW Contact  Zenon Mayo, RN Phone Number: 11/12/2021, 2:15 PM  Clinical Narrative:     Transition of Care [facility]) Screening Note   Patient Details  Name: Monea Pesantez Date of Birth: 02/05/1986   Transition of Care [facility]) CM/SW Contact:    Zenon Mayo, RN Phone Number: 11/12/2021, 2:15 PM    Transition of Care Department [facility]) has reviewed patient and no TOC needs have been identified at this time. We will continue to monitor patient advancement through interdisciplinary progression rounds. If new patient transition needs arise, please place a TOC consult.          Expected Discharge Plan and Services           Expected Discharge Date: 11/12/21                                     Social Determinants of Health (SDOH) Interventions    Readmission Risk Interventions No flowsheet data found.

## 2021-11-12 NOTE — Progress Notes (Addendum)
3 Days Post-Op   Subjective/Chief Complaint: Sore but doing well   Objective: Vital signs in last 24 hours: Temp:  [98.5 F (36.9 C)-99.6 F (37.6 C)] 98.5 F (36.9 C) (01/22 0808) Pulse Rate:  [99-102] 99 (01/22 0808) Resp:  [16-18] 16 (01/22 0808) BP: (145-166)/(80-99) 162/99 (01/22 0808) SpO2:  [94 %-100 %] 94 % (01/22 0808)    Intake/Output from previous day: 01/21 0701 - 01/22 0700 In: 120 [P.O.:120] Out: -  Intake/Output this shift: No intake/output data recorded.  Neck - soft, mild soft tissue swelling; no hematoma or seroma; voice normal Ext - incision left forearm without infection minimal swelling  Lab Results:  No results for input(s): WBC, HGB, HCT, PLT in the last 72 hours. BMET Recent Labs    11/10/21 1650 11/11/21 0422  NA  --  131*  K  --  5.0  CL  --  94*  CO2  --  26  GLUCOSE  --  98  BUN  --  24*  CREATININE  --  6.64*  CALCIUM 8.1* 7.3*   PT/INR No results for input(s): LABPROT, INR in the last 72 hours. ABG No results for input(s): PHART, HCO3 in the last 72 hours.  Invalid input(s): PCO2, PO2  Studies/Results: No results found.  Anti-infectives: Anti-infectives (From admission, onward)    Start     Dose/Rate Route Frequency Ordered Stop   11/09/21 0745  ceFAZolin (ANCEF) IVPB 2g/100 mL premix        2 g 200 mL/hr over 30 Minutes Intravenous On call to O.R. 11/09/21 0734 11/09/21 1000   11/09/21 0738  ceFAZolin (ANCEF) 2-4 GM/100ML-% IVPB       Note to Pharmacy: Rocky Morel D: cabinet override      11/09/21 0738 11/09/21 1944       Assessment/Plan: POD 3 total parathyroid/autotransplant to left forearm -calcium looks good -oral pain meds, oob -dc home today -meds per renal as in their note  Addendum:   calcium did come back at 6.5 so will keep here today now     Rolm Bookbinder 11/12/2021

## 2021-11-12 NOTE — Discharge Instructions (Addendum)
[facility], [place] [phone]  THYROID/ PARATHYROID SURGERY: POST OP INSTRUCTIONS CIRUGA DE TIROIDES/ PARATIROIDES: INSTRUCCIONES POSTOP  Always review your discharge instruction sheet given to you by the facility where your surgery was performed. Siempre revise la hoja de instrucciones de alta que le entreg el centro donde se realiz la ciruga.   IF YOU HAVE DISABILITY OR FAMILY LEAVE FORMS, YOU MUST BRING THEM TO THE OFFICE FOR PROCESSING.  PLEASE DO NOT GIVE THEM TO YOUR DOCTOR. [place], DEBE TRAERLOS A LA OFICINA PARA SU PROCESAMIENTO. POR FAVOR NO SE LOS D A SU MDICO.  A prescription for pain medication may be given to you upon discharge.  Take your pain medication as prescribed, if needed.  If narcotic pain medicine is not needed, then you may take acetaminophen (Tylenol) or ibuprofen (Advil) as needed. *Es posible que le den una receta para analgsicos cuando le den de alta. Tome su medicamento para el dolor segn lo recetado, si es necesario. Si no necesita analgsicos narcticos, puede tomar acetaminofeno (Tylenol) o ibuprofeno (Advil), segn sea necesario. Take your usually prescribed medications unless otherwise directed. *Tome los medicamentos recetados habitualmente, a menos que se le indique lo contrario. If you need a refill on your pain medication, please contact your pharmacy. They will contact our office to request authorization.  Prescriptions will not be filled after 5pm or on week-ends. *Si necesita un resurtido de su medicamento para Conservation officer, historic buildings, comunquese con su farmacia. Se pondrn en contacto con nuestra oficina para solicitar autorizacin. Las recetas no se surtirn despus de las 5 p. m. ni los fines de [place]. You should follow a light diet the first 24 hours after arrival home, such as soup and crackers, etc.  Be sure to include lots of fluids daily.  Resume your normal diet the day after  surgery. *Debe seguir una dieta ligera las primeras 24 horas despus de llegar a casa, como sopa y galletas, etc. Asegrese de incluir muchos lquidos diariamente. Reanude su dieta normal el da despus de la [place]. Most patients will experience some swelling and bruising on the chest and neck area.  Ice packs will help.  Swelling and bruising can take several days to resolve.  *La mayora de los pacientes experimentarn algo de hinchazn y moretones en el rea del pecho y el cuello. Las bolsas de hielo ayudarn. La hinchazn y los moretones pueden tardar [employer]. It is common to experience some constipation if taking pain medication after surgery.  Increasing fluid intake and taking a stool softener will usually help or prevent this problem from occurring.  A mild laxative (Milk of Magnesia or Miralax) should be taken according to package directions if there are no bowel movements after 48 hours. *Es comn experimentar algo de estreimiento si se toman analgsicos despus de la ciruga. Aumentar la ingesta de lquidos y tomar un ablandador de heces generalmente ayudar o evitar que ocurra este problema. Se debe tomar un laxante suave (leche de magnesia o Miralax) de acuerdo con las instrucciones del paquete si no hay deposiciones despus de 48 horas. Unless discharge instructions indicate otherwise, you may remove your bandages 24-48 hours after surgery, and you may shower at that time.  You may have steri-strips (small skin tapes) in place directly over the incision.  These strips should be left on the skin for 7-10 days.  If your surgeon used skin glue on the incision, you may shower in 24 hours.  The glue will flake off over the next 2-3 weeks.  Any sutures or staples will be removed at the office during your follow-up visit. *A menos que las instrucciones de alta indiquen lo contrario, puede quitarse los vendajes 24 a 48 horas despus de la [place] y puede ducharse en ese momento.  Es posible que tenga steri-strips (pequeas cintas para la piel) colocadas directamente sobre la incisin. Estas tiras deben dejarse sobre la piel durante 7-10 [place]. Si su cirujano [place] pegamento para la piel en la incisin, puede ducharse en 24 horas. El pegamento se desprender en las prximas 2 a 3 semanas. Se retirarn todas las suturas o grapas en el consultorio durante su visita de seguimiento. ACTIVITIES:  You may resume regular (light) daily activities beginning the next day--such as daily self-care, walking, climbing stairs--gradually increasing activities as tolerated.  You may have sexual intercourse when it is comfortable.  Refrain from any heavy lifting or straining until approved by your doctor. *ACTIVIDADES: puede reanudar las actividades diarias regulares (livianas) a partir del da siguiente, como el cuidado personal diario, caminar, subir escaleras, aumentando gradualmente las actividades segn lo tolere. Puede tener relaciones sexuales cuando le resulte cmodo. Abstngase de levantar objetos pesados o esforzarse hasta que su mdico lo apruebe. You may drive when you no longer are taking prescription pain medication, you can comfortably wear a seatbelt, and you can safely maneuver your car and apply brakes *Puede conducir cuando ya no est tomando analgsicos recetados, puede usar cmodamente el cinturn de seguridad y Warehouse manager su automvil y Midwife los frenos de [place] segura. You should see your doctor in the office for a follow-up appointment approximately two weeks after your surgery.  Make sure that you call for this appointment within a day or two after you arrive home to insure a convenient appointment time. *Debe ver a su mdico en el consultorio para una cita de seguimiento [employer] despus de la [place]. Asegrese de llamar para esta cita dentro de uno o [employer] de llegar a casa para asegurar una hora de cita conveniente.   WHEN TO CALL YOUR  DOCTOR: Fever over 101.0 Inability to urinate Nausea and/or vomiting Extreme swelling or bruising Continued bleeding from incision. Increased pain, redness, or drainage from the incision. Difficulty swallowing or breathing Muscle cramping or spasms. Numbness or tingling in hands or feet or around lips.  CUNDO LLAMAR A SU MDICO: 1. Fiebre superior a 101,0 2. Incapacidad para orinar 3. Nuseas y/o vmitos 4. Hinchazn extrema o hematomas 5. Sangrado continuo de la incisin. 6. Aumento del dolor, enrojecimiento o drenaje de la incisin. 7. Dificultad para tragar o respirar 8. Calambres o espasmos musculares. 9. Entumecimiento u hormigueo en manos o pies o alrededor [employer].  The clinic staff is available to answer your questions during regular business hours.  Please dont hesitate to call and ask to speak to one of the nurses if you have concerns.  For further questions, please visit [email or web address]  El personal de la clnica est disponible para responder a sus preguntas durante el horario comercial habitual. No dude en llamar y pedir hablar con una de las enfermeras si tiene alguna inquietud.  Si tiene ms preguntas, visite [email or web address]

## 2021-11-12 NOTE — Progress Notes (Signed)
[facility] Progress Note   Subjective:    Seen and examined patient at discharge. Patient's husband also at bedside and bedside interpreter was utilized. Discharge has been canceled d/t recent Ca level 6.5. Currently no evidence of muscle twitching/jerking noted. She denies SOB and CP; however, she reports vaginal itchiness with yellow discharge. Denies vaginal bleeding. Calcium supplementation raised and trending Ca levels. Plan for HD 11/13/21.  Objective Vitals:   11/11/21 1615 11/11/21 2009 11/12/21 0404 11/12/21 0808  BP: (!) 145/80 (!) 166/91 (!) 154/86 (!) 162/99  Pulse: (!) 101 (!) 102 100 99  Resp: 18 16 16 16   Temp: 99.6 F (37.6 C) 98.7 F (37.1 C) 98.5 F (36.9 C) 98.5 F (36.9 C)  TempSrc: Oral Oral  Oral  SpO2: 100% 100% 100% 94%  Weight:      Height:       Physical Exam General: Lying in bed, NAD Heart: Normal S1 and S2; No murmurs, gallops, or rubs Lungs: Clear throughout; No wheezing, rales, or rhonchi Abdomen: Soft and non-tender Extremities: No edema BLLE Neuro: No muscle twitching/jerking Dialysis Access: R AVF (+) Bruit/Thrill  Skin: Incision LL forearm-intact with Dermabond; Incision anterior neck   Filed Weights   11/09/21 0739 11/10/21 0851 11/10/21 1201  Weight: 40.8 kg 44.1 kg 41.2 kg    Intake/Output Summary (Last 24 hours) at 11/12/2021 1106 Last data filed at 11/11/2021 1400 Gross per 24 hour  Intake 120 ml  Output --  Net 120 ml    Additional Objective Labs: Basic Metabolic Panel: Recent Labs  Lab 11/07/21 1520 11/09/21 0820 11/09/21 1715 11/10/21 1650 11/11/21 0422 11/12/21 0812  NA 138 134*  --   --  131* 125*  K 4.8 4.9  --   --  5.0 4.9  CL 91* 100  --   --  94* 90*  CO2 31  --   --   --  26 22  GLUCOSE 101* 87  --   --  98 91  BUN 53* 40*  --   --  24* 44*  CREATININE 8.65* 8.00*  --   --  6.64* 10.22*  CALCIUM 9.8  --    < > 8.1* 7.3* 6.5*  PHOS  --   --   --   --  4.0 4.1   < > = values in this  interval not displayed.   Liver Function Tests: Recent Labs  Lab 11/11/21 0422 11/12/21 0812  ALBUMIN 3.8 3.7   No results for input(s): LIPASE, AMYLASE in the last 168 hours. CBC: Recent Labs  Lab 11/07/21 1520 11/09/21 0820  WBC 5.6  --   HGB 11.3* 12.6  HCT 35.2* 37.0  MCV 92.6  --   PLT 209  --    Blood Culture    Component Value Date/Time   SDES THROAT 03/26/2016 1200   SPECREQUEST NONE 03/26/2016 1200   CULT NO GROUP A STREP (S.PYOGENES) ISOLATED 03/26/2016 1200   REPTSTATUS 03/28/2016 FINAL 03/26/2016 1200    Cardiac Enzymes: No results for input(s): CKTOTAL, CKMB, CKMBINDEX, TROPONINI in the last 168 hours. CBG: No results for input(s): GLUCAP in the last 168 hours. Iron Studies: No results for input(s): IRON, TIBC, TRANSFERRIN, FERRITIN in the last 72 hours. Lab Results  Component Value Date   INR 1.0 12/30/2018   INR 0.83 11/20/2016   INR 1.23 08/03/2015   Studies/Results: No results found.  Medications:  sodium chloride 10 mL/hr at 11/09/21 1510   sodium chloride Stopped (11/09/21  1511)   calcium gluconate      amLODipine  10 mg Oral QHS   calcitRIOL  1.5 mcg Oral Daily   calcium carbonate  400 mg of elemental calcium Oral TID BM   Chlorhexidine Gluconate Cloth  6 each Topical Q0600   ferric citrate  630 mg Oral TID WC   levothyroxine  50 mcg Oral QAC breakfast    Dialysis Orders: Unit: [facility] MWF  Time: 3:45 EDW: 41 kg  Flows: 350/500 Bath: 2K/2Ca  Access: R AVF  Heparin: 1700 U  ESA: Mircera 100 q 2 wks (last 1/16) Fe:  Venofer 100 mg x 10 (until 11/24/21)  VDRA: None   Assessment/Plan: Secondary Hyperparathyroidism S/P Total parathyroidectomy with autotransplant 11/09/21 by Dr. Harlow Asa. Expect drop in calcium following PTHectomy. Ca trended down-now 6.5. Will dialyze with 3.5Ca bath and continue daily calcitriol 1.48mcg. CaCO3 recently raised to 2 tabs tid between meals. IV 2gm calcium gluconate ordered today and trending Ca levels-monitor  trends closely. ESRD -  HD MWF. HD today per usual schedule with added Ca bath. Next HD 11/13/21. Hypertension/volume  - Continue home amlodipine and monitor BP trend. UF to EDW.  Anemia  - Last Hgb 12.6. No ESA needs. Will check CBC in AM Metabolic bone disease - Plan to switch to calcium binder. PO4 at goal.  Vaginal itchiness/discharge: plan to obtain U/A with urine culture. More likely can follow-up with outpatient PCP Dispo: Discharge cancelled d/t low Ca level. See above for updated plan. Watching calcium levels closely. Plan for HD tomorrow.  Tobie Poet, NP [facility] 11/12/2021,11:06 AM  LOS: 3 days

## 2021-11-12 NOTE — Plan of Care (Signed)
  Problem: Clinical Measurements: Goal: Will remain free from infection Outcome: Not Progressing   Problem: Nutrition: Goal: Adequate nutrition will be maintained Outcome: Not Progressing   Problem: Pain Managment: Goal: General experience of comfort will improve Outcome: Not Progressing   

## 2021-11-13 LAB — CBC WITH DIFFERENTIAL/PLATELET
Abs Immature Granulocytes: 0.04 10*3/uL (ref 0.00–0.07)
Basophils Absolute: 0 10*3/uL (ref 0.0–0.1)
Basophils Relative: 0 %
Eosinophils Absolute: 0.1 10*3/uL (ref 0.0–0.5)
Eosinophils Relative: 2 %
HCT: 31.6 % — ABNORMAL LOW (ref 36.0–46.0)
Hemoglobin: 10.3 g/dL — ABNORMAL LOW (ref 12.0–15.0)
Immature Granulocytes: 1 %
Lymphocytes Relative: 16 %
Lymphs Abs: 1.1 10*3/uL (ref 0.7–4.0)
MCH: 29.5 pg (ref 26.0–34.0)
MCHC: 32.6 g/dL (ref 30.0–36.0)
MCV: 90.5 fL (ref 80.0–100.0)
Monocytes Absolute: 0.8 10*3/uL (ref 0.1–1.0)
Monocytes Relative: 12 %
Neutro Abs: 4.7 10*3/uL (ref 1.7–7.7)
Neutrophils Relative %: 69 %
Platelets: 200 10*3/uL (ref 150–400)
RBC: 3.49 MIL/uL — ABNORMAL LOW (ref 3.87–5.11)
RDW: 17.4 % — ABNORMAL HIGH (ref 11.5–15.5)
WBC: 6.9 10*3/uL (ref 4.0–10.5)
nRBC: 0 % (ref 0.0–0.2)

## 2021-11-13 LAB — RENAL FUNCTION PANEL
Albumin: 3.5 g/dL (ref 3.5–5.0)
Anion gap: 14 (ref 5–15)
BUN: 58 mg/dL — ABNORMAL HIGH (ref 6–20)
CO2: 24 mmol/L (ref 22–32)
Calcium: 6.4 mg/dL — CL (ref 8.9–10.3)
Chloride: 88 mmol/L — ABNORMAL LOW (ref 98–111)
Creatinine, Ser: 12.08 mg/dL — ABNORMAL HIGH (ref 0.44–1.00)
GFR, Estimated: 4 mL/min — ABNORMAL LOW (ref >60)
Glucose, Bld: 100 mg/dL — ABNORMAL HIGH (ref 70–99)
Phosphorus: 4.7 mg/dL — ABNORMAL HIGH (ref 2.5–4.6)
Potassium: 5.2 mmol/L — ABNORMAL HIGH (ref 3.5–5.1)
Sodium: 126 mmol/L — ABNORMAL LOW (ref 135–145)

## 2021-11-13 LAB — CALCIUM: Calcium: 8.2 mg/dL — ABNORMAL LOW (ref 8.9–10.3)

## 2021-11-13 MED ORDER — CALCITRIOL 0.5 MCG PO CAPS
1.5000 ug | ORAL_CAPSULE | Freq: Two times a day (BID) | ORAL | Status: DC
  Administered 2021-11-13 – 2021-11-14 (×3): 1.5 ug via ORAL
  Filled 2021-11-13 (×3): qty 3

## 2021-11-13 MED ORDER — CALCIUM CARBONATE ANTACID 500 MG PO CHEW
800.0000 mg | CHEWABLE_TABLET | Freq: Three times a day (TID) | ORAL | Status: DC
  Administered 2021-11-13 – 2021-11-14 (×3): 800 mg via ORAL
  Filled 2021-11-13 (×3): qty 4

## 2021-11-13 MED ORDER — CALCIUM ACETATE (PHOS BINDER) 667 MG PO CAPS
2001.0000 mg | ORAL_CAPSULE | Freq: Three times a day (TID) | ORAL | Status: DC
  Administered 2021-11-13 – 2021-11-14 (×3): 2001 mg via ORAL
  Filled 2021-11-13 (×4): qty 3

## 2021-11-13 NOTE — Progress Notes (Addendum)
4 Days Post-Op   Subjective/Chief Complaint: No new complaints. Family at bedside. Taking in a little more PO despite neck/throat soreness.   Objective: Vital signs in last 24 hours: Temp:  [97.5 F (36.4 C)-98.3 F (36.8 C)] 97.8 F (36.6 C) (01/23 1130) Pulse Rate:  [76-105] 105 (01/23 1130) Resp:  [11-24] 20 (01/23 1130) BP: (146-183)/(76-101) 172/82 (01/23 1130) SpO2:  [94 %-98 %] 98 % (01/23 1130) Weight:  [40.1 kg-42.8 kg] 40.1 kg (01/23 1130)    Intake/Output from previous day: 01/22 0701 - 01/23 0700 In: 300 [P.O.:300] Out: -  Intake/Output this shift: Total I/O In: -  Out: 2500 [Other:2500]  Neck - soft, mild soft tissue swelling; no hematoma or seroma Ext - symmetrical, incision left forearm clean without cellulitis or significant edema  Lab Results:  Recent Labs    11/13/21 0552  WBC 6.9  HGB 10.3*  HCT 31.6*  PLT 200   BMET Recent Labs    11/12/21 0812 11/12/21 1329 11/12/21 2046 11/13/21 0552  NA 125*  --   --  126*  K 4.9  --   --  5.2*  CL 90*  --   --  88*  CO2 22  --   --  24  GLUCOSE 91  --   --  100*  BUN 44*  --   --  58*  CREATININE 10.22*  --   --  12.08*  CALCIUM 6.5*   < > 6.6* 6.4*   < > = values in this interval not displayed.   PT/INR No results for input(s): LABPROT, INR in the last 72 hours. ABG No results for input(s): PHART, HCO3 in the last 72 hours.  Invalid input(s): PCO2, PO2  Studies/Results: No results found.  Anti-infectives: Anti-infectives (From admission, onward)    Start     Dose/Rate Route Frequency Ordered Stop   11/09/21 0745  ceFAZolin (ANCEF) IVPB 2g/100 mL premix        2 g 200 mL/hr over 30 Minutes Intravenous On call to O.R. 11/09/21 0734 11/09/21 1000   11/09/21 0738  ceFAZolin (ANCEF) 2-4 GM/100ML-% IVPB       Note to Pharmacy: Rocky Morel D: cabinet override      11/09/21 0738 11/09/21 1944       Assessment/Plan: POD 4 total parathyroid/autotransplant to left forearm -calcium  6.4, per renal calcium should be >7 on PO meds in order to discharge. -oral pain meds, oob - reg diet -dc home once calcium level increases.  -meds per renal as in their note    Jill Alexanders 11/13/2021

## 2021-11-13 NOTE — Progress Notes (Signed)
[facility] Progress Note   Subjective:    Seen in HD unit. RN assisting with interpretation. Ca 6.4, corrected 6.8. Feels good this am. Denies numbness, tingling. Still some difficulty swallowing.   Objective Vitals:   11/13/21 0723 11/13/21 0743 11/13/21 0800 11/13/21 0830  BP: (!) 162/89 (!) 158/91 (!) 164/99 (!) 174/92  Pulse: 97 94 88 89  Resp: 16 (!) 24 (!) 22 14  Temp: 98.2 F (36.8 C)     TempSrc: Oral     SpO2: 96% 98%    Weight:  42.8 kg    Height:       Physical Exam General: Lying in bed, NAD Heart: Normal S1 and S2; No murmurs, gallops, or rubs Lungs: Clear throughout; No wheezing, rales, or rhonchi Abdomen: Soft and non-tender Extremities: No edema BLLE Neuro: No muscle twitching/jerking Dialysis Access: R AVF (+) Bruit/Thrill  Skin: Incision LL forearm-intact with Dermabond; Incision anterior neck   Filed Weights   11/10/21 0851 11/10/21 1201 11/13/21 0743  Weight: 44.1 kg 41.2 kg 42.8 kg    Intake/Output Summary (Last 24 hours) at 11/13/2021 0925 Last data filed at 11/12/2021 1330 Gross per 24 hour  Intake 300 ml  Output --  Net 300 ml     Additional Objective Labs: Basic Metabolic Panel: Recent Labs  Lab 11/11/21 0422 11/12/21 0812 11/12/21 1329 11/12/21 2046 11/13/21 0552  NA 131* 125*  --   --  126*  K 5.0 4.9  --   --  5.2*  CL 94* 90*  --   --  88*  CO2 26 22  --   --  24  GLUCOSE 98 91  --   --  100*  BUN 24* 44*  --   --  58*  CREATININE 6.64* 10.22*  --   --  12.08*  CALCIUM 7.3* 6.5* 7.0* 6.6* 6.4*  PHOS 4.0 4.1  --   --  4.7*    Liver Function Tests: Recent Labs  Lab 11/11/21 0422 11/12/21 0812 11/13/21 0552  ALBUMIN 3.8 3.7 3.5    No results for input(s): LIPASE, AMYLASE in the last 168 hours. CBC: Recent Labs  Lab 11/07/21 1520 11/09/21 0820 11/13/21 0552  WBC 5.6  --  6.9  NEUTROABS  --   --  4.7  HGB 11.3* 12.6 10.3*  HCT 35.2* 37.0 31.6*  MCV 92.6  --  90.5  PLT 209  --  200    Blood  Culture    Component Value Date/Time   SDES THROAT 03/26/2016 1200   SPECREQUEST NONE 03/26/2016 1200   CULT NO GROUP A STREP (S.PYOGENES) ISOLATED 03/26/2016 1200   REPTSTATUS 03/28/2016 FINAL 03/26/2016 1200    Cardiac Enzymes: No results for input(s): CKTOTAL, CKMB, CKMBINDEX, TROPONINI in the last 168 hours. CBG: No results for input(s): GLUCAP in the last 168 hours. Iron Studies: No results for input(s): IRON, TIBC, TRANSFERRIN, FERRITIN in the last 72 hours. Lab Results  Component Value Date   INR 1.0 12/30/2018   INR 0.83 11/20/2016   INR 1.23 08/03/2015   Studies/Results: No results found.  Medications:  sodium chloride 10 mL/hr at 11/09/21 1510   sodium chloride Stopped (11/09/21 1511)   sodium chloride     sodium chloride      amLODipine  10 mg Oral QHS   calcitRIOL  1.5 mcg Oral Daily   calcium acetate  2,001 mg Oral TID WC   calcium carbonate  800 mg of elemental calcium Oral TID BM  carvedilol  6.25 mg Oral BID WC   Chlorhexidine Gluconate Cloth  6 each Topical Q0600   levothyroxine  50 mcg Oral QAC breakfast    Dialysis Orders: Unit: [facility] MWF  Time: 3:45 EDW: 41 kg  Flows: 350/500 Bath: 2K/2Ca  Access: R AVF  Heparin: 1700 U  ESA: Mircera 100 q 2 wks (last 1/16) Fe:  Venofer 100 mg x 10 (until 11/24/21)  VDRA: None   Assessment/Plan: Secondary Hyperparathyroidism S/P Total parathyroidectomy with autotransplant 11/09/21 by Dr. Harlow Asa. Complicated by hypocalcemia  --S/p 2g IV calcium gluconate on 1/22. 3.5Ca bath with HD. On  800mg  Ca carbonate TID. Will increase calcitriol to 1.5 mcg BID today 1/23.  Corrected calcium 6.8 this am. Continue to trend.  ESRD -  HD MWF. HD today per usual schedule with added Ca bath.  Hypertension/volume  - Continue home amlodipine and monitor BP trend. UF to EDW.  Anemia  - Hgb 10.3 Not on ESA. Follow trends.  Metabolic bone disease - Plan to switch to calcium binder. PO4 at goal.  Dispo: Not stable for discharge until  calcium > 7 on PO meds.    Lynnda Child PA-C [facility] 11/13/2021,9:35 AM

## 2021-11-13 NOTE — Plan of Care (Signed)
  Problem: Clinical Measurements: Goal: Will remain free from infection Outcome: Not Progressing   Problem: Nutrition: Goal: Adequate nutrition will be maintained Outcome: Not Progressing   Problem: Pain Managment: Goal: General experience of comfort will improve Outcome: Not Progressing   

## 2021-11-14 LAB — RENAL FUNCTION PANEL
Albumin: 3.6 g/dL (ref 3.5–5.0)
Anion gap: 15 (ref 5–15)
BUN: 27 mg/dL — ABNORMAL HIGH (ref 6–20)
CO2: 20 mmol/L — ABNORMAL LOW (ref 22–32)
Calcium: 7.6 mg/dL — ABNORMAL LOW (ref 8.9–10.3)
Chloride: 93 mmol/L — ABNORMAL LOW (ref 98–111)
Creatinine, Ser: 6.98 mg/dL — ABNORMAL HIGH (ref 0.44–1.00)
GFR, Estimated: 7 mL/min — ABNORMAL LOW (ref >60)
Glucose, Bld: 82 mg/dL (ref 70–99)
Phosphorus: 3.9 mg/dL (ref 2.5–4.6)
Potassium: 5.9 mmol/L — ABNORMAL HIGH (ref 3.5–5.1)
Sodium: 128 mmol/L — ABNORMAL LOW (ref 135–145)

## 2021-11-14 LAB — BASIC METABOLIC PANEL
Anion gap: 13 (ref 5–15)
BUN: 33 mg/dL — ABNORMAL HIGH (ref 6–20)
CO2: 29 mmol/L (ref 22–32)
Calcium: 7.7 mg/dL — ABNORMAL LOW (ref 8.9–10.3)
Chloride: 89 mmol/L — ABNORMAL LOW (ref 98–111)
Creatinine, Ser: 8.32 mg/dL — ABNORMAL HIGH (ref 0.44–1.00)
GFR, Estimated: 6 mL/min — ABNORMAL LOW (ref >60)
Glucose, Bld: 110 mg/dL — ABNORMAL HIGH (ref 70–99)
Potassium: 4.4 mmol/L (ref 3.5–5.1)
Sodium: 131 mmol/L — ABNORMAL LOW (ref 135–145)

## 2021-11-14 MED ORDER — CARVEDILOL 6.25 MG PO TABS: 6.2500 mg | ORAL_TABLET | Freq: Two times a day (BID) | ORAL | 0 refills | Status: DC

## 2021-11-14 MED ORDER — CALCITRIOL 0.5 MCG PO CAPS: 1.5000 ug | ORAL_CAPSULE | Freq: Two times a day (BID) | ORAL | 0 refills | Status: AC

## 2021-11-14 MED ORDER — CALCIUM CARBONATE ANTACID 500 MG PO CHEW: 800.0000 mg | CHEWABLE_TABLET | Freq: Three times a day (TID) | ORAL | 0 refills | Status: AC

## 2021-11-14 MED ORDER — SODIUM ZIRCONIUM CYCLOSILICATE 10 G PO PACK
10.0000 g | PACK | Freq: Two times a day (BID) | ORAL | Status: DC
  Administered 2021-11-14: 10:00:00 10 g via ORAL
  Filled 2021-11-14: qty 1

## 2021-11-14 MED ORDER — CALCIUM ACETATE (PHOS BINDER) 667 MG PO CAPS: 2001.0000 mg | ORAL_CAPSULE | Freq: Three times a day (TID) | ORAL | 0 refills | Status: AC

## 2021-11-14 NOTE — Progress Notes (Addendum)
[facility] Progress Note   Subjective:    Seen in room. Video interpreter used. No cp/dyspnea. C/o vaginal discharge/itching  Ca 7.6 K 5.9 after dialysis yesterday.   Objective Vitals:   11/13/21 1607 11/13/21 2037 11/14/21 0414 11/14/21 0754  BP: (!) 152/98 (!) 151/97 127/70 133/74  Pulse: 95 93 82 86  Resp: 16 16 19 17   Temp: 98.4 F (36.9 C) 99.1 F (37.3 C) 98.1 F (36.7 C) 98.2 F (36.8 C)  TempSrc: Oral Oral Oral Oral  SpO2: 99% 100% 98% 100%  Weight:      Height:       Physical Exam General: Lying in bed, NAD Heart: Normal S1 and S2; No murmurs, gallops, or rubs Lungs: Clear throughout; No wheezing, rales, or rhonchi Abdomen: Soft and non-tender Extremities: No edema BLLE Neuro: No muscle twitching/jerking Dialysis Access: R AVF (+) Bruit/Thrill  Skin: Incision LL forearm-intact with Dermabond; Incision anterior neck   Filed Weights   11/10/21 1201 11/13/21 0743 11/13/21 1130  Weight: 41.2 kg 42.8 kg 40.1 kg    Intake/Output Summary (Last 24 hours) at 11/14/2021 1027 Last data filed at 11/14/2021 0514 Gross per 24 hour  Intake 120 ml  Output 2500 ml  Net -2380 ml     Additional Objective Labs: Basic Metabolic Panel: Recent Labs  Lab 11/12/21 0812 11/12/21 1329 11/13/21 0552 11/13/21 1604 11/14/21 0058  NA 125*  --  126*  --  128*  K 4.9  --  5.2*  --  5.9*  CL 90*  --  88*  --  93*  CO2 22  --  24  --  20*  GLUCOSE 91  --  100*  --  82  BUN 44*  --  58*  --  27*  CREATININE 10.22*  --  12.08*  --  6.98*  CALCIUM 6.5*   < > 6.4* 8.2* 7.6*  PHOS 4.1  --  4.7*  --  3.9   < > = values in this interval not displayed.    Liver Function Tests: Recent Labs  Lab 11/12/21 0812 11/13/21 0552 11/14/21 0058  ALBUMIN 3.7 3.5 3.6    No results for input(s): LIPASE, AMYLASE in the last 168 hours. CBC: Recent Labs  Lab 11/07/21 1520 11/09/21 0820 11/13/21 0552  WBC 5.6  --  6.9  NEUTROABS  --   --  4.7  HGB 11.3* 12.6 10.3*   HCT 35.2* 37.0 31.6*  MCV 92.6  --  90.5  PLT 209  --  200    Blood Culture    Component Value Date/Time   SDES THROAT 03/26/2016 1200   SPECREQUEST NONE 03/26/2016 1200   CULT NO GROUP A STREP (S.PYOGENES) ISOLATED 03/26/2016 1200   REPTSTATUS 03/28/2016 FINAL 03/26/2016 1200    Cardiac Enzymes: No results for input(s): CKTOTAL, CKMB, CKMBINDEX, TROPONINI in the last 168 hours. CBG: No results for input(s): GLUCAP in the last 168 hours. Iron Studies: No results for input(s): IRON, TIBC, TRANSFERRIN, FERRITIN in the last 72 hours. Lab Results  Component Value Date   INR 1.0 12/30/2018   INR 0.83 11/20/2016   INR 1.23 08/03/2015   Studies/Results: No results found.  Medications:  sodium chloride 10 mL/hr at 11/09/21 1510   sodium chloride Stopped (11/09/21 1511)    amLODipine  10 mg Oral QHS   calcitRIOL  1.5 mcg Oral BID   calcium acetate  2,001 mg Oral TID WC   calcium carbonate  800 mg of elemental calcium  Oral TID BM   carvedilol  6.25 mg Oral BID WC   Chlorhexidine Gluconate Cloth  6 each Topical Q0600   levothyroxine  50 mcg Oral QAC breakfast   sodium zirconium cyclosilicate  10 g Oral BID    Dialysis Orders: Unit: [facility] MWF  Time: 3:45 EDW: 41 kg  Flows: 350/500 Bath: 2K/2Ca  Access: R AVF  Heparin: 1700 U  ESA: Mircera 100 q 2 wks (last 1/16) Fe:  Venofer 100 mg x 10 (until 11/24/21)  VDRA: None   Assessment/Plan: Secondary Hyperparathyroidism S/P Total parathyroidectomy with autotransplant 11/09/21 by Dr. Harlow Asa. Complicated by hypocalcemia  --S/p 2g IV calcium gluconate on 1/22. 3.5Ca bath with HD. On  800mg  Ca carbonate TID. Calcitriol to 1.5 mcg BID. Ca 7.6. Need updated Ca for today -will order Hyperkalemia - K 5.9 post dialysis. Have changed to renal diet. Lokelma 10g today. -will get updated BMET this afternoon  ESRD -  HD MWF. Next HD 1/25 on schedule  Hypertension/volume  - BP improved. Continue home amlodipine and monitor BP trend. UF to EDW.   Anemia  - Hgb 10.3 Not on ESA. Follow trends.  Metabolic bone disease - Plan to switch to calcium binder. Vaginal discharge - has OP GYN appt in Feb - asked her to f/u after discharge from hospital.  Dispo: Pending updated labs today   Lynnda Child PA-C [facility] 11/14/2021,10:27 AM

## 2021-11-14 NOTE — Progress Notes (Signed)
Discharge:  Pt d/c from room via wheelchair, Family member with the pt.  Discharge instructions given to the patient and family members.  No questions from pt,    Pt dressed in street clothes and left with discharge papers and prescriptions sent into the preferred pharmacy  IV d/ced,

## 2021-11-14 NOTE — Progress Notes (Signed)
5 Days Post-Op   Subjective/Chief Complaint: Still sore throat but improving and eating and drinking well with low but improving appetite. Pain well controlled. Had a small amount of blood in her urine recently without pain or fever.  Objective: Vital signs in last 24 hours: Temp:  [98.1 F (36.7 C)-99.1 F (37.3 C)] 98.2 F (36.8 C) (01/24 0754) Pulse Rate:  [82-95] 86 (01/24 0754) Resp:  [16-19] 17 (01/24 0754) BP: (127-152)/(70-98) 133/74 (01/24 0754) SpO2:  [98 %-100 %] 100 % (01/24 0754)    Intake/Output from previous day: 01/23 0701 - 01/24 0700 In: 120 [P.O.:120] Out: 2500  Intake/Output this shift: Total I/O In: 120 [P.O.:120] Out: -   Neck - soft, mild soft tissue swelling; no hematoma or seroma. Incision c/d/i Ext - symmetrical, incision left forearm clean without cellulitis or significant edema  Lab Results:  Recent Labs    11/13/21 0552  WBC 6.9  HGB 10.3*  HCT 31.6*  PLT 200    BMET Recent Labs    11/14/21 0058 11/14/21 1137  NA 128* 131*  K 5.9* 4.4  CL 93* 89*  CO2 20* 29  GLUCOSE 82 110*  BUN 27* 33*  CREATININE 6.98* 8.32*  CALCIUM 7.6* 7.7*    PT/INR No results for input(s): LABPROT, INR in the last 72 hours. ABG No results for input(s): PHART, HCO3 in the last 72 hours.  Invalid input(s): PCO2, PO2  Studies/Results: No results found.  Anti-infectives: Anti-infectives (From admission, onward)    Start     Dose/Rate Route Frequency Ordered Stop   11/09/21 0745  ceFAZolin (ANCEF) IVPB 2g/100 mL premix        2 g 200 mL/hr over 30 Minutes Intravenous On call to O.R. 11/09/21 0734 11/09/21 1000   11/09/21 0738  ceFAZolin (ANCEF) 2-4 GM/100ML-% IVPB       Note to Pharmacy: Rocky Morel D: cabinet override      11/09/21 0738 11/09/21 1944       Assessment/Plan: POD 5 s/p total parathyroid/autotransplant to left forearm -calcium now 7.7, on PO meds per renal. K now 4.4. -oral pain meds, oob - reg diet -meds per renal as  in their note - mild hematuria - afebrile, no dysuria. Discussed to continue to monitor her symptoms for dysuria and fever and to follow up with PCP and nephrology  Discharge today on current calcium meds and norvasc as per renal. Follow up with nephrology and PCP. Will follow up with Dr. Harlow Asa in clinic  Winferd Humphrey, [facility] 11/14/2021, 1:38 PM Please see Amion for pager number during day hours 7:00am-4:30pm

## 2021-11-14 NOTE — Discharge Summary (Signed)
Patient ID: Karla Garner [id number] 09/25/1986 36 y.o.  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 Edrick Oh for surgical evaluation and management of secondary hyperparathyroidism due to end-stage renal disease. Patient is accompanied by a family member who acts as an Astronomer. The patient speaks [place] and limited [place]. The patient has been on hemodialysis for approximately 10 years. Her current access is in her right upper extremity. Patient has developed secondary hyperparathyroidism. Recent laboratory studies show an elevated intact PTH level of 1404, a calcium level of 10.0, and a calcium times phosphorus product of 70. Patient has noted bone and joint discomfort. She is noted chronic fatigue. Patient has had no prior head or neck surgery. She dialyzes at the [facility].  Procedures Dr. Armandina Gemma -  total parathyroidectomy with autotransplant to left brachioradialis muscle - 11/09/2021  Hospital Course:  Patient was secondary hyperparathyroidism due to end-stage renal  disease.  She presented for planned surgery and underwent total parathyroidectomy with autotransplant to the left brachial radialis muscle on 11/09/2021 by Dr. Armandina Gemma.  Patient was admitted to Dr. Harlow Asa service postoperatively and nephrology was consulted.  Patient underwent dialysis here and p.o. calcium medications were adjusted until serum calcium remained greater than 7. When cleared by Nephrology, patient was discharged on 1/24. Reviewed discharge medications with Nephrology to ensure these were accurate.   I was not directly involved in this patient's care and did not see the patient during their hospital stay, therefore the information in this discharge summary was taken entirely from the chart.  Physical Exam: Please see progress note from earlier today  Allergies as of 11/14/2021   No Active Allergies      Medication List     TAKE these medications    amLODipine 10 MG tablet Commonly known as: NORVASC Take 10 mg by mouth at bedtime.   B-Complex/B-12 Tabs Take 1 tablet by mouth daily.   calcitRIOL 0.5 MCG capsule Commonly known as: ROCALTROL Take 3 capsules (1.5 mcg total) by mouth 2 (two) times daily for 14 days.   calcium acetate 667 MG capsule Commonly known as: PHOSLO Take 3 capsules (2,001 mg total) by mouth 3 (three) times daily with meals for 14 days.   calcium carbonate 500 MG chewable tablet Commonly known as: TUMS - dosed in mg elemental calcium Chew 4 tablets (800 mg of elemental calcium total) by mouth 3 (three) times daily between meals for 14 days.   carvedilol 6.25 MG tablet Commonly known as: COREG Take 1 tablet (6.25 mg total) by mouth 2 (two) times daily with a meal for 14 days.   DIALYVITE 800 WITH ZINC 0.8 MG Tabs Take 1 tablet by mouth daily.  diphenhydrAMINE 25 MG tablet Commonly known as: BENADRYL Take 25 mg by mouth every 6 (six) hours as needed for itching.   ferric citrate 1 GM 210 MG(Fe) tablet Commonly known as: AURYXIA Take  420-630 mg by mouth See admin instructions. Take 630 mg with each meal and large snack, take 420 mg with each small snack   ferrous sulfate 325 (65 FE) MG tablet Take 325 mg by mouth daily with breakfast.   levothyroxine 50 MCG tablet Commonly known as: SYNTHROID Take 50 mcg by mouth daily.   megestrol 40 MG tablet Commonly known as: MEGACE Take 1 tablet (40 mg total) by mouth 2 (two) times daily.   multivitamin with minerals Tabs tablet Take 1 tablet by mouth daily.   oxyCODONE 5 MG immediate release tablet Commonly known as: Oxy IR/ROXICODONE Take 1 tablet (5 mg total) by mouth every 4 (four) hours as needed for moderate pain.          Follow-up Information     Armandina Gemma, MD Follow up in 2 week(s).   Specialty: General Surgery Contact information: [place] [address] [phone]         [facility] Follow up.   Specialty: Nephrology Why: For follow up Contact information: [address] [place] [address] [phone]         Lattie Haw, MD Follow up.   Specialty: Family Medicine Why: For follow up Contact information: [address] [place] [address] [phone]                 Signed: Alferd Apa, [facility] 11/14/2021, 2:38 PM Please see Amion for pager number during day hours 7:00am-4:30pm

## 2021-11-14 NOTE — Progress Notes (Signed)
Contacted [facility] to make clinic aware pt for d/c today and will resume care tomorrow.  Melven Sartorius Renal Navigator [phone]

## 2021-11-15 DIAGNOSIS — L299 Pruritus, unspecified: Secondary | ICD-10-CM | POA: Diagnosis not present

## 2021-11-15 DIAGNOSIS — R52 Pain, unspecified: Secondary | ICD-10-CM | POA: Diagnosis not present

## 2021-11-15 DIAGNOSIS — Z992 Dependence on renal dialysis: Secondary | ICD-10-CM | POA: Diagnosis not present

## 2021-11-15 DIAGNOSIS — N2581 Secondary hyperparathyroidism of renal origin: Secondary | ICD-10-CM | POA: Diagnosis not present

## 2021-11-15 DIAGNOSIS — N186 End stage renal disease: Secondary | ICD-10-CM | POA: Diagnosis not present

## 2021-11-17 DIAGNOSIS — N186 End stage renal disease: Secondary | ICD-10-CM | POA: Diagnosis not present

## 2021-11-17 DIAGNOSIS — N2581 Secondary hyperparathyroidism of renal origin: Secondary | ICD-10-CM | POA: Diagnosis not present

## 2021-11-17 DIAGNOSIS — R52 Pain, unspecified: Secondary | ICD-10-CM | POA: Diagnosis not present

## 2021-11-17 DIAGNOSIS — L299 Pruritus, unspecified: Secondary | ICD-10-CM | POA: Diagnosis not present

## 2021-11-17 DIAGNOSIS — Z992 Dependence on renal dialysis: Secondary | ICD-10-CM | POA: Diagnosis not present

## 2021-11-20 DIAGNOSIS — Z992 Dependence on renal dialysis: Secondary | ICD-10-CM | POA: Diagnosis not present

## 2021-11-20 DIAGNOSIS — N186 End stage renal disease: Secondary | ICD-10-CM | POA: Diagnosis not present

## 2021-11-20 DIAGNOSIS — N2581 Secondary hyperparathyroidism of renal origin: Secondary | ICD-10-CM | POA: Diagnosis not present

## 2021-11-21 DIAGNOSIS — N186 End stage renal disease: Secondary | ICD-10-CM | POA: Diagnosis not present

## 2021-11-21 DIAGNOSIS — N042 Nephrotic syndrome with diffuse membranous glomerulonephritis: Secondary | ICD-10-CM | POA: Diagnosis not present

## 2021-11-21 DIAGNOSIS — Z992 Dependence on renal dialysis: Secondary | ICD-10-CM | POA: Diagnosis not present

## 2021-11-22 DIAGNOSIS — Z992 Dependence on renal dialysis: Secondary | ICD-10-CM | POA: Diagnosis not present

## 2021-11-22 DIAGNOSIS — L299 Pruritus, unspecified: Secondary | ICD-10-CM | POA: Diagnosis not present

## 2021-11-22 DIAGNOSIS — N2581 Secondary hyperparathyroidism of renal origin: Secondary | ICD-10-CM | POA: Diagnosis not present

## 2021-11-22 DIAGNOSIS — N186 End stage renal disease: Secondary | ICD-10-CM | POA: Diagnosis not present

## 2021-11-22 DIAGNOSIS — R52 Pain, unspecified: Secondary | ICD-10-CM | POA: Diagnosis not present

## 2021-11-24 DIAGNOSIS — N186 End stage renal disease: Secondary | ICD-10-CM | POA: Diagnosis not present

## 2021-11-24 DIAGNOSIS — N2581 Secondary hyperparathyroidism of renal origin: Secondary | ICD-10-CM | POA: Diagnosis not present

## 2021-11-24 DIAGNOSIS — L299 Pruritus, unspecified: Secondary | ICD-10-CM | POA: Diagnosis not present

## 2021-11-24 DIAGNOSIS — R52 Pain, unspecified: Secondary | ICD-10-CM | POA: Diagnosis not present

## 2021-11-24 DIAGNOSIS — Z992 Dependence on renal dialysis: Secondary | ICD-10-CM | POA: Diagnosis not present

## 2021-11-27 DIAGNOSIS — L299 Pruritus, unspecified: Secondary | ICD-10-CM | POA: Diagnosis not present

## 2021-11-27 DIAGNOSIS — N186 End stage renal disease: Secondary | ICD-10-CM | POA: Diagnosis not present

## 2021-11-27 DIAGNOSIS — Z992 Dependence on renal dialysis: Secondary | ICD-10-CM | POA: Diagnosis not present

## 2021-11-27 DIAGNOSIS — N2581 Secondary hyperparathyroidism of renal origin: Secondary | ICD-10-CM | POA: Diagnosis not present

## 2021-11-29 DIAGNOSIS — N2581 Secondary hyperparathyroidism of renal origin: Secondary | ICD-10-CM | POA: Diagnosis not present

## 2021-11-29 DIAGNOSIS — L299 Pruritus, unspecified: Secondary | ICD-10-CM | POA: Diagnosis not present

## 2021-11-29 DIAGNOSIS — Z992 Dependence on renal dialysis: Secondary | ICD-10-CM | POA: Diagnosis not present

## 2021-11-29 DIAGNOSIS — N186 End stage renal disease: Secondary | ICD-10-CM | POA: Diagnosis not present

## 2021-11-30 DIAGNOSIS — I770 Arteriovenous fistula, acquired: Secondary | ICD-10-CM | POA: Diagnosis not present

## 2021-11-30 DIAGNOSIS — Z95820 Peripheral vascular angioplasty status with implants and grafts: Secondary | ICD-10-CM | POA: Diagnosis not present

## 2021-11-30 DIAGNOSIS — Z01818 Encounter for other preprocedural examination: Secondary | ICD-10-CM | POA: Diagnosis not present

## 2021-11-30 DIAGNOSIS — I120 Hypertensive chronic kidney disease with stage 5 chronic kidney disease or end stage renal disease: Secondary | ICD-10-CM | POA: Diagnosis not present

## 2021-11-30 DIAGNOSIS — N186 End stage renal disease: Secondary | ICD-10-CM | POA: Diagnosis not present

## 2021-12-01 DIAGNOSIS — Z992 Dependence on renal dialysis: Secondary | ICD-10-CM | POA: Diagnosis not present

## 2021-12-01 DIAGNOSIS — L299 Pruritus, unspecified: Secondary | ICD-10-CM | POA: Diagnosis not present

## 2021-12-01 DIAGNOSIS — N2581 Secondary hyperparathyroidism of renal origin: Secondary | ICD-10-CM | POA: Diagnosis not present

## 2021-12-01 DIAGNOSIS — N186 End stage renal disease: Secondary | ICD-10-CM | POA: Diagnosis not present

## 2021-12-04 DIAGNOSIS — Z992 Dependence on renal dialysis: Secondary | ICD-10-CM | POA: Diagnosis not present

## 2021-12-04 DIAGNOSIS — N2581 Secondary hyperparathyroidism of renal origin: Secondary | ICD-10-CM | POA: Diagnosis not present

## 2021-12-04 DIAGNOSIS — N186 End stage renal disease: Secondary | ICD-10-CM | POA: Diagnosis not present

## 2021-12-04 DIAGNOSIS — L299 Pruritus, unspecified: Secondary | ICD-10-CM | POA: Diagnosis not present

## 2021-12-06 DIAGNOSIS — N186 End stage renal disease: Secondary | ICD-10-CM | POA: Diagnosis not present

## 2021-12-06 DIAGNOSIS — L299 Pruritus, unspecified: Secondary | ICD-10-CM | POA: Diagnosis not present

## 2021-12-06 DIAGNOSIS — Z992 Dependence on renal dialysis: Secondary | ICD-10-CM | POA: Diagnosis not present

## 2021-12-06 DIAGNOSIS — N2581 Secondary hyperparathyroidism of renal origin: Secondary | ICD-10-CM | POA: Diagnosis not present

## 2021-12-08 DIAGNOSIS — L299 Pruritus, unspecified: Secondary | ICD-10-CM | POA: Diagnosis not present

## 2021-12-08 DIAGNOSIS — Z992 Dependence on renal dialysis: Secondary | ICD-10-CM | POA: Diagnosis not present

## 2021-12-08 DIAGNOSIS — N2581 Secondary hyperparathyroidism of renal origin: Secondary | ICD-10-CM | POA: Diagnosis not present

## 2021-12-08 DIAGNOSIS — N186 End stage renal disease: Secondary | ICD-10-CM | POA: Diagnosis not present

## 2021-12-11 DIAGNOSIS — Z992 Dependence on renal dialysis: Secondary | ICD-10-CM | POA: Diagnosis not present

## 2021-12-11 DIAGNOSIS — L299 Pruritus, unspecified: Secondary | ICD-10-CM | POA: Diagnosis not present

## 2021-12-11 DIAGNOSIS — N186 End stage renal disease: Secondary | ICD-10-CM | POA: Diagnosis not present

## 2021-12-11 DIAGNOSIS — N2581 Secondary hyperparathyroidism of renal origin: Secondary | ICD-10-CM | POA: Diagnosis not present

## 2021-12-11 NOTE — Progress Notes (Signed)
[facility] New Patient Evaluation and Consultation  Referring Provider: McDiarmid, Blane Ohara, MD PCP: Lattie Haw, MD Date of Service: 12/12/2021  SUBJECTIVE Chief Complaint: New Patient (Initial Visit)  History of Present Illness: Karla Garner is a 36 y.o.  hispanic  female seen in consultation at the request of Dr. McDiarmid for evaluation of dyspareunia and prolapse.    Review of records significant for: Has symptoms of dyspareunia. Has pressure and bleeding after standing for long periods.   Urinary Symptoms:  Currently on dialysis. Does not leak urine.  Day time voids 1.  Nocturia: 0 times per night to void. When urinating, she feels a weak stream and difficulty starting urine stream  UTIs:  0  UTI's in the last year.   Reports history of blood in urine and pyelonephritis Planning for renal transplant in May, on dialysis currently.   Pelvic Organ Prolapse Symptoms:                  She Denies a feeling of a bulge the vaginal area. But has some pressure in the pelvis. Feels like something is going to come down. This has been happing since her last delivery.   Bowel Symptom: Bowel movements: 2 time(s) per day Stool consistency: soft  Straining: no.  Splinting: no.  Incomplete evacuation: no.  She Denies accidental bowel leakage / fecal incontinence Bowel regimen: none  Sexual Function Sexually active: no.  Pain with sex: Yes, deep in the pelvis Also has bleeding with intercourse.   Pelvic Pain Admits to pelvic pain Location: lower abdomen Pain occurs: every times she has sex Prior pain treatment: none Improved by: tylenol   Past Medical History:  Past Medical History:  Diagnosis Date   Anemia    Depression    Dysuria 11/04/2018   ESRD (end stage renal disease) ([place])    from IgA nephritis   Glomerulonephritis    Hemodialysis patient ([place])    Hypertension    Menorrhagia with regular cycle 11/04/2018   Normocytic anemia 08/02/2015    PONV (postoperative nausea and vomiting)    was on dialysis i during pregnancy2014   Swelling of right side of face 11/01/2017   Thrombocytopenia ([place])    07/2015 in setting of sepsis     Past Surgical History:   Past Surgical History:  Procedure Laterality Date   A/V FISTULAGRAM N/A 01/08/2018   Procedure: A/V FISTULAGRAM - right arm;  Surgeon: [name], MD;  Location: [facility];  Service: Cardiovascular;  Laterality: N/A;   AV FISTULA PLACEMENT Left    AV FISTULA PLACEMENT Right 04/14/2015   Procedure: CREATION OF RIGHT RADIOCEPHALIC ARTERIOVENOUS (AV) FISTULA ;  Surgeon: Angelia Mould, MD;  Location: [facility];  Service: Vascular;  Laterality: Right;   AV FISTULA PLACEMENT Right 11/22/2016   Procedure: ARTERIOVENOUS (AV) FISTULA CREATION;  Surgeon: [name], MD;  Location: [place];  Service: Vascular;  Laterality: Right;   CESAREAN SECTION     2009   ESOPHAGOGASTRODUODENOSCOPY N/A 11/24/2016   Procedure: ESOPHAGOGASTRODUODENOSCOPY (EGD);  Surgeon: [name], MD;  Location: [facility];  Service: Endoscopy;  Laterality: N/A;   EXCHANGE OF A DIALYSIS CATHETER Right 04/14/2015   Procedure: EXCHANGE OF RIGHT INTERNAL JUGULAR DIALYSIS CATHETER;  Surgeon: Angelia Mould, MD;  Location: [facility];  Service: Vascular;  Laterality: Right;   INSERTION OF DIALYSIS CATHETER Left 11/22/2016   Procedure: INSERTION OF DIALYSIS CATHETER - LEFT INTERNAL JUGULAR PLACEMENT;  Surgeon: Georgia Dom  Donzetta Matters, MD;  Location: [place];  Service: Vascular;  Laterality: Left;   IR AV DIALY SHUNT INTRO NEEDLE/INTRAC INITIAL W/PTA/STENT/IMG RT Right 02/15/2020   IR AV DIALY SHUNT INTRO NEEDLE/INTRACATH INITIAL W/PTA/IMG LEFT  10/18/2020   IR DIALY SHUNT INTRO NEEDLE/INTRACATH INITIAL W/IMG RIGHT Right 07/26/2017   IR DIALY SHUNT INTRO NEEDLE/INTRACATH INITIAL W/IMG RIGHT Right 10/18/2017   IR DIALY SHUNT INTRO NEEDLE/INTRACATH INITIAL W/IMG RIGHT Right 03/10/2018    IR DIALY SHUNT INTRO NEEDLE/INTRACATH INITIAL W/IMG RIGHT Right 12/30/2018   IR PTA ADDL CENTRAL DIALYSIS SEG THRU DIALY CIRCUIT LEFT Left 10/18/2020   IR RADIOLOGIST EVAL & MGMT  07/02/2019   IR REMOVAL TUN CV CATH W/O FL  03/06/2017   IR TRANSCATH PLC STENT 1ST ART NOT LE CV CAR VERT CAR  12/30/2018   IR US GUIDE VASC ACCESS RIGHT  12/30/2018   IR US GUIDE VASC ACCESS RIGHT  12/30/2018   IR US GUIDE VASC ACCESS RIGHT  02/15/2020   IR VENO/EXT/UNI LEFT  12/30/2018   LIGATION OF ARTERIOVENOUS  FISTULA Left 11/05/2014   Procedure: LIGATION OF LEFT ARM  BRACHIO-CEPHALIC ARTERIOVENOUS  FISTULA ,& REPAIR OF BRACHIAL ARTERY.;  Surgeon: Mal [name], MD;  Location: [facility];  Service: Vascular;  Laterality: Left;   PARATHYROIDECTOMY N/A 11/09/2021   Procedure: TOTAL PARATHYROIDECTOMY;  Surgeon: Armandina Gemma, MD;  Location: [facility];  Service: General;  Laterality: N/A;   PERIPHERAL VASCULAR BALLOON ANGIOPLASTY  01/08/2018   Procedure: PERIPHERAL VASCULAR BALLOON ANGIOPLASTY;  Surgeon: [name], MD;  Location: [facility];  Service: Cardiovascular;;  innominate vein   THROMBECTOMY W/ EMBOLECTOMY Right 11/16/2016   Procedure: THROMBECTOMY REVISION OF ARTERIOVENOUS FISTULA - RIGHT ARM;  Surgeon: [name], MD;  Location: [place];  Service: Vascular;  Laterality: Right;   TUBAL LIGATION  2014     Past OB/GYN History: OB History  Gravida Para Term Preterm AB Living  3 3 1 2   3   SAB IAB Ectopic Multiple Live Births          3    # Outcome Date GA Lbr Len/2nd Weight Sex Delivery Anes PTL Lv  3 Preterm 03/03/13    M CS-LTranv     2 Preterm 08/23/10 [redacted]w[redacted]d  4 lb 4 oz (1.928 kg) M Vag-Spont None Y LIV  1 Term 12/01/07 [redacted]w[redacted]d  5 lb 7 oz (2.466 kg) M CS-Unspec EPI  LIV   Contraception: tubal ligation Last pap: 10/17/21 LSIL   Medications: She has a current medication list which includes the following prescription(s): amlodipine, b-complex/b-12, dialyvite 800 with zinc, cyclobenzaprine,  diphenhydramine, ferric citrate, ferrous sulfate, levothyroxine, lidocaine, multivitamin with minerals, oxycodone, and megestrol.   Allergies: Patient has no active allergies.   Social History:  Social History   Tobacco Use   Smoking status: Never   Smokeless tobacco: Never  Vaping Use   Vaping Use: Never used  Substance Use Topics   Alcohol use: No    Alcohol/week: 0.0 standard drinks   Drug use: No     Family History:   Family History  Problem Relation Age of Onset   Other Mother        no medical problems per patient   Other Father        no medical problems per patient   Colon cancer Neg Hx    Liver cancer Neg Hx    Stomach cancer Neg Hx      Review of Systems: Review of Systems  Constitutional:  Negative for fever, malaise/fatigue and weight loss.  Respiratory:  Negative for cough, shortness of breath and wheezing.   Cardiovascular:  Negative for chest pain, palpitations and leg swelling.  Gastrointestinal:  Negative for abdominal pain and blood in stool.  Genitourinary:  Negative for dysuria.  Musculoskeletal:  Negative for myalgias.  Skin:  Negative for rash.  Neurological:  Negative for dizziness and headaches.  Endo/Heme/Allergies:  Does not bruise/bleed easily.  Psychiatric/Behavioral:  Negative for depression. The patient is not nervous/anxious.     OBJECTIVE Physical Exam: Vitals:   12/12/21 0956 12/12/21 1005  BP: (!) 214/113 (!) 205/128  Pulse: (!) 113 91  Weight: 88 lb (39.9 kg)   Height: 4\' 9"  (1.448 m)     Physical Exam Constitutional:      General: She is not in acute distress. Pulmonary:     Effort: Pulmonary effort is normal.  Abdominal:     General: There is no distension.     Palpations: Abdomen is soft.     Tenderness: There is no abdominal tenderness. There is no rebound.  Musculoskeletal:        General: No swelling. Normal range of motion.  Skin:    General: Skin is warm and dry.     Findings: No rash.  Neurological:      Mental Status: She is alert and oriented to person, place, and time.  Psychiatric:        Mood and Affect: Mood normal.        Behavior: Behavior normal.     GU / Detailed Urogynecologic Evaluation:  Pelvic Exam: Normal external female genitalia; Bartholin's and Skene's glands normal in appearance; urethral meatus normal in appearance, no urethral masses or discharge.   CST: negative  Q tip test- tenderness at introitus on the right  Speculum exam reveals normal vaginal mucosa without atrophy. Cervix normal appearance. Uterus normal single, nontender. Adnexa no mass, fullness, tenderness.      Pelvic floor strength II/V  Pelvic floor musculature: Very high tone throughout. Right levator tender, Right obturator tender, Left levator tender, Left obturator tender  POP-Q:   POP-Q  -3                                            Aa   -3                                           Ba  -8                                              C   3                                            Gh  2                                            Pb  8  tvl   -3                                            Ap  -3                                            Bp  -8                                              D     Rectal Exam:  Normal external rectum  Post-Void Residual (PVR) by Bladder Scan: In order to evaluate bladder emptying, we discussed obtaining a postvoid residual and she agreed to this procedure.  Procedure: The ultrasound unit was placed on the patient's abdomen in the suprapubic region after the patient had voided. A PVR of 0 ml was obtained by bladder scan.  Laboratory Results: Unable to provide urine sample  ASSESSMENT AND PLAN Ms. Giannie Soliday is a 36 y.o. with:  1. Pelvic pain   2. Levator spasm   3. Dyspareunia in female    - Significant pelvic floor muscle spasm present.  - The origin of pelvic floor muscle spasm can be  multifactorial, including primary, reactive to a different pain source, trauma, or even part of a centralized pain syndrome.Treatment options include pelvic floor physical therapy, local (vaginal) or oral  muscle relaxants, pelvic muscle trigger point injections or centrally acting pain medications.   - Will prescribe flexeril 5mg  to take as needed at night  - referral also placed to pelvic floor PT - Lidocaine jelly 2% prescribed to place at introitus prior to intercourse to help with penetration. Also reviewed option of progressive vaginal dilators, but she will wait until she has had some PT to consider this.   Return 6 months or sooner if needed   [name], MD   Medical Decision Making:   - Review and summation of prior records

## 2021-12-12 ENCOUNTER — Other Ambulatory Visit: Payer: Self-pay

## 2021-12-12 ENCOUNTER — Encounter: Payer: Self-pay | Admitting: Obstetrics and Gynecology

## 2021-12-12 ENCOUNTER — Ambulatory Visit: Payer: BC Managed Care – PPO | Admitting: Obstetrics and Gynecology

## 2021-12-12 ENCOUNTER — Ambulatory Visit: Payer: BC Managed Care – PPO | Attending: Internal Medicine | Admitting: Internal Medicine

## 2021-12-12 ENCOUNTER — Encounter: Payer: Self-pay | Admitting: Internal Medicine

## 2021-12-12 VITALS — BP 215/143 | HR 80 | Resp 16 | Ht <= 58 in | Wt 95.4 lb

## 2021-12-12 VITALS — BP 205/128 | HR 91 | Ht <= 58 in | Wt 88.0 lb

## 2021-12-12 DIAGNOSIS — Z992 Dependence on renal dialysis: Secondary | ICD-10-CM

## 2021-12-12 DIAGNOSIS — M62838 Other muscle spasm: Secondary | ICD-10-CM

## 2021-12-12 DIAGNOSIS — N186 End stage renal disease: Secondary | ICD-10-CM

## 2021-12-12 DIAGNOSIS — N2581 Secondary hyperparathyroidism of renal origin: Secondary | ICD-10-CM | POA: Diagnosis not present

## 2021-12-12 DIAGNOSIS — Z7689 Persons encountering health services in other specified circumstances: Secondary | ICD-10-CM | POA: Diagnosis not present

## 2021-12-12 DIAGNOSIS — R102 Pelvic and perineal pain: Secondary | ICD-10-CM

## 2021-12-12 DIAGNOSIS — E039 Hypothyroidism, unspecified: Secondary | ICD-10-CM

## 2021-12-12 DIAGNOSIS — I129 Hypertensive chronic kidney disease with stage 1 through stage 4 chronic kidney disease, or unspecified chronic kidney disease: Secondary | ICD-10-CM | POA: Diagnosis not present

## 2021-12-12 DIAGNOSIS — N941 Unspecified dyspareunia: Secondary | ICD-10-CM

## 2021-12-12 DIAGNOSIS — E892 Postprocedural hypoparathyroidism: Secondary | ICD-10-CM

## 2021-12-12 MED ORDER — CYCLOBENZAPRINE HCL 5 MG PO TABS: 5.0000 mg | ORAL_TABLET | Freq: Three times a day (TID) | ORAL | 1 refills | Status: DC | PRN

## 2021-12-12 MED ORDER — LIDOCAINE HCL 2 % EX GEL: 1.0000 "application " | CUTANEOUS | 0 refills | Status: DC | PRN

## 2021-12-12 NOTE — Patient Instructions (Signed)
Your blood pressure is very elevated.  Your level is 221/149 on the right leg and 213/151 on the left leg.  Even though you are not having any symptoms, I recommend that you be seen in the emergency room today for them to help get your blood pressure down.

## 2021-12-12 NOTE — Patient Instructions (Signed)
Place lidocaine jelly for a few minutes vaginally prior to intercourse.   Take flexeril 5mg  nightly as needed for pain. Can take up to twice a day.

## 2021-12-12 NOTE — Progress Notes (Signed)
Patient ID: Karla Garner, female    DOB: Oct 19, 1986  MRN: [id number]  CC: New Patient (Initial Visit)   Subjective: Karla Garner is a 36 y.o. female who presents for new pt visit.  Interpreter, Mickel Baas, from [employer], is with her and interprets Her concerns today include:  Patient with history of ESRD on HD, HTN, secondary hyperparathyroidism status post total parathyroidectomy with autotransplant brachioradialis muscle, ACD, hypothyroid  Pt here to est care.  She did not bring meds with her. Pt hosp last mth for total parathyroidectomy due to severe secondary hyperparathyroidism.  Procedure done by Dr. Harlow Asa.  She was suppose to f/u with Dr. Harlow Asa 2 wks post surgery but has not done so; was not aware that she was to call for appt. -reports facial twitching for about 20 sec about once a day -Ca+ level is checked in HD.  Told it was low around 6.7 a few days ago.  Told by nephrologist to continue Calcitriol which was initially prescribed for only 2 wks post surgery Confirms taking Phoslo 3 cap TID, Calcitriol 0.5 mcg 3 caps once a day (reports was RF by nephrologist recently at lower dose) and TUMS (not sure of dose or frequency) -confirms taking Levothyroxine 50 mcg daily x 6-7 mths.  Started on it by nephrologist.  -feels surgical wound has healed up  ESRD:  on HD Q M/W/F.  On HD x 11 yrs On Norvasc 10 mg for HTN.  She does not feel it is lowering her BP. BP at HD yesterday was 139/83.   No device at home to check BP.   Takes Norvasc at bedtime.   Blood pressure very elevated today.  She denies any chest pains, shortness of breath or lower extremity swelling. Patient Active Problem List   Diagnosis Date Noted   Secondary hyperparathyroidism of renal origin ([place]) 11/09/2021   Hyperparathyroidism, secondary ([place]) 11/05/2021   Cervical cancer screening 10/17/2021   Routine screening for STI (sexually transmitted infection) 10/17/2021   Uterine prolapse  10/17/2021   Dyspareunia in female 10/17/2021   Anxiety state 06/05/2018   Healthcare maintenance 11/21/2017   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   Depression 11/01/2017   Acid reflux 11/01/2017   ESRD on hemodialysis ([place])    Anemia of chronic kidney failure 04/09/2015   Hypertension 04/30/2013   Glomerulonephritis, IgA 04/30/2013     Current Outpatient Medications on File Prior to Visit  Medication Sig Dispense Refill   amLODipine (NORVASC) 10 MG tablet Take 10 mg by mouth at bedtime.     B Complex Vitamins (B-COMPLEX/B-12) TABS Take 1 tablet by mouth daily.     B Complex-C-Zn-Folic Acid (DIALYVITE 814 WITH ZINC) 0.8 MG TABS Take 1 tablet by mouth daily.     diphenhydrAMINE (BENADRYL) 25 MG tablet Take 25 mg by mouth every 6 (six) hours as needed for itching.     ferric citrate (AURYXIA) 1 GM 210 MG(Fe) tablet Take 420-630 mg by mouth See admin instructions. Take 630 mg with each meal and large snack, take 420 mg with each small snack     ferrous sulfate 325 (65 FE) MG tablet Take 325 mg by mouth daily with breakfast.     levothyroxine (SYNTHROID) 50 MCG tablet Take 50 mcg by mouth daily.     megestrol (MEGACE) 40 MG tablet Take 1 tablet (40 mg total) by mouth 2 (two) times daily. (Patient not taking: Reported on 02/10/2020) 60 tablet 0   Multiple Vitamin (MULTIVITAMIN WITH MINERALS) TABS  tablet Take 1 tablet by mouth daily.     oxyCODONE (OXY IR/ROXICODONE) 5 MG immediate release tablet Take 1 tablet (5 mg total) by mouth every 4 (four) hours as needed for moderate pain. 10 tablet 0   No current facility-administered medications on file prior to visit.    No Known Allergies  Social History   Socioeconomic History   Marital status: Legally Separated    Spouse name: Not on file   Number of children: 3   Years of education: Not on file   Highest education level: Not on file  Occupational History   Occupation: unemployed  Tobacco Use   Smoking status: Never   Smokeless  tobacco: Never  Vaping Use   Vaping Use: Never used  Substance and Sexual Activity   Alcohol use: No    Alcohol/week: 0.0 standard drinks   Drug use: No   Sexual activity: Not Currently    Partners: Male  Other Topics Concern   Not on file  Social History Narrative   As of February 2018 she has 3 children with whom she lives they are aged 42, 63 and 2.    Social Determinants of Health   Financial Resource Strain: Not on file  Food Insecurity: Not on file  Transportation Needs: Not on file  Physical Activity: Not on file  Stress: Not on file  Social Connections: Not on file  Intimate Partner Violence: Not on file    Family History  Problem Relation Age of Onset   Other Mother        no medical problems per patient   Other Father        no medical problems per patient   Colon cancer Neg Hx    Liver cancer Neg Hx    Stomach cancer Neg Hx     Past Surgical History:  Procedure Laterality Date   A/V FISTULAGRAM N/A 01/08/2018   Procedure: A/V FISTULAGRAM - right arm;  Surgeon: [name], MD;  Location: [facility];  Service: Cardiovascular;  Laterality: N/A;   AV FISTULA PLACEMENT Left    AV FISTULA PLACEMENT Right 04/14/2015   Procedure: CREATION OF RIGHT RADIOCEPHALIC ARTERIOVENOUS (AV) FISTULA ;  Surgeon: Angelia Mould, MD;  Location: [place];  Service: Vascular;  Laterality: Right;   AV FISTULA PLACEMENT Right 11/22/2016   Procedure: ARTERIOVENOUS (AV) FISTULA CREATION;  Surgeon: [name], MD;  Location: [place];  Service: Vascular;  Laterality: Right;   CESAREAN SECTION     2009   ESOPHAGOGASTRODUODENOSCOPY N/A 11/24/2016   Procedure: ESOPHAGOGASTRODUODENOSCOPY (EGD);  Surgeon: [name], MD;  Location[facility];  Service: Endoscopy;  Laterality: N/A;   EXCHANGE OF A DIALYSIS CATHETER Right 04/14/2015   Procedure: EXCHANGE OF RIGHT INTERNAL JUGULAR DIALYSIS CATHETER;  Surgeon: Angelia Mould, MD;  Location: [place];   Service: Vascular;  Laterality: Right;   INSERTION OF DIALYSIS CATHETER Left 11/22/2016   Procedure: INSERTION OF DIALYSIS CATHETER - LEFT INTERNAL JUGULAR PLACEMENT;  Surgeon: [name], MD;  Location: [place];  Service: Vascular;  Laterality: Left;   IR AV DIALY SHUNT INTRO NEEDLE/INTRAC INITIAL W/PTA/STENT/IMG RT Right 02/15/2020   IR AV DIALY SHUNT INTRO NEEDLE/INTRACATH INITIAL W/PTA/IMG LEFT  10/18/2020   IR DIALY SHUNT INTRO NEEDLE/INTRACATH INITIAL W/IMG RIGHT Right 07/26/2017   IR DIALY SHUNT INTRO NEEDLE/INTRACATH INITIAL W/IMG RIGHT Right 10/18/2017   IR DIALY SHUNT INTRO NEEDLE/INTRACATH INITIAL W/IMG RIGHT Right 03/10/2018   IR DIALY SHUNT INTRO NEEDLE/INTRACATH  INITIAL W/IMG RIGHT Right 12/30/2018   IR PTA ADDL CENTRAL DIALYSIS SEG THRU DIALY CIRCUIT LEFT Left 10/18/2020   IR RADIOLOGIST EVAL & MGMT  07/02/2019   IR REMOVAL TUN CV CATH W/O FL  03/06/2017   IR TRANSCATH PLC STENT 1ST ART NOT LE CV CAR VERT CAR  12/30/2018   IR US GUIDE VASC ACCESS RIGHT  12/30/2018   IR US GUIDE VASC ACCESS RIGHT  12/30/2018   IR US GUIDE VASC ACCESS RIGHT  02/15/2020   IR VENO/EXT/UNI LEFT  12/30/2018   LIGATION OF ARTERIOVENOUS  FISTULA Left 11/05/2014   Procedure: LIGATION OF LEFT ARM  BRACHIO-CEPHALIC ARTERIOVENOUS  FISTULA ,& REPAIR OF BRACHIAL ARTERY.;  Surgeon: Mal [name], MD;  Location: [place];  Service: Vascular;  Laterality: Left;   PARATHYROIDECTOMY N/A 11/09/2021   Procedure: TOTAL PARATHYROIDECTOMY;  Surgeon: Armandina Gemma, MD;  Location: [place];  Service: General;  Laterality: N/A;   PERIPHERAL VASCULAR BALLOON ANGIOPLASTY  01/08/2018   Procedure: PERIPHERAL VASCULAR BALLOON ANGIOPLASTY;  Surgeon: [name], MD;  Location: [place] [facility];  Service: Cardiovascular;;  innominate vein   THROMBECTOMY W/ EMBOLECTOMY Right 11/16/2016   Procedure: THROMBECTOMY REVISION OF ARTERIOVENOUS FISTULA - RIGHT ARM;  Surgeon: [name], MD;  Location: [place];  Service: Vascular;   Laterality: Right;   TUBAL LIGATION  2014    ROS: Review of Systems Negative except as stated above  PHYSICAL EXAM: BP (!) 215/143    Pulse 80    Resp 16    Ht 4\' 9"  (1.448 m)    Wt 95 lb 6.4 oz (43.3 kg)    SpO2 100%    BMI 20.64 kg/m   Physical Exam Repeat blood pressure 213/151 left foot 221/149 right foot General appearance - alert, well appearing, young Hispanic female and in no distress Mental status - normal mood, behavior, speech, dress, motor activity, and thought processes Mouth - mucous membranes moist, pharynx normal without lesions Neck -patient has a healing surgical scar on the anterior neck.  No erythema or signs of wound dehiscence. Chest - clear to auscultation, no wheezes, rales or rhonchi, symmetric air entry Heart - normal rate, regular rhythm, normal S1, S2, no murmurs, rubs, clicks or gallops Extremities -right upper extremity: Serpiginous raised hemodialysis graft in the right upper arm.  Healed surgical scar about 4 cm in size the left forearm.   CMP Latest Ref Rng & Units 11/14/2021 11/14/2021 11/13/2021  Glucose 70 - 99 mg/dL 110(H) 82 -  BUN 6 - 20 mg/dL 33(H) 27(H) -  Creatinine 0.44 - 1.00 mg/dL 8.32(H) 6.98(H) -  Sodium 135 - 145 mmol/L 131(L) 128(L) -  Potassium 3.5 - 5.1 mmol/L 4.4 5.9(H) -  Chloride 98 - 111 mmol/L 89(L) 93(L) -  CO2 22 - 32 mmol/L 29 20(L) -  Calcium 8.9 - 10.3 mg/dL 7.7(L) 7.6(L) 8.2(L)  Total Protein 6.5 - 8.1 g/dL - - -  Total Bilirubin 0.3 - 1.2 mg/dL - - -  Alkaline Phos 38 - 126 U/L - - -  AST 15 - 41 U/L - - -  ALT 14 - 54 U/L - - -   Lipid Panel     Component Value Date/Time   CHOL 82 11/17/2016 0517   TRIG 71 11/17/2016 0517   HDL 23 (L) 11/17/2016 0517   CHOLHDL 3.6 11/17/2016 0517   VLDL 14 11/17/2016 0517   LDLCALC 45 11/17/2016 0517    CBC    Component Value Date/Time  WBC 6.9 11/13/2021 0552   RBC 3.49 (L) 11/13/2021 0552   HGB 10.3 (L) 11/13/2021 0552   HGB 12.5 12/03/2018 1655   HCT 31.6 (L)  11/13/2021 0552   HCT 36.6 12/03/2018 1655   PLT 200 11/13/2021 0552   PLT 282 12/03/2018 1655   MCV 90.5 11/13/2021 0552   MCV 92 12/03/2018 1655   MCH 29.5 11/13/2021 0552   MCHC 32.6 11/13/2021 0552   RDW 17.4 (H) 11/13/2021 0552   RDW 15.9 (H) 12/03/2018 1655   LYMPHSABS 1.1 11/13/2021 0552   MONOABS 0.8 11/13/2021 0552   EOSABS 0.1 11/13/2021 0552   BASOSABS 0.0 11/13/2021 0552    ASSESSMENT AND PLAN: 1. Encounter to establish care   2. Renal hypertension Both systolic and diastolic blood pressure is significantly elevated today.  I recommend that she be seen in the emergency room for lowering of BP under monitoring even though she is asymptomatic at this time.  3. ESRD on hemodialysis [facility]) Patient on hemodialysis Monday Wednesday and Fridays.  4. Secondary hyperparathyroidism of renal origin ([place]) Advised to bring her medicines with her on her next visit for verification of dose and frequency. - Ambulatory referral to Endocrinology  5. S/P parathyroidectomy ([place]) Patient to continue Rocaltrol and Tums.  I will try to get her in with Dr. Harlow Asa for follow-up as soon as possible. - Ambulatory referral to General Surgery - Basic Metabolic Panel; Future - Ambulatory referral to Endocrinology  6. Acquired hypothyroidism Patient on levothyroxine.  We will check thyroid level. - TSH+T4F+T3Free; Future - Ambulatory referral to Endocrinology    Patient was given the opportunity to ask questions.  Patient verbalized understanding of the plan and was able to repeat key elements of the plan.   Orders Placed This Encounter  Procedures   [id number]   Basic Metabolic Panel   Ambulatory referral to General Surgery   Ambulatory referral to Endocrinology     Requested Prescriptions    No prescriptions requested or ordered in this encounter    Return in about 2 months (around 02/09/2022).  Karle Plumber, MD, FACP

## 2021-12-13 DIAGNOSIS — N186 End stage renal disease: Secondary | ICD-10-CM | POA: Diagnosis not present

## 2021-12-13 DIAGNOSIS — N2581 Secondary hyperparathyroidism of renal origin: Secondary | ICD-10-CM | POA: Diagnosis not present

## 2021-12-13 DIAGNOSIS — Z992 Dependence on renal dialysis: Secondary | ICD-10-CM | POA: Diagnosis not present

## 2021-12-13 DIAGNOSIS — L299 Pruritus, unspecified: Secondary | ICD-10-CM | POA: Diagnosis not present

## 2021-12-15 DIAGNOSIS — L299 Pruritus, unspecified: Secondary | ICD-10-CM | POA: Diagnosis not present

## 2021-12-15 DIAGNOSIS — N186 End stage renal disease: Secondary | ICD-10-CM | POA: Diagnosis not present

## 2021-12-15 DIAGNOSIS — N2581 Secondary hyperparathyroidism of renal origin: Secondary | ICD-10-CM | POA: Diagnosis not present

## 2021-12-15 DIAGNOSIS — Z992 Dependence on renal dialysis: Secondary | ICD-10-CM | POA: Diagnosis not present

## 2021-12-18 DIAGNOSIS — N2581 Secondary hyperparathyroidism of renal origin: Secondary | ICD-10-CM | POA: Diagnosis not present

## 2021-12-18 DIAGNOSIS — N186 End stage renal disease: Secondary | ICD-10-CM | POA: Diagnosis not present

## 2021-12-18 DIAGNOSIS — Z992 Dependence on renal dialysis: Secondary | ICD-10-CM | POA: Diagnosis not present

## 2021-12-19 ENCOUNTER — Encounter: Payer: BC Managed Care – PPO | Admitting: Physical Therapy

## 2021-12-19 DIAGNOSIS — Z992 Dependence on renal dialysis: Secondary | ICD-10-CM | POA: Diagnosis not present

## 2021-12-19 DIAGNOSIS — N186 End stage renal disease: Secondary | ICD-10-CM | POA: Diagnosis not present

## 2021-12-19 DIAGNOSIS — N042 Nephrotic syndrome with diffuse membranous glomerulonephritis: Secondary | ICD-10-CM | POA: Diagnosis not present

## 2021-12-20 DIAGNOSIS — R52 Pain, unspecified: Secondary | ICD-10-CM | POA: Diagnosis not present

## 2021-12-20 DIAGNOSIS — N186 End stage renal disease: Secondary | ICD-10-CM | POA: Diagnosis not present

## 2021-12-20 DIAGNOSIS — N2581 Secondary hyperparathyroidism of renal origin: Secondary | ICD-10-CM | POA: Diagnosis not present

## 2021-12-20 DIAGNOSIS — Z992 Dependence on renal dialysis: Secondary | ICD-10-CM | POA: Diagnosis not present

## 2021-12-20 DIAGNOSIS — L299 Pruritus, unspecified: Secondary | ICD-10-CM | POA: Diagnosis not present

## 2021-12-22 DIAGNOSIS — Z992 Dependence on renal dialysis: Secondary | ICD-10-CM | POA: Diagnosis not present

## 2021-12-22 DIAGNOSIS — N2581 Secondary hyperparathyroidism of renal origin: Secondary | ICD-10-CM | POA: Diagnosis not present

## 2021-12-22 DIAGNOSIS — R52 Pain, unspecified: Secondary | ICD-10-CM | POA: Diagnosis not present

## 2021-12-22 DIAGNOSIS — N186 End stage renal disease: Secondary | ICD-10-CM | POA: Diagnosis not present

## 2021-12-22 DIAGNOSIS — L299 Pruritus, unspecified: Secondary | ICD-10-CM | POA: Diagnosis not present

## 2021-12-25 DIAGNOSIS — N186 End stage renal disease: Secondary | ICD-10-CM | POA: Diagnosis not present

## 2021-12-25 DIAGNOSIS — Z992 Dependence on renal dialysis: Secondary | ICD-10-CM | POA: Diagnosis not present

## 2021-12-25 DIAGNOSIS — N2581 Secondary hyperparathyroidism of renal origin: Secondary | ICD-10-CM | POA: Diagnosis not present

## 2021-12-25 DIAGNOSIS — L299 Pruritus, unspecified: Secondary | ICD-10-CM | POA: Diagnosis not present

## 2021-12-26 ENCOUNTER — Encounter: Payer: BC Managed Care – PPO | Attending: Obstetrics and Gynecology | Admitting: Physical Therapy

## 2021-12-26 ENCOUNTER — Encounter: Payer: Self-pay | Admitting: Physical Therapy

## 2021-12-26 ENCOUNTER — Other Ambulatory Visit: Payer: Self-pay

## 2021-12-26 DIAGNOSIS — R102 Pelvic and perineal pain: Secondary | ICD-10-CM | POA: Diagnosis present

## 2021-12-26 DIAGNOSIS — M6281 Muscle weakness (generalized): Secondary | ICD-10-CM | POA: Diagnosis present

## 2021-12-26 DIAGNOSIS — R252 Cramp and spasm: Secondary | ICD-10-CM | POA: Diagnosis present

## 2021-12-26 NOTE — Therapy (Signed)
[place] at [facility] [address], [place], [place], [address] Phone: [phone]   Fax:  [phone]  Patient Details  Name: Karla Garner MRN: [id number] Date of Birth: May 14, 1986 Referring Provider:  [name], *  Encounter Date: 12/26/2021   OUTPATIENT PHYSICAL THERAPY FEMALE PELVIC EVALUATION   Patient Name: Karla Garner MRN: [id number] DOB:[birth year], 36 y.o., female Today's Date: 12/26/2021   PT End of Session - 12/26/21 1408     Visit Number 1    Date for PT Re-Evaluation 03/20/22    Authorization Type BCBS    PT Start Time 1130    PT Stop Time 1225    PT Time Calculation (min) 55 min    Activity Tolerance Patient tolerated treatment well;No increased pain    Behavior During Therapy [facility] for tasks assessed/performed             Past Medical History:  Diagnosis Date   Anemia    Depression    Dysuria 11/04/2018   ESRD (end stage renal disease) ([place])    from IgA nephritis   Glomerulonephritis    Hemodialysis patient [facility])    Hypertension    Menorrhagia with regular cycle 11/04/2018   Normocytic anemia 08/02/2015   PONV (postoperative nausea and vomiting)    was on dialysis i during pregnancy2014   Swelling of right side of face 11/01/2017   Thrombocytopenia ([place])    07/2015 in setting of sepsis   Past Surgical History:  Procedure Laterality Date   A/V FISTULAGRAM N/A 01/08/2018   Procedure: A/V FISTULAGRAM - right arm;  Surgeon: [name], MD;  Location: [place] CV [facility];  Service: Cardiovascular;  Laterality: N/A;   AV FISTULA PLACEMENT Left    AV FISTULA PLACEMENT Right 04/14/2015   Procedure: CREATION OF RIGHT RADIOCEPHALIC ARTERIOVENOUS (AV) FISTULA ;  Surgeon: Angelia Mould, MD;  Location: [place];  Service: Vascular;  Laterality: Right;   AV FISTULA PLACEMENT Right 11/22/2016   Procedure: ARTERIOVENOUS (AV) FISTULA CREATION;  Surgeon: [name], MD;  Location: [place];  Service: Vascular;  Laterality: Right;   CESAREAN SECTION     2009   ESOPHAGOGASTRODUODENOSCOPY N/A 11/24/2016   Procedure: ESOPHAGOGASTRODUODENOSCOPY (EGD);  Surgeon: [name], MD;  Location: [facility] ENDOSCOPY;  Service: Endoscopy;  Laterality: N/A;   EXCHANGE OF A DIALYSIS CATHETER Right 04/14/2015   Procedure: EXCHANGE OF RIGHT INTERNAL JUGULAR DIALYSIS CATHETER;  Surgeon: Angelia Mould, MD;  Location: [place];  Service: Vascular;  Laterality: Right;   INSERTION OF DIALYSIS CATHETER Left 11/22/2016   Procedure: INSERTION OF DIALYSIS CATHETER - LEFT INTERNAL JUGULAR PLACEMENT;  Surgeon: [name], MD;  Location: [place];  Service: Vascular;  Laterality: Left;   IR AV DIALY SHUNT INTRO NEEDLE/INTRAC INITIAL W/PTA/STENT/IMG RT Right 02/15/2020   IR AV DIALY SHUNT INTRO NEEDLE/INTRACATH INITIAL W/PTA/IMG LEFT  10/18/2020   IR DIALY SHUNT INTRO NEEDLE/INTRACATH INITIAL W/IMG RIGHT Right 07/26/2017   IR DIALY SHUNT INTRO NEEDLE/INTRACATH INITIAL W/IMG RIGHT Right 10/18/2017   IR DIALY SHUNT INTRO NEEDLE/INTRACATH INITIAL W/IMG RIGHT Right 03/10/2018   IR DIALY SHUNT INTRO NEEDLE/INTRACATH INITIAL W/IMG RIGHT Right 12/30/2018   IR PTA ADDL CENTRAL DIALYSIS SEG THRU DIALY CIRCUIT LEFT Left 10/18/2020   IR RADIOLOGIST EVAL & MGMT  07/02/2019   IR REMOVAL TUN CV CATH W/O FL  03/06/2017   IR TRANSCATH PLC STENT 1ST ART NOT LE CV CAR VERT CAR  12/30/2018   IR US  GUIDE VASC ACCESS RIGHT  12/30/2018   IR US GUIDE VASC ACCESS RIGHT  12/30/2018   IR US GUIDE VASC ACCESS RIGHT  02/15/2020   IR VENO/EXT/UNI LEFT  12/30/2018   LIGATION OF ARTERIOVENOUS  FISTULA Left 11/05/2014   Procedure: LIGATION OF LEFT ARM  BRACHIO-CEPHALIC ARTERIOVENOUS  FISTULA ,& REPAIR OF BRACHIAL ARTERY.;  Surgeon: Mal [name], MD;  Location: [place];  Service: Vascular;  Laterality: Left;   PARATHYROIDECTOMY N/A 11/09/2021   Procedure: TOTAL PARATHYROIDECTOMY;  Surgeon: Armandina Gemma,  MD;  Location: [place];  Service: General;  Laterality: N/A;   PERIPHERAL VASCULAR BALLOON ANGIOPLASTY  01/08/2018   Procedure: PERIPHERAL VASCULAR BALLOON ANGIOPLASTY;  Surgeon: [name], MD;  Location: [place] CV [facility];  Service: Cardiovascular;;  innominate vein   THROMBECTOMY W/ EMBOLECTOMY Right 11/16/2016   Procedure: THROMBECTOMY REVISION OF ARTERIOVENOUS FISTULA - RIGHT ARM;  Surgeon: [name], MD;  Location: [place];  Service: Vascular;  Laterality: Right;   TUBAL LIGATION  2014   Patient Active Problem List   Diagnosis Date Noted   Secondary hyperparathyroidism of renal origin ([place]) 11/09/2021   Hyperparathyroidism, secondary ([place]) 11/05/2021   Cervical cancer screening 10/17/2021   Routine screening for STI (sexually transmitted infection) 10/17/2021   Uterine prolapse 10/17/2021   Dyspareunia in female 10/17/2021   Anxiety state 06/05/2018   Healthcare maintenance 11/21/2017   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   Depression 11/01/2017   Acid reflux 11/01/2017   ESRD on hemodialysis ([place])    Anemia of chronic kidney failure 04/09/2015   Hypertension 04/30/2013   Glomerulonephritis, IgA 04/30/2013    PCP: Ladell Pier, MD  REFERRING PROVIDER: [name], *  REFERRING DIAG: R10.2 Pelvic pain; [phone] Levator spasm  THERAPY DIAG:  Muscle weakness (generalized)  Cramp and spasm  Pelvic pain  ONSET DATE: 12/27/2019  SUBJECTIVE:                                                                                                                                                                                           SUBJECTIVE STATEMENT: 11 years ago after her last child was born and she had a bump in the vaginal area and took antibiotics. The bump is still there. She is bleeding vaginally daily since she has seen the doctor. I was prescribed a muscle relaxer and when take it she will bled vaginally. No urinary leakage. Interpreter was  present during the evaluation. Patient does not speak english.  Fluid intake: Yes: green tea, juice, 1.5 bottles of water (3/4 of 1 litter of water)    Patient confirms identification and approves PT  to assess pelvic floor and treatment No  PERTINENT HISTORY:  Patient has dialysis 3 times per week.  Sexual abuse: No  PAIN:  Are you having pain? Yes NPRS scale: 6-7/10 Pain location: Vaginal and low abdominal  Pain type: stabbing and burning Pain description: intermittent   Aggravating factors: walking too much, intercourse, vaginal exam, little pain with urination,  Relieving factors: sit down   BOWEL MOVEMENT Pain with bowel movement: Yes Type of bowel movement:Type (Bristol Stool Scale) Type 6 and Frequency 1-2 times per day Fully empty rectum: Yes: not all the time Leakage: No Pads: No Fiber supplement: No  URINATION Pain with urination: Yes Fully empty bladder: No Stream: Strong Urgency: No Frequency: 1 time per day, sometimes feel the sensation to urinate but does not urinate Leakage:  none Pads: No  INTERCOURSE Pain with intercourse: Initial Penetration and During Penetration Ability to have vaginal penetration:  Yes:    PREGNANCY Vaginal deliveries 1  C-section deliveries 2 Currently pregnant No  PROLAPSE None, has a feeling of something falling out  PRECAUTIONS: Other: Dialysis  WEIGHT BEARING RESTRICTIONS No  FALLS:  Has patient fallen in last 6 months? No, Number of falls: 1 when she was sweeping a wet floor and fell on her bottom. Not due to balance.   LIVING ENVIRONMENT: Lives with: lives with their family  OCCUPATION: NO working anymore.   PLOF: Independent  PATIENT GOALS reduce pain    OBJECTIVE:   DIAGNOSTIC FINDINGS:  None  COGNITION:  Overall cognitive status: Within functional limits for tasks assessed      POSTURE:  Good  PALPATION: Internal Pelvic Floor Not assessed today due to pain and still bleeding from last  internal exam 2 weeks ago.   External Perineal Exam tenderness located in the levator ani, bulbocavernosus, hip adductors, hamstring, gluteal, piriformis, lumbar.   GENERAL Abdomen is tender Bilateral hips have full ROM but pain at the end range  LUMBARAROM/PROM  A/PROM A/PROM  12/26/2021  Flexion   Extension Decreased by 25%  Right lateral flexion Decreased by 25%  Left lateral flexion   Right rotation   Left rotation    (Blank rows = not tested)  LE AROM/PROM:  A/PROM Right 12/26/2021 Left 12/26/2021  Hip flexion    Hip extension    Hip abduction    Hip adduction    Hip internal rotation    Hip external rotation    Knee flexion    Knee extension    Ankle dorsiflexion    Ankle plantarflexion    Ankle inversion    Ankle eversion     (Blank rows = not tested)  LE MMT:  MMT Right 12/26/2021 Left 12/26/2021  Hip flexion    Hip extension 4/5 4/5  Hip abduction 4/5 4/5  Hip adduction    Hip internal rotation    Hip external rotation    Knee flexion    Knee extension    Ankle dorsiflexion    Ankle plantarflexion    Ankle inversion    Ankle eversion     PELVIC MMT:   MMT  12/26/2021  Vaginal   Internal Anal Sphincter   External Anal Sphincter   Puborectalis   Diastasis Recti   (Blank rows = not tested)   TONE: incresaed      TODAY'S TREATMENT  EVAL completed today with an interpreter present.    PATIENT EDUCATION:  Education details: eduated patient on the pelvis and gave her a you tube link for meditation Person  educated: Patient and , interpreter Education method: Explanation Education comprehension: verbalized understanding   HOME EXERCISE PROGRAM: Gave patient information on pelvic floor meditation  ASSESSMENT:  CLINICAL IMPRESSION: Patient is a 36 y.o. female who was seen today for physical therapy evaluation and treatment for pelvic pain and levator ani spasm. Patient reports her pain started   11 years ago when she had her last child.  She had a bump in her vaginal and was given antibiotic then. Patient reports her pain level is 6-7/10 during intercourse, vaginal exam, bowel movements, walking and urination. Her intermittent pain is located in the lower abdomen and vaginally. She does not feel she is emptying her bladder and rectum fully. She presently has dialysis 3 times per week and sits for 4 hours. Patient has full hip ROM but pain at end range. Patient has weakness in bilateral hip abduction and extension. She has palpable tenderness located in the lumbar, gluteals, abdomen, levator ani, obturator internist, hip adductors, and hamstring. Decreased mobility of the thoracic and lumbar vertebrae. Patient did not have internal assessment of the pelvic floor due to the pain level she was at and bleeding vaginally. She was educated on the pelvis and muscles to understand how things are connected. Patient had a female interpreter present during the evaluation. Patient would benefit from skilled therapy to improve muscle elongation and reduction of trigger points to improve quality of life.    OBJECTIVE IMPAIRMENTS decreased activity tolerance, decreased coordination, decreased endurance, decreased strength, increased fascial restrictions, increased muscle spasms, and pain.   ACTIVITY LIMITATIONS cleaning, community activity, driving, meal prep, laundry, and shopping.   PERSONAL FACTORS ESRD (end stage renal disease , c-section 2 times, Hemodialysis are also affecting patient's functional outcome.    REHAB POTENTIAL: Excellent  CLINICAL DECISION MAKING: Evolving/moderate complexity  EVALUATION COMPLEXITY: Moderate   GOALS: Goals reviewed with patient? Yes  SHORT TERM GOALS:  Patient is independent with initial HEP for hip stretches and diaphragmatic breathing.  Target date: 01/23/2022 Goal status: INITIAL  2.  Patient educated on vaginal health and moisturizers to reduce burning with urination.  Target date: 01/23/2022 Goal  status: INITIAL  3.  Patient understands how to perform abdominal massage and perineal massage.   Target date: 01/23/2022 Goal status: INITIAL   LONG TERM GOALS:  Patient independent with advanced HEP to elongate pelvic floor muscles and hip strength Target date: 03/20/2022 Goal status: INITIAL  2.  Patient able to vaginal penetration for vaginal exam with pain level decreased </= 2-3/10 due to elongation of tissue.  Target date: 03/20/2022 Goal status: INITIAL  3.  Patient able to walk for 15 to 20 minutes with pain level </= 2-310 due to improve lengthen tissue and endurance.  Target date: 03/20/2022 Goal status: INITIAL  4.  Patient able to have penile penetration vaginally with pain >/= 1/10 due to improved tissue elongation and relaxation.  Target date: 03/20/2022 Goal status: INITIAL  5.  Patient able to perform her daily tasks with pain level <.= 2-3/10 due to increased endurance  Target date: 03/20/2022 Goal status: INITIAL   PLAN: PT FREQUENCY: 1-2x/week  PT DURATION: 12 weeks  PLANNED INTERVENTIONS: Therapeutic exercises, Therapeutic activity, Neuromuscular re-education, Patient/Family education, Joint mobilization, Dry Needling, Electrical stimulation, Spinal mobilization, Cryotherapy, Moist heat, Ultrasound, Biofeedback, and Manual therapy  PLAN FOR NEXT SESSION: diaphragmatic breathing, hip stretches, manual work to abdomen and back and hips   Atzel Mccambridge, PT 12/26/2021, 2:09 PM  Makiah Foye, PT 12/26/2021, 2:09 PM  [facility] [address], [place], [place], [address] Phone: [phone]   Fax:  [phone]

## 2021-12-27 DIAGNOSIS — N186 End stage renal disease: Secondary | ICD-10-CM | POA: Diagnosis not present

## 2021-12-27 DIAGNOSIS — L299 Pruritus, unspecified: Secondary | ICD-10-CM | POA: Diagnosis not present

## 2021-12-27 DIAGNOSIS — Z992 Dependence on renal dialysis: Secondary | ICD-10-CM | POA: Diagnosis not present

## 2021-12-27 DIAGNOSIS — N2581 Secondary hyperparathyroidism of renal origin: Secondary | ICD-10-CM | POA: Diagnosis not present

## 2021-12-29 DIAGNOSIS — L299 Pruritus, unspecified: Secondary | ICD-10-CM | POA: Diagnosis not present

## 2021-12-29 DIAGNOSIS — N186 End stage renal disease: Secondary | ICD-10-CM | POA: Diagnosis not present

## 2021-12-29 DIAGNOSIS — N2581 Secondary hyperparathyroidism of renal origin: Secondary | ICD-10-CM | POA: Diagnosis not present

## 2021-12-29 DIAGNOSIS — Z992 Dependence on renal dialysis: Secondary | ICD-10-CM | POA: Diagnosis not present

## 2022-01-01 DIAGNOSIS — R52 Pain, unspecified: Secondary | ICD-10-CM | POA: Diagnosis not present

## 2022-01-01 DIAGNOSIS — N186 End stage renal disease: Secondary | ICD-10-CM | POA: Diagnosis not present

## 2022-01-01 DIAGNOSIS — Z992 Dependence on renal dialysis: Secondary | ICD-10-CM | POA: Diagnosis not present

## 2022-01-01 DIAGNOSIS — L299 Pruritus, unspecified: Secondary | ICD-10-CM | POA: Diagnosis not present

## 2022-01-01 DIAGNOSIS — N2581 Secondary hyperparathyroidism of renal origin: Secondary | ICD-10-CM | POA: Diagnosis not present

## 2022-01-02 DIAGNOSIS — I871 Compression of vein: Secondary | ICD-10-CM | POA: Diagnosis not present

## 2022-01-02 DIAGNOSIS — N186 End stage renal disease: Secondary | ICD-10-CM | POA: Diagnosis not present

## 2022-01-02 DIAGNOSIS — Z992 Dependence on renal dialysis: Secondary | ICD-10-CM | POA: Diagnosis not present

## 2022-01-03 DIAGNOSIS — N2581 Secondary hyperparathyroidism of renal origin: Secondary | ICD-10-CM | POA: Diagnosis not present

## 2022-01-03 DIAGNOSIS — L299 Pruritus, unspecified: Secondary | ICD-10-CM | POA: Diagnosis not present

## 2022-01-03 DIAGNOSIS — Z992 Dependence on renal dialysis: Secondary | ICD-10-CM | POA: Diagnosis not present

## 2022-01-03 DIAGNOSIS — R52 Pain, unspecified: Secondary | ICD-10-CM | POA: Diagnosis not present

## 2022-01-03 DIAGNOSIS — N186 End stage renal disease: Secondary | ICD-10-CM | POA: Diagnosis not present

## 2022-01-05 DIAGNOSIS — R52 Pain, unspecified: Secondary | ICD-10-CM | POA: Diagnosis not present

## 2022-01-05 DIAGNOSIS — L299 Pruritus, unspecified: Secondary | ICD-10-CM | POA: Diagnosis not present

## 2022-01-05 DIAGNOSIS — Z992 Dependence on renal dialysis: Secondary | ICD-10-CM | POA: Diagnosis not present

## 2022-01-05 DIAGNOSIS — N2581 Secondary hyperparathyroidism of renal origin: Secondary | ICD-10-CM | POA: Diagnosis not present

## 2022-01-05 DIAGNOSIS — N186 End stage renal disease: Secondary | ICD-10-CM | POA: Diagnosis not present

## 2022-01-08 DIAGNOSIS — L299 Pruritus, unspecified: Secondary | ICD-10-CM | POA: Diagnosis not present

## 2022-01-08 DIAGNOSIS — Z992 Dependence on renal dialysis: Secondary | ICD-10-CM | POA: Diagnosis not present

## 2022-01-08 DIAGNOSIS — N186 End stage renal disease: Secondary | ICD-10-CM | POA: Diagnosis not present

## 2022-01-08 DIAGNOSIS — N2581 Secondary hyperparathyroidism of renal origin: Secondary | ICD-10-CM | POA: Diagnosis not present

## 2022-01-09 ENCOUNTER — Other Ambulatory Visit: Payer: Self-pay

## 2022-01-09 ENCOUNTER — Ambulatory Visit (INDEPENDENT_AMBULATORY_CARE_PROVIDER_SITE_OTHER): Payer: BC Managed Care – PPO | Admitting: Obstetrics and Gynecology

## 2022-01-09 ENCOUNTER — Encounter: Payer: Self-pay | Admitting: Obstetrics and Gynecology

## 2022-01-09 VITALS — BP 116/77 | HR 81

## 2022-01-09 DIAGNOSIS — M62838 Other muscle spasm: Secondary | ICD-10-CM | POA: Diagnosis not present

## 2022-01-09 DIAGNOSIS — N939 Abnormal uterine and vaginal bleeding, unspecified: Secondary | ICD-10-CM | POA: Diagnosis not present

## 2022-01-09 DIAGNOSIS — R102 Pelvic and perineal pain: Secondary | ICD-10-CM

## 2022-01-09 MED ORDER — TRANEXAMIC ACID 650 MG PO TABS
1300.0000 mg | ORAL_TABLET | Freq: Three times a day (TID) | ORAL | 0 refills | Status: AC
Start: 2022-01-09 — End: 2022-01-14

## 2022-01-09 NOTE — Progress Notes (Signed)
[facility] ?Return Visit ? ?SUBJECTIVE  ?History of Present Illness: ?Karla Garner is a 36 y.o. female seen in follow-up for pelvic pain and pelvic floor muscle spasm. Plan at last visit was to start flexeril 5mg  daily for pelvic pain. .  ? ?She feels that the muscle pain has improved with the flexeril. Only hurts when she walks a lot. Taking the night. Has not attempted intercourse. Has had one appointment with the physical therapist.  ? ?Also, reports she has had vaginal bleeding for about a month. She is filling up 3 pads per day- soaked with clots.  Last had depo provera about 6 months ago.  ? ?Past Medical History: ?Patient  has a past medical history of Anemia, Depression, Dysuria (11/04/2018), ESRD (end stage renal disease) ([place]), Glomerulonephritis, Hemodialysis patient ([place]), Hypertension, Menorrhagia with regular cycle (11/04/2018), Normocytic anemia (08/02/2015), PONV (postoperative nausea and vomiting), Swelling of right side of face (11/01/2017), and Thrombocytopenia ([place]).  ? ?Past Surgical History: ?She  has a past surgical history that includes Cesarean section; Tubal ligation (2014); AV fistula placement (Left); Ligation of arteriovenous  fistula (Left, 11/05/2014); AV fistula placement (Right, 04/14/2015); Exchange of a dialysis catheter (Right, 04/14/2015); Thrombectomy w/ embolectomy (Right, 11/16/2016); AV fistula placement (Right, 11/22/2016); Insertion of dialysis catheter (Left, 11/22/2016); Esophagogastroduodenoscopy (N/A, 11/24/2016); IR Removal Tun Cv Cath W/O FL (03/06/2017); IR DIALY SHUNT INTRO NEEDLE/INTRACATH INITIAL W/IMG RIGHT (Right, 07/26/2017); IR DIALY SHUNT INTRO NEEDLE/INTRACATH INITIAL W/IMG RIGHT (Right, 10/18/2017); A/V Fistulagram (N/A, 01/08/2018); PERIPHERAL VASCULAR BALLOON ANGIOPLASTY (01/08/2018); IR DIALY SHUNT INTRO NEEDLE/INTRACATH INITIAL W/IMG RIGHT (Right, 03/10/2018); IR DIALY SHUNT INTRO NEEDLE/INTRACATH INITIAL W/IMG RIGHT (Right, 12/30/2018); IR US  Guide Vasc Access Right (12/30/2018); IR TRANSCATH PLC STENT 1ST ART NOT LE CV CAR VERT CAR (12/30/2018); IR US Guide Vasc Access Right (12/30/2018); IR Veno/Ext/Uni Left (12/30/2018); IR Radiologist Eval & Mgmt (07/02/2019); IR US Guide Vasc Access Right (02/15/2020); IR AV DIALY SHUNT INTRO NEEDLE/INTRAC INITIAL W/PTA/STENT/IMG RIGHT (Right, 02/15/2020); IR PTA ADDL CENTRAL DIALYSIS SEG THRU DIALY CIRCUIT LEFT (Left, 10/18/2020); IR AV DIALY SHUNT INTRO NEEDLE/INTRACATH INITIAL W/PTA/IMG LEFT (10/18/2020); and Parathyroidectomy (N/A, 11/09/2021).  ? ?Medications: ?She has a current medication list which includes the following prescription(s): tranexamic acid, amlodipine, amlodipine-atorvastatin, b-complex/b-12, dialyvite 800 with zinc, calcitriol, calcium acetate (phos binder), cyclobenzaprine, diphenhydramine, ferric citrate, ferrous sulfate, folic acid-vitamin b complex-vitamin c-selenium-zinc, levothyroxine, lidocaine, megestrol, multivitamin with minerals, and oxycodone.  ? ?Allergies: ?Patient has No Known Allergies.  ? ?Social History: ?Patient  reports that she has never smoked. She has never used smokeless tobacco. She reports that she does not drink alcohol and does not use drugs.  ?  ?  ?OBJECTIVE  ?  ? ?Physical Exam: ?Vitals:  ? 01/09/22 1345  ?BP: 116/77  ?Pulse: 81  ? ?Gen: No apparent distress, A&O x 3. ? ?Detailed Urogynecologic Evaluation:  ?Deferred.   ? ?ASSESSMENT AND PLAN  ?  ?[name] is a 36 y.o. with:  ?1. Levator spasm   ?2. Pelvic pain   ?3. Abnormal uterine bleeding   ? ?Levator spasm ?- Can continue the flexeril 5mg , up to three times per day as needed.  ?- Continue with pelvic PT ? ?2. AUB ?- We discussed that I do not practice general gynecology but will refer to the generalist group in the same building so they can help her manage her abnormal periods.  ?- Will order pelvis US to be done prior to her visit with them.  ?- Also prescribed lysteda 1300mg  to  take if she has  particularly heavy bleeding.  ? ?Return 5 months or sooner if needed ? ?[name], MD ? ?

## 2022-01-10 ENCOUNTER — Telehealth: Payer: Self-pay | Admitting: Internal Medicine

## 2022-01-10 DIAGNOSIS — N186 End stage renal disease: Secondary | ICD-10-CM | POA: Diagnosis not present

## 2022-01-10 DIAGNOSIS — N2581 Secondary hyperparathyroidism of renal origin: Secondary | ICD-10-CM | POA: Diagnosis not present

## 2022-01-10 DIAGNOSIS — L299 Pruritus, unspecified: Secondary | ICD-10-CM | POA: Diagnosis not present

## 2022-01-10 DIAGNOSIS — Z992 Dependence on renal dialysis: Secondary | ICD-10-CM | POA: Diagnosis not present

## 2022-01-10 NOTE — Telephone Encounter (Signed)
-----   Message from Ena Dawley sent at 01/04/2022  4:54 PM EDT ----- ?Regarding: Endocrinology Appt ?Patient schedule 02/01/2022 @ 8:10am  with Velora Heckler Endocrinologist .  ?[facility] [place] [facility] only have  1 provider that accept  new patients and appts are book until the end of July  .   ?----- Message ----- ?From: Ladell Pier, MD ?Sent: 12/12/2021  11:11 PM EDT ?To: Ena Dawley ? ?Please get her in with the surgeon Dr. Harlow Asa for follow-up post parathyroidectomy ASAP.  Please get her in with endocrinology as soon as possible. ? ? ?

## 2022-01-11 ENCOUNTER — Emergency Department (HOSPITAL_COMMUNITY)
Admission: EM | Admit: 2022-01-11 | Discharge: 2022-01-11 | Disposition: A | Discharge status: Home / Self Care | Payer: BC Managed Care – PPO | Attending: Emergency Medicine | Admitting: Emergency Medicine

## 2022-01-11 ENCOUNTER — Encounter: Payer: BC Managed Care – PPO | Admitting: Physical Therapy

## 2022-01-11 ENCOUNTER — Encounter (HOSPITAL_COMMUNITY): Payer: Self-pay | Admitting: Emergency Medicine

## 2022-01-11 ENCOUNTER — Telehealth: Payer: Self-pay | Admitting: Physical Therapy

## 2022-01-11 ENCOUNTER — Other Ambulatory Visit: Payer: Self-pay

## 2022-01-11 DIAGNOSIS — R202 Paresthesia of skin: Secondary | ICD-10-CM | POA: Diagnosis not present

## 2022-01-11 DIAGNOSIS — R1110 Vomiting, unspecified: Secondary | ICD-10-CM | POA: Diagnosis not present

## 2022-01-11 DIAGNOSIS — Z79899 Other long term (current) drug therapy: Secondary | ICD-10-CM | POA: Insufficient documentation

## 2022-01-11 DIAGNOSIS — Z9089 Acquired absence of other organs: Secondary | ICD-10-CM | POA: Insufficient documentation

## 2022-01-11 DIAGNOSIS — N939 Abnormal uterine and vaginal bleeding, unspecified: Secondary | ICD-10-CM | POA: Diagnosis not present

## 2022-01-11 DIAGNOSIS — Z9889 Other specified postprocedural states: Secondary | ICD-10-CM | POA: Insufficient documentation

## 2022-01-11 LAB — BASIC METABOLIC PANEL
Anion gap: 15 (ref 5–15)
BUN: 47 mg/dL — ABNORMAL HIGH (ref 6–20)
CO2: 28 mmol/L (ref 22–32)
Calcium: 7.1 mg/dL — ABNORMAL LOW (ref 8.9–10.3)
Chloride: 94 mmol/L — ABNORMAL LOW (ref 98–111)
Creatinine, Ser: 8.36 mg/dL — ABNORMAL HIGH (ref 0.44–1.00)
GFR, Estimated: 6 mL/min — ABNORMAL LOW (ref >60)
Glucose, Bld: 101 mg/dL — ABNORMAL HIGH (ref 70–99)
Potassium: 4.6 mmol/L (ref 3.5–5.1)
Sodium: 137 mmol/L (ref 135–145)

## 2022-01-11 LAB — CBC WITH DIFFERENTIAL/PLATELET
Abs Immature Granulocytes: 0.03 10*3/uL (ref 0.00–0.07)
Basophils Absolute: 0.1 10*3/uL (ref 0.0–0.1)
Basophils Relative: 1 %
Eosinophils Absolute: 0.1 10*3/uL (ref 0.0–0.5)
Eosinophils Relative: 1 %
HCT: 34.6 % — ABNORMAL LOW (ref 36.0–46.0)
Hemoglobin: 11.4 g/dL — ABNORMAL LOW (ref 12.0–15.0)
Immature Granulocytes: 0 %
Lymphocytes Relative: 18 %
Lymphs Abs: 1.7 10*3/uL (ref 0.7–4.0)
MCH: 30.2 pg (ref 26.0–34.0)
MCHC: 32.9 g/dL (ref 30.0–36.0)
MCV: 91.5 fL (ref 80.0–100.0)
Monocytes Absolute: 0.9 10*3/uL (ref 0.1–1.0)
Monocytes Relative: 9 %
Neutro Abs: 6.7 10*3/uL (ref 1.7–7.7)
Neutrophils Relative %: 71 %
Platelets: 251 10*3/uL (ref 150–400)
RBC: 3.78 MIL/uL — ABNORMAL LOW (ref 3.87–5.11)
RDW: 15.9 % — ABNORMAL HIGH (ref 11.5–15.5)
WBC: 9.5 10*3/uL (ref 4.0–10.5)
nRBC: 0 % (ref 0.0–0.2)

## 2022-01-11 LAB — I-STAT CHEM 8, ED
BUN: 46 mg/dL — ABNORMAL HIGH (ref 6–20)
Calcium, Ion: 0.76 mmol/L — CL (ref 1.15–1.40)
Chloride: 96 mmol/L — ABNORMAL LOW (ref 98–111)
Creatinine, Ser: 9.3 mg/dL — ABNORMAL HIGH (ref 0.44–1.00)
Glucose, Bld: 83 mg/dL (ref 70–99)
HCT: 38 % (ref 36.0–46.0)
Hemoglobin: 12.9 g/dL (ref 12.0–15.0)
Potassium: 4.5 mmol/L (ref 3.5–5.1)
Sodium: 137 mmol/L (ref 135–145)
TCO2: 29 mmol/L (ref 22–32)

## 2022-01-11 LAB — I-STAT BETA HCG BLOOD, ED (MC, WL, AP ONLY): I-stat hCG, quantitative: <5 m[IU]/mL (ref <5)

## 2022-01-11 MED ORDER — CALCIUM GLUCONATE-NACL 1-0.675 GM/50ML-% IV SOLN
1.0000 g | Freq: Once | INTRAVENOUS | Status: DC
Start: 2022-01-11 — End: 2022-01-12
  Filled 2022-01-11: qty 50

## 2022-01-11 NOTE — ED Provider Notes (Signed)
?[facility] ?Provider Note ? ? ?CSN: [id number] ?Arrival date & time: 01/11/22  1551 ? ?  ? ?History ? ?Chief Complaint  ?Patient presents with  ? Vaginal Bleeding  ? Emesis  ? ? ?Karla Garner is a 36 y.o. female. ? ?36 yo F with a chief complaint of a sensation like she is having cramps tingling.  Been going on for a couple months now.  She had had some significant vaginal bleeding that she get started on TXA for and that is stopped but she had a couple episodes of vomiting today and felt like the tingling maybe had gotten worse since that come here for evaluation.  She says at the onset of the tingling sensation she had had a parathyroidectomy. ? ? ?Vaginal Bleeding ?Emesis ? ?  ? ?Home Medications ?Prior to Admission medications   ?Medication Sig Start Date End Date Taking? Authorizing Provider  ?amLODipine (NORVASC) 10 MG tablet Take 10 mg by mouth at bedtime. ?Patient not taking: Reported on 12/26/2021 09/02/21   [provider]  ?amlodipine-atorvastatin (CADUET) 10-10 MG tablet Take 1 tablet by mouth daily.    [provider]  ?B Complex Vitamins (B-COMPLEX/B-12) TABS Take 1 tablet by mouth daily.    [provider]  ?B Complex-C-Zn-Folic Acid (DIALYVITE 409 WITH ZINC) 0.8 MG TABS Take 1 tablet by mouth daily. ?Patient not taking: Reported on 12/26/2021 03/21/20   [provider]  ?calcitRIOL (ROCALTROL) 0.5 MCG capsule Take 0.5 mcg by mouth daily.    [provider]  ?calcium acetate, Phos Binder, (PHOSLYRA) 667 MG/5ML SOLN Take by mouth 3 (three) times daily with meals.    [provider]  ?cyclobenzaprine (FLEXERIL) 5 MG tablet Take 1 tablet (5 mg total) by mouth 3 (three) times daily as needed for muscle spasms. 12/12/21   [name], MD  ?diphenhydrAMINE (BENADRYL) 25 MG tablet Take 25 mg by mouth every 6 (six) hours as needed for itching. ?Patient not taking: Reported on 12/26/2021    [provider]  ?ferric citrate (AURYXIA) 1 GM 210 MG(Fe) tablet Take 420-630 mg by mouth See admin instructions. Take 630 mg with each meal and large snack, take 420 mg with each small snack ?Patient not taking: Reported on 12/26/2021    [provider]  ?ferrous sulfate 325 (65 FE) MG tablet Take 325 mg by mouth daily with breakfast. ?Patient not taking: Reported on 12/26/2021    [provider]  ?folic acid-vitamin b complex-vitamin c-selenium-zinc (DIALYVITE) 3 MG TABS tablet Take 1 tablet by mouth daily.    [provider]  ?levothyroxine (SYNTHROID) 50 MCG tablet Take 50 mcg by mouth daily. 09/02/21   [provider]  ?lidocaine (XYLOCAINE) 2 % jelly Apply 1 application topically as needed (place vaginally). ?Patient not taking: Reported on 12/26/2021 12/12/21   [name], MD  ?megestrol (MEGACE) 40 MG tablet Take 1 tablet (40 mg total) by mouth 2 (two) times daily. ?Patient not taking: Reported on 06/01/9146 06/20/55   [name], MD  ?Multiple Vitamin (MULTIVITAMIN WITH MINERALS) TABS tablet Take 1 tablet by mouth daily.    [provider]  ?oxyCODONE (OXY IR/ROXICODONE) 5 MG immediate release tablet Take 1 tablet (5 mg total) by mouth every 4 (four) hours as needed for moderate pain. ?Patient not taking: Reported on 12/26/2021 11/12/21   Rolm Bookbinder, MD  ?tranexamic acid (LYSTEDA) 650 MG TABS tablet Take 2 tablets (1,300 mg total) by mouth 3 (three)  times daily for 5 days. 01/09/22 01/14/22  [name], MD  ?   ? ?Allergies    ?Patient has no known allergies.   ? ?Review of Systems   ?Review of Systems  ?Gastrointestinal:  Positive for vomiting.  ?Genitourinary:  Positive for vaginal bleeding.  ? ?Physical Exam ?Updated Vital Signs ?BP 120/73   Pulse 79   Temp 98.9 ?F (37.2 ?C) (Oral)   Resp 18   Ht 4\' 9"  (1.448 m)   Wt 43.5 kg   LMP 01/11/2022   SpO2 100%   BMI 20.77 kg/m?  ?Physical Exam ?Vitals and nursing note reviewed.   ?Constitutional:   ?   General: She is not in acute distress. ?   Appearance: She is well-developed. She is not diaphoretic.  ?HENT:  ?   Head: Normocephalic and atraumatic.  ?Eyes:  ?   Pupils: Pupils are equal, round, and reactive to light.  ?Cardiovascular:  ?   Rate and Rhythm: Normal rate and regular rhythm.  ?   Heart sounds: No murmur heard. ?  No friction rub. No gallop.  ?Pulmonary:  ?   Effort: Pulmonary effort is normal.  ?   Breath sounds: No wheezing or rales.  ?Abdominal:  ?   General: There is no distension.  ?   Palpations: Abdomen is soft.  ?   Tenderness: There is no abdominal tenderness.  ?Musculoskeletal:     ?   General: No tenderness.  ?   Cervical back: Normal range of motion and neck supple.  ?Skin: ?   General: Skin is warm and dry.  ?Neurological:  ?   Mental Status: She is alert and oriented to person, place, and time.  ?Psychiatric:     ?   Behavior: Behavior normal.  ? ? ?ED Results / Procedures / Treatments   ?Labs ?(all labs ordered are listed, but only abnormal results are displayed) ?Labs Reviewed  ?CBC WITH DIFFERENTIAL/PLATELET - Abnormal; Notable for the following components:  ?    Result Value  ? RBC 3.78 (*)   ? Hemoglobin 11.4 (*)   ? HCT 34.6 (*)   ? RDW 15.9 (*)   ? All other components within normal limits  ?BASIC METABOLIC PANEL - Abnormal; Notable for the following components:  ? Chloride 94 (*)   ? Glucose, Bld 101 (*)   ? BUN 47 (*)   ? Creatinine, Ser 8.36 (*)   ? Calcium 7.1 (*)   ? GFR, Estimated 6 (*)   ? All other components within normal limits  ?PARATHYROID HORMONE, INTACT (NO CA)  ?I-STAT BETA HCG BLOOD, ED ([facility], [facility], [facility] ONLY)  ?I-STAT BETA HCG BLOOD, ED ([facility], [facility], [facility] ONLY)  ?I-STAT CHEM 8, ED  ? ? ?EKG ?None ? ?Radiology ?No results found. ? ?Procedures ?Procedures  ? ? ?Medications Ordered in ED ?Medications  ?calcium gluconate 1 g/ 50 mL sodium chloride IVPB (1,000 mg Intravenous Not Given 01/11/22 2052)  ? ? ?ED Course/ Medical Decision Making/ A&P ?  ?                         ?Medical Decision Making ?Risk ?Prescription drug management. ? ? ?36 yo F with a chief complaint of tingling all over and a couple episodes of emesis today.  The patient had a parathyroidectomy on my review of her medical record and since then she has had some low calcium levels.  Her calcium level today is 7.1.  We will send off parathyroid and ionized calcium test.  I discussed this with Dr. Moshe Cipro, nephrology she recommended giving a gram IV and starting her on multiple doses of Tums a day and she will send a message to her dialysis center to make sure she is getting the appropriate dosage of vitamin D. ? ?Unfortunate the patient is a difficult IV stick and nurses having trouble getting an IV started for calcium supplementation.  Patient would prefer just to take this orally.  We will have her follow-up with her dialysis unit. ? ?8:54 PM:  I have discussed the diagnosis/risks/treatment options with the patient.  Evaluation and diagnostic testing in the emergency department does not suggest an emergent condition requiring admission or immediate intervention beyond what has been performed at this time.  They will follow up with  PCP. We also discussed returning to the ED immediately if new or worsening sx occur. We discussed the sx which are most concerning (e.g., sudden worsening pain, fever, inability to tolerate by mouth) that necessitate immediate return. Medications administered to the patient during their visit and any new prescriptions provided to the patient are listed below. ? ?Medications given during this visit ?Medications  ?calcium gluconate 1 g/ 50 mL sodium chloride IVPB (1,000 mg Intravenous Not Given 01/11/22 2052)  ? ? ? ?The patient appears reasonably screen and/or stabilized for discharge and I doubt any other medical condition or other [facility] requiring further screening, evaluation, or treatment in the ED at this time prior to discharge.  ? ? ? ? ? ? ? ? ?Final Clinical  Impression(s) / ED Diagnoses ?Final diagnoses:  ?Hypocalcemia  ? ? ?Rx / DC Orders ?ED Discharge Orders   ? ? None  ? ?  ? ? ?  ?Deno Etienne, DO ?01/11/22 2054 ? ?

## 2022-01-11 NOTE — ED Notes (Signed)
Reviewed DC teaching using tele interpreter. Pt verbalized understanding of d/c instructions, meds, and followup care. Denies questions. VSS, no distress noted. Steady gait to exit with all belongings.  ?

## 2022-01-11 NOTE — Telephone Encounter (Signed)
Interpreter called patient while therapist was there. She talked to the patient on the phone. Patient thought her appointment was at 3:30. Interpreter informed patient her appt. Was  at 9:30. Reminded her about her next appointment. Instructed patient on our no show policy.  ?Earlie Counts, PT ?@3 /23/2023@ 9:48 AM ? ?

## 2022-01-11 NOTE — ED Triage Notes (Signed)
Using medical interpreter- patient repors vaginal bleeding x1.5 months and seen by dr Monday and given tranex acid. Reports emesis onset yesterday am, tingling sensation to full body and feeling cold all the time. Pt is dialysis pt and last had treatment yesterday.  ?

## 2022-01-11 NOTE — Discharge Instructions (Addendum)
6 tums a day. Follow up with your PCP and nephrologist.   ?

## 2022-01-11 NOTE — ED Provider Triage Note (Signed)
Emergency Medicine Provider Triage Evaluation Note ? ?Karla Garner , a 36 y.o. female  was evaluated in triage.  Pt complains of feeling cold, lightheaded, tingling sensation.  She states that she was having heavy vaginal bleeding for 1.5 months.  She was given TXA which stopped the bleeding however now she feels generally weak.  She is concerned that her hemoglobin level is low as she has had issues with similar vaginal bleeding and requiring blood transfusion in the past.  She is a dialysis patient has not missed any sessions. ? ?Review of Systems  ?Positive: + vag bleed, weakness, fatigue, tingling ?Negative: - SOB, chest pain, pelvic pain ? ?Physical Exam  ?BP 131/83 (BP Location: Left Arm)   Pulse 79   Temp 98.9 ?F (37.2 ?C) (Oral)   Resp 14   Ht 4\' 9"  (1.448 m)   Wt 43.5 kg   LMP 01/11/2022   SpO2 100%   BMI 20.77 kg/m?  ?Gen:   Awake, no distress   ?Resp:  Normal effort  ?MSK:   Moves extremities without difficulty  ?Other:   ? ?Medical Decision Making  ?Medically screening exam initiated at 5:08 PM.  Appropriate orders placed.  Karla Garner was informed that the remainder of the evaluation will be completed by another provider, this initial triage assessment does not replace that evaluation, and the importance of remaining in the ED until their evaluation is complete. ? ? ?  ?Eustaquio Maize, PA-C ?01/11/22 1710 ? ?

## 2022-01-11 NOTE — ED Notes (Signed)
IV attempted x2

## 2022-01-12 DIAGNOSIS — Z992 Dependence on renal dialysis: Secondary | ICD-10-CM | POA: Diagnosis not present

## 2022-01-12 DIAGNOSIS — N186 End stage renal disease: Secondary | ICD-10-CM | POA: Diagnosis not present

## 2022-01-12 DIAGNOSIS — L299 Pruritus, unspecified: Secondary | ICD-10-CM | POA: Diagnosis not present

## 2022-01-12 DIAGNOSIS — N2581 Secondary hyperparathyroidism of renal origin: Secondary | ICD-10-CM | POA: Diagnosis not present

## 2022-01-13 LAB — PARATHYROID HORMONE, INTACT (NO CA): PTH: 57 pg/mL (ref 15–65)

## 2022-01-15 DIAGNOSIS — N186 End stage renal disease: Secondary | ICD-10-CM | POA: Diagnosis not present

## 2022-01-15 DIAGNOSIS — L299 Pruritus, unspecified: Secondary | ICD-10-CM | POA: Diagnosis not present

## 2022-01-15 DIAGNOSIS — N2581 Secondary hyperparathyroidism of renal origin: Secondary | ICD-10-CM | POA: Diagnosis not present

## 2022-01-15 DIAGNOSIS — Z992 Dependence on renal dialysis: Secondary | ICD-10-CM | POA: Diagnosis not present

## 2022-01-17 DIAGNOSIS — Z992 Dependence on renal dialysis: Secondary | ICD-10-CM | POA: Diagnosis not present

## 2022-01-17 DIAGNOSIS — N186 End stage renal disease: Secondary | ICD-10-CM | POA: Diagnosis not present

## 2022-01-17 DIAGNOSIS — N2581 Secondary hyperparathyroidism of renal origin: Secondary | ICD-10-CM | POA: Diagnosis not present

## 2022-01-17 DIAGNOSIS — L299 Pruritus, unspecified: Secondary | ICD-10-CM | POA: Diagnosis not present

## 2022-01-18 ENCOUNTER — Encounter: Payer: Self-pay | Admitting: Physical Therapy

## 2022-01-18 ENCOUNTER — Encounter: Payer: BC Managed Care – PPO | Admitting: Physical Therapy

## 2022-01-18 DIAGNOSIS — R252 Cramp and spasm: Secondary | ICD-10-CM

## 2022-01-18 DIAGNOSIS — M6281 Muscle weakness (generalized): Secondary | ICD-10-CM

## 2022-01-18 DIAGNOSIS — R102 Pelvic and perineal pain unspecified side: Secondary | ICD-10-CM

## 2022-01-18 NOTE — Therapy (Addendum)
[facility] at [facility] [address] [place], [place], [address] Phone: [phone]   Fax:  [phone]  Patient Details  Name: Karla Garner MRN: [id number] Date of Birth: [birth year] Referring Provider:  [name], *  Encounter Date: 01/18/2022  OUTPATIENT PHYSICAL THERAPY TREATMENT NOTE   Patient Name: Karla Garner MRN: [id number] DOB:[birth year], 36 y.o., female Today's Date: 01/18/2022  PCP: Ladell Pier, MD REFERRING PROVIDER: [name], *   PT End of Session - 01/18/22 1259     Visit Number 2    Date for PT Re-Evaluation 03/20/22    Authorization Type BCBS    PT Start Time 0930    PT Stop Time 1025    PT Time Calculation (min) 55 min    Activity Tolerance Patient tolerated treatment well;No increased pain    Behavior During Therapy [facility] for tasks assessed/performed             Past Medical History:  Diagnosis Date   Anemia    Depression    Dysuria 11/04/2018   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   Glomerulonephritis    Hemodialysis patient [facility])    Hypertension    Menorrhagia with regular cycle 11/04/2018   Normocytic anemia 08/02/2015   PONV (postoperative nausea and vomiting)    was on dialysis i during pregnancy2014   Swelling of right side of face 11/01/2017   Thrombocytopenia ([place])    07/2015 in setting of sepsis   Past Surgical History:  Procedure Laterality Date   A/V FISTULAGRAM N/A 01/08/2018   Procedure: A/V FISTULAGRAM - right arm;  Surgeon: [name], MD;  Location: [facility] CV [facility];  Service: Cardiovascular;  Laterality: N/A;   AV FISTULA PLACEMENT Left    AV FISTULA PLACEMENT Right 04/14/2015   Procedure: CREATION OF RIGHT RADIOCEPHALIC ARTERIOVENOUS (AV) FISTULA ;  Surgeon: Angelia Mould, MD;  Location: [facility];  Service: Vascular;  Laterality: Right;   AV FISTULA PLACEMENT Right 11/22/2016    Procedure: ARTERIOVENOUS (AV) FISTULA CREATION;  Surgeon: [name], MD;  Location: [facility];  Service: Vascular;  Laterality: Right;   CESAREAN SECTION     2009   ESOPHAGOGASTRODUODENOSCOPY N/A 11/24/2016   Procedure: ESOPHAGOGASTRODUODENOSCOPY (EGD);  Surgeon: [name], MD;  Location: [facility] ENDOSCOPY;  Service: Endoscopy;  Laterality: N/A;   EXCHANGE OF A DIALYSIS CATHETER Right 04/14/2015   Procedure: EXCHANGE OF RIGHT INTERNAL JUGULAR DIALYSIS CATHETER;  Surgeon: Angelia Mould, MD;  Location: [facility];  Service: Vascular;  Laterality: Right;   INSERTION OF DIALYSIS CATHETER Left 11/22/2016   Procedure: INSERTION OF DIALYSIS CATHETER - LEFT INTERNAL JUGULAR PLACEMENT;  Surgeon: [name], MD;  Location: [facility];  Service: Vascular;  Laterality: Left;   IR AV DIALY SHUNT INTRO NEEDLE/INTRAC INITIAL W/PTA/STENT/IMG RT Right 02/15/2020   IR AV DIALY SHUNT INTRO NEEDLE/INTRACATH INITIAL W/PTA/IMG LEFT  10/18/2020   IR DIALY SHUNT INTRO NEEDLE/INTRACATH INITIAL W/IMG RIGHT Right 07/26/2017   IR DIALY SHUNT INTRO NEEDLE/INTRACATH INITIAL W/IMG RIGHT Right 10/18/2017   IR DIALY SHUNT INTRO NEEDLE/INTRACATH INITIAL W/IMG RIGHT Right 03/10/2018   IR DIALY SHUNT INTRO NEEDLE/INTRACATH INITIAL W/IMG RIGHT Right 12/30/2018   IR PTA ADDL CENTRAL DIALYSIS SEG THRU DIALY CIRCUIT LEFT Left 10/18/2020   IR RADIOLOGIST EVAL & MGMT  07/02/2019   IR REMOVAL TUN CV CATH W/O FL  03/06/2017   IR TRANSCATH PLC STENT 1ST ART NOT LE  CV CAR VERT CAR  12/30/2018   IR US GUIDE VASC ACCESS RIGHT  12/30/2018   IR US GUIDE VASC ACCESS RIGHT  12/30/2018   IR US GUIDE VASC ACCESS RIGHT  02/15/2020   IR VENO/EXT/UNI LEFT  12/30/2018   LIGATION OF ARTERIOVENOUS  FISTULA Left 11/05/2014   Procedure: LIGATION OF LEFT ARM  BRACHIO-CEPHALIC ARTERIOVENOUS  FISTULA ,& REPAIR OF BRACHIAL ARTERY.;  Surgeon: Mal [name], MD;  Location: [facility];  Service: Vascular;  Laterality: Left;   PARATHYROIDECTOMY N/A  11/09/2021   Procedure: TOTAL PARATHYROIDECTOMY;  Surgeon: Armandina Gemma, MD;  Location: [facility];  Service: General;  Laterality: N/A;   PERIPHERAL VASCULAR BALLOON ANGIOPLASTY  01/08/2018   Procedure: PERIPHERAL VASCULAR BALLOON ANGIOPLASTY;  Surgeon: [name], MD;  Location: [facility] CV [facility];  Service: Cardiovascular;;  innominate vein   THROMBECTOMY W/ EMBOLECTOMY Right 11/16/2016   Procedure: THROMBECTOMY REVISION OF ARTERIOVENOUS FISTULA - RIGHT ARM;  Surgeon: [name], MD;  Location: [facility];  Service: Vascular;  Laterality: Right;   TUBAL LIGATION  2014   Patient Active Problem List   Diagnosis Date Noted   Secondary hyperparathyroidism of renal origin ([place]) 11/09/2021   Hyperparathyroidism, secondary ([place]) 11/05/2021   Cervical cancer screening 10/17/2021   Routine screening for STI (sexually transmitted infection) 10/17/2021   Uterine prolapse 10/17/2021   Dyspareunia in female 10/17/2021   Anxiety state 06/05/2018   Healthcare maintenance 11/21/2017   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  Sexual abuse: No  PRECAUTIONS: none  SUBJECTIVE: I feel a little bit better. The bleeding has not stopped. I have a medicine to help with the bleeding.   PAIN:  Are you having pain? Yes: NPRS scale: 5/10 Pain location: lower abdomen Pain description: feels like something is pushing down Aggravating factors: pain with bowel movement, urination, walking a lot, intercourse Relieving factors: heat  BOWEL MOVEMENT Pain with bowel movement: Yes Type of bowel movement:Type ([place] Stool  Scale) Type 6 and Frequency 1-2 times per day Fully empty rectum: Yes: not all the time Leakage: No Pads: No Fiber supplement: No  URINATION Pain with urination: Yes Fully empty bladder: No Stream: Strong Urgency: No Frequency: 1 time per day, sometimes feel the sensation to urinate but does not urinate Leakage: none Pads: No  INTERCOURSE Pain with intercourse: Initial Penetration and During Penetration Ability to have vaginal penetration:  Yes:     OBJECTIVE:   DIAGNOSTIC FINDINGS:  None  COGNITION:  Overall cognitive status: Within functional limits for tasks assessed      POSTURE:  Good  PALPATION: Internal Pelvic Floor Not assessed today due to pain and still bleeding from last internal exam 2 weeks ago.   External Perineal Exam tenderness located in the levator ani, bulbocavernosus, hip adductors, hamstring, gluteal, piriformis, lumbar.   GENERAL Abdomen is tender Bilateral hips have full ROM but pain at the end range  LUMBARAROM/PROM  A/PROM A/PROM  12/26/2021  Extension Decreased by 25%  Right lateral flexion Decreased by 25%   (Blank rows = not tested)   LE MMT:  MMT Right 12/26/2021 Left 12/26/2021  Hip extension 4/5 4/5  Hip abduction 4/5 4/5   PELVIC MMT:   MMT  12/26/2021  Vaginal   Internal Anal Sphincter   External Anal Sphincter   Puborectalis   Diastasis Recti   (Blank rows = not tested)   TONE: incresaed      TODAY'S TREATMENT  01/18/2022; Therapeutic exercise: knee fallout 3x hold 15 sec right and left; single knee to chest 3x each side holding for 10 sec;  lumbar rotation 10x Manual: Joint mobilization: to right hip for distraction, inferior glide, and lateral glide grade 3; PA and rotation glides to L1-L5 grade 5, soft tissue work to the lumbar paraspinals; Myofascial release: using a suction cup and iastim to the low thoracic and lumbar region and along the quadratus, quadruped pulling the tissue from the sides to the abdomen  to release the anterior trunk, release right and left abdominals through the restrictions.  EVAL completed today with an interpreter present.    PATIENT EDUCATION: Education details: Access Code: [email or web address] Person educated: Patient, interpreter Education method: Explanation, Demonstration, and Handouts Education comprehension: verbalized understanding and returned demonstration   HOME EXERCISE PROGRAM: Access Code: [email or web address] URL: [email or web address] Date: 01/18/2022 Prepared by: Earlie Counts  Exercises - Supine Single Knee to Chest  - 2 x daily - 7 x weekly - 1 sets - 3 reps - 15 sec hold - Supine Lower Trunk Rotation  - 2 x daily - 7 x weekly - 1 sets - 5 reps ASSESSMENT:  CLINICAL IMPRESSION: Patient is a 36 y.o. female who was seen today for physical therapy  treatment for pelvic pain and levator ani spasm.  Patient pain decreased to 4/10 after the manual work. She was able to bring her kness to chest at the end of treatment due to the beginning was too painful. She was not able to do a bridge in the beginning but after manual work was able to. Patient does better to go slowly in treatment due to her pain . Patient would benefit from skilled therapy to improve muscle elongation and reduction of trigger points to improve quality of life.    OBJECTIVE IMPAIRMENTS decreased activity tolerance, decreased coordination, decreased endurance, decreased strength, increased fascial restrictions, increased muscle spasms, and pain.   ACTIVITY LIMITATIONS cleaning, community activity, driving, meal prep, laundry, and shopping.   PERSONAL FACTORS ESRD (end stage renal disease , c-section 2 times, Hemodialysis are also affecting patient's functional outcome.    REHAB POTENTIAL: Excellent  CLINICAL DECISION MAKING: Evolving/moderate complexity  EVALUATION COMPLEXITY: Moderate   GOALS: Goals reviewed with patient? Yes  SHORT TERM GOALS:  Patient is independent with initial  HEP for hip stretches and diaphragmatic breathing.  Target date: 01/23/2022 Goal status: INITIAL  2.  Patient educated on vaginal health and moisturizers to reduce burning with urination.  Target date: 01/23/2022 Goal status: INITIAL  3.  Patient understands how to perform abdominal massage and perineal massage.   Target date: 01/23/2022 Goal status: INITIAL   LONG TERM GOALS:  Patient independent with advanced HEP to elongate pelvic floor muscles and hip strength Target date: 03/20/2022 Goal status: INITIAL  2.  Patient able to vaginal penetration for vaginal exam with pain level decreased </= 2-3/10 due to elongation of tissue.  Target date: 03/20/2022 Goal status: INITIAL  3.  Patient able to walk for 15 to 20 minutes with pain level </= 2-310 due to improve lengthen tissue and endurance.  Target date: 03/20/2022 Goal status: INITIAL  4.  Patient able to  have penile penetration vaginally with pain >/= 1/10 due to improved tissue elongation and relaxation.  Target date: 03/20/2022 Goal status: INITIAL  5.  Patient able to perform her daily tasks with pain level <.= 2-3/10 due to increased endurance  Target date: 03/20/2022 Goal status: INITIAL   PLAN: PT FREQUENCY: 1-2x/week  PT DURATION: 12 weeks  PLANNED INTERVENTIONS: Therapeutic exercises, Therapeutic activity, Neuromuscular re-education, Patient/Family education, Joint mobilization, Dry Needling, Electrical stimulation, Spinal mobilization, Cryotherapy, Moist heat, Ultrasound, Biofeedback, and Manual therapy  PLAN FOR NEXT SESSION: diaphragmatic breathing, hip stretches, manual work to abdomen and back and hips, vaginal health, educate on abdominal massage.   Earlie Counts, PT 01/18/22 1:00 PM  [place] at [facility] [address] [place], [place], [address] Phone: [phone]   Fax:  [phone]  PHYSICAL THERAPY DISCHARGE SUMMARY  Visits from Start of Care:  2  Current functional level related to goals / functional outcomes: See above. Patient has not attended other scheduled visits by cancelling or no-show. She has reported she is working on getting a kidney transplant so it has taken up many appointments. Patient reported on 04/03/2022 to be discharged at this time.    Remaining deficits: See above.    Education / Equipment: HEP   Patient agrees to discharge. Patient goals were not met. Patient is being discharged due to the patient's request. Thank you for the referral. Earlie Counts, PT 04/03/22 1:29 PM

## 2022-01-19 DIAGNOSIS — N2581 Secondary hyperparathyroidism of renal origin: Secondary | ICD-10-CM | POA: Diagnosis not present

## 2022-01-19 DIAGNOSIS — N042 Nephrotic syndrome with diffuse membranous glomerulonephritis: Secondary | ICD-10-CM | POA: Diagnosis not present

## 2022-01-19 DIAGNOSIS — L299 Pruritus, unspecified: Secondary | ICD-10-CM | POA: Diagnosis not present

## 2022-01-19 DIAGNOSIS — Z992 Dependence on renal dialysis: Secondary | ICD-10-CM | POA: Diagnosis not present

## 2022-01-19 DIAGNOSIS — N186 End stage renal disease: Secondary | ICD-10-CM | POA: Diagnosis not present

## 2022-01-22 DIAGNOSIS — L299 Pruritus, unspecified: Secondary | ICD-10-CM | POA: Diagnosis not present

## 2022-01-22 DIAGNOSIS — N186 End stage renal disease: Secondary | ICD-10-CM | POA: Diagnosis not present

## 2022-01-22 DIAGNOSIS — Z992 Dependence on renal dialysis: Secondary | ICD-10-CM | POA: Diagnosis not present

## 2022-01-22 DIAGNOSIS — N2581 Secondary hyperparathyroidism of renal origin: Secondary | ICD-10-CM | POA: Diagnosis not present

## 2022-01-23 ENCOUNTER — Encounter: Payer: BC Managed Care – PPO | Admitting: Physical Therapy

## 2022-01-24 DIAGNOSIS — Z992 Dependence on renal dialysis: Secondary | ICD-10-CM | POA: Diagnosis not present

## 2022-01-24 DIAGNOSIS — N2581 Secondary hyperparathyroidism of renal origin: Secondary | ICD-10-CM | POA: Diagnosis not present

## 2022-01-24 DIAGNOSIS — N186 End stage renal disease: Secondary | ICD-10-CM | POA: Diagnosis not present

## 2022-01-24 DIAGNOSIS — L299 Pruritus, unspecified: Secondary | ICD-10-CM | POA: Diagnosis not present

## 2022-01-26 DIAGNOSIS — N186 End stage renal disease: Secondary | ICD-10-CM | POA: Diagnosis not present

## 2022-01-26 DIAGNOSIS — L299 Pruritus, unspecified: Secondary | ICD-10-CM | POA: Diagnosis not present

## 2022-01-26 DIAGNOSIS — N2581 Secondary hyperparathyroidism of renal origin: Secondary | ICD-10-CM | POA: Diagnosis not present

## 2022-01-26 DIAGNOSIS — Z992 Dependence on renal dialysis: Secondary | ICD-10-CM | POA: Diagnosis not present

## 2022-01-29 DIAGNOSIS — N2581 Secondary hyperparathyroidism of renal origin: Secondary | ICD-10-CM | POA: Diagnosis not present

## 2022-01-29 DIAGNOSIS — L299 Pruritus, unspecified: Secondary | ICD-10-CM | POA: Diagnosis not present

## 2022-01-29 DIAGNOSIS — N186 End stage renal disease: Secondary | ICD-10-CM | POA: Diagnosis not present

## 2022-01-29 DIAGNOSIS — Z992 Dependence on renal dialysis: Secondary | ICD-10-CM | POA: Diagnosis not present

## 2022-01-31 DIAGNOSIS — N2581 Secondary hyperparathyroidism of renal origin: Secondary | ICD-10-CM | POA: Diagnosis not present

## 2022-01-31 DIAGNOSIS — Z992 Dependence on renal dialysis: Secondary | ICD-10-CM | POA: Diagnosis not present

## 2022-01-31 DIAGNOSIS — N186 End stage renal disease: Secondary | ICD-10-CM | POA: Diagnosis not present

## 2022-01-31 DIAGNOSIS — L299 Pruritus, unspecified: Secondary | ICD-10-CM | POA: Diagnosis not present

## 2022-02-01 ENCOUNTER — Ambulatory Visit: Payer: BC Managed Care – PPO | Admitting: Internal Medicine

## 2022-02-01 ENCOUNTER — Encounter: Payer: Self-pay | Admitting: Internal Medicine

## 2022-02-01 NOTE — Progress Notes (Deleted)
? ? ?Name: Karla Garner  ?MRN/ DOB: [id number], 1986/09/30    ?Age/ Sex: 36 y.o., female   ? ?PCP: Ladell Pier, MD   ?Reason for Endocrinology Evaluation: Hypothyroid/ S/P parathyroidectomy  ?   ?Date of Initial Endocrinology Evaluation: 02/01/2022   ? ? ?HPI: ?[name] is a 36 y.o. 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 The patient presented for initial endocrinology clinic visit on 02/01/2022 for consultative assistance with her hypothyroid/S/P parathyroidectomy.  ? ?Patient has ESRD on HD since 02/2013, due to biopsy-proven IgA nephropathy.  She developed secondary hyperparathyroidism of renal origin, she is S/P parathyroidectomy on 11/09/2021 with the removal of all 4 parathyroid glands, pathology report consistent with hypercellularity. ? ? ?Postoperatively the patient developed severe hypocalcemia with a nadir of 6.4 mg/DL (uncorrected), 7.13 mg/DL(corrected) ? ? ?Patient [facility]  transplant team ?She also follows with Dr. Colette Ribas ESRD ? ?HISTORY:  ?Past Medical History:  ?Past Medical History:  ?Diagnosis Date  ? Anemia   ? Depression   ? Dysuria 11/04/2018  ? ESRD (end stage renal disease) ([place])   ? from IgA nephritis  ? Glomerulonephritis   ? Hemodialysis patient [facility])   ? Hypertension   ? Menorrhagia with regular cycle 11/04/2018  ? Normocytic anemia 08/02/2015  ? PONV (postoperative nausea and vomiting)   ? was on dialysis i during pregnancy2014  ? Swelling of right side of face 11/01/2017  ? Thrombocytopenia ([place])   ? 07/2015 in setting of sepsis  ? ?Past Surgical History:  ?Past Surgical History:  ?Procedure Laterality Date  ? A/V FISTULAGRAM N/A 01/08/2018  ? Procedure: A/V FISTULAGRAM - right arm;  Surgeon: [name], MD;  Location: [place] [facility];  Service: Cardiovascular;  Laterality: N/A;  ? AV FISTULA PLACEMENT Left   ? AV FISTULA PLACEMENT Right 04/14/2015  ? Procedure: CREATION  OF RIGHT RADIOCEPHALIC ARTERIOVENOUS (AV) FISTULA ;  Surgeon: Angelia Mould, MD;  Location: [facility] OR;  Service: Vascular;  Laterality: Right;  ? AV FISTULA PLACEMENT Right 11/22/2016  ? Procedure: ARTERIOVENOUS (AV) FISTULA CREATION;  Surgeon: [name], MD;  Location: [place];  Service: Vascular;  Laterality: Right;  ? CESAREAN SECTION    ? 2009  ? ESOPHAGOGASTRODUODENOSCOPY N/A 11/24/2016  ? Procedure: ESOPHAGOGASTRODUODENOSCOPY (EGD);  Surgeon: [name], MD;  Location: [facility] ENDOSCOPY;  Service: Endoscopy;  Laterality: N/A;  ? EXCHANGE OF A DIALYSIS CATHETER Right 04/14/2015  ? Procedure: EXCHANGE OF RIGHT INTERNAL JUGULAR DIALYSIS CATHETER;  Surgeon: Angelia Mould, MD;  Location: [place];  Service: Vascular;  Laterality: Right;  ? INSERTION OF DIALYSIS CATHETER Left 11/22/2016  ? Procedure: INSERTION OF DIALYSIS CATHETER - LEFT INTERNAL JUGULAR PLACEMENT;  Surgeon: [name], MD;  Location: [place];  Service: Vascular;  Laterality: Left;  ? IR AV DIALY SHUNT INTRO NEEDLE/INTRAC INITIAL W/PTA/STENT/IMG RT Right 02/15/2020  ? IR AV DIALY SHUNT INTRO NEEDLE/INTRACATH INITIAL W/PTA/IMG LEFT  10/18/2020  ? IR DIALY SHUNT INTRO NEEDLE/INTRACATH INITIAL W/IMG RIGHT Right 07/26/2017  ? IR DIALY SHUNT INTRO NEEDLE/INTRACATH INITIAL W/IMG RIGHT Right 10/18/2017  ? IR DIALY SHUNT INTRO NEEDLE/INTRACATH INITIAL W/IMG RIGHT Right 03/10/2018  ? IR DIALY SHUNT INTRO NEEDLE/INTRACATH INITIAL W/IMG RIGHT Right 12/30/2018  ? IR PTA ADDL CENTRAL DIALYSIS SEG THRU DIALY CIRCUIT LEFT Left 10/18/2020  ? IR RADIOLOGIST EVAL & MGMT  07/02/2019  ? IR REMOVAL TUN CV CATH W/O FL  03/06/2017  ?  IR TRANSCATH PLC STENT 1ST ART NOT LE CV CAR VERT CAR  12/30/2018  ? IR US GUIDE VASC ACCESS RIGHT  12/30/2018  ? IR US GUIDE VASC ACCESS RIGHT  12/30/2018  ? IR US GUIDE VASC ACCESS RIGHT  02/15/2020  ? IR VENO/EXT/UNI LEFT  12/30/2018  ? LIGATION OF ARTERIOVENOUS  FISTULA Left 11/05/2014  ? Procedure: LIGATION OF LEFT ARM   BRACHIO-CEPHALIC ARTERIOVENOUS  FISTULA ,& REPAIR OF BRACHIAL ARTERY.;  Surgeon: Mal [name], MD;  Location: [place];  Service: Vascular;  Laterality: Left;  ? PARATHYROIDECTOMY N/A 11/09/2021  ? Procedure: TOTAL PARATHYROIDECTOMY;  Surgeon: Armandina Gemma, MD;  Location: [place];  Service: General;  Laterality: N/A;  ? PERIPHERAL VASCULAR BALLOON ANGIOPLASTY  01/08/2018  ? Procedure: PERIPHERAL VASCULAR BALLOON ANGIOPLASTY;  Surgeon: [name], MD;  Location: [place] CV LAB;  Service: Cardiovascular;;  innominate vein  ? THROMBECTOMY W/ EMBOLECTOMY Right 11/16/2016  ? Procedure: THROMBECTOMY REVISION OF ARTERIOVENOUS FISTULA - RIGHT ARM;  Surgeon: [name], MD;  Location: [place];  Service: Vascular;  Laterality: Right;  ? TUBAL LIGATION  2014  ?  ?Social History:  reports that she has never smoked. She has never used smokeless tobacco. She reports that she does not drink alcohol and does not use drugs. ?Family History: family history includes Other in her father and mother. ? ? ?HOME MEDICATIONS: ?Allergies as of 02/01/2022   ?No Known Allergies ?  ? ?  ?Medication List  ?  ? ?  ? Accurate as of February 01, 2022  7:03 AM. If you have any questions, ask your nurse or doctor.  ?  ?  ? ?  ? ?amLODipine 10 MG tablet ?Commonly known as: NORVASC ?Take 10 mg by mouth at bedtime. ?  ?amlodipine-atorvastatin 10-10 MG tablet ?Commonly known as: CADUET ?Take 1 tablet by mouth daily. ?  ?B-Complex/B-12 Tabs ?Take 1 tablet by mouth daily. ?  ?calcitRIOL 0.5 MCG capsule ?Commonly known as: ROCALTROL ?Take 0.5 mcg by mouth daily. ?  ?calcium acetate (Phos Binder) 667 MG/5ML Soln ?Commonly known as: PHOSLYRA ?Take by mouth 3 (three) times daily with meals. ?  ?cyclobenzaprine 5 MG tablet ?Commonly known as: FLEXERIL ?Take 1 tablet (5 mg total) by mouth 3 (three) times daily as needed for muscle spasms. ?  ?DIALYVITE 800 WITH ZINC 0.8 MG Tabs ?Take 1 tablet by mouth daily. ?  ?diphenhydrAMINE 25 MG tablet ?Commonly  known as: BENADRYL ?Take 25 mg by mouth every 6 (six) hours as needed for itching. ?  ?ferric citrate 1 GM 210 MG(Fe) tablet ?Commonly known as: AURYXIA ?Take 420-630 mg by mouth See admin instructions. Take 630 mg with each meal and large snack, take 420 mg with each small snack ?  ?ferrous sulfate 325 (65 FE) MG tablet ?Take 325 mg by mouth daily with breakfast. ?  ?folic acid-vitamin b complex-vitamin c-selenium-zinc 3 MG Tabs tablet ?Take 1 tablet by mouth daily. ?  ?levothyroxine 50 MCG tablet ?Commonly known as: SYNTHROID ?Take 50 mcg by mouth daily. ?  ?lidocaine 2 % jelly ?Commonly known as: XYLOCAINE ?Apply 1 application topically as needed (place vaginally). ?  ?megestrol 40 MG tablet ?Commonly known as: MEGACE ?Take 1 tablet (40 mg total) by mouth 2 (two) times daily. ?  ?multivitamin with minerals Tabs tablet ?Take 1 tablet by mouth daily. ?  ?oxyCODONE 5 MG immediate release tablet ?Commonly known as: Oxy IR/ROXICODONE ?Take 1 tablet (5 mg total) by mouth every 4 (four) hours as  needed for moderate pain. ?  ? ?  ?  ? ? ?REVIEW OF SYSTEMS: ?A comprehensive ROS was conducted with the patient and is negative except as per HPI and below:  ?ROS  ? ? ? ?OBJECTIVE:  ?VS: LMP 01/11/2022   ? ?Wt Readings from Last 3 Encounters:  ?01/11/22 96 lb (43.5 kg)  ?12/12/21 95 lb 6.4 oz (43.3 kg)  ?12/12/21 88 lb (39.9 kg)  ? ? ? ?EXAM: ?General: Pt appears well and is in NAD  ?Hydration: Well-hydrated with moist mucous membranes and good skin turgor  ?Eyes: External eye exam normal without stare, lid lag or exophthalmos.  EOM intact.  PERRL.  ?Ears, Nose, Throat: Hearing: Grossly intact bilaterally ?Dental: Good dentition  ?Throat: Clear without mass, erythema or exudate  ?Neck: General: Supple without adenopathy. ?Thyroid: Thyroid size normal.  No goiter or nodules appreciated. No thyroid bruit.  ?Lungs: Clear with good BS bilat with no rales, rhonchi, or wheezes  ?Heart: Auscultation: RRR.  ?Abdomen: Normoactive bowel  sounds, soft, nontender, without masses or organomegaly palpable  ?Extremities: Gait and station: Normal gait  ?Digits and nails: No clubbing, cyanosis, petechiae, or nodes ?Head and neck: Normal alignm

## 2022-02-02 DIAGNOSIS — N186 End stage renal disease: Secondary | ICD-10-CM | POA: Diagnosis not present

## 2022-02-02 DIAGNOSIS — Z992 Dependence on renal dialysis: Secondary | ICD-10-CM | POA: Diagnosis not present

## 2022-02-02 DIAGNOSIS — N2581 Secondary hyperparathyroidism of renal origin: Secondary | ICD-10-CM | POA: Diagnosis not present

## 2022-02-02 DIAGNOSIS — L299 Pruritus, unspecified: Secondary | ICD-10-CM | POA: Diagnosis not present

## 2022-02-05 DIAGNOSIS — N2581 Secondary hyperparathyroidism of renal origin: Secondary | ICD-10-CM | POA: Diagnosis not present

## 2022-02-05 DIAGNOSIS — N186 End stage renal disease: Secondary | ICD-10-CM | POA: Diagnosis not present

## 2022-02-05 DIAGNOSIS — Z992 Dependence on renal dialysis: Secondary | ICD-10-CM | POA: Diagnosis not present

## 2022-02-05 DIAGNOSIS — L299 Pruritus, unspecified: Secondary | ICD-10-CM | POA: Diagnosis not present

## 2022-02-07 DIAGNOSIS — L299 Pruritus, unspecified: Secondary | ICD-10-CM | POA: Diagnosis not present

## 2022-02-07 DIAGNOSIS — N2581 Secondary hyperparathyroidism of renal origin: Secondary | ICD-10-CM | POA: Diagnosis not present

## 2022-02-07 DIAGNOSIS — Z992 Dependence on renal dialysis: Secondary | ICD-10-CM | POA: Diagnosis not present

## 2022-02-07 DIAGNOSIS — N186 End stage renal disease: Secondary | ICD-10-CM | POA: Diagnosis not present

## 2022-02-09 DIAGNOSIS — Z992 Dependence on renal dialysis: Secondary | ICD-10-CM | POA: Diagnosis not present

## 2022-02-09 DIAGNOSIS — N186 End stage renal disease: Secondary | ICD-10-CM | POA: Diagnosis not present

## 2022-02-09 DIAGNOSIS — L299 Pruritus, unspecified: Secondary | ICD-10-CM | POA: Diagnosis not present

## 2022-02-09 DIAGNOSIS — N2581 Secondary hyperparathyroidism of renal origin: Secondary | ICD-10-CM | POA: Diagnosis not present

## 2022-02-12 DIAGNOSIS — N186 End stage renal disease: Secondary | ICD-10-CM | POA: Diagnosis not present

## 2022-02-12 DIAGNOSIS — Z992 Dependence on renal dialysis: Secondary | ICD-10-CM | POA: Diagnosis not present

## 2022-02-12 DIAGNOSIS — L299 Pruritus, unspecified: Secondary | ICD-10-CM | POA: Diagnosis not present

## 2022-02-12 DIAGNOSIS — N2581 Secondary hyperparathyroidism of renal origin: Secondary | ICD-10-CM | POA: Diagnosis not present

## 2022-02-14 DIAGNOSIS — L299 Pruritus, unspecified: Secondary | ICD-10-CM | POA: Diagnosis not present

## 2022-02-14 DIAGNOSIS — Z992 Dependence on renal dialysis: Secondary | ICD-10-CM | POA: Diagnosis not present

## 2022-02-14 DIAGNOSIS — N2581 Secondary hyperparathyroidism of renal origin: Secondary | ICD-10-CM | POA: Diagnosis not present

## 2022-02-14 DIAGNOSIS — N186 End stage renal disease: Secondary | ICD-10-CM | POA: Diagnosis not present

## 2022-02-15 ENCOUNTER — Ambulatory Visit: Payer: BC Managed Care – PPO | Admitting: Internal Medicine

## 2022-02-16 DIAGNOSIS — L299 Pruritus, unspecified: Secondary | ICD-10-CM | POA: Diagnosis not present

## 2022-02-16 DIAGNOSIS — Z992 Dependence on renal dialysis: Secondary | ICD-10-CM | POA: Diagnosis not present

## 2022-02-16 DIAGNOSIS — N2581 Secondary hyperparathyroidism of renal origin: Secondary | ICD-10-CM | POA: Diagnosis not present

## 2022-02-16 DIAGNOSIS — N186 End stage renal disease: Secondary | ICD-10-CM | POA: Diagnosis not present

## 2022-02-18 DIAGNOSIS — Z992 Dependence on renal dialysis: Secondary | ICD-10-CM | POA: Diagnosis not present

## 2022-02-18 DIAGNOSIS — N042 Nephrotic syndrome with diffuse membranous glomerulonephritis: Secondary | ICD-10-CM | POA: Diagnosis not present

## 2022-02-18 DIAGNOSIS — N186 End stage renal disease: Secondary | ICD-10-CM | POA: Diagnosis not present

## 2022-02-19 DIAGNOSIS — L299 Pruritus, unspecified: Secondary | ICD-10-CM | POA: Diagnosis not present

## 2022-02-19 DIAGNOSIS — N186 End stage renal disease: Secondary | ICD-10-CM | POA: Diagnosis not present

## 2022-02-19 DIAGNOSIS — N2581 Secondary hyperparathyroidism of renal origin: Secondary | ICD-10-CM | POA: Diagnosis not present

## 2022-02-19 DIAGNOSIS — Z992 Dependence on renal dialysis: Secondary | ICD-10-CM | POA: Diagnosis not present

## 2022-02-21 DIAGNOSIS — Z992 Dependence on renal dialysis: Secondary | ICD-10-CM | POA: Diagnosis not present

## 2022-02-21 DIAGNOSIS — N186 End stage renal disease: Secondary | ICD-10-CM | POA: Diagnosis not present

## 2022-02-21 DIAGNOSIS — L299 Pruritus, unspecified: Secondary | ICD-10-CM | POA: Diagnosis not present

## 2022-02-21 DIAGNOSIS — N2581 Secondary hyperparathyroidism of renal origin: Secondary | ICD-10-CM | POA: Diagnosis not present

## 2022-02-23 DIAGNOSIS — Z992 Dependence on renal dialysis: Secondary | ICD-10-CM | POA: Diagnosis not present

## 2022-02-23 DIAGNOSIS — L299 Pruritus, unspecified: Secondary | ICD-10-CM | POA: Diagnosis not present

## 2022-02-23 DIAGNOSIS — N186 End stage renal disease: Secondary | ICD-10-CM | POA: Diagnosis not present

## 2022-02-23 DIAGNOSIS — N2581 Secondary hyperparathyroidism of renal origin: Secondary | ICD-10-CM | POA: Diagnosis not present

## 2022-02-26 DIAGNOSIS — N186 End stage renal disease: Secondary | ICD-10-CM | POA: Diagnosis not present

## 2022-02-26 DIAGNOSIS — N2581 Secondary hyperparathyroidism of renal origin: Secondary | ICD-10-CM | POA: Diagnosis not present

## 2022-02-26 DIAGNOSIS — Z992 Dependence on renal dialysis: Secondary | ICD-10-CM | POA: Diagnosis not present

## 2022-02-26 DIAGNOSIS — L299 Pruritus, unspecified: Secondary | ICD-10-CM | POA: Diagnosis not present

## 2022-02-28 DIAGNOSIS — L299 Pruritus, unspecified: Secondary | ICD-10-CM | POA: Diagnosis not present

## 2022-02-28 DIAGNOSIS — N186 End stage renal disease: Secondary | ICD-10-CM | POA: Diagnosis not present

## 2022-02-28 DIAGNOSIS — N2581 Secondary hyperparathyroidism of renal origin: Secondary | ICD-10-CM | POA: Diagnosis not present

## 2022-02-28 DIAGNOSIS — Z992 Dependence on renal dialysis: Secondary | ICD-10-CM | POA: Diagnosis not present

## 2022-03-02 DIAGNOSIS — L299 Pruritus, unspecified: Secondary | ICD-10-CM | POA: Diagnosis not present

## 2022-03-02 DIAGNOSIS — Z992 Dependence on renal dialysis: Secondary | ICD-10-CM | POA: Diagnosis not present

## 2022-03-02 DIAGNOSIS — N186 End stage renal disease: Secondary | ICD-10-CM | POA: Diagnosis not present

## 2022-03-02 DIAGNOSIS — N2581 Secondary hyperparathyroidism of renal origin: Secondary | ICD-10-CM | POA: Diagnosis not present

## 2022-03-05 DIAGNOSIS — N186 End stage renal disease: Secondary | ICD-10-CM | POA: Diagnosis not present

## 2022-03-05 DIAGNOSIS — N2581 Secondary hyperparathyroidism of renal origin: Secondary | ICD-10-CM | POA: Diagnosis not present

## 2022-03-05 DIAGNOSIS — L299 Pruritus, unspecified: Secondary | ICD-10-CM | POA: Diagnosis not present

## 2022-03-05 DIAGNOSIS — Z992 Dependence on renal dialysis: Secondary | ICD-10-CM | POA: Diagnosis not present

## 2022-03-07 DIAGNOSIS — Z992 Dependence on renal dialysis: Secondary | ICD-10-CM | POA: Diagnosis not present

## 2022-03-07 DIAGNOSIS — N2581 Secondary hyperparathyroidism of renal origin: Secondary | ICD-10-CM | POA: Diagnosis not present

## 2022-03-07 DIAGNOSIS — N186 End stage renal disease: Secondary | ICD-10-CM | POA: Diagnosis not present

## 2022-03-07 DIAGNOSIS — L299 Pruritus, unspecified: Secondary | ICD-10-CM | POA: Diagnosis not present

## 2022-03-09 DIAGNOSIS — N186 End stage renal disease: Secondary | ICD-10-CM | POA: Diagnosis not present

## 2022-03-09 DIAGNOSIS — Z992 Dependence on renal dialysis: Secondary | ICD-10-CM | POA: Diagnosis not present

## 2022-03-09 DIAGNOSIS — L299 Pruritus, unspecified: Secondary | ICD-10-CM | POA: Diagnosis not present

## 2022-03-09 DIAGNOSIS — N2581 Secondary hyperparathyroidism of renal origin: Secondary | ICD-10-CM | POA: Diagnosis not present

## 2022-03-12 DIAGNOSIS — L299 Pruritus, unspecified: Secondary | ICD-10-CM | POA: Diagnosis not present

## 2022-03-12 DIAGNOSIS — N2581 Secondary hyperparathyroidism of renal origin: Secondary | ICD-10-CM | POA: Diagnosis not present

## 2022-03-12 DIAGNOSIS — N186 End stage renal disease: Secondary | ICD-10-CM | POA: Diagnosis not present

## 2022-03-12 DIAGNOSIS — Z992 Dependence on renal dialysis: Secondary | ICD-10-CM | POA: Diagnosis not present

## 2022-03-13 ENCOUNTER — Telehealth: Payer: Self-pay | Admitting: Physical Therapy

## 2022-03-13 ENCOUNTER — Encounter: Payer: BC Managed Care – PPO | Attending: Obstetrics and Gynecology | Admitting: Physical Therapy

## 2022-03-13 DIAGNOSIS — M6281 Muscle weakness (generalized): Secondary | ICD-10-CM | POA: Insufficient documentation

## 2022-03-13 DIAGNOSIS — R252 Cramp and spasm: Secondary | ICD-10-CM | POA: Insufficient documentation

## 2022-03-13 DIAGNOSIS — R102 Pelvic and perineal pain: Secondary | ICD-10-CM | POA: Insufficient documentation

## 2022-03-13 NOTE — Telephone Encounter (Signed)
Called patient with interpreter. Patient told us she did not realize she had an appointment due to many appointments for her transplant. Patient knows about her 5/30 visit at 1:00. She will be there.  Earlie Counts, PT @5 /23/2023@ 8:52 AM

## 2022-03-14 DIAGNOSIS — N186 End stage renal disease: Secondary | ICD-10-CM | POA: Diagnosis not present

## 2022-03-14 DIAGNOSIS — N2581 Secondary hyperparathyroidism of renal origin: Secondary | ICD-10-CM | POA: Diagnosis not present

## 2022-03-14 DIAGNOSIS — L299 Pruritus, unspecified: Secondary | ICD-10-CM | POA: Diagnosis not present

## 2022-03-14 DIAGNOSIS — Z992 Dependence on renal dialysis: Secondary | ICD-10-CM | POA: Diagnosis not present

## 2022-03-16 DIAGNOSIS — L299 Pruritus, unspecified: Secondary | ICD-10-CM | POA: Diagnosis not present

## 2022-03-16 DIAGNOSIS — Z992 Dependence on renal dialysis: Secondary | ICD-10-CM | POA: Diagnosis not present

## 2022-03-16 DIAGNOSIS — N2581 Secondary hyperparathyroidism of renal origin: Secondary | ICD-10-CM | POA: Diagnosis not present

## 2022-03-16 DIAGNOSIS — N186 End stage renal disease: Secondary | ICD-10-CM | POA: Diagnosis not present

## 2022-03-19 DIAGNOSIS — N186 End stage renal disease: Secondary | ICD-10-CM | POA: Diagnosis not present

## 2022-03-19 DIAGNOSIS — Z992 Dependence on renal dialysis: Secondary | ICD-10-CM | POA: Diagnosis not present

## 2022-03-19 DIAGNOSIS — N2581 Secondary hyperparathyroidism of renal origin: Secondary | ICD-10-CM | POA: Diagnosis not present

## 2022-03-19 DIAGNOSIS — L299 Pruritus, unspecified: Secondary | ICD-10-CM | POA: Diagnosis not present

## 2022-03-20 ENCOUNTER — Encounter: Payer: BC Managed Care – PPO | Admitting: Physical Therapy

## 2022-03-21 ENCOUNTER — Ambulatory Visit: Payer: BC Managed Care – PPO | Attending: Internal Medicine | Admitting: Physician Assistant

## 2022-03-21 ENCOUNTER — Other Ambulatory Visit: Payer: Self-pay

## 2022-03-21 ENCOUNTER — Encounter: Payer: Self-pay | Admitting: Physician Assistant

## 2022-03-21 VITALS — BP 136/88 | HR 79 | Wt 87.4 lb

## 2022-03-21 DIAGNOSIS — R630 Anorexia: Secondary | ICD-10-CM

## 2022-03-21 DIAGNOSIS — E892 Postprocedural hypoparathyroidism: Secondary | ICD-10-CM

## 2022-03-21 DIAGNOSIS — D631 Anemia in chronic kidney disease: Secondary | ICD-10-CM | POA: Diagnosis not present

## 2022-03-21 DIAGNOSIS — N042 Nephrotic syndrome with diffuse membranous glomerulonephritis: Secondary | ICD-10-CM | POA: Diagnosis not present

## 2022-03-21 DIAGNOSIS — Z992 Dependence on renal dialysis: Secondary | ICD-10-CM | POA: Diagnosis not present

## 2022-03-21 DIAGNOSIS — R634 Abnormal weight loss: Secondary | ICD-10-CM | POA: Diagnosis not present

## 2022-03-21 DIAGNOSIS — L299 Pruritus, unspecified: Secondary | ICD-10-CM | POA: Diagnosis not present

## 2022-03-21 DIAGNOSIS — Z789 Other specified health status: Secondary | ICD-10-CM

## 2022-03-21 DIAGNOSIS — N920 Excessive and frequent menstruation with regular cycle: Secondary | ICD-10-CM | POA: Diagnosis not present

## 2022-03-21 DIAGNOSIS — N185 Chronic kidney disease, stage 5: Secondary | ICD-10-CM | POA: Diagnosis not present

## 2022-03-21 DIAGNOSIS — E039 Hypothyroidism, unspecified: Secondary | ICD-10-CM | POA: Diagnosis not present

## 2022-03-21 DIAGNOSIS — N186 End stage renal disease: Secondary | ICD-10-CM

## 2022-03-21 DIAGNOSIS — N2581 Secondary hyperparathyroidism of renal origin: Secondary | ICD-10-CM | POA: Diagnosis not present

## 2022-03-21 MED ORDER — MEGESTROL ACETATE 40 MG PO TABS
40.0000 mg | ORAL_TABLET | Freq: Two times a day (BID) | ORAL | 4 refills | Status: DC
  Filled 2022-03-21: qty 60, 30d supply, fill #0

## 2022-03-21 MED ORDER — FERROUS SULFATE 325 (65 FE) MG PO TABS
325.0000 mg | ORAL_TABLET | Freq: Every day | ORAL | 1 refills | Status: DC
  Filled 2022-03-21: qty 90, 90d supply, fill #0

## 2022-03-21 NOTE — Progress Notes (Signed)
Patient ID: Karla Garner, female   DOB: 06/23/1986, 36 y.o.   MRN: [id number]   Karla Garner, is a 36 y.o. female  ERX:[id number]  [id number]:[id number]  DOB - 1986/06/07  Chief Complaint  Patient presents with   Menorrhagia   Cough       Subjective:   Karla Garner is a 36 y.o. female here today with her husband for several issues.  Patient with complex medical history. June 28th sees endocrinology s/p recent secondary hyperparathyroidism with parathyroidectomy.  ESRD with HD on  Dialysis M,W,F.  C/o heavy periods.  Previously on megace but didn't like the "way it made me feel."  No specific SE or adverse reaction.  She does have f/up pending with gyn and says she is open to trying it again.  Some dizziness.  She is not taking iron tablets  C/o weight loss and poor appetite for about 1 month.  In epic 96 pounds 01/11/22 and 87 pounds today (she was 88 pounds in January).  Denies eating d/o tendencies.    No problems updated.  ALLERGIES: No Known Allergies  PAST MEDICAL HISTORY: Past Medical History:  Diagnosis Date   Anemia    Depression    Dysuria 11/04/2018   ESRD (end stage renal disease) ([place])    from IgA nephritis   Glomerulonephritis    Hemodialysis patient ([place])    Hypertension    Menorrhagia with regular cycle 11/04/2018   Normocytic anemia 08/02/2015   PONV (postoperative nausea and vomiting)    was on dialysis i during pregnancy2014   Swelling of right side of face 11/01/2017   Thrombocytopenia ([place])    07/2015 in setting of sepsis    MEDICATIONS AT HOME: Prior to Admission medications   Medication Sig Start Date End Date Taking? Authorizing Provider  amLODipine (NORVASC) 10 MG tablet Take 10 mg by mouth at bedtime. Patient not taking: Reported on 12/26/2021 09/02/21   [provider]  amlodipine-atorvastatin (CADUET) 10-10 MG tablet Take 1 tablet by mouth daily.    [provider]  B Complex Vitamins (B-COMPLEX/B-12)  TABS Take 1 tablet by mouth daily.    [provider]  B Complex-C-Zn-Folic Acid (DIALYVITE 809 WITH ZINC) 0.8 MG TABS Take 1 tablet by mouth daily. Patient not taking: Reported on 12/26/2021 03/21/20   [provider]  calcitRIOL (ROCALTROL) 0.5 MCG capsule Take 0.5 mcg by mouth daily.    [provider]  calcium acetate, Phos Binder, (PHOSLYRA) 667 MG/5ML SOLN Take by mouth 3 (three) times daily with meals.    [provider]  cyclobenzaprine (FLEXERIL) 5 MG tablet Take 1 tablet (5 mg total) by mouth 3 (three) times daily as needed for muscle spasms. 12/12/21   [name], MD  diphenhydrAMINE (BENADRYL) 25 MG tablet Take 25 mg by mouth every 6 (six) hours as needed for itching. Patient not taking: Reported on 12/26/2021    [provider]  ferric citrate (AURYXIA) 1 GM 210 MG(Fe) tablet Take 420-630 mg by mouth See admin instructions. Take 630 mg with each meal and large snack, take 420 mg with each small snack Patient not taking: Reported on 12/26/2021    [provider]  ferrous sulfate 325 (65 FE) MG tablet Take 1 tablet (325 mg total) by mouth daily with breakfast. 03/21/22   Argentina Donovan, PA-C  folic acid-vitamin b complex-vitamin c-selenium-zinc (DIALYVITE) 3 MG TABS tablet Take 1 tablet by mouth daily.    [provider]  levothyroxine (  SYNTHROID) 50 MCG tablet Take 50 mcg by mouth daily. 09/02/21   [provider]  lidocaine (XYLOCAINE) 2 % jelly Apply 1 application topically as needed (place vaginally). Patient not taking: Reported on 12/26/2021 12/12/21   [name], MD  megestrol (MEGACE) 40 MG tablet Take 1 tablet (40 mg total) by mouth 2 (two) times daily. 03/21/22   Argentina Donovan, PA-C  Multiple Vitamin (MULTIVITAMIN WITH MINERALS) TABS tablet Take 1 tablet by mouth daily.    [provider]  oxyCODONE (OXY IR/ROXICODONE) 5 MG immediate release tablet Take 1 tablet (5 mg total) by mouth  every 4 (four) hours as needed for moderate pain. Patient not taking: Reported on 12/26/2021 11/12/21   Rolm Bookbinder, MD    ROS: Neg HEENT Neg resp Neg cardiac Neg GI Neg GU Neg MS Neg psych  Objective:   Vitals:   03/21/22 1502  BP: 136/88  Pulse: 79  SpO2: 99%  Weight: 87 lb 6.4 oz (39.6 kg)   Exam General appearance : Awake, alert, not in any distress. Speech Clear. Not toxic looking, thin HEENT: Atraumatic and Normocephalic, Neck: Supple, no JVD. No cervical lymphadenopathy.  Chest: Good air entry bilaterally, CTAB.  No rales/rhonchi/wheezing CVS: S1 S2 regular, no murmurs.  A-V fistula present R arm without sign of infection Extremities: B/L Lower Ext shows no edema, both legs are warm to touch Neurology: Awake alert, and oriented X 3, CN II-XII intact, Non focal Skin: No Rash  Data Review No results found for: HGBA1C  Assessment & Plan   1. Weight loss - Thyroid Panel With TSH - PTH, Intact and Calcium  2. ESRD on hemodialysis ([place]) Keep HD m, w, f  3. S/P parathyroidectomy ([place]) - PTH, Intact and Calcium See endocrine as planned  4. Acquired hypothyroidism Continue synthroid; keep endocrine appt  5. Anemia of chronic renal failure, stage 5 (HCC) - ferrous sulfate 325 (65 FE) MG tablet; Take 1 tablet (325 mg total) by mouth daily with breakfast.  Dispense: 90 tablet; Refill: 1 - CBC with Differential/Platelet  6. Menorrhagia with regular cycle - ferrous sulfate 325 (65 FE) MG tablet; Take 1 tablet (325 mg total) by mouth daily with breakfast.  Dispense: 90 tablet; Refill: 1 - megestrol (MEGACE) 40 MG tablet; Take 1 tablet (40 mg total) by mouth 2 (two) times daily.  Dispense: 60 tablet; Refill: 4 - CBC with Differential/Platelet  7. Poor appetite - Thyroid Panel With TSH - PTH, Intact and Calcium  8. Language barrier [employer]" interpreters used and additional time performing visit was required.   Return in about 4 months (around  07/21/2022) for PCP for chronic conditions.  The patient was given clear instructions to go to ER or return to medical center if symptoms don't improve, worsen or new problems develop. The patient verbalized understanding. The patient was told to call to get lab results if they haven't heard anything in the next week.      Freeman Caldron, PA-C [facility] and [place], [place]   03/21/2022, 4:56 PM

## 2022-03-22 ENCOUNTER — Encounter: Payer: BC Managed Care – PPO | Admitting: Family Medicine

## 2022-03-22 ENCOUNTER — Other Ambulatory Visit: Payer: Self-pay | Admitting: Physician Assistant

## 2022-03-22 DIAGNOSIS — E039 Hypothyroidism, unspecified: Secondary | ICD-10-CM

## 2022-03-22 DIAGNOSIS — E349 Endocrine disorder, unspecified: Secondary | ICD-10-CM

## 2022-03-22 DIAGNOSIS — Z9889 Other specified postprocedural states: Secondary | ICD-10-CM

## 2022-03-22 DIAGNOSIS — R7989 Other specified abnormal findings of blood chemistry: Secondary | ICD-10-CM

## 2022-03-22 DIAGNOSIS — E892 Postprocedural hypoparathyroidism: Secondary | ICD-10-CM

## 2022-03-22 LAB — CBC WITH DIFFERENTIAL/PLATELET
Basophils Absolute: 0 10*3/uL (ref 0.0–0.2)
Basos: 1 %
EOS (ABSOLUTE): 0.1 10*3/uL (ref 0.0–0.4)
Eos: 2 %
Hematocrit: 38.4 % (ref 34.0–46.6)
Hemoglobin: 13.1 g/dL (ref 11.1–15.9)
Immature Grans (Abs): 0 10*3/uL (ref 0.0–0.1)
Immature Granulocytes: 0 %
Lymphocytes Absolute: 1.6 10*3/uL (ref 0.7–3.1)
Lymphs: 30 %
MCH: 30.5 pg (ref 26.6–33.0)
MCHC: 34.1 g/dL (ref 31.5–35.7)
MCV: 89 fL (ref 79–97)
Monocytes Absolute: 0.6 10*3/uL (ref 0.1–0.9)
Monocytes: 11 %
Neutrophils Absolute: 3.1 10*3/uL (ref 1.4–7.0)
Neutrophils: 56 %
Platelets: 214 10*3/uL (ref 150–450)
RBC: 4.3 x10E6/uL (ref 3.77–5.28)
RDW: 14.4 % (ref 11.7–15.4)
WBC: 5.5 10*3/uL (ref 3.4–10.8)

## 2022-03-22 LAB — THYROID PANEL WITH TSH
Free Thyroxine Index: 2.4 (ref 1.2–4.9)
T3 Uptake Ratio: 29 % (ref 24–39)
T4, Total: 8.4 ug/dL (ref 4.5–12.0)
TSH: 10.1 u[IU]/mL — ABNORMAL HIGH (ref 0.450–4.500)

## 2022-03-22 LAB — PTH, INTACT AND CALCIUM
Calcium: 9.6 mg/dL (ref 8.7–10.2)
PTH: 157 pg/mL — ABNORMAL HIGH (ref 15–65)

## 2022-03-22 MED ORDER — LEVOTHYROXINE SODIUM 50 MCG PO TABS
75.0000 ug | ORAL_TABLET | Freq: Every day | ORAL | 3 refills | Status: DC
Start: 2022-03-22 — End: 2023-05-27
  Filled 2022-03-22: qty 45, 30d supply, fill #0

## 2022-03-23 ENCOUNTER — Other Ambulatory Visit: Payer: Self-pay

## 2022-03-23 DIAGNOSIS — Z992 Dependence on renal dialysis: Secondary | ICD-10-CM | POA: Diagnosis not present

## 2022-03-23 DIAGNOSIS — N186 End stage renal disease: Secondary | ICD-10-CM | POA: Diagnosis not present

## 2022-03-23 DIAGNOSIS — L299 Pruritus, unspecified: Secondary | ICD-10-CM | POA: Diagnosis not present

## 2022-03-23 DIAGNOSIS — N2581 Secondary hyperparathyroidism of renal origin: Secondary | ICD-10-CM | POA: Diagnosis not present

## 2022-03-26 DIAGNOSIS — L299 Pruritus, unspecified: Secondary | ICD-10-CM | POA: Diagnosis not present

## 2022-03-26 DIAGNOSIS — N186 End stage renal disease: Secondary | ICD-10-CM | POA: Diagnosis not present

## 2022-03-26 DIAGNOSIS — N2581 Secondary hyperparathyroidism of renal origin: Secondary | ICD-10-CM | POA: Diagnosis not present

## 2022-03-26 DIAGNOSIS — Z992 Dependence on renal dialysis: Secondary | ICD-10-CM | POA: Diagnosis not present

## 2022-03-28 DIAGNOSIS — N2581 Secondary hyperparathyroidism of renal origin: Secondary | ICD-10-CM | POA: Diagnosis not present

## 2022-03-28 DIAGNOSIS — N186 End stage renal disease: Secondary | ICD-10-CM | POA: Diagnosis not present

## 2022-03-28 DIAGNOSIS — L299 Pruritus, unspecified: Secondary | ICD-10-CM | POA: Diagnosis not present

## 2022-03-28 DIAGNOSIS — Z992 Dependence on renal dialysis: Secondary | ICD-10-CM | POA: Diagnosis not present

## 2022-03-30 DIAGNOSIS — L299 Pruritus, unspecified: Secondary | ICD-10-CM | POA: Diagnosis not present

## 2022-03-30 DIAGNOSIS — N186 End stage renal disease: Secondary | ICD-10-CM | POA: Diagnosis not present

## 2022-03-30 DIAGNOSIS — Z992 Dependence on renal dialysis: Secondary | ICD-10-CM | POA: Diagnosis not present

## 2022-03-30 DIAGNOSIS — N2581 Secondary hyperparathyroidism of renal origin: Secondary | ICD-10-CM | POA: Diagnosis not present

## 2022-04-02 DIAGNOSIS — L299 Pruritus, unspecified: Secondary | ICD-10-CM | POA: Diagnosis not present

## 2022-04-02 DIAGNOSIS — Z992 Dependence on renal dialysis: Secondary | ICD-10-CM | POA: Diagnosis not present

## 2022-04-02 DIAGNOSIS — N2581 Secondary hyperparathyroidism of renal origin: Secondary | ICD-10-CM | POA: Diagnosis not present

## 2022-04-02 DIAGNOSIS — N186 End stage renal disease: Secondary | ICD-10-CM | POA: Diagnosis not present

## 2022-04-03 ENCOUNTER — Telehealth: Payer: Self-pay | Admitting: Physical Therapy

## 2022-04-03 ENCOUNTER — Encounter: Payer: BC Managed Care – PPO | Attending: Obstetrics and Gynecology | Admitting: Physical Therapy

## 2022-04-03 DIAGNOSIS — R102 Pelvic and perineal pain: Secondary | ICD-10-CM | POA: Insufficient documentation

## 2022-04-03 DIAGNOSIS — M6281 Muscle weakness (generalized): Secondary | ICD-10-CM | POA: Insufficient documentation

## 2022-04-03 DIAGNOSIS — R252 Cramp and spasm: Secondary | ICD-10-CM | POA: Insufficient documentation

## 2022-04-03 NOTE — Telephone Encounter (Signed)
Called patient with interpreter. The interpreter spoke to the patient . Patient reported she called at 12:45 to cancel and left a message. Patient has decided to not continue therapy at this time.  Earlie Counts, PT @6 /13/2023@ 1:25 PM

## 2022-04-04 DIAGNOSIS — Z992 Dependence on renal dialysis: Secondary | ICD-10-CM | POA: Diagnosis not present

## 2022-04-04 DIAGNOSIS — N186 End stage renal disease: Secondary | ICD-10-CM | POA: Diagnosis not present

## 2022-04-04 DIAGNOSIS — N2581 Secondary hyperparathyroidism of renal origin: Secondary | ICD-10-CM | POA: Diagnosis not present

## 2022-04-04 DIAGNOSIS — L299 Pruritus, unspecified: Secondary | ICD-10-CM | POA: Diagnosis not present

## 2022-04-06 DIAGNOSIS — N186 End stage renal disease: Secondary | ICD-10-CM | POA: Diagnosis not present

## 2022-04-06 DIAGNOSIS — Z992 Dependence on renal dialysis: Secondary | ICD-10-CM | POA: Diagnosis not present

## 2022-04-06 DIAGNOSIS — N2581 Secondary hyperparathyroidism of renal origin: Secondary | ICD-10-CM | POA: Diagnosis not present

## 2022-04-06 DIAGNOSIS — L299 Pruritus, unspecified: Secondary | ICD-10-CM | POA: Diagnosis not present

## 2022-04-09 DIAGNOSIS — Z992 Dependence on renal dialysis: Secondary | ICD-10-CM | POA: Diagnosis not present

## 2022-04-09 DIAGNOSIS — N2581 Secondary hyperparathyroidism of renal origin: Secondary | ICD-10-CM | POA: Diagnosis not present

## 2022-04-09 DIAGNOSIS — L299 Pruritus, unspecified: Secondary | ICD-10-CM | POA: Diagnosis not present

## 2022-04-09 DIAGNOSIS — N186 End stage renal disease: Secondary | ICD-10-CM | POA: Diagnosis not present

## 2022-04-11 DIAGNOSIS — Z992 Dependence on renal dialysis: Secondary | ICD-10-CM | POA: Diagnosis not present

## 2022-04-11 DIAGNOSIS — N186 End stage renal disease: Secondary | ICD-10-CM | POA: Diagnosis not present

## 2022-04-11 DIAGNOSIS — L299 Pruritus, unspecified: Secondary | ICD-10-CM | POA: Diagnosis not present

## 2022-04-11 DIAGNOSIS — N2581 Secondary hyperparathyroidism of renal origin: Secondary | ICD-10-CM | POA: Diagnosis not present

## 2022-04-13 DIAGNOSIS — Z992 Dependence on renal dialysis: Secondary | ICD-10-CM | POA: Diagnosis not present

## 2022-04-13 DIAGNOSIS — L299 Pruritus, unspecified: Secondary | ICD-10-CM | POA: Diagnosis not present

## 2022-04-13 DIAGNOSIS — N186 End stage renal disease: Secondary | ICD-10-CM | POA: Diagnosis not present

## 2022-04-13 DIAGNOSIS — N2581 Secondary hyperparathyroidism of renal origin: Secondary | ICD-10-CM | POA: Diagnosis not present

## 2022-04-16 DIAGNOSIS — N186 End stage renal disease: Secondary | ICD-10-CM | POA: Diagnosis not present

## 2022-04-16 DIAGNOSIS — L299 Pruritus, unspecified: Secondary | ICD-10-CM | POA: Diagnosis not present

## 2022-04-16 DIAGNOSIS — Z992 Dependence on renal dialysis: Secondary | ICD-10-CM | POA: Diagnosis not present

## 2022-04-16 DIAGNOSIS — N2581 Secondary hyperparathyroidism of renal origin: Secondary | ICD-10-CM | POA: Diagnosis not present

## 2022-04-18 ENCOUNTER — Encounter: Payer: Self-pay | Admitting: Family Medicine

## 2022-04-18 ENCOUNTER — Ambulatory Visit (INDEPENDENT_AMBULATORY_CARE_PROVIDER_SITE_OTHER): Payer: BC Managed Care – PPO | Admitting: Family Medicine

## 2022-04-18 ENCOUNTER — Other Ambulatory Visit: Payer: Self-pay

## 2022-04-18 VITALS — BP 104/78 | HR 98 | Wt 86.1 lb

## 2022-04-18 DIAGNOSIS — N186 End stage renal disease: Secondary | ICD-10-CM

## 2022-04-18 DIAGNOSIS — N939 Abnormal uterine and vaginal bleeding, unspecified: Secondary | ICD-10-CM | POA: Diagnosis not present

## 2022-04-18 DIAGNOSIS — Z3042 Encounter for surveillance of injectable contraceptive: Secondary | ICD-10-CM

## 2022-04-18 DIAGNOSIS — L299 Pruritus, unspecified: Secondary | ICD-10-CM | POA: Diagnosis not present

## 2022-04-18 DIAGNOSIS — R87612 Low grade squamous intraepithelial lesion on cytologic smear of cervix (LGSIL): Secondary | ICD-10-CM

## 2022-04-18 DIAGNOSIS — Z992 Dependence on renal dialysis: Secondary | ICD-10-CM | POA: Diagnosis not present

## 2022-04-18 DIAGNOSIS — N2581 Secondary hyperparathyroidism of renal origin: Secondary | ICD-10-CM | POA: Diagnosis not present

## 2022-04-18 MED ORDER — MEDROXYPROGESTERONE ACETATE 150 MG/ML IM SUSP
150.0000 mg | Freq: Once | INTRAMUSCULAR | Status: AC
  Administered 2022-04-18: 150 mg via INTRAMUSCULAR

## 2022-04-18 NOTE — Progress Notes (Signed)
   Subjective:    Patient ID: Karla Garner, female    DOB: 1986/10/04, 36 y.o.   MRN: [id number]  HPI Patient seen for AUB. This has been ongoing for 2-3 years. She will bleed for 14 days, then off for 2 weeks. She was seen a couple years ago in our office. Bleeding is heavy. She has ESRD on dialysis. Megace has worked in the past. She took a few tablets and her bleeding stopped.   Review of Systems     Objective:   Physical Exam Vitals reviewed.  Constitutional:      Appearance: Normal appearance.  HENT:     Head: Normocephalic and atraumatic.  Cardiovascular:     Rate and Rhythm: Normal rate and regular rhythm.     Pulses: Normal pulses.     Heart sounds: Normal heart sounds.  Pulmonary:     Effort: Pulmonary effort is normal.     Breath sounds: Normal breath sounds.  Abdominal:     General: Abdomen is flat.     Palpations: Abdomen is soft.     Tenderness: There is no abdominal tenderness.     Hernia: No hernia is present.  Skin:    Capillary Refill: Capillary refill takes less than 2 seconds.  Neurological:     General: No focal deficit present.     Mental Status: She is alert.  Psychiatric:        Mood and Affect: Mood normal.        Behavior: Behavior normal.        Thought Content: Thought content normal.       Assessment & Plan:  1. Abnormal uterine bleeding (AUB) Discussed options of IUD vs Depo. Patient would like to do depo shot. I discussed potential side effects. Patient to send message if having bleeding over the depo provera.  2. ESRD on hemodialysis ([place])  3. Low grade squamous intraepithelial lesion on cytologic smear of cervix (LGSIL) Rpt PAP 09/2022

## 2022-04-18 NOTE — Addendum Note (Signed)
Addended byMariane Baumgarten on: 04/18/2022 04:27 PM   Modules accepted: Orders

## 2022-04-20 DIAGNOSIS — N186 End stage renal disease: Secondary | ICD-10-CM | POA: Diagnosis not present

## 2022-04-20 DIAGNOSIS — Z992 Dependence on renal dialysis: Secondary | ICD-10-CM | POA: Diagnosis not present

## 2022-04-20 DIAGNOSIS — L299 Pruritus, unspecified: Secondary | ICD-10-CM | POA: Diagnosis not present

## 2022-04-20 DIAGNOSIS — N042 Nephrotic syndrome with diffuse membranous glomerulonephritis: Secondary | ICD-10-CM | POA: Diagnosis not present

## 2022-04-20 DIAGNOSIS — N2581 Secondary hyperparathyroidism of renal origin: Secondary | ICD-10-CM | POA: Diagnosis not present

## 2022-04-23 DIAGNOSIS — N186 End stage renal disease: Secondary | ICD-10-CM | POA: Diagnosis not present

## 2022-04-23 DIAGNOSIS — L299 Pruritus, unspecified: Secondary | ICD-10-CM | POA: Diagnosis not present

## 2022-04-23 DIAGNOSIS — N2581 Secondary hyperparathyroidism of renal origin: Secondary | ICD-10-CM | POA: Diagnosis not present

## 2022-04-23 DIAGNOSIS — Z992 Dependence on renal dialysis: Secondary | ICD-10-CM | POA: Diagnosis not present

## 2022-04-25 DIAGNOSIS — N186 End stage renal disease: Secondary | ICD-10-CM | POA: Diagnosis not present

## 2022-04-25 DIAGNOSIS — N2581 Secondary hyperparathyroidism of renal origin: Secondary | ICD-10-CM | POA: Diagnosis not present

## 2022-04-25 DIAGNOSIS — Z992 Dependence on renal dialysis: Secondary | ICD-10-CM | POA: Diagnosis not present

## 2022-04-25 DIAGNOSIS — L299 Pruritus, unspecified: Secondary | ICD-10-CM | POA: Diagnosis not present

## 2022-04-27 DIAGNOSIS — L299 Pruritus, unspecified: Secondary | ICD-10-CM | POA: Diagnosis not present

## 2022-04-27 DIAGNOSIS — N186 End stage renal disease: Secondary | ICD-10-CM | POA: Diagnosis not present

## 2022-04-27 DIAGNOSIS — Z992 Dependence on renal dialysis: Secondary | ICD-10-CM | POA: Diagnosis not present

## 2022-04-27 DIAGNOSIS — N2581 Secondary hyperparathyroidism of renal origin: Secondary | ICD-10-CM | POA: Diagnosis not present

## 2022-04-30 DIAGNOSIS — L299 Pruritus, unspecified: Secondary | ICD-10-CM | POA: Diagnosis not present

## 2022-04-30 DIAGNOSIS — N2581 Secondary hyperparathyroidism of renal origin: Secondary | ICD-10-CM | POA: Diagnosis not present

## 2022-04-30 DIAGNOSIS — Z992 Dependence on renal dialysis: Secondary | ICD-10-CM | POA: Diagnosis not present

## 2022-04-30 DIAGNOSIS — N186 End stage renal disease: Secondary | ICD-10-CM | POA: Diagnosis not present

## 2022-05-01 DIAGNOSIS — I120 Hypertensive chronic kidney disease with stage 5 chronic kidney disease or end stage renal disease: Secondary | ICD-10-CM | POA: Diagnosis not present

## 2022-05-01 DIAGNOSIS — Z01818 Encounter for other preprocedural examination: Secondary | ICD-10-CM | POA: Diagnosis not present

## 2022-05-01 DIAGNOSIS — N186 End stage renal disease: Secondary | ICD-10-CM | POA: Diagnosis not present

## 2022-05-01 DIAGNOSIS — Z992 Dependence on renal dialysis: Secondary | ICD-10-CM | POA: Diagnosis not present

## 2022-05-02 DIAGNOSIS — N186 End stage renal disease: Secondary | ICD-10-CM | POA: Diagnosis not present

## 2022-05-02 DIAGNOSIS — N2581 Secondary hyperparathyroidism of renal origin: Secondary | ICD-10-CM | POA: Diagnosis not present

## 2022-05-02 DIAGNOSIS — L299 Pruritus, unspecified: Secondary | ICD-10-CM | POA: Diagnosis not present

## 2022-05-02 DIAGNOSIS — Z992 Dependence on renal dialysis: Secondary | ICD-10-CM | POA: Diagnosis not present

## 2022-05-04 DIAGNOSIS — N186 End stage renal disease: Secondary | ICD-10-CM | POA: Diagnosis not present

## 2022-05-04 DIAGNOSIS — N2581 Secondary hyperparathyroidism of renal origin: Secondary | ICD-10-CM | POA: Diagnosis not present

## 2022-05-04 DIAGNOSIS — L299 Pruritus, unspecified: Secondary | ICD-10-CM | POA: Diagnosis not present

## 2022-05-04 DIAGNOSIS — Z992 Dependence on renal dialysis: Secondary | ICD-10-CM | POA: Diagnosis not present

## 2022-05-07 DIAGNOSIS — N186 End stage renal disease: Secondary | ICD-10-CM | POA: Diagnosis not present

## 2022-05-07 DIAGNOSIS — Z992 Dependence on renal dialysis: Secondary | ICD-10-CM | POA: Diagnosis not present

## 2022-05-07 DIAGNOSIS — L299 Pruritus, unspecified: Secondary | ICD-10-CM | POA: Diagnosis not present

## 2022-05-07 DIAGNOSIS — N2581 Secondary hyperparathyroidism of renal origin: Secondary | ICD-10-CM | POA: Diagnosis not present

## 2022-05-08 ENCOUNTER — Ambulatory Visit: Payer: BC Managed Care – PPO

## 2022-05-09 DIAGNOSIS — N186 End stage renal disease: Secondary | ICD-10-CM | POA: Diagnosis not present

## 2022-05-09 DIAGNOSIS — N2581 Secondary hyperparathyroidism of renal origin: Secondary | ICD-10-CM | POA: Diagnosis not present

## 2022-05-09 DIAGNOSIS — Z992 Dependence on renal dialysis: Secondary | ICD-10-CM | POA: Diagnosis not present

## 2022-05-09 DIAGNOSIS — L299 Pruritus, unspecified: Secondary | ICD-10-CM | POA: Diagnosis not present

## 2022-05-11 DIAGNOSIS — N186 End stage renal disease: Secondary | ICD-10-CM | POA: Diagnosis not present

## 2022-05-11 DIAGNOSIS — N2581 Secondary hyperparathyroidism of renal origin: Secondary | ICD-10-CM | POA: Diagnosis not present

## 2022-05-11 DIAGNOSIS — L299 Pruritus, unspecified: Secondary | ICD-10-CM | POA: Diagnosis not present

## 2022-05-11 DIAGNOSIS — Z992 Dependence on renal dialysis: Secondary | ICD-10-CM | POA: Diagnosis not present

## 2022-05-14 DIAGNOSIS — N2581 Secondary hyperparathyroidism of renal origin: Secondary | ICD-10-CM | POA: Diagnosis not present

## 2022-05-14 DIAGNOSIS — Z992 Dependence on renal dialysis: Secondary | ICD-10-CM | POA: Diagnosis not present

## 2022-05-14 DIAGNOSIS — L299 Pruritus, unspecified: Secondary | ICD-10-CM | POA: Diagnosis not present

## 2022-05-14 DIAGNOSIS — N186 End stage renal disease: Secondary | ICD-10-CM | POA: Diagnosis not present

## 2022-05-16 DIAGNOSIS — N2581 Secondary hyperparathyroidism of renal origin: Secondary | ICD-10-CM | POA: Diagnosis not present

## 2022-05-16 DIAGNOSIS — N186 End stage renal disease: Secondary | ICD-10-CM | POA: Diagnosis not present

## 2022-05-16 DIAGNOSIS — L299 Pruritus, unspecified: Secondary | ICD-10-CM | POA: Diagnosis not present

## 2022-05-16 DIAGNOSIS — Z992 Dependence on renal dialysis: Secondary | ICD-10-CM | POA: Diagnosis not present

## 2022-05-18 DIAGNOSIS — L299 Pruritus, unspecified: Secondary | ICD-10-CM | POA: Diagnosis not present

## 2022-05-18 DIAGNOSIS — N186 End stage renal disease: Secondary | ICD-10-CM | POA: Diagnosis not present

## 2022-05-18 DIAGNOSIS — Z992 Dependence on renal dialysis: Secondary | ICD-10-CM | POA: Diagnosis not present

## 2022-05-18 DIAGNOSIS — N2581 Secondary hyperparathyroidism of renal origin: Secondary | ICD-10-CM | POA: Diagnosis not present

## 2022-05-21 DIAGNOSIS — Z992 Dependence on renal dialysis: Secondary | ICD-10-CM | POA: Diagnosis not present

## 2022-05-21 DIAGNOSIS — L299 Pruritus, unspecified: Secondary | ICD-10-CM | POA: Diagnosis not present

## 2022-05-21 DIAGNOSIS — N186 End stage renal disease: Secondary | ICD-10-CM | POA: Diagnosis not present

## 2022-05-21 DIAGNOSIS — N042 Nephrotic syndrome with diffuse membranous glomerulonephritis: Secondary | ICD-10-CM | POA: Diagnosis not present

## 2022-05-21 DIAGNOSIS — N2581 Secondary hyperparathyroidism of renal origin: Secondary | ICD-10-CM | POA: Diagnosis not present

## 2022-05-23 DIAGNOSIS — L299 Pruritus, unspecified: Secondary | ICD-10-CM | POA: Diagnosis not present

## 2022-05-23 DIAGNOSIS — N186 End stage renal disease: Secondary | ICD-10-CM | POA: Diagnosis not present

## 2022-05-23 DIAGNOSIS — Z992 Dependence on renal dialysis: Secondary | ICD-10-CM | POA: Diagnosis not present

## 2022-05-23 DIAGNOSIS — N2581 Secondary hyperparathyroidism of renal origin: Secondary | ICD-10-CM | POA: Diagnosis not present

## 2022-05-25 DIAGNOSIS — Z01818 Encounter for other preprocedural examination: Secondary | ICD-10-CM | POA: Diagnosis not present

## 2022-05-25 DIAGNOSIS — N2581 Secondary hyperparathyroidism of renal origin: Secondary | ICD-10-CM | POA: Diagnosis not present

## 2022-05-25 DIAGNOSIS — L299 Pruritus, unspecified: Secondary | ICD-10-CM | POA: Diagnosis not present

## 2022-05-25 DIAGNOSIS — Z992 Dependence on renal dialysis: Secondary | ICD-10-CM | POA: Diagnosis not present

## 2022-05-25 DIAGNOSIS — N186 End stage renal disease: Secondary | ICD-10-CM | POA: Diagnosis not present

## 2022-05-26 DIAGNOSIS — N186 End stage renal disease: Secondary | ICD-10-CM | POA: Diagnosis not present

## 2022-05-26 DIAGNOSIS — Z992 Dependence on renal dialysis: Secondary | ICD-10-CM | POA: Diagnosis not present

## 2022-05-26 DIAGNOSIS — Z4822 Encounter for aftercare following kidney transplant: Secondary | ICD-10-CM | POA: Diagnosis not present

## 2022-05-26 DIAGNOSIS — Z940 Kidney transplant status: Secondary | ICD-10-CM | POA: Diagnosis not present

## 2022-05-26 DIAGNOSIS — R000 Tachycardia, unspecified: Secondary | ICD-10-CM | POA: Diagnosis not present

## 2022-05-26 DIAGNOSIS — I120 Hypertensive chronic kidney disease with stage 5 chronic kidney disease or end stage renal disease: Secondary | ICD-10-CM | POA: Diagnosis not present

## 2022-05-27 DIAGNOSIS — Z940 Kidney transplant status: Secondary | ICD-10-CM | POA: Diagnosis not present

## 2022-05-27 DIAGNOSIS — Z79899 Other long term (current) drug therapy: Secondary | ICD-10-CM | POA: Diagnosis not present

## 2022-05-27 DIAGNOSIS — E875 Hyperkalemia: Secondary | ICD-10-CM | POA: Diagnosis not present

## 2022-05-27 DIAGNOSIS — Z5181 Encounter for therapeutic drug level monitoring: Secondary | ICD-10-CM | POA: Diagnosis not present

## 2022-05-27 DIAGNOSIS — E871 Hypo-osmolality and hyponatremia: Secondary | ICD-10-CM | POA: Diagnosis not present

## 2022-05-27 DIAGNOSIS — I10 Essential (primary) hypertension: Secondary | ICD-10-CM | POA: Diagnosis not present

## 2022-05-27 DIAGNOSIS — Z4822 Encounter for aftercare following kidney transplant: Secondary | ICD-10-CM | POA: Diagnosis not present

## 2022-05-27 DIAGNOSIS — D849 Immunodeficiency, unspecified: Secondary | ICD-10-CM | POA: Diagnosis not present

## 2022-05-28 DIAGNOSIS — Z4822 Encounter for aftercare following kidney transplant: Secondary | ICD-10-CM | POA: Diagnosis not present

## 2022-05-28 DIAGNOSIS — Z79899 Other long term (current) drug therapy: Secondary | ICD-10-CM | POA: Diagnosis not present

## 2022-05-28 DIAGNOSIS — E878 Other disorders of electrolyte and fluid balance, not elsewhere classified: Secondary | ICD-10-CM | POA: Diagnosis not present

## 2022-05-28 DIAGNOSIS — Z5181 Encounter for therapeutic drug level monitoring: Secondary | ICD-10-CM | POA: Diagnosis not present

## 2022-05-28 DIAGNOSIS — D849 Immunodeficiency, unspecified: Secondary | ICD-10-CM | POA: Diagnosis not present

## 2022-05-28 DIAGNOSIS — Z940 Kidney transplant status: Secondary | ICD-10-CM | POA: Diagnosis not present

## 2022-05-28 DIAGNOSIS — E875 Hyperkalemia: Secondary | ICD-10-CM | POA: Diagnosis not present

## 2022-05-28 DIAGNOSIS — E871 Hypo-osmolality and hyponatremia: Secondary | ICD-10-CM | POA: Diagnosis not present

## 2022-05-29 DIAGNOSIS — Z940 Kidney transplant status: Secondary | ICD-10-CM | POA: Diagnosis not present

## 2022-05-29 DIAGNOSIS — E878 Other disorders of electrolyte and fluid balance, not elsewhere classified: Secondary | ICD-10-CM | POA: Diagnosis not present

## 2022-05-29 DIAGNOSIS — D849 Immunodeficiency, unspecified: Secondary | ICD-10-CM | POA: Diagnosis not present

## 2022-05-29 DIAGNOSIS — Z5181 Encounter for therapeutic drug level monitoring: Secondary | ICD-10-CM | POA: Diagnosis not present

## 2022-05-29 DIAGNOSIS — Z79899 Other long term (current) drug therapy: Secondary | ICD-10-CM | POA: Diagnosis not present

## 2022-05-29 DIAGNOSIS — E872 Acidosis, unspecified: Secondary | ICD-10-CM | POA: Diagnosis not present

## 2022-05-29 DIAGNOSIS — Z4822 Encounter for aftercare following kidney transplant: Secondary | ICD-10-CM | POA: Diagnosis not present

## 2022-05-30 DIAGNOSIS — Z5181 Encounter for therapeutic drug level monitoring: Secondary | ICD-10-CM | POA: Diagnosis not present

## 2022-05-30 DIAGNOSIS — Z79899 Other long term (current) drug therapy: Secondary | ICD-10-CM | POA: Diagnosis not present

## 2022-05-30 DIAGNOSIS — Z4822 Encounter for aftercare following kidney transplant: Secondary | ICD-10-CM | POA: Diagnosis not present

## 2022-05-30 DIAGNOSIS — I10 Essential (primary) hypertension: Secondary | ICD-10-CM | POA: Diagnosis not present

## 2022-05-31 DIAGNOSIS — I10 Essential (primary) hypertension: Secondary | ICD-10-CM | POA: Diagnosis not present

## 2022-05-31 DIAGNOSIS — Z4822 Encounter for aftercare following kidney transplant: Secondary | ICD-10-CM | POA: Diagnosis not present

## 2022-05-31 DIAGNOSIS — Z79899 Other long term (current) drug therapy: Secondary | ICD-10-CM | POA: Diagnosis not present

## 2022-05-31 DIAGNOSIS — D849 Immunodeficiency, unspecified: Secondary | ICD-10-CM | POA: Insufficient documentation

## 2022-05-31 DIAGNOSIS — Z5181 Encounter for therapeutic drug level monitoring: Secondary | ICD-10-CM | POA: Diagnosis not present

## 2022-06-04 DIAGNOSIS — Z5181 Encounter for therapeutic drug level monitoring: Secondary | ICD-10-CM | POA: Insufficient documentation

## 2022-06-12 ENCOUNTER — Ambulatory Visit: Payer: BC Managed Care – PPO | Admitting: Obstetrics and Gynecology

## 2022-06-19 DIAGNOSIS — Z940 Kidney transplant status: Secondary | ICD-10-CM | POA: Insufficient documentation

## 2022-06-26 DIAGNOSIS — E559 Vitamin D deficiency, unspecified: Secondary | ICD-10-CM | POA: Insufficient documentation

## 2022-07-19 ENCOUNTER — Ambulatory Visit: Payer: BC Managed Care – PPO | Admitting: Obstetrics and Gynecology

## 2022-07-19 DIAGNOSIS — I82721 Chronic embolism and thrombosis of deep veins of right upper extremity: Secondary | ICD-10-CM | POA: Insufficient documentation

## 2022-08-07 DIAGNOSIS — J302 Other seasonal allergic rhinitis: Secondary | ICD-10-CM | POA: Insufficient documentation

## 2022-08-30 ENCOUNTER — Encounter: Payer: Self-pay | Admitting: Internal Medicine

## 2022-08-30 ENCOUNTER — Ambulatory Visit: Payer: BC Managed Care – PPO | Attending: Internal Medicine | Admitting: Internal Medicine

## 2022-08-30 VITALS — BP 118/70 | HR 73 | Temp 98.1°F | Ht <= 58 in | Wt 107.0 lb

## 2022-08-30 DIAGNOSIS — Z2821 Immunization not carried out because of patient refusal: Secondary | ICD-10-CM

## 2022-08-30 DIAGNOSIS — Z3042 Encounter for surveillance of injectable contraceptive: Secondary | ICD-10-CM

## 2022-08-30 DIAGNOSIS — Z940 Kidney transplant status: Secondary | ICD-10-CM | POA: Diagnosis not present

## 2022-08-30 DIAGNOSIS — Z8742 Personal history of other diseases of the female genital tract: Secondary | ICD-10-CM

## 2022-08-30 DIAGNOSIS — E039 Hypothyroidism, unspecified: Secondary | ICD-10-CM

## 2022-08-30 DIAGNOSIS — Z0000 Encounter for general adult medical examination without abnormal findings: Secondary | ICD-10-CM

## 2022-08-30 MED ORDER — MEDROXYPROGESTERONE ACETATE 150 MG/ML IM SUSP
150.0000 mg | Freq: Once | INTRAMUSCULAR | Status: AC
  Administered 2022-08-30: 150 mg via INTRAMUSCULAR

## 2022-08-30 NOTE — Progress Notes (Signed)
Patient ID: Karla Garner, female    DOB: 04-21-1986  MRN: [id number]  CC: Physical Subjective: Karla Garner is a 36 y.o. female who presents for physical for biometrics so that she can continue to receive insurance coverage through her spouse's insurance Her concerns today include:  Patient with prior hx of history of ESRD and was on HD but had cadaver kidney transplant 05/2022 through [facility], HTN, secondary hyperparathyroidism status post total parathyroidectomy with autotransplant brachioradialis muscle, ACD, hypothyroid   Patient without any major concerns today. Since last visit with me, she had kidney transplant from a deceased donor.  She is currently on immune suppressive therapy including mycophenolate, prednisone and Prograf.  Prophylactically on valacyclovir as well.  Most recent follow-up appointment with the transplant clinic was 08/21/2022.  No longer on blood pressure medication posttransplant.  HM: She declines the flu shot.  She reports having had a COVID booster several weeks before her transplant. 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 1 tablet daily     B Complex Vitamins (B-COMPLEX/B-12) TABS Take 1 tablet by mouth daily.     Cholecalciferol (D 1000) 25 MCG (1000 UT) capsule Take by mouth.     MAGNESIUM-OXIDE 400 (240 Mg) MG tablet Take 1 tablet by mouth daily.     Multiple Vitamin (MULTIVITAMIN WITH MINERALS) TABS tablet Take 1 tablet by mouth daily.     mycophenolate (MYFORTIC) 180 MG EC tablet Take by mouth.     omeprazole (PRILOSEC) 20 MG capsule Take 20 mg by mouth daily. 1 tab daily     predniSONE (DELTASONE) 5 MG tablet Take 1 tablet by mouth daily.     sulfamethoxazole-trimethoprim (BACTRIM) 400-80 MG tablet Take by mouth.     tacrolimus (PROGRAF) 1 MG capsule Take 5mg  in the AM and 5mg  in the PM     calcium acetate, Phos Binder, (PHOSLYRA) 667 MG/5ML SOLN Take by mouth 3 (three) times daily with meals. (Patient not taking: Reported on 08/30/2022)     levothyroxine (SYNTHROID) 50 MCG tablet Take 1.5 tablets (75 mcg total) by mouth once daily. 45 tablet 3   megestrol (MEGACE) 40 MG tablet Take 1 tablet (40 mg total) by mouth 2 (two) times daily. (Patient not taking: Reported on 08/30/2022) 60 tablet 4   valGANciclovir (VALCYTE) 450 MG tablet Take 450 mg by mouth daily.     No current facility-administered medications on file prior to visit.    No Known Allergies  Social History   Socioeconomic History   Marital status: Legally Separated    Spouse name: Not on file   Number of children: 3   Years of education: Not on file  Highest education level: Not on file  Occupational History   Occupation: unemployed  Tobacco Use   Smoking status: Never   Smokeless tobacco: Never  Vaping Use   Vaping Use: Never used  Substance and Sexual Activity   Alcohol use: No    Alcohol/week: 0.0 standard drinks of alcohol   Drug use: No   Sexual activity: Not Currently    Partners: Male    Birth control/protection: Surgical  Other Topics Concern   Not on file  Social History Narrative   As of  February 2018 she has 3 children with whom she lives they are aged 5, 30 and 2.    Social Determinants of Health   Financial Resource Strain: Low Risk  (11/01/2017)   Overall Financial Resource Strain (CARDIA)    Difficulty of Paying Living Expenses: Not hard at all  Food Insecurity: No Food Insecurity (11/01/2017)   Hunger Vital Sign    Worried About Running Out of Food in the Last Year: Never true    Ran Out of Food in the Last Year: Never true  Transportation Needs: No Transportation Needs (11/01/2017)   PRAPARE - Hydrologist (Medical): No    Lack of Transportation (Non-Medical): No  Physical Activity: Unknown (11/01/2017)   Exercise Vital Sign    Days of Exercise per Week: 0 days    Minutes of Exercise per Session: Not on file  Stress: Stress Concern Present (11/01/2017)   [place]    Feeling of Stress : Very much  Social Connections: Not on file  Intimate Partner Violence: Not on file    Family History  Problem Relation Age of Onset   Other Mother        no medical problems per patient   Other Father        no medical problems per patient   Colon cancer Neg Hx    Liver cancer Neg Hx    Stomach cancer Neg Hx     Past Surgical History:  Procedure Laterality Date   A/V FISTULAGRAM N/A 01/08/2018   Procedure: A/V FISTULAGRAM - right arm;  Surgeon: [name], MD;  Location: [place] [facility];  Service: Cardiovascular;  Laterality: N/A;   AV FISTULA PLACEMENT Left    AV FISTULA PLACEMENT Right 04/14/2015   Procedure: CREATION OF RIGHT RADIOCEPHALIC ARTERIOVENOUS (AV) FISTULA ;  Surgeon: Angelia Mould, MD;  Location: [place];  Service: Vascular;  Laterality: Right;   AV FISTULA PLACEMENT Right 11/22/2016   Procedure: ARTERIOVENOUS (AV) FISTULA CREATION;  Surgeon: [name], MD;  Location: [place];  Service: Vascular;  Laterality: Right;   CESAREAN  SECTION     2009   ESOPHAGOGASTRODUODENOSCOPY N/A 11/24/2016   Procedure: ESOPHAGOGASTRODUODENOSCOPY (EGD);  Surgeon: [name], MD;  Location[facility];  Service: Endoscopy;  Laterality: N/A;   EXCHANGE OF A DIALYSIS CATHETER Right 04/14/2015   Procedure: EXCHANGE OF RIGHT INTERNAL JUGULAR DIALYSIS CATHETER;  Surgeon: Angelia Mould, MD;  Location: [place];  Service: Vascular;  Laterality: Right;   INSERTION OF DIALYSIS CATHETER Left 11/22/2016   Procedure: INSERTION OF DIALYSIS CATHETER - LEFT INTERNAL JUGULAR PLACEMENT;  Surgeon: [name], MD;  Location: [place];  Service: Vascular;  Laterality: Left;   IR AV DIALY SHUNT INTRO NEEDLE/INTRAC INITIAL W/PTA/STENT/IMG RT Right 02/15/2020   IR AV DIALY SHUNT INTRO NEEDLE/INTRACATH INITIAL W/PTA/IMG LEFT  10/18/2020   IR DIALY SHUNT INTRO  NEEDLE/INTRACATH INITIAL W/IMG RIGHT Right 07/26/2017   IR DIALY SHUNT INTRO NEEDLE/INTRACATH INITIAL W/IMG RIGHT Right 10/18/2017   IR DIALY SHUNT INTRO NEEDLE/INTRACATH INITIAL W/IMG RIGHT Right 03/10/2018   IR DIALY SHUNT INTRO NEEDLE/INTRACATH INITIAL W/IMG RIGHT Right 12/30/2018   IR PTA ADDL CENTRAL DIALYSIS SEG THRU DIALY CIRCUIT LEFT Left 10/18/2020   IR RADIOLOGIST EVAL & MGMT  07/02/2019   IR REMOVAL TUN CV CATH W/O FL  03/06/2017   IR TRANSCATH PLC STENT 1ST ART NOT LE CV CAR VERT CAR  12/30/2018   IR US GUIDE VASC ACCESS RIGHT  12/30/2018   IR US GUIDE VASC ACCESS RIGHT  12/30/2018   IR US GUIDE VASC ACCESS RIGHT  02/15/2020   IR VENO/EXT/UNI LEFT  12/30/2018   LIGATION OF ARTERIOVENOUS  FISTULA Left 11/05/2014   Procedure: LIGATION OF LEFT ARM  BRACHIO-CEPHALIC ARTERIOVENOUS  FISTULA ,& REPAIR OF BRACHIAL ARTERY.;  Surgeon: Mal [name], MD;  Location: [place];  Service: Vascular;  Laterality: Left;   PARATHYROIDECTOMY N/A 11/09/2021   Procedure: TOTAL PARATHYROIDECTOMY;  Surgeon: Armandina Gemma, MD;  Location: [place];  Service: General;  Laterality: N/A;   PERIPHERAL VASCULAR BALLOON  ANGIOPLASTY  01/08/2018   Procedure: PERIPHERAL VASCULAR BALLOON ANGIOPLASTY;  Surgeon: [name], MD;  Location: [place] [facility];  Service: Cardiovascular;;  innominate vein   THROMBECTOMY W/ EMBOLECTOMY Right 11/16/2016   Procedure: THROMBECTOMY REVISION OF ARTERIOVENOUS FISTULA - RIGHT ARM;  Surgeon: [name], MD;  Location: [place];  Service: Vascular;  Laterality: Right;   TUBAL LIGATION  2014    ROS: Review of Systems  Constitutional:  Negative for activity change and appetite change.  HENT:  Negative for dental problem and sore throat.   Eyes:  Negative for visual disturbance.  Respiratory:  Negative for cough.   Cardiovascular:  Negative for chest pain.  Gastrointestinal:  Negative for abdominal distention.  Endocrine:       Compliant with taking levothyroxine for hypothyroidism.  She is on 75 mcg.  TSH done through transplant clinic recently was normal.  Genitourinary:        Missed appointment with her gynecologist on the 19th of last month for Depo-Provera shot.  She is on the shot for abnormal uterine bleeding.  Reports that bleeding has stopped since being on the Depo.  She has had tubal ligation in the past.  She is agreeable to receiving a Depo shot today with [place] since she missed her shot last month.     PHYSICAL EXAM: BP 118/70   Pulse 73   Temp 98.1 F (36.7 C) (Oral)   Ht 4\' 9"  (1.448 m)   Wt 107 lb (48.5 kg)   SpO2 99%   BMI 23.15 kg/m   Waist circumference: 34 and half inches Waist to hip length 6 inches  Physical Exam  General appearance - alert, well appearing, and in no distress Mental status - normal mood, behavior, speech, dress, motor activity, and thought processes Eyes - pupils equal and reactive, extraocular eye movements intact Ears - bilateral TM's and external ear canals normal Nose - normal and patent, no erythema, discharge or polyps Mouth - mucous membranes moist, pharynx normal without lesions Neck - supple, no  significant adenopathy Lymphatics - no palpable lymphadenopathy, no hepatosplenomegaly Chest - clear to auscultation, no wheezes, rales or rhonchi, symmetric air entry Heart -regular rate and rhythm, soft systolic murmur throughout  Abdomen -normal bowel sounds, nondistended, soft.  She has scar in the right lower  quadrant of the abdomen from transplant surgery Pelvic: Deferred.  Patient has appointment with gynecology later this month. Extremities -no lower extremity edema.  HD graft in the right upper extremity without thrill.  Radial pulse 3+ on the left side, 1+ on the right      Latest Ref Rng & Units 03/21/2022    3:29 PM 01/11/2022    9:16 PM 01/11/2022    5:29 PM  CMP  Glucose 70 - 99 mg/dL  83  101   BUN 6 - 20 mg/dL  46  47   Creatinine 0.44 - 1.00 mg/dL  9.30  8.36   Sodium 135 - 145 mmol/L  137  137   Potassium 3.5 - 5.1 mmol/L  4.5  4.6   Chloride 98 - 111 mmol/L  96  94   CO2 22 - 32 mmol/L   28   Calcium 8.7 - 10.2 mg/dL 9.6   7.1    Lipid Panel     Component Value Date/Time   CHOL 82 11/17/2016 0517   TRIG 71 11/17/2016 0517   HDL 23 (L) 11/17/2016 0517   CHOLHDL 3.6 11/17/2016 0517   VLDL 14 11/17/2016 0517   LDLCALC 45 11/17/2016 0517    CBC    Component Value Date/Time   WBC 5.5 03/21/2022 1529   WBC 9.5 01/11/2022 1729   RBC 4.30 03/21/2022 1529   RBC 3.78 (L) 01/11/2022 1729   HGB 13.1 03/21/2022 1529   HCT 38.4 03/21/2022 1529   PLT 214 03/21/2022 1529   MCV 89 03/21/2022 1529   MCH 30.5 03/21/2022 1529   MCH 30.2 01/11/2022 1729   MCHC 34.1 03/21/2022 1529   MCHC 32.9 01/11/2022 1729   RDW 14.4 03/21/2022 1529   LYMPHSABS 1.6 03/21/2022 1529   MONOABS 0.9 01/11/2022 1729   EOSABS 0.1 03/21/2022 1529   BASOSABS 0.0 03/21/2022 1529    ASSESSMENT AND PLAN: 1. Annual physical exam Discussed on encourage healthy eating habits.  Encouraged her to move as much as she can. - Lipid panel  2. Acquired hypothyroidism Continue levothyroxine.  TSH  done last month was 2.85.  3. Kidney transplant recipient Followed by [facility].  Recent labs reviewed.  CBC done 08/21/2022 was normal.  Creatinine was 0.76 and GFR was greater than 90.  4. History of dysfunctional uterine bleeding Given Depo-Provera shot today.  5. Influenza vaccination declined Recommended.  Patient declined  6. Encounter for Depo-Provera contraception - medroxyPROGESTERone (DEPO-PROVERA) injection 150 mg    [employer] Language interpreter used during this encounter. #[id number], Randalyn Rhea  Patient was given the opportunity to ask questions.  Patient verbalized understanding of the plan and was able to repeat key elements of the plan.   This documentation was completed using Radio producer.  Any transcriptional errors are unintentional.  Orders Placed This Encounter  Procedures   Lipid panel     Requested Prescriptions    No prescriptions requested or ordered in this encounter    No follow-ups on file.  Karle Plumber, MD, FACP

## 2022-08-31 LAB — LIPID PANEL
Chol/HDL Ratio: 3.3 ratio (ref 0.0–4.4)
Cholesterol, Total: 163 mg/dL (ref 100–199)
HDL: 49 mg/dL
LDL Chol Calc (NIH): 79 mg/dL (ref 0–99)
Triglycerides: 212 mg/dL — ABNORMAL HIGH (ref 0–149)
VLDL Cholesterol Cal: 35 mg/dL (ref 5–40)

## 2022-09-19 ENCOUNTER — Ambulatory Visit: Payer: BC Managed Care – PPO | Admitting: Obstetrics and Gynecology

## 2022-09-25 ENCOUNTER — Ambulatory Visit
Admission: RE | Admit: 2022-09-25 | Discharge: 2022-09-25 | Disposition: A | Discharge status: Home / Self Care | Payer: BC Managed Care – PPO | Source: Ambulatory Visit | Attending: Obstetrics and Gynecology | Admitting: Obstetrics and Gynecology

## 2022-09-25 DIAGNOSIS — N939 Abnormal uterine and vaginal bleeding, unspecified: Secondary | ICD-10-CM | POA: Diagnosis present

## 2022-10-05 DIAGNOSIS — Z7901 Long term (current) use of anticoagulants: Secondary | ICD-10-CM | POA: Insufficient documentation

## 2022-10-24 ENCOUNTER — Ambulatory Visit (INDEPENDENT_AMBULATORY_CARE_PROVIDER_SITE_OTHER): Payer: BC Managed Care – PPO | Admitting: Obstetrics and Gynecology

## 2022-10-24 ENCOUNTER — Encounter: Payer: Self-pay | Admitting: Obstetrics and Gynecology

## 2022-10-24 VITALS — BP 123/85 | HR 98

## 2022-10-24 DIAGNOSIS — M62838 Other muscle spasm: Secondary | ICD-10-CM | POA: Diagnosis not present

## 2022-10-24 DIAGNOSIS — R102 Pelvic and perineal pain: Secondary | ICD-10-CM

## 2022-10-24 NOTE — Progress Notes (Signed)
[facility] Return Visit  SUBJECTIVE  History of Present Illness: Karla Garner is a 37 y.o. female seen in follow-up for pelvic pain and pelvic floor muscle spasm.   She has not had any pelvic pain or muscle spasm symptoms recently. Has been careful not to take any medications due to recent kidney transplant 5 months ago. Still has heaviness in her pelvis with long periods of walking. Went to one PT appt but was unable to keep future appts due to transplant appts.   She has gotten Depo provera injection for her AUB. Also had previously done a pelvic US which returned normal.   Past Medical History: Patient  has a past medical history of Anemia, Depression, Dysuria (11/04/2018), ESRD (end stage renal disease) ([place]), Glomerulonephritis, Hemodialysis patient ([place]), Hypertension, Menorrhagia with regular cycle (11/04/2018), Normocytic anemia (08/02/2015), PONV (postoperative nausea and vomiting), Swelling of right side of face (11/01/2017), and Thrombocytopenia ([place]).   Past Surgical History: She  has a past surgical history that includes Cesarean section; Tubal ligation (2014); AV fistula placement (Left); Ligation of arteriovenous  fistula (Left, 11/05/2014); AV fistula placement (Right, 04/14/2015); Exchange of a dialysis catheter (Right, 04/14/2015); Thrombectomy w/ embolectomy (Right, 11/16/2016); AV fistula placement (Right, 11/22/2016); Insertion of dialysis catheter (Left, 11/22/2016); Esophagogastroduodenoscopy (N/A, 11/24/2016); IR Removal Tun Cv Cath W/O FL (03/06/2017); IR DIALY SHUNT INTRO NEEDLE/INTRACATH INITIAL W/IMG RIGHT (Right, 07/26/2017); IR DIALY SHUNT INTRO NEEDLE/INTRACATH INITIAL W/IMG RIGHT (Right, 10/18/2017); A/V Fistulagram (N/A, 01/08/2018); PERIPHERAL VASCULAR BALLOON ANGIOPLASTY (01/08/2018); IR DIALY SHUNT INTRO NEEDLE/INTRACATH INITIAL W/IMG RIGHT (Right, 03/10/2018); IR DIALY SHUNT INTRO NEEDLE/INTRACATH INITIAL W/IMG RIGHT (Right, 12/30/2018); IR US Guide Vasc  Access Right (12/30/2018); IR TRANSCATH PLC STENT 1ST ART NOT LE CV CAR VERT CAR (12/30/2018); IR US Guide Vasc Access Right (12/30/2018); IR Veno/Ext/Uni Left (12/30/2018); IR Radiologist Eval & Mgmt (07/02/2019); IR US Guide Vasc Access Right (02/15/2020); IR AV DIALY SHUNT INTRO NEEDLE/INTRAC INITIAL W/PTA/STENT/IMG RIGHT (Right, 02/15/2020); IR PTA ADDL CENTRAL DIALYSIS SEG THRU DIALY CIRCUIT LEFT (Left, 10/18/2020); IR AV DIALY SHUNT INTRO NEEDLE/INTRACATH INITIAL W/PTA/IMG LEFT (10/18/2020); and Parathyroidectomy (N/A, 11/09/2021).   Medications: She has a current medication list which includes the following prescription(s): levothyroxine, megestrol, mycophenolate, omeprazole, prednisone, tacrolimus, valganciclovir, aspirin, b-complex/b-12, calcium acetate (phos binder), d 1000, magnesium-oxide, multivitamin with minerals, and sulfamethoxazole-trimethoprim.   Allergies: Patient has No Known Allergies.   Social History: Patient  reports that she has never smoked. She has never used smokeless tobacco. She reports that she does not drink alcohol and does not use drugs.      OBJECTIVE     Physical Exam: Vitals:   10/24/22 1615  BP: 123/85  Pulse: 98   Gen: No apparent distress, A&O x 3.  Detailed Urogynecologic Evaluation:  Deferred.    ASSESSMENT AND PLAN    [name] is a 37 y.o. with:  1. Levator spasm   2. Pelvic pain     Levator spasm - She would like to try again with pelvic PT once her transplant appts have decreased. Referral was placed and she was given the phone number to set up appts.   2. General GYN / AUB - needs to follow up with general GYN for repeat depo injection and pap smear  Return as needed  [name], MD  Time spent: I spent 17 minutes dedicated to the care of this patient on the date of this encounter to include pre-visit review of records, face-to-face time with the patient and post visit documentation  and ordering medication/  testing.

## 2022-12-10 ENCOUNTER — Encounter: Payer: Self-pay | Admitting: *Deleted

## 2022-12-18 ENCOUNTER — Ambulatory Visit: Payer: BC Managed Care – PPO | Admitting: Obstetrics and Gynecology

## 2022-12-18 ENCOUNTER — Other Ambulatory Visit (HOSPITAL_COMMUNITY)
Admission: RE | Admit: 2022-12-18 | Discharge: 2022-12-18 | Disposition: A | Discharge status: Home / Self Care | Payer: BC Managed Care – PPO | Source: Ambulatory Visit | Attending: Obstetrics and Gynecology | Admitting: Obstetrics and Gynecology

## 2022-12-18 ENCOUNTER — Other Ambulatory Visit: Payer: Self-pay

## 2022-12-18 ENCOUNTER — Encounter: Payer: Self-pay | Admitting: Obstetrics and Gynecology

## 2022-12-18 VITALS — BP 127/84 | HR 101 | Ht 59.0 in | Wt 108.4 lb

## 2022-12-18 DIAGNOSIS — Z124 Encounter for screening for malignant neoplasm of cervix: Secondary | ICD-10-CM | POA: Diagnosis present

## 2022-12-18 DIAGNOSIS — N939 Abnormal uterine and vaginal bleeding, unspecified: Secondary | ICD-10-CM

## 2022-12-18 DIAGNOSIS — Z3042 Encounter for surveillance of injectable contraceptive: Secondary | ICD-10-CM

## 2022-12-18 DIAGNOSIS — Z789 Other specified health status: Secondary | ICD-10-CM

## 2022-12-18 DIAGNOSIS — N941 Unspecified dyspareunia: Secondary | ICD-10-CM | POA: Diagnosis not present

## 2022-12-18 DIAGNOSIS — R87612 Low grade squamous intraepithelial lesion on cytologic smear of cervix (LGSIL): Secondary | ICD-10-CM | POA: Diagnosis not present

## 2022-12-18 MED ORDER — MEDROXYPROGESTERONE ACETATE 150 MG/ML IM SUSP
150.0000 mg | Freq: Once | INTRAMUSCULAR | Status: AC
  Administered 2022-12-18: 150 mg via INTRAMUSCULAR

## 2022-12-18 NOTE — Progress Notes (Signed)
ANNUAL EXAM Patient name: Karla Garner MRN [id number]  Date of birth: [birth year] Chief Complaint:   Gynecologic Exam  History of Present Illness:   Karla Garner is a 37 y.o. [phone] being seen today for a routine annual exam.  Current complaints: would like to resume depo  Menstrual concerns? Yes  - started on depo for menstrual suppression, felt was well controlled/suppressed with depo provera and would like to continue  Contraception use? Yes - s/p tubal ligation Sexually active? Yes - has pain with intercourse, not new. Previously referred to PFPT but delayed due to needing kidney transplant  No LMP recorded. Patient has had an injection.   The pregnancy intention screening data noted above was reviewed. Potential methods of contraception were discussed. The patient elected to proceed with No data recorded.   Last pap     Component Value Date/Time   DIAGPAP - Low grade squamous intraepithelial lesion (LSIL) (A) 10/17/2021 1137   DIAGPAP  11/19/2017 0000    NEGATIVE FOR INTRAEPITHELIAL LESIONS OR MALIGNANCY.   [place] Negative 10/17/2021 1137   ADEQPAP  10/17/2021 1137    Satisfactory for evaluation; transformation zone component PRESENT.   ADEQPAP  11/19/2017 0000    Satisfactory for evaluation  endocervical/transformation zone component PRESENT.     Last mammogram: n/a Last colonoscopy: n/a     08/30/2022   10:22 AM 03/21/2022    3:16 PM 12/12/2021    1:46 PM 10/17/2021   10:18 AM 12/03/2018    4:08 PM  Depression screen PHQ 2/9  Decreased Interest 0 3 0 1 3  Down, Depressed, Hopeless 0 2 0 1 1  PHQ - 2 Score 0 5 0 2 4  Altered sleeping 0 '2  1 3  '$ Tired, decreased energy 0 '2  1 3  '$ Change in appetite 0 '3  1 3  '$ Feeling bad or failure about yourself  0 0  0 3  Trouble concentrating '1 1  1 3  '$ Moving slowly or fidgety/restless 0 1  0 2  Suicidal thoughts 0 0  0 0  PHQ-9 Score '1 14  6 21        '$ 08/30/2022   10:22 AM 03/21/2022    3:16 PM  12/12/2021    1:46 PM 12/03/2018    4:08 PM  GAD 7 : Generalized Anxiety Score  Nervous, Anxious, on Edge '1 3 1 3  '$ Control/stop worrying 0 2 0 1  Worry too much - different things 0 '3 1 2  '$ Trouble relaxing '1 3 1 2  '$ Restless 0  0 1  Easily annoyed or irritable 0  0 2  Afraid - awful might happen 0  0 2  Total GAD 7 Score '2  3 13     '$ Review of Systems:   Pertinent items are noted in HPI Denies any headaches, blurred vision, fatigue, shortness of breath, chest pain, abdominal pain, abnormal vaginal discharge/itching/odor/irritation, problems with periods, bowel movements, urination, or intercourse unless otherwise stated above. Pertinent History Reviewed:  Reviewed past medical,surgical, social and family history.  Reviewed problem list, medications and allergies. Physical Assessment:   Vitals:   12/18/22 1612  BP: 127/84  Pulse: (!) 101  Weight: 108 lb 6.4 oz (49.2 kg)  Height: '4\' 11"'$  (1.499 m)  Body mass index is 21.89 kg/m.        Physical Examination:   General appearance - well appearing, and in no distress  Mental status - alert, oriented to person, place, and time  Psych:  She has a normal mood and affect  Skin - warm and dry, normal color, no suspicious lesions noted  Chest - effort normal, all lung fields clear to auscultation bilaterally  Heart - normal rate and regular rhythm  Pelvic -  VULVA: normal appearing vulva with no masses, tenderness or lesions   VAGINA: normal appearing vagina with normal color and discharge, no lesions   CERVIX: normal appearing cervix without discharge or lesions, no CMT  Thin prep pap is done with HR HPV cotesting  Extremities:  No swelling or varicosities noted  Chaperone present for exam  No results found for this or any previous visit (from the past 24 hour(s)).    Assessment & Plan:  1. Screening for cervical cancer - Cytology - PAP  2. Abnormal uterine bleeding (AUB) Resume depo provera today   3. Low grade squamous  intraepithelial lesion on cytologic smear of cervix (LGSIL) Recommend repeat PAP collected today  4. Dyspareunia in female Encouraged to start PFPT now that transplant has been completed  5. Language barrier In person spanish interpreter used   No orders of the defined types were placed in this encounter.   Meds: No orders of the defined types were placed in this encounter.   Follow-up: No follow-ups on file.  Darliss Cheney, MD 12/18/2022 4:42 PM

## 2022-12-18 NOTE — Progress Notes (Signed)
Karla Garner here for Depo-Provera  Injection.  Injection administered without complication. Patient will return in 3 months for next injection.  Bethanne Ginger, CMA 12/18/2022  4:13 PM

## 2022-12-24 LAB — CYTOLOGY - PAP
Comment: NEGATIVE
Diagnosis: UNDETERMINED — AB
High risk HPV: NEGATIVE

## 2022-12-26 ENCOUNTER — Telehealth: Payer: Self-pay | Admitting: Lactation Services

## 2022-12-26 NOTE — Telephone Encounter (Signed)
-----   Message from Darliss Cheney, MD sent at 12/26/2022  1:25 PM EST ----- pap showed slight change in cells (not pre-cancer or cancer) and negative HPV. Recommend repeat pap in 1 year.

## 2022-12-26 NOTE — Telephone Encounter (Signed)
Called and spoke with patient with assistance of [employer], Administrator, sports.   Patient informed of Pap Smear Results and recommendation to repeat in 1 year. Reviewed to call the office in December 2024 to schedule a follow up Pap Smear. Patient voiced understanding with no questions or concerns at this time.

## 2023-02-28 ENCOUNTER — Ambulatory Visit: Payer: BC Managed Care – PPO | Admitting: Internal Medicine

## 2023-05-13 ENCOUNTER — Ambulatory Visit (INDEPENDENT_AMBULATORY_CARE_PROVIDER_SITE_OTHER): Payer: BC Managed Care – PPO | Admitting: *Deleted

## 2023-05-13 ENCOUNTER — Other Ambulatory Visit: Payer: Self-pay

## 2023-05-13 VITALS — BP 113/77 | HR 84 | Ht <= 58 in | Wt 110.2 lb

## 2023-05-13 DIAGNOSIS — N939 Abnormal uterine and vaginal bleeding, unspecified: Secondary | ICD-10-CM

## 2023-05-13 NOTE — Progress Notes (Addendum)
Here for depo-provera for bleeding issues. Per chart review has been [redacted]w[redacted]d since last injection. She reports unprotected intercourse 3-4 days ago. She reports BTL in past and note in chart by previous doctor.  Also hx Kidney transplant. Per protocol cannot get injection, must abstain from intercourse or use condoms and come back in 2 weeks. Discussed with Dr. Alysia Penna and advised follow protocol. I explained to patient we cannot give if any chance of pregnancy and BTL is not 100% prevent pregnancy. She voices understanding. Nancy Fetter

## 2023-05-27 ENCOUNTER — Other Ambulatory Visit: Payer: Self-pay

## 2023-05-27 ENCOUNTER — Ambulatory Visit (INDEPENDENT_AMBULATORY_CARE_PROVIDER_SITE_OTHER): Payer: BC Managed Care – PPO

## 2023-05-27 VITALS — BP 117/77 | HR 85 | Wt 109.0 lb

## 2023-05-27 DIAGNOSIS — N939 Abnormal uterine and vaginal bleeding, unspecified: Secondary | ICD-10-CM

## 2023-05-27 DIAGNOSIS — Z3042 Encounter for surveillance of injectable contraceptive: Secondary | ICD-10-CM

## 2023-05-27 LAB — POCT PREGNANCY, URINE: Preg Test, Ur: NEGATIVE

## 2023-05-27 MED ORDER — MEDROXYPROGESTERONE ACETATE 150 MG/ML IM SUSY
150.0000 mg | PREFILLED_SYRINGE | Freq: Once | INTRAMUSCULAR | Status: AC
Start: 2023-05-27 — End: 2023-05-27
  Administered 2023-05-27: 150 mg via INTRAMUSCULAR

## 2023-05-27 NOTE — Progress Notes (Signed)
Sabra Heck here to restart Depo-Provera Injection for management of abnormal uterine bleeding (last given 12/18/22). Seen on 05/13/23 to restart depo but reported recent unprotected intercourse to RN. Ervin MD recommended pt avoid unprotected intercourse x 2 weeks and restart if UPT is negative at that time. Pt denies any unprotected intercourse in past 2 weeks. UPT today is negative. Pt also has hx of BTL. Injection administered without complication. Patient will return in 3 months for next injection between 08/12/23  and 08/26/23. Next annual visit due February 2025. Spanish interpreter Debarah Crape present for visit today.  Marjo Bicker, RN 05/27/2023  3:32 PM

## 2023-08-12 ENCOUNTER — Other Ambulatory Visit: Payer: Self-pay

## 2023-08-12 ENCOUNTER — Ambulatory Visit (INDEPENDENT_AMBULATORY_CARE_PROVIDER_SITE_OTHER): Payer: BC Managed Care – PPO | Admitting: *Deleted

## 2023-08-12 VITALS — BP 123/76 | HR 83 | Ht <= 58 in | Wt 111.1 lb

## 2023-08-12 DIAGNOSIS — Z3042 Encounter for surveillance of injectable contraceptive: Secondary | ICD-10-CM | POA: Diagnosis not present

## 2023-08-12 MED ORDER — MEDROXYPROGESTERONE ACETATE 150 MG/ML IM SUSY
150.0000 mg | PREFILLED_SYRINGE | Freq: Once | INTRAMUSCULAR | Status: AC
Start: 2023-08-12 — End: 2023-08-12
  Administered 2023-08-12: 150 mg via INTRAMUSCULAR

## 2023-08-12 NOTE — Progress Notes (Signed)
Here for depo-provera injection. Last given 05/27/23. Annual due 11/2023. Injection given without complaint. She reports no periods with occasional spotting which we discussed is normal with depo-provera. Sent to desk to schedule next injection. Nancy Fetter

## 2023-09-05 ENCOUNTER — Ambulatory Visit: Payer: BC Managed Care – PPO | Attending: Internal Medicine | Admitting: Internal Medicine

## 2023-09-05 VITALS — BP 111/75 | HR 87 | Temp 97.8°F | Ht <= 58 in | Wt 112.0 lb

## 2023-09-05 DIAGNOSIS — R7303 Prediabetes: Secondary | ICD-10-CM | POA: Diagnosis not present

## 2023-09-05 DIAGNOSIS — Z23 Encounter for immunization: Secondary | ICD-10-CM

## 2023-09-05 DIAGNOSIS — Z0000 Encounter for general adult medical examination without abnormal findings: Secondary | ICD-10-CM

## 2023-09-05 DIAGNOSIS — H9201 Otalgia, right ear: Secondary | ICD-10-CM

## 2023-09-05 DIAGNOSIS — E039 Hypothyroidism, unspecified: Secondary | ICD-10-CM

## 2023-09-05 MED ORDER — DOXYCYCLINE HYCLATE 100 MG PO TABS: 100.0000 mg | ORAL_TABLET | Freq: Two times a day (BID) | ORAL | 0 refills | Status: DC

## 2023-09-05 NOTE — Progress Notes (Incomplete)
Patient ID: Karla Garner, female    DOB: Jan 30, 1986  MRN: [id number]  CC: Hypertension (HTN f/u./Requesting to change appt today to a CPE/Yes to flu vax)   Subjective: Karla Garner is a 37 y.o. female who presents for annual exam.  Husband is with her. Her concerns today include:  Patient with prior hx of history of ESRD and was on HD but had cadaver kidney transplant 05/2022 through [facility], HTN, secondary hyperparathyroidism status post total parathyroidectomy with autotransplant brachioradialis muscle, ACD, hypothyroid   AMN Language interpreter used during this encounter. #[id number] Karla Garner  Here for PV exam Not on any BP med since transplant.   Seen at transplant clinic at [facility] 06/2023.  Still doing well.  GFR >90, Creat 0.83.  Last A1C 04/2023 5.7 in preDM range.  BS on labs done 08/02/2023 was 80.  Reports doing better with eating habits.  Walks daily.   Hx of hypothyroid.  Still on Levothyroxine 75 mcg daily.  Last TSH was 2.7 02/2023.  In regards to PTH, last Ca level nl HM:  yes to flu vaccine.  Last COVID vaccine she reports was last yr prior to kidney transplant.  Due for hep C screening; defers getting it done Patient Active Problem List   Diagnosis Date Noted  . Low grade squamous intraepithelial lesion on cytologic smear of cervix (LGSIL) 04/18/2022  . Secondary hyperparathyroidism of renal origin (HCC) 11/09/2021  . Hyperparathyroidism, secondary (HCC) 11/05/2021  . Cervical cancer screening 10/17/2021  . Routine screening for STI (sexually transmitted infection) 10/17/2021  . Uterine prolapse 10/17/2021  . Dyspareunia in female 10/17/2021  . Anxiety state 06/05/2018  . Healthcare maintenance 11/21/2017  . 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 Depression 11/01/2017  . Acid reflux 11/01/2017  . ESRD on hemodialysis (HCC)   . Anemia of chronic kidney failure 04/09/2015  . Hypertension 04/30/2013  . 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 aspirin 81 MG chewable tablet Chew 81 mg by mouth 3 (three) times a week. 3 times each week    . Cholecalciferol (D 1000) 25 MCG (1000 UT) capsule Take by mouth.    . levothyroxine (SYNTHROID) 75 MCG tablet Take by mouth.    Marland Kitchen MAGNESIUM-OXIDE 400 (240 Mg) MG tablet Take 1 tablet by mouth daily.    . Multiple Vitamins-Minerals (MULTIVITAMIN WOMEN PO) Take by mouth.    . mycophenolate (MYFORTIC) 180 MG EC tablet Take by mouth.    Marland Kitchen omeprazole (PRILOSEC) 20 MG capsule Take 20 mg by mouth daily. 1 tab daily    . predniSONE (DELTASONE) 5 MG tablet Take 1 tablet by mouth daily.    . tacrolimus (PROGRAF) 1 MG capsule Take 5mg  in the AM and 5mg  in the PM     No current facility-administered medications on file prior to visit.    No Known Allergies  Social History   Socioeconomic History  . Marital status: Married    Spouse name: Not on file  . Number of children: 3  . Years of education: Not on file  . Highest education level: 4th grade  Occupational History  . Occupation: unemployed  Tobacco Use  . Smoking status: Never  . Smokeless tobacco: Never  Vaping Use  . Vaping status: Never Used  Substance and Sexual Activity  . Alcohol use: No    Alcohol/week: 0.0 standard drinks of alcohol  . Drug  use: No  . Sexual activity: Not Currently    Partners: Male    Birth control/protection: Surgical  Other Topics Concern  . Not on file  Social History Narrative   As of February 2018 she has 3 children with whom she lives they are aged 16, 10 and 2.    Social Determinants of Health   Financial Resource Strain: Medium Risk (09/05/2023)   Overall Financial Resource Strain (CARDIA)   . Difficulty of Paying Living Expenses: Somewhat hard  Food Insecurity: Unknown (09/05/2023)   Hunger Vital Sign   . Worried About Programme researcher, broadcasting/film/video in the Last Year: Never true   . Ran Out of Food in the Last  Year: Patient declined  Transportation Needs: No Transportation Needs (09/05/2023)   PRAPARE - Transportation   . Lack of Transportation (Medical): No   . Lack of Transportation (Non-Medical): No  Physical Activity: Sufficiently Active (09/05/2023)   Exercise Vital Sign   . Days of Exercise per Week: 7 days   . Minutes of Exercise per Session: 30 min  Stress: No Stress Concern Present (09/05/2023)   [employer] of Occupational Health - Occupational Stress Questionnaire   . Feeling of Stress : Only a little  Social Connections: Unknown (09/05/2023)   Social Connection and Isolation Panel [NHANES]   . Frequency of Communication with Friends and Family: Patient declined   . Frequency of Social Gatherings with Friends and Family: More than three times a week   . Attends Religious Services: Patient declined   . Active Member of Clubs or Organizations: No   . Attends Banker Meetings: Not on file   . Marital Status: Married  Catering manager Violence: Not on file    Family History  Problem Relation Age of Onset  . Other Mother        no medical problems per patient  . Other Father        no medical problems per patient  . Colon cancer Neg Hx   . Liver cancer Neg Hx   . Stomach cancer Neg Hx     Past Surgical History:  Procedure Laterality Date  . A/V FISTULAGRAM N/A 01/08/2018   Procedure: A/V FISTULAGRAM - right arm;  Surgeon: Maeola Harman, MD;  Location: [facility] INVASIVE CV LAB;  Service: Cardiovascular;  Laterality: N/A;  . AV FISTULA PLACEMENT Left   . AV FISTULA PLACEMENT Right 04/14/2015   Procedure: CREATION OF RIGHT RADIOCEPHALIC ARTERIOVENOUS (AV) FISTULA ;  Surgeon: Chuck Hint, MD;  Location: [facility] OR;  Service: Vascular;  Laterality: Right;  . AV FISTULA PLACEMENT Right 11/22/2016   Procedure: ARTERIOVENOUS (AV) FISTULA CREATION;  Surgeon: Maeola Harman, MD;  Location: [facility] OR;  Service: Vascular;  Laterality: Right;  . CESAREAN  SECTION     2009  . ESOPHAGOGASTRODUODENOSCOPY N/A 11/24/2016   Procedure: ESOPHAGOGASTRODUODENOSCOPY (EGD);  Surgeon: Meryl Dare, MD;  Location: [facility] ENDOSCOPY;  Service: Endoscopy;  Laterality: N/A;  . EXCHANGE OF A DIALYSIS CATHETER Right 04/14/2015   Procedure: EXCHANGE OF RIGHT INTERNAL JUGULAR DIALYSIS CATHETER;  Surgeon: Chuck Hint, MD;  Location: [facility] OR;  Service: Vascular;  Laterality: Right;  . INSERTION OF DIALYSIS CATHETER Left 11/22/2016   Procedure: INSERTION OF DIALYSIS CATHETER - LEFT INTERNAL JUGULAR PLACEMENT;  Surgeon: Maeola Harman, MD;  Location: [facility] OR;  Service: Vascular;  Laterality: Left;  . IR AV DIALY SHUNT INTRO NEEDLE/INTRAC INITIAL W/PTA/STENT/IMG RT Right 02/15/2020  . IR AV DIALY SHUNT INTRO NEEDLE/INTRACATH  INITIAL W/PTA/IMG LEFT  10/18/2020  . IR DIALY SHUNT INTRO NEEDLE/INTRACATH INITIAL W/IMG RIGHT Right 07/26/2017  . IR DIALY SHUNT INTRO NEEDLE/INTRACATH INITIAL W/IMG RIGHT Right 10/18/2017  . IR DIALY SHUNT INTRO NEEDLE/INTRACATH INITIAL W/IMG RIGHT Right 03/10/2018  . IR DIALY SHUNT INTRO NEEDLE/INTRACATH INITIAL W/IMG RIGHT Right 12/30/2018  . IR PTA ADDL CENTRAL DIALYSIS SEG THRU DIALY CIRCUIT LEFT Left 10/18/2020  . IR RADIOLOGIST EVAL & MGMT  07/02/2019  . IR REMOVAL TUN CV CATH W/O FL  03/06/2017  . IR TRANSCATH PLC STENT 1ST ART NOT LE CV CAR VERT CAR  12/30/2018  . IR US GUIDE VASC ACCESS RIGHT  12/30/2018  . IR US GUIDE VASC ACCESS RIGHT  12/30/2018  . IR US GUIDE VASC ACCESS RIGHT  02/15/2020  . IR VENO/EXT/UNI LEFT  12/30/2018  . LIGATION OF ARTERIOVENOUS  FISTULA Left 11/05/2014   Procedure: LIGATION OF LEFT ARM  BRACHIO-CEPHALIC ARTERIOVENOUS  FISTULA ,& REPAIR OF BRACHIAL ARTERY.;  Surgeon: Pryor Ochoa, MD;  Location: [facility] OR;  Service: Vascular;  Laterality: Left;  . PARATHYROIDECTOMY N/A 11/09/2021   Procedure: TOTAL PARATHYROIDECTOMY;  Surgeon: Darnell Level, MD;  Location: [facility] OR;  Service: General;  Laterality: N/A;  . PERIPHERAL  VASCULAR BALLOON ANGIOPLASTY  01/08/2018   Procedure: PERIPHERAL VASCULAR BALLOON ANGIOPLASTY;  Surgeon: Maeola Harman, MD;  Location: [facility] INVASIVE CV LAB;  Service: Cardiovascular;;  innominate vein  . THROMBECTOMY W/ EMBOLECTOMY Right 11/16/2016   Procedure: THROMBECTOMY REVISION OF ARTERIOVENOUS FISTULA - RIGHT ARM;  Surgeon: Larina Earthly, MD;  Location: [facility] OR;  Service: Vascular;  Laterality: Right;  . TUBAL LIGATION  2014    ROS: Review of Systems Negative except as stated above  PHYSICAL EXAM: BP 111/75 (BP Location: Left Arm, Patient Position: Sitting, Cuff Size: Normal)   Pulse 87   Temp 97.8 F (36.6 C) (Oral)   Ht 4\' 10"  (1.473 m)   Wt 112 lb (50.8 kg)   SpO2 99%   BMI 23.41 kg/m   Physical Exam  {female adult master:310786} {female adult master:310785}     Latest Ref Rng & Units 03/21/2022    3:29 PM 01/11/2022    9:16 PM 01/11/2022    5:29 PM  CMP  Glucose 70 - 99 mg/dL  83  244   BUN 6 - 20 mg/dL  46  47   Creatinine 0.10 - 1.00 mg/dL  2.72  5.36   Sodium 644 - 145 mmol/L  137  137   Potassium 3.5 - 5.1 mmol/L  4.5  4.6   Chloride 98 - 111 mmol/L  96  94   CO2 22 - 32 mmol/L   28   Calcium 8.7 - 10.2 mg/dL 9.6   7.1    Lipid Panel     Component Value Date/Time   CHOL 163 08/30/2022 1138   TRIG 212 (H) 08/30/2022 1138   HDL 49 08/30/2022 1138   CHOLHDL 3.3 08/30/2022 1138   CHOLHDL 3.6 11/17/2016 0517   VLDL 14 11/17/2016 0517   LDLCALC 79 08/30/2022 1138    CBC    Component Value Date/Time   WBC 5.5 03/21/2022 1529   WBC 9.5 01/11/2022 1729   RBC 4.30 03/21/2022 1529   RBC 3.78 (L) 01/11/2022 1729   HGB 13.1 03/21/2022 1529   HCT 38.4 03/21/2022 1529   PLT 214 03/21/2022 1529   MCV 89 03/21/2022 1529   MCH 30.5 03/21/2022 1529   MCH 30.2 01/11/2022 1729   MCHC 34.1 03/21/2022  1529   MCHC 32.9 01/11/2022 1729   RDW 14.4 03/21/2022 1529   LYMPHSABS 1.6 03/21/2022 1529   MONOABS 0.9 01/11/2022 1729   EOSABS 0.1 03/21/2022 1529    BASOSABS 0.0 03/21/2022 1529    ASSESSMENT AND PLAN:  Assessment and Plan              There are no diagnoses linked to this encounter.   Patient was given the opportunity to ask questions.  Patient verbalized understanding of the plan and was able to repeat key elements of the plan.   This documentation was completed using Paediatric nurse.  Any transcriptional errors are unintentional.  No orders of the defined types were placed in this encounter.    Requested Prescriptions    No prescriptions requested or ordered in this encounter    No follow-ups on file.  Jonah Blue, MD, FACP

## 2023-09-05 NOTE — Patient Instructions (Signed)
 Cuidados preventivos en las mujeres de 21 a 39 aos de edad Preventive Care 40-35 Years Old, Female Los cuidados preventivos hacen referencia a las opciones en cuanto al estilo de vida y a las visitas al mdico, las cuales pueden promover la salud y Counsellor. Las visitas de cuidado preventivo tambin se denominan exmenes de Health visitor. Qu puedo esperar para mi visita de cuidado preventivo? Asesoramiento Durante la visita de cuidado preventivo, el mdico puede preguntarle sobre lo siguiente: Antecedentes mdicos, incluidos los siguientes: Problemas mdicos pasados. Antecedentes mdicos familiares. Antecedentes de embarazo. Salud actual, incluido lo siguiente: Ciclo menstrual. Mtodos anticonceptivos. Su bienestar emocional. Training and development officer y las relaciones personales. Actividad sexual y salud sexual. Doran Clay de vida, incluido lo siguiente: Consumo de alcohol, nicotina, tabaco o drogas. Acceso a armas de fuego. Hbitos de alimentacin, ejercicio y sueo. Su trabajo y [place] laboral. Uso de pantalla solar. Cuestiones de seguridad, como el uso de cinturn de seguridad y casco de Scientist, research (physical sciences). Examen fsico El mdico puede controlar lo siguiente: Mirian Mo y [place]. Estos pueden usarse para calcular el IMC (ndice de masa corporal). El [facility] es una medicin que indica si tiene un peso saludable. Circunferencia de la cintura. Es Neomia Dear medicin alrededor de Lobbyist. Esta medicin tambin indica si tiene un peso saludable y puede ayudar a predecir su riesgo de padecer ciertas enfermedades, como diabetes tipo 2 y presin arterial alta. Frecuencia cardaca y presin arterial. Temperatura corporal. Piel para detectar manchas anormales. Qu vacunas necesito?  Las vacunas se aplican a varias edades, segn un cronograma. El [employer] recomendar vacunas segn su edad, sus antecedentes mdicos, su estilo de vida y [address], como los viajes o el lugar donde trabaja. Qu pruebas  necesito? Pruebas de deteccin El mdico puede recomendar pruebas de deteccin de ciertas afecciones. Esto puede incluir: Examen plvico y prueba de Papanicolaou. Niveles de lpidos y colesterol. Pruebas de deteccin de la diabetes. Esto se Physiological scientist un control del azcar en la sangre (glucosa) despus de no haber comido durante un periodo de tiempo (ayuno). Prueba de hepatitis B. Prueba de hepatitis C. Prueba del VIH (virus de inmunodeficiencia humana). Pruebas de infecciones de transmisin sexual (ITS), si est en riesgo. Pruebas de deteccin de cncer relacionado con las mutaciones del BRCA. Es posible que se las Tour manager si tiene antecedentes de cncer de mama, de ovario, de trompas o peritoneal. Hable con su mdico sobre los Butler de las pruebas, las opciones de tratamiento y, si corresponde, la necesidad de Education officer, environmental ms pruebas. Siga estas instrucciones en su casa: Comida y bebida  Siga una dieta saludable que incluya frutas y verduras frescas, cereales integrales, protenas magras y productos lcteos descremados. Tome los suplementos vitamnicos y [employer] se lo haya indicado el mdico. No beba alcohol si: Su mdico le indica no hacerlo. Est embarazada, puede estar embarazada o est tratando de [place]. Si bebe alcohol: Limite la cantidad que consume de 0 a 1 medida por da. Sepa cunta cantidad de alcohol hay en las bebidas que toma. En los [address], una medida equivale a una botella de cerveza de 12 oz (355 ml), un vaso de vino de 5 oz (148 ml) o un vaso de una bebida alcohlica de alta graduacin de 1 oz (44 ml). Estilo de [employer] dientes a la maana y a la noche con Conservator, museum/gallery con fluoruro. Use hilo dental una vez al da. Haga al menos 30 minutos de ejercicio, 5 o ms [address]. No  consuma ningn producto que contenga nicotina o tabaco. Estos productos incluyen cigarrillos, tabaco para Theatre manager y aparatos de vapeo, como  los Administrator, Civil Service. Si necesita ayuda para dejar de fumar, consulte al mdico. No consuma drogas. Si es sexualmente activa, practique sexo seguro. Use un condn u otra forma de proteccin para prevenir las infecciones de transmisin sexual (ITS). Si no desea quedar embarazada, use un mtodo anticonceptivo. Si busca un embarazo, realice una consulta previa al embarazo con el mdico. Busque maneras saludables de Charity fundraiser, tales como: Meditacin, yoga o Optometrist. Lleve un diario personal. Hable con una persona confiable. Pase tiempo con amigos y familiares. Minimice la exposicin a la radiacin UV para reducir el riesgo de cncer de piel. Seguridad [place] siempre el cinturn de seguridad al conducir o viajar en un vehculo. No conduzca: Si ha estado bebiendo alcohol. No viaje con un conductor que ha estado bebiendo. Si ha estado usando sustancias o drogas que alteran la funcin mental. Mientras est enviando mensajes de texto. Si est cansada o distrada. Use un casco y otros equipos de proteccin durante las actividades deportivas. Si tiene armas de fuego en su casa, asegrese de seguir todos los procedimientos de seguridad correspondientes. Busque ayuda si fue vctima de abuso fsico o abuso sexual. Cundo volver? Acuda al mdico una vez al ao para una visita anual de control de bienestar. Pregntele al mdico con qu frecuencia debe realizarse un control de la vista y los dientes. Mantenga su esquema de vacunacin al da. Esta informacin no tiene Theme park manager el consejo del mdico. Asegrese de hacerle al mdico cualquier pregunta que tenga. Document Revised: 04/26/2021 Document Reviewed: 04/26/2021 Elsevier Patient Education  2024 [employer].

## 2023-09-05 NOTE — Progress Notes (Signed)
Patient ID: Karla Garner, female    DOB: 11-Nov-1985  MRN: [id number]  CC: Hypertension (HTN f/u./Requesting to change appt today to a CPE/Yes to flu vax)   Subjective: Karla Garner is a 37 y.o. female who presents for annual exam.  Husband is with her. Her concerns today include:  Patient with prior hx of history of ESRD and was on HD but had cadaver kidney transplant 05/2022 through [facility], HTN, secondary hyperparathyroidism status post total parathyroidectomy with autotransplant brachioradialis muscle, ACD, hypothyroid   [employer] Language interpreter used during this encounter. #[id number] [name]  Here for preventative maintenance examination.  She is on her husband's insurance.  This is required to allow her to remain on his insurance. Not on any BP med since transplant.   Seen at transplant clinic at [facility] 06/2023.  Still doing well.  Still on prednisone 5 mg daily, Prograf and Myfortic.  GFR >90, Creat 0.83.  Last A1C 04/2023 5.7 in preDM range.  BS on labs done 08/02/2023 was 80.  Reports doing better with eating habits.  Walks daily.   Hx of hypothyroid.  Still on Levothyroxine 75 mcg daily.  Last TSH was 2.7 02/2023.  In regards to PTH, last Ca level nl  HM:  yes to flu vaccine.  Last COVID vaccine she reports was last yr prior to kidney transplant.  She declined having COVID booster today stating that she will talk with her transplant team first.  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 3 times each week     Cholecalciferol (D 1000) 25 MCG (1000 UT) capsule Take by mouth.     levothyroxine (SYNTHROID) 75 MCG tablet Take by mouth.     MAGNESIUM-OXIDE 400 (240 Mg) MG tablet Take 1 tablet by mouth daily.     Multiple Vitamins-Minerals (MULTIVITAMIN WOMEN PO) Take by mouth.     mycophenolate (MYFORTIC) 180 MG EC tablet Take by mouth.     omeprazole (PRILOSEC) 20 MG capsule Take 20 mg by mouth daily. 1 tab daily     predniSONE (DELTASONE) 5 MG tablet Take 1 tablet by mouth daily.     tacrolimus (PROGRAF) 1 MG capsule Take 5mg  in the AM and 5mg  in the PM     No current facility-administered medications on file prior to visit.    No Known Allergies  Social History   Socioeconomic History   Marital status: Married    Spouse name: Not on file   Number of children: 3   Years of education: Not on file   Highest education level: 4th grade  Occupational History   Occupation: unemployed  Tobacco Use   Smoking status: Never   Smokeless tobacco: Never  Vaping Use  Vaping status: Never Used  Substance and Sexual Activity   Alcohol use: No    Alcohol/week: 0.0 standard drinks of alcohol   Drug use: No   Sexual activity: Not Currently    Partners: Male    Birth control/protection: Surgical  Other Topics Concern   Not on file  Social History Narrative   As of February 2018 she has 3 children with whom she lives they are aged 56, 68 and 2.    Social Determinants of Health   Financial Resource Strain: Medium Risk (09/05/2023)   Overall Financial Resource Strain (CARDIA)    Difficulty of Paying Living Expenses: Somewhat hard  Food Insecurity: Unknown (09/05/2023)   Hunger Vital Sign     Worried About Running Out of Food in the Last Year: Never true    Ran Out of Food in the Last Year: Patient declined  Transportation Needs: No Transportation Needs (09/05/2023)   PRAPARE - Administrator, Civil Service (Medical): No    Lack of Transportation (Non-Medical): No  Physical Activity: Sufficiently Active (09/05/2023)   Exercise Vital Sign    Days of Exercise per Week: 7 days    Minutes of Exercise per Session: 30 min  Stress: No Stress Concern Present (09/05/2023)   [employer] of Occupational Health - Occupational Stress Questionnaire    Feeling of Stress : Only a little  Social Connections: Unknown (09/05/2023)   Social Connection and Isolation Panel [NHANES]    Frequency of Communication with Friends and Family: Patient declined    Frequency of Social Gatherings with Friends and Family: More than three times a week    Attends Religious Services: Patient declined    Database administrator or Organizations: No    Attends Engineer, structural: Not on file    Marital Status: Married  Catering manager Violence: Not on file    Family History  Problem Relation Age of Onset   Other Mother        no medical problems per patient   Other Father        no medical problems per patient   Colon cancer Neg Hx    Liver cancer Neg Hx    Stomach cancer Neg Hx     Past Surgical History:  Procedure Laterality Date   A/V FISTULAGRAM N/A 01/08/2018   Procedure: A/V FISTULAGRAM - right arm;  Surgeon: Maeola Harman, MD;  Location: [facility] INVASIVE CV LAB;  Service: Cardiovascular;  Laterality: N/A;   AV FISTULA PLACEMENT Left    AV FISTULA PLACEMENT Right 04/14/2015   Procedure: CREATION OF RIGHT RADIOCEPHALIC ARTERIOVENOUS (AV) FISTULA ;  Surgeon: Chuck Hint, MD;  Location: [facility] OR;  Service: Vascular;  Laterality: Right;   AV FISTULA PLACEMENT Right 11/22/2016   Procedure: ARTERIOVENOUS (AV) FISTULA CREATION;  Surgeon: Maeola Harman,  MD;  Location: [facility] OR;  Service: Vascular;  Laterality: Right;   CESAREAN SECTION     2009   ESOPHAGOGASTRODUODENOSCOPY N/A 11/24/2016   Procedure: ESOPHAGOGASTRODUODENOSCOPY (EGD);  Surgeon: Meryl Dare, MD;  Location: [facility] ENDOSCOPY;  Service: Endoscopy;  Laterality: N/A;   EXCHANGE OF A DIALYSIS CATHETER Right 04/14/2015   Procedure: EXCHANGE OF RIGHT INTERNAL JUGULAR DIALYSIS CATHETER;  Surgeon: Chuck Hint, MD;  Location: [facility] OR;  Service: Vascular;  Laterality: Right;   INSERTION OF DIALYSIS CATHETER Left 11/22/2016   Procedure: INSERTION OF DIALYSIS CATHETER - LEFT INTERNAL JUGULAR PLACEMENT;  Surgeon: Maeola Harman, MD;  Location: [facility] OR;  Service: Vascular;  Laterality: Left;   IR AV DIALY SHUNT INTRO NEEDLE/INTRAC INITIAL W/PTA/STENT/IMG RT Right 02/15/2020   IR AV DIALY SHUNT INTRO NEEDLE/INTRACATH INITIAL W/PTA/IMG LEFT  10/18/2020   IR DIALY SHUNT INTRO NEEDLE/INTRACATH INITIAL W/IMG RIGHT Right 07/26/2017   IR DIALY SHUNT INTRO NEEDLE/INTRACATH INITIAL W/IMG RIGHT Right 10/18/2017   IR DIALY SHUNT INTRO NEEDLE/INTRACATH INITIAL W/IMG RIGHT Right 03/10/2018   IR DIALY SHUNT INTRO NEEDLE/INTRACATH INITIAL W/IMG RIGHT Right 12/30/2018   IR PTA ADDL CENTRAL DIALYSIS SEG THRU DIALY CIRCUIT LEFT Left 10/18/2020   IR RADIOLOGIST EVAL & MGMT  07/02/2019   IR REMOVAL TUN CV CATH W/O FL  03/06/2017   IR TRANSCATH PLC STENT 1ST ART NOT LE CV CAR VERT CAR  12/30/2018   IR US GUIDE VASC ACCESS RIGHT  12/30/2018   IR US GUIDE VASC ACCESS RIGHT  12/30/2018   IR US GUIDE VASC ACCESS RIGHT  02/15/2020   IR VENO/EXT/UNI LEFT  12/30/2018   LIGATION OF ARTERIOVENOUS  FISTULA Left 11/05/2014   Procedure: LIGATION OF LEFT ARM  BRACHIO-CEPHALIC ARTERIOVENOUS  FISTULA ,& REPAIR OF BRACHIAL ARTERY.;  Surgeon: Pryor Ochoa, MD;  Location: [facility] OR;  Service: Vascular;  Laterality: Left;   PARATHYROIDECTOMY N/A 11/09/2021   Procedure: TOTAL PARATHYROIDECTOMY;  Surgeon: Darnell Level, MD;  Location: [facility]  OR;  Service: General;  Laterality: N/A;   PERIPHERAL VASCULAR BALLOON ANGIOPLASTY  01/08/2018   Procedure: PERIPHERAL VASCULAR BALLOON ANGIOPLASTY;  Surgeon: Maeola Harman, MD;  Location: [facility] INVASIVE CV LAB;  Service: Cardiovascular;;  innominate vein   THROMBECTOMY W/ EMBOLECTOMY Right 11/16/2016   Procedure: THROMBECTOMY REVISION OF ARTERIOVENOUS FISTULA - RIGHT ARM;  Surgeon: Larina Earthly, MD;  Location: [facility] OR;  Service: Vascular;  Laterality: Right;   TUBAL LIGATION  2014    ROS: HEENT: Complains of pain in the right ear that has been constant for 3 months.  Uses Q-tips in the ear.  No decreased hearing.   No problems swallowing. Eyes: No vision problems.  She has never had a formal eye exam. Chest: No chronic cough, no shortness of breath. CVS: No chest pains or palpitations. Abdomen: Moving bowels okay. MSK: No joint pains or swelling   PHYSICAL EXAM: BP 111/75 (BP Location: Left Arm, Patient Position: Sitting, Cuff Size: Normal)   Pulse 87   Temp 97.8 F (36.6 C) (Oral)   Ht 4\' 10"  (1.473 m)   Wt 112 lb (50.8 kg)   SpO2 99%   BMI 23.41 kg/m   Physical Exam  General appearance - alert, well appearing, and in no distress Mental status - normal mood, behavior, speech, dress, motor activity, and thought processes Eyes - pupils equal and reactive, extraocular eye movements intact Ears -right ear: She has small amount of wax obscuring view of the canal and tympanic membrane Nose - normal and patent, no erythema, discharge or polyps Mouth - mucous membranes moist, pharynx normal without lesions Neck - supple, no significant adenopathy Lymphatics - no palpable lymphadenopathy, no hepatosplenomegaly Chest - clear to auscultation, no wheezes, rales or rhonchi, symmetric air entry Heart - normal rate, regular rhythm, normal S1, S2, no murmurs, rubs, clicks or gallops Abdomen - soft, nontender, nondistended, no masses or organomegaly Neurological - cranial nerves II  through XII intact, motor and sensory grossly normal bilaterally Musculoskeletal - no joint tenderness, deformity or swelling Extremities - peripheral pulses normal, no pedal edema, no clubbing or cyanosis Skin - normal coloration and turgor, no rashes, no suspicious skin lesions  noted      Latest Ref Rng & Units 03/21/2022    3:29 PM 01/11/2022    9:16 PM 01/11/2022    5:29 PM  CMP  Glucose 70 - 99 mg/dL  83  213   BUN 6 - 20 mg/dL  46  47   Creatinine 0.86 - 1.00 mg/dL  5.78  4.69   Sodium 629 - 145 mmol/L  137  137   Potassium 3.5 - 5.1 mmol/L  4.5  4.6   Chloride 98 - 111 mmol/L  96  94   CO2 22 - 32 mmol/L   28   Calcium 8.7 - 10.2 mg/dL 9.6   7.1    Lipid Panel     Component Value Date/Time   CHOL 163 08/30/2022 1138   TRIG 212 (H) 08/30/2022 1138   HDL 49 08/30/2022 1138   CHOLHDL 3.3 08/30/2022 1138   CHOLHDL 3.6 11/17/2016 0517   VLDL 14 11/17/2016 0517   LDLCALC 79 08/30/2022 1138    CBC    Component Value Date/Time   WBC 5.5 03/21/2022 1529   WBC 9.5 01/11/2022 1729   RBC 4.30 03/21/2022 1529   RBC 3.78 (L) 01/11/2022 1729   HGB 13.1 03/21/2022 1529   HCT 38.4 03/21/2022 1529   PLT 214 03/21/2022 1529   MCV 89 03/21/2022 1529   MCH 30.5 03/21/2022 1529   MCH 30.2 01/11/2022 1729   MCHC 34.1 03/21/2022 1529   MCHC 32.9 01/11/2022 1729   RDW 14.4 03/21/2022 1529   LYMPHSABS 1.6 03/21/2022 1529   MONOABS 0.9 01/11/2022 1729   EOSABS 0.1 03/21/2022 1529   BASOSABS 0.0 03/21/2022 1529    ASSESSMENT AND PLAN: 1. Annual physical exam Encouraged her to get an eye exam at least once every 2 years. Encouraged her to get routine dental cleaning at least twice a year. Form completed documenting that she did have her preventative maintenance today. 2. Prediabetes Discussed and encourage healthy eating habits.  Continue regular exercise.  3. Acquired hypothyroidism Continue levothyroxine.  She declines TSH checked today.  4. Right ear pain Unable to see  the tympanic membrane due to wax.  Will treat with antibiotics for possible infection.  If pain does not resolve, she should let me know so that we can refer to ENT - doxycycline (VIBRA-TABS) 100 MG tablet; Take 1 tablet (100 mg total) by mouth 2 (two) times daily.  Dispense: 14 tablet; Refill: 0  5. Need for influenza vaccination Pt left before this was given    Patient was given the opportunity to ask questions.  Patient verbalized understanding of the plan and was able to repeat key elements of the plan.   This documentation was completed using Paediatric nurse.  Any transcriptional errors are unintentional.  No orders of the defined types were placed in this encounter.    Requested Prescriptions   Signed Prescriptions Disp Refills   doxycycline (VIBRA-TABS) 100 MG tablet 14 tablet 0    Sig: Take 1 tablet (100 mg total) by mouth 2 (two) times daily.    Return in about 6 months (around 03/04/2024).  Jonah Blue, MD, FACP

## 2023-09-10 ENCOUNTER — Other Ambulatory Visit: Payer: Self-pay | Admitting: Internal Medicine

## 2023-09-11 ENCOUNTER — Other Ambulatory Visit: Payer: Self-pay

## 2023-09-11 ENCOUNTER — Ambulatory Visit: Payer: BC Managed Care – PPO | Admitting: Vascular Surgery

## 2023-09-11 ENCOUNTER — Encounter: Payer: Self-pay | Admitting: Vascular Surgery

## 2023-09-11 ENCOUNTER — Other Ambulatory Visit (HOSPITAL_COMMUNITY): Payer: Self-pay

## 2023-09-11 VITALS — BP 122/81 | HR 88 | Temp 98.3°F | Resp 20 | Ht <= 58 in | Wt 111.0 lb

## 2023-09-11 DIAGNOSIS — I770 Arteriovenous fistula, acquired: Secondary | ICD-10-CM

## 2023-09-11 DIAGNOSIS — T829XXA Unspecified complication of cardiac and vascular prosthetic device, implant and graft, initial encounter: Secondary | ICD-10-CM

## 2023-09-11 DIAGNOSIS — Z940 Kidney transplant status: Secondary | ICD-10-CM

## 2023-09-11 MED ORDER — MAGNESIUM OXIDE 400 MG PO TABS
1.0000 | ORAL_TABLET | Freq: Every day | ORAL | 1 refills | Status: DC
  Filled 2023-09-11: qty 100, 100d supply, fill #0

## 2023-09-11 NOTE — H&P (View-Only) (Signed)
 Patient ID: Karla Garner, female   DOB: October 25, 1985, 37 y.o.   MRN: [id number]  Reason for Consult: Follow-up   Referred by Bufford Buttner, MD  Subjective:     HPI:  Karla Garner is a 37 y.o. 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  She is now status post renal transplant and has a functioning transplant without need for further dialysis.  She does have swelling of the upper arm with pain in the shoulder prominent veins on the chest and anterior shoulder.  She states that she has a very annoying sound that she can hear particularly when she is laying down that has been worsening with time.  She has not had any bleeding issues from the fistula in her kidney transplant has been functioning well.  She does not take blood thinners other than aspirin.  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   Glomerulonephritis    Hemodialysis patient [facility])    Hypertension    Menorrhagia with regular cycle 11/04/2018   Normocytic anemia 08/02/2015   PONV (postoperative nausea and vomiting)    was on dialysis i during pregnancy2014   Swelling of right side of face 11/01/2017   Thrombocytopenia (HCC)    07/2015 in setting of sepsis   Family History  Problem Relation Age of Onset   Other Mother        no medical problems per patient   Other Father        no medical problems per patient   Colon cancer Neg Hx    Liver cancer Neg Hx    Stomach cancer Neg Hx    Past Surgical History:  Procedure Laterality Date   A/V FISTULAGRAM N/A 01/08/2018   Procedure: A/V FISTULAGRAM - right arm;  Surgeon: Maeola Harman, MD;  Location: [facility] INVASIVE CV LAB;  Service: Cardiovascular;  Laterality: N/A;   AV FISTULA PLACEMENT Left    AV FISTULA PLACEMENT Right 04/14/2015   Procedure: CREATION OF RIGHT  RADIOCEPHALIC ARTERIOVENOUS (AV) FISTULA ;  Surgeon: Chuck Hint, MD;  Location: [facility] OR;  Service: Vascular;  Laterality: Right;   AV FISTULA PLACEMENT Right 11/22/2016   Procedure: ARTERIOVENOUS (AV) FISTULA CREATION;  Surgeon: Maeola Harman, MD;  Location: [facility] OR;  Service: Vascular;  Laterality: Right;   CESAREAN SECTION     2009   ESOPHAGOGASTRODUODENOSCOPY N/A 11/24/2016   Procedure: ESOPHAGOGASTRODUODENOSCOPY (EGD);  Surgeon: Meryl Dare, MD;  Location: [facility] ENDOSCOPY;  Service: Endoscopy;  Laterality: N/A;   EXCHANGE OF A DIALYSIS CATHETER Right 04/14/2015   Procedure: EXCHANGE OF RIGHT INTERNAL JUGULAR DIALYSIS CATHETER;  Surgeon: Chuck Hint, MD;  Location: [facility] OR;  Service: Vascular;  Laterality: Right;   INSERTION OF DIALYSIS CATHETER Left 11/22/2016   Procedure: INSERTION OF DIALYSIS CATHETER - LEFT INTERNAL JUGULAR PLACEMENT;  Surgeon: Maeola Harman, MD;  Location: [facility] OR;  Service: Vascular;  Laterality: Left;   IR AV DIALY SHUNT INTRO NEEDLE/INTRAC INITIAL W/PTA/STENT/IMG RT Right 02/15/2020   IR AV DIALY SHUNT INTRO NEEDLE/INTRACATH INITIAL W/PTA/IMG LEFT  10/18/2020   IR DIALY SHUNT INTRO NEEDLE/INTRACATH INITIAL W/IMG RIGHT Right 07/26/2017   IR DIALY SHUNT INTRO NEEDLE/INTRACATH INITIAL W/IMG RIGHT Right 10/18/2017   IR DIALY SHUNT INTRO NEEDLE/INTRACATH INITIAL W/IMG RIGHT Right 03/10/2018   IR DIALY  SHUNT INTRO NEEDLE/INTRACATH INITIAL W/IMG RIGHT Right 12/30/2018   IR PTA ADDL CENTRAL DIALYSIS SEG THRU DIALY CIRCUIT LEFT Left 10/18/2020   IR RADIOLOGIST EVAL & MGMT  07/02/2019   IR REMOVAL TUN CV CATH W/O FL  03/06/2017   IR TRANSCATH PLC STENT 1ST ART NOT LE CV CAR VERT CAR  12/30/2018   IR US GUIDE VASC ACCESS RIGHT  12/30/2018   IR US GUIDE VASC ACCESS RIGHT  12/30/2018   IR US GUIDE VASC ACCESS RIGHT  02/15/2020   IR VENO/EXT/UNI LEFT  12/30/2018   LIGATION OF ARTERIOVENOUS  FISTULA Left 11/05/2014   Procedure: LIGATION OF LEFT ARM   BRACHIO-CEPHALIC ARTERIOVENOUS  FISTULA ,& REPAIR OF BRACHIAL ARTERY.;  Surgeon: Pryor Ochoa, MD;  Location: [facility] OR;  Service: Vascular;  Laterality: Left;   PARATHYROIDECTOMY N/A 11/09/2021   Procedure: TOTAL PARATHYROIDECTOMY;  Surgeon: Darnell Level, MD;  Location: [facility] OR;  Service: General;  Laterality: N/A;   PERIPHERAL VASCULAR BALLOON ANGIOPLASTY  01/08/2018   Procedure: PERIPHERAL VASCULAR BALLOON ANGIOPLASTY;  Surgeon: Maeola Harman, MD;  Location: [facility] INVASIVE CV LAB;  Service: Cardiovascular;;  innominate vein   THROMBECTOMY W/ EMBOLECTOMY Right 11/16/2016   Procedure: THROMBECTOMY REVISION OF ARTERIOVENOUS FISTULA - RIGHT ARM;  Surgeon: Larina Earthly, MD;  Location: [facility] OR;  Service: Vascular;  Laterality: Right;   TUBAL LIGATION  2014    Short Social History:  Social History   Tobacco Use   Smoking status: Never   Smokeless tobacco: Never  Substance Use Topics   Alcohol use: No    Alcohol/week: 0.0 standard drinks of alcohol    No Known Allergies  Current Outpatient Medications  Medication Sig Dispense Refill   aspirin 81 MG chewable tablet Chew 81 mg by mouth 3 (three) times a week. 3 times each week     Cholecalciferol (D 1000) 25 MCG (1000 UT) capsule Take by mouth.     doxycycline (VIBRA-TABS) 100 MG tablet Take 1 tablet (100 mg total) by mouth 2 (two) times daily. 14 tablet 0   levothyroxine (SYNTHROID) 75 MCG tablet Take 1 tablet (75 mcg total) by mouth Daily at 0600. 30 tablet 2   MAGNESIUM-OXIDE 400 (240 Mg) MG tablet Take 1 tablet by mouth daily.     Multiple Vitamins-Minerals (MULTIVITAMIN WOMEN PO) Take by mouth.     mycophenolate (MYFORTIC) 180 MG EC tablet Take by mouth.     omeprazole (PRILOSEC) 20 MG capsule Take 20 mg by mouth daily. 1 tab daily     predniSONE (DELTASONE) 5 MG tablet Take 1 tablet by mouth daily.     tacrolimus (PROGRAF) 1 MG capsule Take 5mg  in the AM and 5mg  in the PM     No current facility-administered medications for this  visit.    Review of Systems  Constitutional:  Constitutional negative. HENT: HENT negative.  Eyes: Eyes negative.  Respiratory: Respiratory negative.  Cardiovascular: Cardiovascular negative.  GI: Gastrointestinal negative.  Musculoskeletal:       Right upper extremity discomfort Skin: Skin negative.  Hematologic: Hematologic/lymphatic negative.  Psychiatric: Psychiatric negative.        Objective:  Objective   Vitals:   09/11/23 0821  BP: 122/81  Pulse: 88  Resp: 20  Temp: 98.3 F (36.8 C)  SpO2: 97%  Weight: 111 lb (50.3 kg)  Height: 4\' 10"  (1.473 m)   Body mass index is 23.2 kg/m.  Physical Exam HENT:     Head: Normocephalic.     Mouth/Throat:  Mouth: Mucous membranes are moist.  Eyes:     Pupils: Pupils are equal, round, and reactive to light.  Cardiovascular:     Rate and Rhythm: Normal rate.     Pulses: Normal pulses.  Pulmonary:     Effort: Pulmonary effort is normal.  Abdominal:     General: Abdomen is flat.  Musculoskeletal:     Comments: Right upper extremity above the elbow is fuller than the left and the fistula is significantly pulsatile with no evidence of thrill and there are 2 areas of pseudoaneurysm as pictured below  Skin:    General: Skin is warm.     Capillary Refill: Capillary refill takes less than 2 seconds.  Neurological:     Mental Status: She is alert.     Data: No new studies     Assessment/Plan:    37 year old female has a history of end-stage renal disease with innominate vein intervention now status post renal transplant that is functioning well she does have significant pain in the right upper extremity with 2 large areas of pseudoaneurysm the fistula is very pulsatile.  Given that this fistula does have significant dysfunction I think it makes sense to ligate as it may not work in the future even if she needed it.  Will plan likely to create an incision at the antecubitum and ligate near the previous anastomosis and  then excised the 2 areas of pseudoaneurysm given that they are quite uncomfortable for her.  Will plan this in the near future and she can continue aspirin perioperatively.     Maeola Harman MD [facility]

## 2023-09-11 NOTE — Progress Notes (Signed)
Patient ID: Karla Garner, female   DOB: October 25, 1985, 37 y.o.   MRN: [id number]  Reason for Consult: Follow-up   Referred by Bufford Buttner, MD  Subjective:     HPI:  Karla Garner is a 37 y.o. 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  She is now status post renal transplant and has a functioning transplant without need for further dialysis.  She does have swelling of the upper arm with pain in the shoulder prominent veins on the chest and anterior shoulder.  She states that she has a very annoying sound that she can hear particularly when she is laying down that has been worsening with time.  She has not had any bleeding issues from the fistula in her kidney transplant has been functioning well.  She does not take blood thinners other than aspirin.  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   Glomerulonephritis    Hemodialysis patient [facility])    Hypertension    Menorrhagia with regular cycle 11/04/2018   Normocytic anemia 08/02/2015   PONV (postoperative nausea and vomiting)    was on dialysis i during pregnancy2014   Swelling of right side of face 11/01/2017   Thrombocytopenia (HCC)    07/2015 in setting of sepsis   Family History  Problem Relation Age of Onset   Other Mother        no medical problems per patient   Other Father        no medical problems per patient   Colon cancer Neg Hx    Liver cancer Neg Hx    Stomach cancer Neg Hx    Past Surgical History:  Procedure Laterality Date   A/V FISTULAGRAM N/A 01/08/2018   Procedure: A/V FISTULAGRAM - right arm;  Surgeon: Maeola Harman, MD;  Location: [facility] INVASIVE CV LAB;  Service: Cardiovascular;  Laterality: N/A;   AV FISTULA PLACEMENT Left    AV FISTULA PLACEMENT Right 04/14/2015   Procedure: CREATION OF RIGHT  RADIOCEPHALIC ARTERIOVENOUS (AV) FISTULA ;  Surgeon: Chuck Hint, MD;  Location: [facility] OR;  Service: Vascular;  Laterality: Right;   AV FISTULA PLACEMENT Right 11/22/2016   Procedure: ARTERIOVENOUS (AV) FISTULA CREATION;  Surgeon: Maeola Harman, MD;  Location: [facility] OR;  Service: Vascular;  Laterality: Right;   CESAREAN SECTION     2009   ESOPHAGOGASTRODUODENOSCOPY N/A 11/24/2016   Procedure: ESOPHAGOGASTRODUODENOSCOPY (EGD);  Surgeon: Meryl Dare, MD;  Location: [facility] ENDOSCOPY;  Service: Endoscopy;  Laterality: N/A;   EXCHANGE OF A DIALYSIS CATHETER Right 04/14/2015   Procedure: EXCHANGE OF RIGHT INTERNAL JUGULAR DIALYSIS CATHETER;  Surgeon: Chuck Hint, MD;  Location: [facility] OR;  Service: Vascular;  Laterality: Right;   INSERTION OF DIALYSIS CATHETER Left 11/22/2016   Procedure: INSERTION OF DIALYSIS CATHETER - LEFT INTERNAL JUGULAR PLACEMENT;  Surgeon: Maeola Harman, MD;  Location: [facility] OR;  Service: Vascular;  Laterality: Left;   IR AV DIALY SHUNT INTRO NEEDLE/INTRAC INITIAL W/PTA/STENT/IMG RT Right 02/15/2020   IR AV DIALY SHUNT INTRO NEEDLE/INTRACATH INITIAL W/PTA/IMG LEFT  10/18/2020   IR DIALY SHUNT INTRO NEEDLE/INTRACATH INITIAL W/IMG RIGHT Right 07/26/2017   IR DIALY SHUNT INTRO NEEDLE/INTRACATH INITIAL W/IMG RIGHT Right 10/18/2017   IR DIALY SHUNT INTRO NEEDLE/INTRACATH INITIAL W/IMG RIGHT Right 03/10/2018   IR DIALY  SHUNT INTRO NEEDLE/INTRACATH INITIAL W/IMG RIGHT Right 12/30/2018   IR PTA ADDL CENTRAL DIALYSIS SEG THRU DIALY CIRCUIT LEFT Left 10/18/2020   IR RADIOLOGIST EVAL & MGMT  07/02/2019   IR REMOVAL TUN CV CATH W/O FL  03/06/2017   IR TRANSCATH PLC STENT 1ST ART NOT LE CV CAR VERT CAR  12/30/2018   IR US GUIDE VASC ACCESS RIGHT  12/30/2018   IR US GUIDE VASC ACCESS RIGHT  12/30/2018   IR US GUIDE VASC ACCESS RIGHT  02/15/2020   IR VENO/EXT/UNI LEFT  12/30/2018   LIGATION OF ARTERIOVENOUS  FISTULA Left 11/05/2014   Procedure: LIGATION OF LEFT ARM   BRACHIO-CEPHALIC ARTERIOVENOUS  FISTULA ,& REPAIR OF BRACHIAL ARTERY.;  Surgeon: Pryor Ochoa, MD;  Location: [facility] OR;  Service: Vascular;  Laterality: Left;   PARATHYROIDECTOMY N/A 11/09/2021   Procedure: TOTAL PARATHYROIDECTOMY;  Surgeon: Darnell Level, MD;  Location: [facility] OR;  Service: General;  Laterality: N/A;   PERIPHERAL VASCULAR BALLOON ANGIOPLASTY  01/08/2018   Procedure: PERIPHERAL VASCULAR BALLOON ANGIOPLASTY;  Surgeon: Maeola Harman, MD;  Location: [facility] INVASIVE CV LAB;  Service: Cardiovascular;;  innominate vein   THROMBECTOMY W/ EMBOLECTOMY Right 11/16/2016   Procedure: THROMBECTOMY REVISION OF ARTERIOVENOUS FISTULA - RIGHT ARM;  Surgeon: Larina Earthly, MD;  Location: [facility] OR;  Service: Vascular;  Laterality: Right;   TUBAL LIGATION  2014    Short Social History:  Social History   Tobacco Use   Smoking status: Never   Smokeless tobacco: Never  Substance Use Topics   Alcohol use: No    Alcohol/week: 0.0 standard drinks of alcohol    No Known Allergies  Current Outpatient Medications  Medication Sig Dispense Refill   aspirin 81 MG chewable tablet Chew 81 mg by mouth 3 (three) times a week. 3 times each week     Cholecalciferol (D 1000) 25 MCG (1000 UT) capsule Take by mouth.     doxycycline (VIBRA-TABS) 100 MG tablet Take 1 tablet (100 mg total) by mouth 2 (two) times daily. 14 tablet 0   levothyroxine (SYNTHROID) 75 MCG tablet Take 1 tablet (75 mcg total) by mouth Daily at 0600. 30 tablet 2   MAGNESIUM-OXIDE 400 (240 Mg) MG tablet Take 1 tablet by mouth daily.     Multiple Vitamins-Minerals (MULTIVITAMIN WOMEN PO) Take by mouth.     mycophenolate (MYFORTIC) 180 MG EC tablet Take by mouth.     omeprazole (PRILOSEC) 20 MG capsule Take 20 mg by mouth daily. 1 tab daily     predniSONE (DELTASONE) 5 MG tablet Take 1 tablet by mouth daily.     tacrolimus (PROGRAF) 1 MG capsule Take 5mg  in the AM and 5mg  in the PM     No current facility-administered medications for this  visit.    Review of Systems  Constitutional:  Constitutional negative. HENT: HENT negative.  Eyes: Eyes negative.  Respiratory: Respiratory negative.  Cardiovascular: Cardiovascular negative.  GI: Gastrointestinal negative.  Musculoskeletal:       Right upper extremity discomfort Skin: Skin negative.  Hematologic: Hematologic/lymphatic negative.  Psychiatric: Psychiatric negative.        Objective:  Objective   Vitals:   09/11/23 0821  BP: 122/81  Pulse: 88  Resp: 20  Temp: 98.3 F (36.8 C)  SpO2: 97%  Weight: 111 lb (50.3 kg)  Height: 4\' 10"  (1.473 m)   Body mass index is 23.2 kg/m.  Physical Exam HENT:     Head: Normocephalic.     Mouth/Throat:  Mouth: Mucous membranes are moist.  Eyes:     Pupils: Pupils are equal, round, and reactive to light.  Cardiovascular:     Rate and Rhythm: Normal rate.     Pulses: Normal pulses.  Pulmonary:     Effort: Pulmonary effort is normal.  Abdominal:     General: Abdomen is flat.  Musculoskeletal:     Comments: Right upper extremity above the elbow is fuller than the left and the fistula is significantly pulsatile with no evidence of thrill and there are 2 areas of pseudoaneurysm as pictured below  Skin:    General: Skin is warm.     Capillary Refill: Capillary refill takes less than 2 seconds.  Neurological:     Mental Status: She is alert.     Data: No new studies     Assessment/Plan:    37 year old female has a history of end-stage renal disease with innominate vein intervention now status post renal transplant that is functioning well she does have significant pain in the right upper extremity with 2 large areas of pseudoaneurysm the fistula is very pulsatile.  Given that this fistula does have significant dysfunction I think it makes sense to ligate as it may not work in the future even if she needed it.  Will plan likely to create an incision at the antecubitum and ligate near the previous anastomosis and  then excised the 2 areas of pseudoaneurysm given that they are quite uncomfortable for her.  Will plan this in the near future and she can continue aspirin perioperatively.     Maeola Harman MD [facility]

## 2023-09-12 ENCOUNTER — Other Ambulatory Visit: Payer: Self-pay

## 2023-09-12 ENCOUNTER — Encounter: Payer: Self-pay | Admitting: Pharmacist

## 2023-09-15 ENCOUNTER — Other Ambulatory Visit: Payer: Self-pay | Admitting: Internal Medicine

## 2023-09-15 MED ORDER — MAGNESIUM OXIDE 400 MG PO TABS: 1.0000 | ORAL_TABLET | Freq: Every day | ORAL | 1 refills | Status: DC

## 2023-09-16 ENCOUNTER — Other Ambulatory Visit: Payer: Self-pay

## 2023-09-16 ENCOUNTER — Other Ambulatory Visit (HOSPITAL_COMMUNITY): Payer: Self-pay

## 2023-10-10 ENCOUNTER — Encounter (HOSPITAL_COMMUNITY): Payer: Self-pay | Admitting: Vascular Surgery

## 2023-10-10 ENCOUNTER — Other Ambulatory Visit: Payer: Self-pay

## 2023-10-10 NOTE — Progress Notes (Signed)
PCP - Dr. Jonah Blue Cardiologist - denies Transplant- Dr. Bufford Buttner  PPM/ICD - denies   Chest x-ray - 05/25/22 EKG - DOS Stress Test - 11/02/21 ECHO - 11/02/21 Cardiac Cath - denies  CPAP - denies  DM- denies  Blood Thinner Instructions: n/a Aspirin Instructions: continue  ERAS Protcol - NPO  COVID TEST- n/a  Anesthesia review: no  Patient verbally denies any shortness of breath, fever, cough and chest pain during phone call   -------------  SDW INSTRUCTIONS given:  Your procedure is scheduled on 12/20.  Report to [facility] Main Entrance "A" at 0530 A.M., and check in at the Admitting office.  Call this number if you have problems the morning of surgery:  [phone]   Remember:  Do not eat or drink after midnight the night before your surgery    Take these medicines the morning of surgery with A SIP OF WATER  levothyroxine, myfortic, prilosec, prednisone, bactrim, prograf, ASA   As of today, STOP taking any  Aleve, Naproxen, Ibuprofen, Motrin, Advil, Goody's, BC's, all herbal medications, fish oil, and all vitamins.                      Do not wear jewelry, make up, or nail polish            Do not wear lotions, powders, perfumes/colognes, or deodorant.            Do not shave 48 hours prior to surgery.  Men may shave face and neck.            Do not bring valuables to the hospital.            [facility] is not responsible for any belongings or valuables.  Do NOT Smoke (Tobacco/Vaping) 24 hours prior to your procedure If you use a CPAP at night, you may bring all equipment for your overnight stay.   Contacts, glasses, dentures or bridgework may not be worn into surgery.      For patients admitted to the hospital, discharge time will be determined by your treatment team.   Patients discharged the day of surgery will not be allowed to drive home, and someone needs to stay with them for 24 hours.    Special instructions:   [place]-  Preparing For Surgery  Before surgery, you can play an important role. Because skin is not sterile, your skin needs to be as free of germs as possible. You can reduce the number of germs on your skin by washing with CHG (chlorahexidine gluconate) Soap before surgery.  CHG is an antiseptic cleaner which kills germs and bonds with the skin to continue killing germs even after washing.    Oral Hygiene is also important to reduce your risk of infection.  Remember - BRUSH YOUR TEETH THE MORNING OF SURGERY WITH YOUR REGULAR TOOTHPASTE  Please do not use if you have an allergy to CHG or antibacterial soaps. If your skin becomes reddened/irritated stop using the CHG.  Do not shave (including legs and underarms) for at least 48 hours prior to first CHG shower. It is OK to shave your face.  Please follow these instructions carefully.   Shower the NIGHT BEFORE SURGERY and the MORNING OF SURGERY with DIAL Soap.   Pat yourself dry with a CLEAN TOWEL.  Wear CLEAN PAJAMAS to bed the night before surgery  Place CLEAN SHEETS on your bed the night of your first shower and DO NOT SLEEP  WITH PETS.   Day of Surgery: Please shower morning of surgery  Wear Clean/Comfortable clothing the morning of surgery Do not apply any deodorants/lotions.   Remember to brush your teeth WITH YOUR REGULAR TOOTHPASTE.   Questions were answered. Patient verbalized understanding of instructions.

## 2023-10-11 ENCOUNTER — Ambulatory Visit (HOSPITAL_COMMUNITY): Payer: BC Managed Care – PPO | Admitting: Anesthesiology

## 2023-10-11 ENCOUNTER — Other Ambulatory Visit: Payer: Self-pay

## 2023-10-11 ENCOUNTER — Ambulatory Visit (HOSPITAL_COMMUNITY)
Admission: RE | Admit: 2023-10-11 | Discharge: 2023-10-11 | Disposition: A | Discharge status: Home / Self Care | Payer: BC Managed Care – PPO | Source: Ambulatory Visit | Attending: Vascular Surgery | Admitting: Vascular Surgery

## 2023-10-11 ENCOUNTER — Encounter (HOSPITAL_COMMUNITY)
Admission: RE | Disposition: A | Discharge status: Home / Self Care | Payer: Self-pay | Source: Ambulatory Visit | Attending: Vascular Surgery

## 2023-10-11 DIAGNOSIS — K219 Gastro-esophageal reflux disease without esophagitis: Secondary | ICD-10-CM | POA: Diagnosis not present

## 2023-10-11 DIAGNOSIS — T82898A Other specified complication of vascular prosthetic devices, implants and grafts, initial encounter: Secondary | ICD-10-CM | POA: Diagnosis not present

## 2023-10-11 DIAGNOSIS — N185 Chronic kidney disease, stage 5: Secondary | ICD-10-CM

## 2023-10-11 DIAGNOSIS — I120 Hypertensive chronic kidney disease with stage 5 chronic kidney disease or end stage renal disease: Secondary | ICD-10-CM | POA: Diagnosis not present

## 2023-10-11 DIAGNOSIS — Z452 Encounter for adjustment and management of vascular access device: Secondary | ICD-10-CM | POA: Diagnosis not present

## 2023-10-11 DIAGNOSIS — N186 End stage renal disease: Secondary | ICD-10-CM | POA: Diagnosis not present

## 2023-10-11 DIAGNOSIS — Z940 Kidney transplant status: Secondary | ICD-10-CM | POA: Insufficient documentation

## 2023-10-11 DIAGNOSIS — T829XXA Unspecified complication of cardiac and vascular prosthetic device, implant and graft, initial encounter: Secondary | ICD-10-CM

## 2023-10-11 HISTORY — DX: Hypothyroidism, unspecified: E03.9

## 2023-10-11 HISTORY — DX: Gastro-esophageal reflux disease without esophagitis: K21.9

## 2023-10-11 HISTORY — PX: LIGATION OF ARTERIOVENOUS  FISTULA: SHX5948

## 2023-10-11 LAB — POCT I-STAT, CHEM 8
BUN: 27 mg/dL — ABNORMAL HIGH (ref 6–20)
Calcium, Ion: 1.16 mmol/L (ref 1.15–1.40)
Chloride: 110 mmol/L (ref 98–111)
Creatinine, Ser: 0.9 mg/dL (ref 0.44–1.00)
Glucose, Bld: 93 mg/dL (ref 70–99)
HCT: 49 % — ABNORMAL HIGH (ref 36.0–46.0)
Hemoglobin: 16.7 g/dL — ABNORMAL HIGH (ref 12.0–15.0)
Potassium: 4.1 mmol/L (ref 3.5–5.1)
Sodium: 140 mmol/L (ref 135–145)
TCO2: 18 mmol/L — ABNORMAL LOW (ref 22–32)

## 2023-10-11 LAB — POCT PREGNANCY, URINE: Preg Test, Ur: NEGATIVE

## 2023-10-11 SURGERY — LIGATION OF ARTERIOVENOUS  FISTULA
Anesthesia: General | Laterality: Right

## 2023-10-11 MED ORDER — HYDROCODONE-ACETAMINOPHEN 5-325 MG PO TABS: 1.0000 | ORAL_TABLET | Freq: Four times a day (QID) | ORAL | 0 refills | Status: DC | PRN

## 2023-10-11 MED ORDER — LIDOCAINE-EPINEPHRINE (PF) 1 %-1:200000 IJ SOLN
INTRAMUSCULAR | Status: AC
  Filled 2023-10-11: qty 30

## 2023-10-11 MED ORDER — CEFAZOLIN SODIUM-DEXTROSE 2-4 GM/100ML-% IV SOLN
2.0000 g | INTRAVENOUS | Status: AC
  Administered 2023-10-11: 2 g via INTRAVENOUS
  Filled 2023-10-11: qty 100

## 2023-10-11 MED ORDER — ORAL CARE MOUTH RINSE: 15.0000 mL | Freq: Once | OROMUCOSAL | Status: AC

## 2023-10-11 MED ORDER — DEXAMETHASONE SODIUM PHOSPHATE 10 MG/ML IJ SOLN
INTRAMUSCULAR | Status: AC
  Filled 2023-10-11: qty 1

## 2023-10-11 MED ORDER — FENTANYL CITRATE (PF) 250 MCG/5ML IJ SOLN
INTRAMUSCULAR | Status: DC | PRN
  Administered 2023-10-11 (×3): 50 ug via INTRAVENOUS

## 2023-10-11 MED ORDER — FENTANYL CITRATE (PF) 100 MCG/2ML IJ SOLN
25.0000 ug | INTRAMUSCULAR | Status: DC | PRN
  Administered 2023-10-11 (×3): 50 ug via INTRAVENOUS

## 2023-10-11 MED ORDER — FENTANYL CITRATE (PF) 100 MCG/2ML IJ SOLN
INTRAMUSCULAR | Status: AC
  Filled 2023-10-11: qty 2

## 2023-10-11 MED ORDER — CHLORHEXIDINE GLUCONATE 4 % EX SOLN
60.0000 mL | Freq: Once | CUTANEOUS | Status: DC
Start: 2023-10-11 — End: 2023-10-11

## 2023-10-11 MED ORDER — HEPARIN 6000 UNIT IRRIGATION SOLUTION
Status: AC
  Filled 2023-10-11: qty 500

## 2023-10-11 MED ORDER — CHLORHEXIDINE GLUCONATE 4 % EX SOLN: 60.0000 mL | Freq: Once | CUTANEOUS | Status: DC

## 2023-10-11 MED ORDER — AMISULPRIDE (ANTIEMETIC) 5 MG/2ML IV SOLN
INTRAVENOUS | Status: AC
  Filled 2023-10-11: qty 4

## 2023-10-11 MED ORDER — FENTANYL CITRATE (PF) 250 MCG/5ML IJ SOLN
INTRAMUSCULAR | Status: AC
  Filled 2023-10-11: qty 5

## 2023-10-11 MED ORDER — MIDAZOLAM HCL 2 MG/2ML IJ SOLN
INTRAMUSCULAR | Status: AC
Start: 2023-10-11 — End: ?
  Filled 2023-10-11: qty 2

## 2023-10-11 MED ORDER — LIDOCAINE 2% (20 MG/ML) 5 ML SYRINGE
INTRAMUSCULAR | Status: AC
  Filled 2023-10-11: qty 5

## 2023-10-11 MED ORDER — PHENYLEPHRINE HCL-NACL 20-0.9 MG/250ML-% IV SOLN
INTRAVENOUS | Status: DC | PRN
  Administered 2023-10-11: 40 ug/min via INTRAVENOUS

## 2023-10-11 MED ORDER — ACETAMINOPHEN 500 MG PO TABS
1000.0000 mg | ORAL_TABLET | Freq: Once | ORAL | Status: AC
  Administered 2023-10-11: 1000 mg via ORAL
  Filled 2023-10-11: qty 2

## 2023-10-11 MED ORDER — PROPOFOL 10 MG/ML IV BOLUS
INTRAVENOUS | Status: AC
  Filled 2023-10-11: qty 20

## 2023-10-11 MED ORDER — PHENYLEPHRINE 80 MCG/ML (10ML) SYRINGE FOR IV PUSH (FOR BLOOD PRESSURE SUPPORT)
PREFILLED_SYRINGE | INTRAVENOUS | Status: DC | PRN
  Administered 2023-10-11: 80 ug via INTRAVENOUS
  Administered 2023-10-11: 160 ug via INTRAVENOUS
  Administered 2023-10-11: 80 ug via INTRAVENOUS
  Administered 2023-10-11: 160 ug via INTRAVENOUS

## 2023-10-11 MED ORDER — OXYCODONE HCL 5 MG/5ML PO SOLN: 5.0000 mg | Freq: Once | ORAL | Status: AC | PRN

## 2023-10-11 MED ORDER — PHENYLEPHRINE 80 MCG/ML (10ML) SYRINGE FOR IV PUSH (FOR BLOOD PRESSURE SUPPORT)
PREFILLED_SYRINGE | INTRAVENOUS | Status: AC
  Filled 2023-10-11: qty 10

## 2023-10-11 MED ORDER — ONDANSETRON HCL 4 MG/2ML IJ SOLN
INTRAMUSCULAR | Status: AC
  Filled 2023-10-11: qty 2

## 2023-10-11 MED ORDER — OXYCODONE HCL 5 MG PO TABS
ORAL_TABLET | ORAL | Status: AC
  Filled 2023-10-11: qty 1

## 2023-10-11 MED ORDER — PROPOFOL 10 MG/ML IV BOLUS
INTRAVENOUS | Status: DC | PRN
  Administered 2023-10-11: 100 mg via INTRAVENOUS
  Administered 2023-10-11: 50 mg via INTRAVENOUS

## 2023-10-11 MED ORDER — 0.9 % SODIUM CHLORIDE (POUR BTL) OPTIME
TOPICAL | Status: DC | PRN
  Administered 2023-10-11: 1000 mL

## 2023-10-11 MED ORDER — SODIUM CHLORIDE 0.9 % IV SOLN: INTRAVENOUS | Status: DC | PRN

## 2023-10-11 MED ORDER — AMISULPRIDE (ANTIEMETIC) 5 MG/2ML IV SOLN
10.0000 mg | Freq: Once | INTRAVENOUS | Status: AC | PRN
  Administered 2023-10-11: 10 mg via INTRAVENOUS

## 2023-10-11 MED ORDER — HEPARIN 6000 UNIT IRRIGATION SOLUTION
Status: DC | PRN
  Administered 2023-10-11: 1

## 2023-10-11 MED ORDER — DEXAMETHASONE SODIUM PHOSPHATE 10 MG/ML IJ SOLN
INTRAMUSCULAR | Status: DC | PRN
  Administered 2023-10-11: 5 mg via INTRAVENOUS

## 2023-10-11 MED ORDER — LIDOCAINE 2% (20 MG/ML) 5 ML SYRINGE
INTRAMUSCULAR | Status: DC | PRN
  Administered 2023-10-11: 60 mg via INTRAVENOUS

## 2023-10-11 MED ORDER — CHLORHEXIDINE GLUCONATE 0.12 % MT SOLN
15.0000 mL | Freq: Once | OROMUCOSAL | Status: AC
  Administered 2023-10-11: 15 mL via OROMUCOSAL
  Filled 2023-10-11: qty 15

## 2023-10-11 MED ORDER — OXYCODONE HCL 5 MG PO TABS
5.0000 mg | ORAL_TABLET | Freq: Once | ORAL | Status: AC | PRN
  Administered 2023-10-11: 5 mg via ORAL

## 2023-10-11 MED ORDER — MIDAZOLAM HCL 2 MG/2ML IJ SOLN
INTRAMUSCULAR | Status: DC | PRN
  Administered 2023-10-11: 2 mg via INTRAVENOUS

## 2023-10-11 MED ORDER — ONDANSETRON HCL 4 MG/2ML IJ SOLN
INTRAMUSCULAR | Status: DC | PRN
  Administered 2023-10-11: 4 mg via INTRAVENOUS

## 2023-10-11 SURGICAL SUPPLY — 25 items
BAG COUNTER SPONGE SURGICOUNT (BAG) ×1 IMPLANT
CANISTER SUCT 3000ML PPV (MISCELLANEOUS) ×1 IMPLANT
CLIP LIGATING EXTRA MED SLVR (CLIP) ×1 IMPLANT
CLIP LIGATING EXTRA SM BLUE (MISCELLANEOUS) ×1 IMPLANT
DERMABOND ADVANCED .7 DNX12 (GAUZE/BANDAGES/DRESSINGS) ×1 IMPLANT
ELECT REM PT RETURN 9FT ADLT (ELECTROSURGICAL) ×1
ELECTRODE REM PT RTRN 9FT ADLT (ELECTROSURGICAL) ×1 IMPLANT
GLOVE BIO SURGEON STRL SZ7.5 (GLOVE) ×1 IMPLANT
GOWN STRL REUS W/ TWL LRG LVL3 (GOWN DISPOSABLE) ×2 IMPLANT
GOWN STRL REUS W/ TWL XL LVL3 (GOWN DISPOSABLE) ×1 IMPLANT
KIT BASIN OR (CUSTOM PROCEDURE TRAY) ×1 IMPLANT
KIT TURNOVER KIT B (KITS) ×1 IMPLANT
NS IRRIG 1000ML POUR BTL (IV SOLUTION) ×1 IMPLANT
PACK CV ACCESS (CUSTOM PROCEDURE TRAY) ×1 IMPLANT
PAD ARMBOARD 7.5X6 YLW CONV (MISCELLANEOUS) ×2 IMPLANT
POWDER SURGICEL 3.0 GRAM (HEMOSTASIS) IMPLANT
SUT ETHILON 3 0 PS 1 (SUTURE) IMPLANT
SUT MNCRL AB 4-0 PS2 18 (SUTURE) ×1 IMPLANT
SUT PROLENE 5 0 C 1 24 (SUTURE) IMPLANT
SUT PROLENE 6 0 BV (SUTURE) IMPLANT
SUT SILK 0 TIES 10X30 (SUTURE) ×1 IMPLANT
SUT VIC AB 3-0 SH 27X BRD (SUTURE) ×1 IMPLANT
TOWEL GREEN STERILE (TOWEL DISPOSABLE) ×1 IMPLANT
UNDERPAD 30X36 HEAVY ABSORB (UNDERPADS AND DIAPERS) ×1 IMPLANT
WATER STERILE IRR 1000ML POUR (IV SOLUTION) ×1 IMPLANT

## 2023-10-11 NOTE — Discharge Instructions (Signed)

## 2023-10-11 NOTE — Anesthesia Procedure Notes (Signed)
Procedure Name: LMA Insertion Date/Time: 10/11/2023 7:36 AM  Performed by: Ammie Dalton, CRNAPre-anesthesia Checklist: Patient identified, Emergency Drugs available, Suction available and Patient being monitored Patient Re-evaluated:Patient Re-evaluated prior to induction Oxygen Delivery Method: Circle System Utilized Preoxygenation: Pre-oxygenation with 100% oxygen Induction Type: IV induction Ventilation: Mask ventilation without difficulty LMA: LMA inserted LMA Size: 3.0 Number of attempts: 1 Airway Equipment and Method: Bite block Placement Confirmation: positive ETCO2 Dental Injury: Teeth and Oropharynx as per pre-operative assessment

## 2023-10-11 NOTE — Anesthesia Preprocedure Evaluation (Addendum)
Anesthesia Evaluation  Patient identified by MRN, date of birth, ID band Patient awake    Reviewed: Allergy & Precautions, NPO status , Patient's Chart, lab work & pertinent test results  History of Anesthesia Complications (+) PONV and history of anesthetic complications  Airway Mallampati: II  TM Distance: >3 FB Neck ROM: Full    Dental  (+) Dental Advisory Given   Pulmonary neg pulmonary ROS   breath sounds clear to auscultation       Cardiovascular hypertension, Pt. on medications  Rhythm:Regular Rate:Normal     Neuro/Psych negative neurological ROS     GI/Hepatic Neg liver ROS,GERD  ,,  Endo/Other  Hypothyroidism    Renal/GU ESRFRenal disease (s/p renal transplant 1.5 years ago)     Musculoskeletal   Abdominal   Peds  Hematology  (+) Blood dyscrasia, anemia   Anesthesia Other Findings   Reproductive/Obstetrics                             Anesthesia Physical Anesthesia Plan  ASA: 3  Anesthesia Plan: General   Post-op Pain Management: Tylenol PO (pre-op)*   Induction: Intravenous  PONV Risk Score and Plan: 4 or greater and Dexamethasone, Ondansetron, Midazolam and Treatment may vary due to age or medical condition  Airway Management Planned: LMA  Additional Equipment:   Intra-op Plan:   Post-operative Plan: Extubation in OR  Informed Consent: I have reviewed the patients History and Physical, chart, labs and discussed the procedure including the risks, benefits and alternatives for the proposed anesthesia with the patient or authorized representative who has indicated his/her understanding and acceptance.     Dental advisory given  Plan Discussed with: CRNA  Anesthesia Plan Comments:        Anesthesia Quick Evaluation

## 2023-10-11 NOTE — Anesthesia Postprocedure Evaluation (Signed)
Anesthesia Post Note  Patient: Karla Garner  Procedure(s) Performed: EXCISION AND LIGATION OF RIGHT ARM ARTERIOVENOUS  FISTULA (Right)     Patient location during evaluation: PACU Anesthesia Type: General Level of consciousness: awake and alert Pain management: pain level controlled Vital Signs Assessment: post-procedure vital signs reviewed and stable Respiratory status: spontaneous breathing, nonlabored ventilation, respiratory function stable and patient connected to nasal cannula oxygen Cardiovascular status: blood pressure returned to baseline and stable Postop Assessment: no apparent nausea or vomiting Anesthetic complications: no  No notable events documented.  Last Vitals:  Vitals:   10/11/23 0930 10/11/23 0945  BP: 126/78 132/81  Pulse: (!) 104 94  Resp: (!) 23 11  Temp:  36.7 C  SpO2: 95% 93%    Last Pain:  Vitals:   10/11/23 0945  TempSrc:   PainSc: 4                  Kennieth Rad

## 2023-10-11 NOTE — Transfer of Care (Signed)
Immediate Anesthesia Transfer of Care Note  Patient: Karla Garner  Procedure(s) Performed: EXCISION AND LIGATION OF RIGHT ARM ARTERIOVENOUS  FISTULA (Right)  Patient Location: PACU  Anesthesia Type:General  Level of Consciousness: awake, alert , and patient cooperative  Airway & Oxygen Therapy: Patient Spontanous Breathing  Post-op Assessment: Report given to RN and Post -op Vital signs reviewed and stable  Post vital signs: Reviewed and stable  Last Vitals:  Vitals Value Taken Time  BP 147/87 10/11/23 0848  Temp    Pulse 79 10/11/23 0852  Resp 12 10/11/23 0852  SpO2 97 % 10/11/23 0852  Vitals shown include unfiled device data.  Last Pain:  Vitals:   10/11/23 0635  TempSrc:   PainSc: 0-No pain         Complications: No notable events documented.

## 2023-10-11 NOTE — Op Note (Signed)
    Patient name: Karla Garner MRN: [id number] DOB: Mar 14, 1986 Sex: female  10/11/2023 Pre-operative Diagnosis: History of end-stage renal disease now status post transplant with malfunction right arm AV fistula Post-operative diagnosis:  Same Surgeon:  Luanna Salk. Randie Heinz, MD Assistant: Aggie Moats, PA Procedure Performed:  Ligation and excision of right arm AV fistula  Indications: 37 year old female with a history of end-stage renal disease and previously dialyzed via right arm AV fistula now status post renal transplant and continues to have pain in the right upper extremity with pulsatile fistula and is now indicated for ligation and excision of 2 areas of pseudoaneurysm.  In experience assistant was necessary to facilitate exposure of the fistula as well as perform excision and ligation.  Findings: Fistula was ligated proximally and excised to the upper arm level including 2 areas of pseudoaneurysm and a completion there was a palpable ulnar pulse at the wrist with minimal radial artery signal and this is consistent with preoperative exam.   Procedure:  The patient was identified in the holding area and taken to the operating room where she was placed supine on upper table and LMA anesthesia was induced.  She was sterilely prepped and draped in the right upper extremity in usual fashion, antibiotics were administered and a timeout was called.  Her previous incision near the antecubitum was reopened and we dissected down to the fistula encircled this with Vesseloops in a Potts configuration.  Elliptical type incision was then created around the 2 areas of pseudoaneurysmal degeneration and we exposed the fistula for a long segment including to connecting the antecubital incision.  We then clamped the fistula proximally and massage the blood out of the fistula and then clamped distally.  Transected at both ends.  Proximally we oversewed with a running 5-0 Prolene suture in a mattress  configuration.  Distally we flushed with heparinized saline and reclamped it and oversewed it with 5-0 Prolene suture and then tied off with 2-0 silk.  Fistula is totally excised.  We irrigated the wounds obtain hemostasis and closed in layers of Vicryl and Monocryl.  Dermabond was placed at the skin level.  The patient was then awakened from anesthesia having tolerated the procedure without immediate complication.  All counts were correct at completion.  EBL: 50 cc    Doshie Maggi C. Randie Heinz, MD [facility] Office: [phone] Pager: [phone]

## 2023-10-11 NOTE — Interval H&P Note (Signed)
History and Physical Interval Note:  10/11/2023 7:19 AM  Karla Garner  has presented today for surgery, with the diagnosis of Complication of arteriovenous fistula.  The various methods of treatment have been discussed with the patient and family. After consideration of risks, benefits and other options for treatment, the patient has consented to  Procedure(s): LIGATION OF RIGHT ARM ARTERIOVENOUS  FISTULA (Right) as a surgical intervention.  The patient's history has been reviewed, patient examined, no change in status, stable for surgery.  I have reviewed the patient's chart and labs.  Questions were answered to the patient's satisfaction.     Lemar Livings

## 2023-10-12 ENCOUNTER — Encounter (HOSPITAL_COMMUNITY): Payer: Self-pay | Admitting: Vascular Surgery

## 2023-10-15 ENCOUNTER — Ambulatory Visit: Payer: BC Managed Care – PPO | Admitting: Internal Medicine

## 2023-10-30 ENCOUNTER — Other Ambulatory Visit: Payer: Self-pay

## 2023-10-30 ENCOUNTER — Ambulatory Visit: Payer: BC Managed Care – PPO

## 2023-10-30 VITALS — BP 128/85 | HR 91 | Ht 59.0 in | Wt 115.0 lb

## 2023-10-30 DIAGNOSIS — Z3042 Encounter for surveillance of injectable contraceptive: Secondary | ICD-10-CM

## 2023-10-30 MED ORDER — MEDROXYPROGESTERONE ACETATE 150 MG/ML IM SUSY
150.0000 mg | PREFILLED_SYRINGE | Freq: Once | INTRAMUSCULAR | Status: AC
  Administered 2023-10-30: 150 mg via INTRAMUSCULAR

## 2023-10-30 NOTE — Progress Notes (Signed)
 Karla Garner Mad here for Depo-Provera  Injection. Injection administered without complication. Patient will return in 3 months for next injection between 3/26 and 4/9. Next annual visit due 11/2023. Next pap smear is due 11/2023.  Patient to schedule annual visit with depo injection at checkout.   [name], RN 10/30/2023  3:23 PM

## 2023-11-06 ENCOUNTER — Encounter: Payer: Self-pay | Admitting: Physician Assistant

## 2023-11-06 ENCOUNTER — Ambulatory Visit (INDEPENDENT_AMBULATORY_CARE_PROVIDER_SITE_OTHER): Payer: BC Managed Care – PPO | Admitting: Physician Assistant

## 2023-11-06 VITALS — BP 123/88 | HR 97 | Temp 98.4°F | Resp 18 | Ht 59.0 in | Wt 114.2 lb

## 2023-11-06 DIAGNOSIS — Z940 Kidney transplant status: Secondary | ICD-10-CM

## 2023-11-06 NOTE — Progress Notes (Signed)
 POST OPERATIVE OFFICE NOTE    CC:  F/u for surgery  Interpreter on ipad utilized for this visit.   HPI:  This is a 38 y.o. female who is s/p ligation and excision of right arm AVF on 10/11/2023 by Dr. Vikki Graves   She has a history of end-stage renal disease and previously dialyzed via right arm AV fistula now status post renal transplant and she was continuing to have pain in the right upper extremity with pulsatile fistula and was indicated for ligation and excision.  She has hx of SVC syndrome with hx of innominate vein intervention.  She states that she is still having some pain in the upper arm and neck and her arm swells.  She asks why the entire fistula was not removed as she has a hardening on the upper arm.  She wants to know why it doesn't look like the left arm.     No Known Allergies  Current Outpatient Medications  Medication Sig Dispense Refill   aspirin  81 MG chewable tablet Chew 81 mg by mouth daily.     Cholecalciferol (VITAMIN D-3) 125 MCG (5000 UT) TABS Take 5,000 Units by mouth daily.     doxycycline  (VIBRA -TABS) 100 MG tablet Take 1 tablet (100 mg total) by mouth 2 (two) times daily. (Patient not taking: Reported on 10/09/2023) 14 tablet 0   HYDROcodone -acetaminophen  (NORCO) 5-325 MG tablet Take 1 tablet by mouth every 6 (six) hours as needed for moderate pain (pain score 4-6). (Patient not taking: Reported on 10/30/2023) 15 tablet 0   levothyroxine  (SYNTHROID ) 75 MCG tablet Take 1 tablet (75 mcg total) by mouth Daily at 0600. (Patient taking differently: Take 175 mcg by mouth daily before breakfast.) 30 tablet 2   magnesium  oxide (MAG-OX) 400 MG tablet Take 1 tablet (400 mg total) by mouth daily. (Patient taking differently: Take 1 tablet by mouth 2 (two) times daily.) 100 tablet 1   medroxyPROGESTERone  (DEPO-PROVERA ) 150 MG/ML injection Inject 150 mg into the muscle every 3 (three) months.     Multiple Vitamins-Minerals (MULTIVITAMIN WOMEN PO) Take by mouth.      mycophenolate (MYFORTIC) 180 MG EC tablet Take 360 mg by mouth 2 (two) times daily.     omeprazole (PRILOSEC) 40 MG capsule Take 40 mg by mouth 2 (two) times daily before a meal.     predniSONE (DELTASONE) 5 MG tablet Take 5 mg by mouth daily.     tacrolimus (PROGRAF) 1 MG capsule Take 2 mg by mouth 2 (two) times daily.     No current facility-administered medications for this visit.     ROS:  See HPI  Physical Exam:  Today's Vitals   11/06/23 0922  BP: 123/88  Pulse: 97  Resp: 18  Temp: 98.4 F (36.9 C)  TempSrc: Temporal  SpO2: 97%  Weight: 114 lb 3.2 oz (51.8 kg)  Height: 4\' 11"  (1.499 m)   Body mass index is 23.07 kg/m.   Incision:  healing nicely Extremities:   There is a palpable left radial pulse.   Motor and sensory are in tact.   There is an area of thrombosed fistula in the upper arm.    Assessment/Plan:  This is a 38 y.o. female who is s/p:  ligation and excision of right arm AVF on 10/11/2023 by Dr. Vikki Graves   -pt with palpable left radial pulse -pt seen with Dr. Vikki Graves and he explained to her that the area on the upper arm will eventually go away.  Discussed with  pt that this could take several weeks and over time, her pain and swelling should also resolve.   -incision is healing nicely and encouraged her to continue to clean with soap and water. -the pt will follow up as needed. She knows to call if she has any issues.    Maryanna Smart, [facility] [phone]  Clinic MD:  Vikki Graves

## 2023-12-09 ENCOUNTER — Ambulatory Visit: Payer: BC Managed Care – PPO | Admitting: Neurology

## 2023-12-12 ENCOUNTER — Ambulatory Visit: Payer: BC Managed Care – PPO | Admitting: Neurology

## 2024-01-13 ENCOUNTER — Other Ambulatory Visit: Payer: Self-pay | Admitting: Internal Medicine

## 2024-01-14 ENCOUNTER — Other Ambulatory Visit: Payer: Self-pay | Admitting: Internal Medicine

## 2024-01-14 NOTE — Telephone Encounter (Signed)
 Requested medication (s) are due for refill today: yes (pt is taking 2 per day  Requested medication (s) are on the active medication list: yes  Last refill:  09/14/24 #100 tabs  Future visit scheduled: yes  Notes to clinic:  overdue Mg level   Requested Prescriptions  Pending Prescriptions Disp Refills   magnesium oxide (MAG-OX) 400 (240 Mg) MG tablet [Pharmacy Med Name: magnesium oxide 400 mg (241.3 mg magnesium) tablet] 100 tablet 1    Sig: Take 1 tablet (400 mg total) by mouth daily.     Endocrinology:  Minerals - Magnesium Supplementation Failed - 01/14/2024  4:06 PM      Failed - Mg Level in normal range and within 360 days    Magnesium  Date Value Ref Range Status  03/26/2016 3.0 (H) 1.7 - 2.4 mg/dL Final         Passed - Cr in normal range and within 360 days    Creat  Date Value Ref Range Status  02/16/2013 3.74 (H) 0.50 - 1.10 mg/dL Final   Creatinine, Ser  Date Value Ref Range Status  10/11/2023 0.90 0.44 - 1.00 mg/dL Final   Creatinine, Urine  Date Value Ref Range Status  02/23/2013 47.86 mg/dL Final         Passed - Valid encounter within last 12 months    Recent Outpatient Visits           4 months ago Annual physical exam   [facility] [place]. [facility] Marcine Matar, MD   1 year ago Annual physical exam   [facility] [place]. [facility] Marcine Matar, MD   1 year ago Weight loss   [facility] [place]. [facility], Marzella Schlein, [place]   2 years ago Encounter to establish care   [facility] [place]. [facility] Marcine Matar, MD       Future Appointments             In 1 month Laural Benes Binnie Rail, MD [facility] - A Dept Of [place]. [facility]

## 2024-01-14 NOTE — Telephone Encounter (Signed)
 Copied from CRM [id number]. Topic: Clinical - Medication Refill >> Jan 14, 2024 10:40 AM Marland Kitchen D wrote: Most Recent Primary Care Visit:  Provider: Jonah Blue B  Department[facility]  Visit Type: OFFICE VISIT  Date: 09/05/2023  Medication: magnesium oxide (MAG-OX) 400 MG tablet  Has the patient contacted their pharmacy? Yes pharmacy called to have the prescription filled they are suppose to ship it to the patient today (Agent: If no, request that the patient contact the pharmacy for the refill. If patient does not wish to contact the pharmacy document the reason why and proceed with request.) (Agent: If yes, when and what did the pharmacy advise?)  Is this the correct pharmacy for this prescription? Yes If no, delete pharmacy and type the correct one.  This is the patient's preferred pharmacy:   [facility] [place], [address] [facility] [address] [place] [address] Phone: [phone] Fax: [phone]   Has the prescription been filled recently? Yes in January for a 60 day supply   Is the patient out of the medication? Yes  Has the patient been seen for an appointment in the last year OR does the patient have an upcoming appointment? Yes  Can we respond through MyChart? No  Agent: Please be advised that Rx refills may take up to 3 business days. We ask that you follow-up with your pharmacy.

## 2024-01-21 ENCOUNTER — Ambulatory Visit: Payer: BC Managed Care – PPO | Admitting: Neurology

## 2024-01-21 ENCOUNTER — Encounter: Payer: Self-pay | Admitting: Neurology

## 2024-01-21 VITALS — BP 131/89 | HR 91 | Ht 59.0 in | Wt 122.0 lb

## 2024-01-21 DIAGNOSIS — Z940 Kidney transplant status: Secondary | ICD-10-CM | POA: Diagnosis not present

## 2024-01-21 DIAGNOSIS — G43709 Chronic migraine without aura, not intractable, without status migrainosus: Secondary | ICD-10-CM | POA: Insufficient documentation

## 2024-01-21 MED ORDER — SUMATRIPTAN SUCCINATE 50 MG PO TABS: ORAL_TABLET | ORAL | 6 refills | Status: DC

## 2024-01-21 MED ORDER — PROPRANOLOL HCL ER 60 MG PO CP24: 60.0000 mg | ORAL_CAPSULE | Freq: Every day | ORAL | 11 refills | Status: DC

## 2024-01-21 NOTE — Progress Notes (Signed)
 Chief Complaint  Patient presents with   New Patient (Initial Visit)    Pt in 15, here with interpreter Odetta Pink  Pt is referred for migraines. Pt states she has right side face pulsating pain on and off. States it has been happening for 2 years.       ASSESSMENT AND PLAN  Akeema Broder is a 38 y.o. female   Chronic migraine with worsening headache History of kidney transplant  She  would like complete evaluation to rule out structural abnormality, MRI of the brain  Advise her to stop daily Afrin use  Propranolol ER 60 mg daily as migraine prevention   DIAGNOSTIC DATA (LABS, IMAGING, TESTING) - I reviewed patient records, labs, notes, testing and imaging myself where available.   MEDICAL HISTORY:  Malia Corsi is a 38 year old female, seen in request by  Dr.  Laural Benes, Gavin Pound B, for evaluation of chronic migraine headache, initial evaluation was on January 21, 2024 with interpreter, she [place] immigrant [place] Hispanic speaking  History is obtained from the patient and review of electronic medical records. I personally reviewed pertinent available imaging films in PACS.   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, kidney transplant in August 2023, postsurgically, she began to experience frequent headaches  She already has long history of intermittent headache, nasal congestion feelings, has been using at least twice Afrin nasal spray over the past 10 years, described difficulty breathing through her nose if she does not use the spray  Since August 2023, she complains of increased headaches, triggered by neck movement, sometimes intense pounding headache can last for less than 30 minutes, relieved by resting, closing her eyes, drink tea,  She currently working cleaning houses, denied  difficulty  Laboratory evaluation in October 2024, tacrolimus level 7.1, normal CMP creatinine of 0.78, hemoglobin of 16.1,  PHYSICAL EXAM:   Vitals:   01/21/24 1500  BP: 131/89  Pulse: 91  Weight: 122 lb (55.3 kg)  Height: 4\' 11"  (1.499 m)   Body mass index is 24.64 kg/m.  PHYSICAL EXAMNIATION:  Gen: NAD, conversant, well nourised, well groomed                     Cardiovascular: Regular rate rhythm, no peripheral edema, warm, nontender. Eyes: Conjunctivae clear without exudates or hemorrhage Neck: Supple, no carotid bruits. Pulmonary: Clear to auscultation bilaterally   NEUROLOGICAL EXAM:  MENTAL STATUS: Speech/cognition: Awake, alert, oriented to history taking and casual conversation CRANIAL NERVES: CN II: Visual fields are full to confrontation. Pupils are round equal and briskly reactive to light. Fundoscopy was normal bilaterally CN III, IV, VI: extraocular movement are normal. No ptosis. CN V: Facial sensation is intact to light touch CN VII: Face is symmetric with normal eye closure  CN VIII: Hearing is normal to causal conversation. CN IX, X: Phonation is normal. CN XI: Head turning and shoulder shrug are intact  MOTOR: There is no pronator drift of out-stretched arms. Muscle bulk and tone are normal. Muscle strength is normal.  REFLEXES: Reflexes are 2+ and symmetric at the biceps, triceps, knees, and ankles. Plantar responses are flexor.  SENSORY: Intact to light touch, pinprick and vibratory sensation are intact in fingers and toes.  COORDINATION: There is no trunk or limb dysmetria noted.  GAIT/STANCE: Posture is normal. Gait is steady with normal steps,  base, arm swing, and turning. Heel and toe walking are normal. Tandem gait is normal.  Romberg is absent.  REVIEW OF SYSTEMS:  Full 14 system review of systems performed and notable only for as above All other review of systems were negative.   ALLERGIES: No Known Allergies  HOME  MEDICATIONS: Current Outpatient Medications  Medication Sig Dispense Refill   aspirin 81 MG chewable tablet Chew 81 mg by mouth daily.     Cholecalciferol (VITAMIN D-3) 125 MCG (5000 UT) TABS Take 5,000 Units by mouth daily.     doxycycline (VIBRA-TABS) 100 MG tablet Take 1 tablet (100 mg total) by mouth 2 (two) times daily. 14 tablet 0   HYDROcodone-acetaminophen (NORCO) 5-325 MG tablet Take 1 tablet by mouth every 6 (six) hours as needed for moderate pain (pain score 4-6). 15 tablet 0   levothyroxine (SYNTHROID) 75 MCG tablet Take 1 tablet (75 mcg total) by mouth Daily at 0600. (Patient taking differently: Take 175 mcg by mouth daily before breakfast.) 30 tablet 2   magnesium oxide (MAG-OX) 400 (240 Mg) MG tablet Take 1 tablet (400 mg total) by mouth daily. 100 tablet 0   medroxyPROGESTERone (DEPO-PROVERA) 150 MG/ML injection Inject 150 mg into the muscle every 3 (three) months.     Multiple Vitamins-Minerals (MULTIVITAMIN WOMEN PO) Take by mouth.     mycophenolate (MYFORTIC) 180 MG EC tablet Take 360 mg by mouth 2 (two) times daily.     omeprazole (PRILOSEC) 40 MG capsule Take 40 mg by mouth 2 (two) times daily before a meal.     predniSONE (DELTASONE) 5 MG tablet Take 5 mg by mouth daily.     tacrolimus (PROGRAF) 1 MG capsule Take 2 mg by mouth 2 (two) times daily.     No current facility-administered medications for this visit.    PAST MEDICAL HISTORY: Past Medical History:  Diagnosis Date   Anemia    pt states this was pre transplant   Depression    Dysuria 11/04/2018   ESRD (end stage renal disease) (HCC)    from IgA nephritis   GERD (gastroesophageal reflux disease)    Glomerulonephritis    Hemodialysis patient (HCC)    Hypertension    pt states she has not had HTN since her transplant   Hypothyroidism    Menorrhagia with regular cycle 11/04/2018   Normocytic anemia 08/02/2015   PONV (postoperative nausea and vomiting)    was on dialysis i during pregnancy2014   Swelling  of right side of face 11/01/2017   Thrombocytopenia (HCC)    07/2015 in setting of sepsis    PAST SURGICAL HISTORY: Past Surgical History:  Procedure Laterality Date   A/V FISTULAGRAM N/A 01/08/2018   Procedure: A/V FISTULAGRAM - right arm;  Surgeon: Maeola Harman, MD;  Location: [facility] INVASIVE CV LAB;  Service: Cardiovascular;  Laterality: N/A;   AV FISTULA PLACEMENT Left    AV FISTULA PLACEMENT Right 04/14/2015   Procedure: CREATION OF RIGHT RADIOCEPHALIC ARTERIOVENOUS (AV) FISTULA ;  Surgeon: Chuck Hint, MD;  [facility] OR;  Service: Vascular;  Laterality: Right;   AV FISTULA PLACEMENT Right 11/22/2016   Procedure: ARTERIOVENOUS (AV) FISTULA CREATION;  Surgeon: Maeola Harman, MD;  Location[facility] OR;  Service: Vascular;  Laterality: Right;   CESAREAN SECTION     2009   ESOPHAGOGASTRODUODENOSCOPY N/A 11/24/2016   Procedure: ESOPHAGOGASTRODUODENOSCOPY (EGD);  Surgeon: Meryl Dare, MD;  Location[facility] ENDOSCOPY;  Service: Endoscopy;  Laterality: N/A;   EXCHANGE OF A  DIALYSIS CATHETER Right 04/14/2015   Procedure: EXCHANGE OF RIGHT INTERNAL JUGULAR DIALYSIS CATHETER;  Surgeon: Chuck Hint, MD;  Location: [facility] OR;  Service: Vascular;  Laterality: Right;   INSERTION OF DIALYSIS CATHETER Left 11/22/2016   Procedure: INSERTION OF DIALYSIS CATHETER - LEFT INTERNAL JUGULAR PLACEMENT;  Surgeon: Maeola Harman, MD;  Location: [facility] OR;  Service: Vascular;  Laterality: Left;   IR AV DIALY SHUNT INTRO NEEDLE/INTRAC INITIAL W/PTA/STENT/IMG RT Right 02/15/2020   IR AV DIALY SHUNT INTRO NEEDLE/INTRACATH INITIAL W/PTA/IMG LEFT  10/18/2020   IR DIALY SHUNT INTRO NEEDLE/INTRACATH INITIAL W/IMG RIGHT Right 07/26/2017   IR DIALY SHUNT INTRO NEEDLE/INTRACATH INITIAL W/IMG RIGHT Right 10/18/2017   IR DIALY SHUNT INTRO NEEDLE/INTRACATH INITIAL W/IMG RIGHT Right 03/10/2018   IR DIALY SHUNT INTRO NEEDLE/INTRACATH INITIAL W/IMG RIGHT Right 12/30/2018   IR PTA ADDL CENTRAL  DIALYSIS SEG THRU DIALY CIRCUIT LEFT Left 10/18/2020   IR RADIOLOGIST EVAL & MGMT  07/02/2019   IR REMOVAL TUN CV CATH W/O FL  03/06/2017   IR TRANSCATH PLC STENT 1ST ART NOT LE CV CAR VERT CAR  12/30/2018   IR US GUIDE VASC ACCESS RIGHT  12/30/2018   IR US GUIDE VASC ACCESS RIGHT  12/30/2018   IR US GUIDE VASC ACCESS RIGHT  02/15/2020   IR VENO/EXT/UNI LEFT  12/30/2018   LIGATION OF ARTERIOVENOUS  FISTULA Left 11/05/2014   Procedure: LIGATION OF LEFT ARM  BRACHIO-CEPHALIC ARTERIOVENOUS  FISTULA ,& REPAIR OF BRACHIAL ARTERY.;  Surgeon: Pryor Ochoa, MD;  Location[facility] OR;  Service: Vascular;  Laterality: Left;   LIGATION OF ARTERIOVENOUS  FISTULA Right 10/11/2023   Procedure: EXCISION AND LIGATION OF RIGHT ARM ARTERIOVENOUS  FISTULA;  Surgeon: Maeola Harman, MD;  Location: [facility] OR;  Service: Vascular;  Laterality: Right;   PARATHYROIDECTOMY N/A 11/09/2021   Procedure: TOTAL PARATHYROIDECTOMY;  Surgeon: Darnell Level, MD;  Location: [facility] OR;  Service: General;  Laterality: N/A;   PERIPHERAL VASCULAR BALLOON ANGIOPLASTY  01/08/2018   Procedure: PERIPHERAL VASCULAR BALLOON ANGIOPLASTY;  Surgeon: Maeola Harman, MD;  Location: [facility] INVASIVE CV LAB;  Service: Cardiovascular;;  innominate vein   THROMBECTOMY W/ EMBOLECTOMY Right 11/16/2016   Procedure: THROMBECTOMY REVISION OF ARTERIOVENOUS FISTULA - RIGHT ARM;  Surgeon: Larina Earthly, MD;  Location: [facility] OR;  Service: Vascular;  Laterality: Right;   TUBAL LIGATION  2014    FAMILY HISTORY: Family History  Problem Relation Age of Onset   Other Mother        no medical problems per patient   Other Father        no medical problems per patient   Colon cancer Neg Hx    Liver cancer Neg Hx    Stomach cancer Neg Hx     SOCIAL HISTORY: Social History   Socioeconomic History   Marital status: Married    Spouse name: Not on file   Number of children: 3   Years of education: Not on file   Highest education level: 4th grade  Occupational  History   Occupation: unemployed  Tobacco Use   Smoking status: Never   Smokeless tobacco: Never  Vaping Use   Vaping status: Never Used  Substance and Sexual Activity   Alcohol use: No    Alcohol/week: 0.0 standard drinks of alcohol   Drug use: No   Sexual activity: Not Currently    Partners: Male    Birth control/protection: Surgical  Other Topics Concern   Not on file  Social History Narrative  As of February 2018 she has 3 children with whom she lives they are aged 74, 70 and 2.    Social Drivers of Health   Financial Resource Strain: Medium Risk (09/05/2023)   Overall Financial Resource Strain (CARDIA)    Difficulty of Paying Living Expenses: Somewhat hard  Food Insecurity: Unknown (09/05/2023)   Hunger Vital Sign    Worried About Running Out of Food in the Last Year: Never true    Ran Out of Food in the Last Year: Patient declined  Transportation Needs: No Transportation Needs (09/05/2023)   PRAPARE - Administrator, Civil Service (Medical): No    Lack of Transportation (Non-Medical): No  Physical Activity: Sufficiently Active (09/05/2023)   Exercise Vital Sign    Days of Exercise per Week: 7 days    Minutes of Exercise per Session: 30 min  Stress: No Stress Concern Present (09/05/2023)   [employer] of Occupational Health - Occupational Stress Questionnaire    Feeling of Stress : Only a little  Social Connections: Unknown (09/05/2023)   Social Connection and Isolation Panel [NHANES]    Frequency of Communication with Friends and Family: Patient declined    Frequency of Social Gatherings with Friends and Family: More than three times a week    Attends Religious Services: Patient declined    Database administrator or Organizations: No    Attends Engineer, structural: Not on file    Marital Status: Married  Catering manager Violence: Not on file      Levert Feinstein, M.D. Ph.D.  [facility] [address], [address] [place], [place] [address] Ph: [phone] Fax: [phone]  CC:  Bufford Buttner, MD [address] [place],  [place] [address]  Marcine Matar, MD

## 2024-01-21 NOTE — Patient Instructions (Signed)
 Meds ordered this encounter  Medications   propranolol ER (INDERAL LA) 60 MG 24 hr capsule    Sig: Take 1 capsule (60 mg total) by mouth daily.    Dispense:  30 capsule    Refill:  11   SUMAtriptan (IMITREX) 50 MG tablet    Sig: May repeat in 2 hours if headache persists or recurs.    Dispense:  10 tablet    Refill:  6

## 2024-01-22 ENCOUNTER — Other Ambulatory Visit: Payer: Self-pay

## 2024-01-22 MED ORDER — SUMATRIPTAN SUCCINATE 50 MG PO TABS: ORAL_TABLET | ORAL | 6 refills | Status: DC

## 2024-01-23 ENCOUNTER — Other Ambulatory Visit: Payer: Self-pay

## 2024-01-23 ENCOUNTER — Ambulatory Visit: Payer: BC Managed Care – PPO | Admitting: Physician Assistant

## 2024-01-23 ENCOUNTER — Ambulatory Visit

## 2024-01-23 MED ORDER — SUMATRIPTAN SUCCINATE 50 MG PO TABS: 50.0000 mg | ORAL_TABLET | Freq: Every day | ORAL | 6 refills | Status: DC | PRN

## 2024-02-18 ENCOUNTER — Ambulatory Visit: Admitting: Obstetrics and Gynecology

## 2024-03-02 ENCOUNTER — Encounter: Payer: Self-pay | Admitting: Neurology

## 2024-03-05 ENCOUNTER — Ambulatory Visit: Payer: BC Managed Care – PPO | Admitting: Internal Medicine

## 2024-03-11 ENCOUNTER — Ambulatory Visit
Admission: RE | Admit: 2024-03-11 | Discharge: 2024-03-11 | Disposition: A | Discharge status: Home / Self Care | Source: Ambulatory Visit | Attending: Neurology | Admitting: Neurology

## 2024-03-11 DIAGNOSIS — Z940 Kidney transplant status: Secondary | ICD-10-CM

## 2024-03-11 DIAGNOSIS — G43709 Chronic migraine without aura, not intractable, without status migrainosus: Secondary | ICD-10-CM

## 2024-03-17 ENCOUNTER — Ambulatory Visit: Payer: Self-pay | Admitting: Neurology

## 2024-03-19 ENCOUNTER — Other Ambulatory Visit: Payer: Self-pay

## 2024-03-19 ENCOUNTER — Ambulatory Visit: Admitting: Obstetrics and Gynecology

## 2024-05-08 ENCOUNTER — Ambulatory Visit: Admitting: Obstetrics and Gynecology

## 2024-06-09 ENCOUNTER — Other Ambulatory Visit: Payer: Self-pay | Admitting: Internal Medicine

## 2024-07-02 ENCOUNTER — Encounter: Payer: Self-pay | Admitting: Obstetrics and Gynecology

## 2024-07-02 ENCOUNTER — Other Ambulatory Visit (HOSPITAL_COMMUNITY)
Admission: RE | Admit: 2024-07-02 | Discharge: 2024-07-02 | Disposition: A | Discharge status: Home / Self Care | Source: Ambulatory Visit | Attending: Obstetrics and Gynecology | Admitting: Obstetrics and Gynecology

## 2024-07-02 ENCOUNTER — Other Ambulatory Visit: Payer: Self-pay

## 2024-07-02 ENCOUNTER — Ambulatory Visit: Admitting: Obstetrics and Gynecology

## 2024-07-02 VITALS — BP 128/83 | HR 84 | Wt 118.1 lb

## 2024-07-02 DIAGNOSIS — R87612 Low grade squamous intraepithelial lesion on cytologic smear of cervix (LGSIL): Secondary | ICD-10-CM | POA: Diagnosis present

## 2024-07-02 DIAGNOSIS — N941 Unspecified dyspareunia: Secondary | ICD-10-CM | POA: Diagnosis not present

## 2024-07-02 DIAGNOSIS — G8929 Other chronic pain: Secondary | ICD-10-CM | POA: Diagnosis not present

## 2024-07-02 DIAGNOSIS — R102 Pelvic and perineal pain: Secondary | ICD-10-CM | POA: Diagnosis not present

## 2024-07-02 DIAGNOSIS — Z1331 Encounter for screening for depression: Secondary | ICD-10-CM

## 2024-07-02 DIAGNOSIS — Z01419 Encounter for gynecological examination (general) (routine) without abnormal findings: Secondary | ICD-10-CM

## 2024-07-02 MED ORDER — BACLOFEN 10 MG PO TABS: 10.0000 mg | ORAL_TABLET | Freq: Two times a day (BID) | ORAL | 0 refills | Status: DC | PRN

## 2024-07-02 NOTE — Progress Notes (Signed)
 ANNUAL EXAM Patient name: Karla Garner MRN [id number]  Date of birth: 1986/06/28 Chief Complaint:   Gynecologic Exam  History of Present Illness:   Karla Garner is a 38 y.o. [phone] being seen today for a routine annual exam.  Current complaints: repeat pap, pelvic pain  Menstrual concerns? No  has had 2 menses since stopping depo and they were fine Breast or nipple changes? No  Contraception use? Yes s/p TL Sexually active? Yes pain with intercourse  Notes having pelvic pain after prolonged standing or walking. Has been having lower abdominal pain that worsens with the work day, daily and subsequent burning sensation in the vagina and vulva.  Typically starts as pressure in the lower abdomen and then progresses to sensation in the vagina. No skin changes. Nothing hanging from the vagina. Has been occurring daily at work. Previously been referred for pelvic physical therapy.   No LMP recorded. Patient has had an injection.   The pregnancy intention screening data noted above was reviewed. Potential methods of contraception were discussed. The patient elected to proceed with No data recorded.   Last pap     Component Value Date/Time   DIAGPAP (A) 12/18/2022 1736    - Atypical squamous cells of undetermined significance (ASC-US )   DIAGPAP - Low grade squamous intraepithelial lesion (LSIL) (A) 10/17/2021 1137   DIAGPAP  11/19/2017 0000    NEGATIVE FOR INTRAEPITHELIAL LESIONS OR MALIGNANCY.   HPVHIGH Negative 12/18/2022 1736   HPVHIGH Negative 10/17/2021 1137   ADEQPAP  12/18/2022 1736    Satisfactory for evaluation; transformation zone component PRESENT.   ADEQPAP  10/17/2021 1137    Satisfactory for evaluation; transformation zone component PRESENT.   ADEQPAP  11/19/2017 0000    Satisfactory for evaluation  endocervical/transformation zone component PRESENT.   Last mammogram: n/a.  Last colonoscopy: n/a.      08/30/2022   10:22 AM 03/21/2022    3:16 PM  12/12/2021    1:46 PM 10/17/2021   10:18 AM 12/03/2018    4:08 PM  Depression screen PHQ 2/9  Decreased Interest 0 3 0 1 3  Down, Depressed, Hopeless 0 2 0 1 1  PHQ - 2 Score 0 5 0 2 4  Altered sleeping 0 2  1 3   Tired, decreased energy 0 2  1 3   Change in appetite 0 3  1 3   Feeling bad or failure about yourself  0 0  0 3  Trouble concentrating 1 1  1 3   Moving slowly or fidgety/restless 0 1  0 2  Suicidal thoughts 0 0  0 0  PHQ-9 Score 1 14  6 21         08/30/2022   10:22 AM 03/21/2022    3:16 PM 12/12/2021    1:46 PM 12/03/2018    4:08 PM  GAD 7 : Generalized Anxiety Score  Nervous, Anxious, on Edge 1 3 1 3   Control/stop worrying 0 2 0 1  Worry too much - different things 0 3 1 2   Trouble relaxing 1 3 1 2   Restless 0  0 1  Easily annoyed or irritable 0  0 2  Afraid - awful might happen 0  0 2  Total GAD 7 Score 2  3 13      Review of Systems:   Pertinent items are noted in HPI Denies any headaches, blurred vision, fatigue, shortness of breath, chest pain, abdominal pain, abnormal vaginal discharge/itching/odor/irritation, problems with periods, bowel movements, urination, or intercourse unless  otherwise stated above. Pertinent History Reviewed:  Reviewed past medical,surgical, social and family history.  Reviewed problem list, medications and allergies. Physical Assessment:   Vitals:   07/02/24 0955  BP: (!) 131/97  Pulse: 80  Weight: 118 lb 1.6 oz (53.6 kg)  Body mass index is 23.85 kg/m.        Physical Examination:   General appearance - well appearing, and in no distress  Mental status - alert, oriented to person, place, and time  Psych:  She has a normal mood and affect  Skin - warm and dry, normal color, no suspicious lesions noted  Chest - effort normal, all lung fields clear to auscultation bilaterally  Heart - normal rate and regular rhythm  Breasts - breasts appear normal, no suspicious masses, no skin or nipple changes or  axillary nodes  Abdomen -  soft, nontender, nondistended, no masses or organomegaly  Pelvic -  VULVA: normal appearing vulva with no masses, tenderness or lesions   VAGINA: normal appearing vagina with normal color and discharge, no lesions   CERVIX: normal appearing cervix without discharge or lesions, no CMT  Thin prep pap is done with HR HPV cotesting  Tender levator ani and ischicoccygeous bilaterally   Extremities:  No swelling or varicosities noted  Chaperone present for exam  No results found for this or any previous visit (from the past 24 hours).    Assessment & Plan:  1. Well woman exam with routine gynecological exam (Primary) - Cervical cancer screening: Discussed guidelines. Pap with HPV collected - Birth Control: hx of TL - Breast Health: Encouraged self breast awareness/SBE. Teaching provided.  - F/U 12 months and prn   2. Low grade squamous intraepithelial lesion on cytologic smear of cervix (LGSIL) May be having slightly persistent slightly abnormal pap due to immunocompromised state. No HPV present, which is reassuring.  - Cytology - PAP  3. Dyspareunia in female 4. Chronic pelvic pain in female Pelvic myalgia on exam with reproduction of pain with palpation with examination. Referral to PFPT and trial of muscle relaxer. Noted possible side effect of being sleepy. Additionally, advise discussion with nephrologist given hx of tranasplanted kidney.  - baclofen  (LIORESAL ) 10 MG tablet; Take 1 tablet (10 mg total) by mouth 2 (two) times daily as needed for muscle spasms.  Dispense: 30 each; Refill: 0 - Ambulatory referral to Physical Therapy  Orders Placed This Encounter  Procedures   Ambulatory referral to Physical Therapy    Meds:  Meds ordered this encounter  Medications   baclofen  (LIORESAL ) 10 MG tablet    Sig: Take 1 tablet (10 mg total) by mouth 2 (two) times daily as needed for muscle spasms.    Dispense:  30 each    Refill:  0    Follow-up: No follow-ups on  file.  Carter Quarry, MD 07/02/2024 10:03 AM

## 2024-07-06 LAB — CYTOLOGY - PAP
Comment: NEGATIVE
High risk HPV: NEGATIVE

## 2024-07-27 ENCOUNTER — Telehealth: Payer: Self-pay | Admitting: *Deleted

## 2024-07-27 NOTE — Telephone Encounter (Deleted)
-----   Message from Carter Quarry sent at 07/27/2024  3:29 PM EDT ----- Regarding: RE: abnormal pap results Thank you for catching this, patient needs a colposcopy ----- Message ----- From: Skip Rock CROME, RN Sent: 07/15/2024   3:30 PM EDT To: Carter Quarry, MD Subject: abnormal pap results                           Dr. Quarry or Dr. Cleotilde In reviewing labs found she had abnormal pap. Can you send message back to clinical pool with plan ? Does she need pap or? Shirlette Scarber,RN

## 2024-07-27 NOTE — Telephone Encounter (Signed)
 I called patient with Interpreter [name] and informed her of results and recommendation. I explained what a colposcopy is and that front office will call her with an appointment. She voices understanding.  Rock Skip PEAK

## 2024-08-04 NOTE — Patient Instructions (Signed)
 Below is our plan:  We will continue to monitor  Please make sure you are staying well hydrated. I recommend 50-60 ounces daily. Well balanced diet and regular exercise encouraged. Consistent sleep schedule with 6-8 hours recommended.   Please continue follow up with care team as directed.   Follow up with me as needed   You may receive a survey regarding today's visit. I encourage you to leave honest feed back as I do use this information to improve patient care. Thank you for seeing me today!   GENERAL HEADACHE INFORMATION:   Natural supplements: Magnesium  Oxide or Magnesium  Glycinate 500 mg at bed (up to 800 mg daily) Coenzyme Q10 300 mg in AM Vitamin B2- 200 mg twice a day   Add 1 supplement at a time since even natural supplements can have undesirable side effects. You can sometimes buy supplements cheaper (especially Coenzyme Q10) at www.WebmailGuide.co.za or at [facility].  Migraine with aura: There is increased risk for stroke in women with migraine with aura and a contraindication for the combined contraceptive pill for use by women who have migraine with aura. The risk for women with migraine without aura is lower. However other risk factors like smoking are far more likely to increase stroke risk than migraine. There is a recommendation for no smoking and for the use of OCPs without estrogen such as progestogen only pills particularly for women with migraine with aura.SABRA People who have migraine headaches with auras may be 3 times more likely to have a stroke caused by a blood clot, compared to migraine patients who don't see auras. Women who take hormone-replacement therapy may be 30 percent more likely to suffer a clot-based stroke than women not taking medication containing estrogen. Other risk factors like smoking and high blood pressure may be  much more important.    Vitamins and herbs that show potential:   Magnesium : Magnesium  (250 mg twice a day or 500 mg at bed) has a relaxant  effect on smooth muscles such as blood vessels. Individuals suffering from frequent or daily headache usually have low magnesium  levels which can be increase with daily supplementation of 400-750 mg. Three trials found 40-90% average headache reduction  when used as a preventative. Magnesium  may help with headaches are aura, the best evidence for magnesium  is for migraine with aura is its thought to stop the cortical spreading depression we believe is the pathophysiology of migraine aura.Magnesium  also demonstrated the benefit in menstrually related migraine.  Magnesium  is part of the messenger system in the serotonin cascade and it is a good muscle relaxant.  It is also useful for constipation which can be a side effect of other medications used to treat migraine. Good sources include nuts, whole grains, and tomatoes. Side Effects: loose stool/diarrhea  Riboflavin (vitamin B 2) 200 mg twice a day. This vitamin assists nerve cells in the production of ATP a principal energy storing molecule.  It is necessary for many chemical reactions in the body.  There have been at least 3 clinical trials of riboflavin using 400 mg per day all of which suggested that migraine frequency can be decreased.  All 3 trials showed significant improvement in over half of migraine sufferers.  The supplement is found in bread, cereal, milk, meat, and poultry.  Most Americans get more riboflavin than the recommended daily allowance, however riboflavin deficiency is not necessary for the supplements to help prevent headache. Side effects: energizing, green urine   Coenzyme Q10: This is present in almost  all cells in the body and is critical component for the conversion of energy.  Recent studies have shown that a nutritional supplement of CoQ10 can reduce the frequency of migraine attacks by improving the energy production of cells as with riboflavin.  Doses of 150 mg twice a day have been shown to be effective.   Melatonin: Increasing  evidence shows correlation between melatonin secretion and headache conditions.  Melatonin supplementation has decreased headache intensity and duration.  It is widely used as a sleep aid.  Sleep is natures way of dealing with migraine.  A dose of 3 mg is recommended to start for headaches including cluster headache. Higher doses up to 15 mg has been reviewed for use in Cluster headache and have been used. The rationale behind using melatonin for cluster is that many theories regarding the cause of Cluster headache center around the disruption of the normal circadian rhythm in the brain.  This helps restore the normal circadian rhythm.   HEADACHE DIET: Foods and beverages which may trigger migraine Note that only 20% of headache patients are food sensitive. You will know if you are food sensitive if you get a headache consistently 20 minutes to 2 hours after eating a certain food. Only cut out a food if it causes headaches, otherwise you might remove foods you enjoy! What matters most for diet is to eat a well balanced healthy diet full of vegetables and low fat protein, and to not miss meals.   Chocolate, other sweets ALL cheeses except cottage and cream cheese Dairy products, yogurt, sour cream, ice cream Liver Meat extracts (Bovril, Marmite, meat tenderizers) Meats or fish which have undergone aging, fermenting, pickling or smoking. These include: Hotdogs,salami,Lox,sausage, mortadellas,smoked salmon, pepperoni, Pickled herring Pods of broad bean (English beans, Chinese pea pods, [place] (fava) beans, lima and navy beans Ripe avocado, ripe banana Yeast extracts or active yeast preparations such as Brewer's or Fleishman's (commercial bakes goods are permitted) Tomato based foods, pizza (lasagna, etc.)   MSG (monosodium glutamate) is disguised as many things; look for these common aliases: Monopotassium glutamate Autolysed yeast Hydrolysed protein Sodium caseinate "flavorings" "all natural  preservatives Nutrasweet   Avoid all other foods that convincingly provoke headaches.   Resources: The Dizzy Bluford Aid Your Headache Diet, migrainestrong.com  https://zamora-andrews.com/   Caffeine and Migraine For patients that have migraine, caffeine intake more than 3 days per week can lead to dependency and increased migraine frequency. I would recommend cutting back on your caffeine intake as best you can. The recommended amount of caffeine is 200-300 mg daily, although migraine patients may experience dependency at even lower doses. While you may notice an increase in headache temporarily, cutting back will be helpful for headaches in the long run. For more information on caffeine and migraine, visit: https://americanmigrainefoundation.org/resource-library/caffeine-and-migraine/   Headache Prevention Strategies:   1. Maintain a headache diary; learn to identify and avoid triggers.  - This can be a simple note where you log when you had a headache, associated symptoms, and medications used - There are several smartphone apps developed to help track migraines: Migraine Buddy, Migraine Monitor, Curelator N1-Headache App   Common triggers include: Emotional triggers: Emotional/Upset family or friends Emotional/Upset occupation Business reversal/success Anticipation anxiety Crisis-serious Post-crisis periodNew job/position   Physical triggers: Vacation Day Weekend Strenuous Exercise High Altitude Location New Move Menstrual Day Physical Illness Oversleep/Not enough sleep Weather changes Light: Photophobia or light sesnitivity treatment involves a balance between desensitization and reduction in overly strong input. Use dark polarized  glasses outside, but not inside. Avoid bright or fluorescent light, but do not dim environment to the point that going into a normally lit room hurts. Consider FL-41 tint lenses, which reduce the most  irritating wavelengths without blocking too much light.  These can be obtained at axonoptics.com or theraspecs.com Foods: see list above.   2. Limit use of acute treatments (over-the-counter medications, triptans, etc.) to no more than 2 days per week or 10 days per month to prevent medication overuse headache (rebound headache).     3. Follow a regular schedule (including weekends and holidays): Don't skip meals. Eat a balanced diet. 8 hours of sleep nightly. Minimize stress. Exercise 30 minutes per day. Being overweight is associated with a 5 times increased risk of chronic migraine. Keep well hydrated and drink 6-8 glasses of water per day.   4. Initiate non-pharmacologic measures at the earliest onset of your headache. Rest and quiet environment. Relax and reduce stress. Breathe2Relax is a free app that can instruct you on    some simple relaxtion and breathing techniques. Http://Dawnbuse.com is a    free website that provides teaching videos on relaxation.  Also, there are  many apps that   can be downloaded for "mindful" relaxation.  An app called YOGA NIDRA will help walk you through mindfulness. Another app called Calm can be downloaded to give you a structured mindfulness guide with daily reminders and skill development. Headspace for guided meditation Mindfulness Based Stress Reduction Online Course: www.palousemindfulness.com Cold compresses.   5. Don't wait!! Take the maximum allowable dosage of prescribed medication at the first sign of migraine.   6. Compliance:  Take prescribed medication regularly as directed and at the first sign of a migraine.   7. Communicate:  Call your physician when problems arise, especially if your headaches change, increase in frequency/severity, or become associated with neurological symptoms (weakness, numbness, slurred speech, etc.). Proceed to emergency room if you experience new or worsening symptoms or symptoms do not resolve, if you have new  neurologic symptoms or if headache is severe, or for any concerning symptom.   8. Headache/pain management therapies: Consider various complementary methods, including medication, behavioral therapy, psychological counselling, biofeedback, massage therapy, acupuncture, dry needling, and other modalities.  Such measures may reduce the need for medications. Counseling for pain management, where patients learn to function and ignore/minimize their pain, seems to work very well.   9. Recommend changing family's attention and focus away from patient's headaches. Instead, emphasize daily activities. If first question of day is 'How are your headaches/Do you have a headache today?', then patient will constantly think about headaches, thus making them worse. Goal is to re-direct attention away from headaches, toward daily activities and other distractions.   10. Helpful Websites: www.AmericanHeadacheSociety.org PatentHood.ch www.headaches.org TightMarket.nl www.achenet.org

## 2024-08-04 NOTE — Progress Notes (Unsigned)
 No chief complaint on file.   HISTORY OF PRESENT ILLNESS:  08/04/24 ALL:  Karla Garner is a 38 y.o. female here today for follow up for  migraines. She was seen by Dr Onita 01/2024. MRI was normal. Propranolol  was started for prevention. Since,   Afrin?   HISTORY (copied from Dr Georgianne previous note)  Karla Garner is a 38 year old female, seen in request by  Dr.  Vicci, Barnie B, for evaluation of chronic migraine headache, initial evaluation was on January 21, 2024 with interpreter, [place] Hispanic speaking   History is obtained from the patient and review of electronic medical records. I personally reviewed pertinent available imaging films in PACS.    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, kidney transplant in August 2023, postsurgically, she began to experience frequent headaches   She already has long history of intermittent headache, nasal congestion feelings, has been using at least twice Afrin nasal spray over the past 10 years, described difficulty breathing through her nose if she does not use the spray   Since August 2023, she complains of increased headaches, triggered by neck movement, sometimes intense pounding headache can last for less than 30 minutes, relieved by resting, closing her eyes, drink tea,   She currently working cleaning houses, denied difficulty   Laboratory evaluation in October 2024, tacrolimus level 7.1, normal CMP creatinine of 0.78, hemoglobin of 16.1   REVIEW OF SYSTEMS: Out of a complete 14 system review of symptoms, the patient complains only of the following symptoms, and all other reviewed systems are negative.   ALLERGIES: No Known Allergies   HOME MEDICATIONS: Outpatient Medications Prior to Visit  Medication Sig Dispense Refill    aspirin  81 MG chewable tablet Chew 81 mg by mouth daily.     B Complex-C-Zn-Folic Acid  (DIALYVITE 800-ZINC 15) 0.8 MG TABS Take 1 tablet by mouth daily.     baclofen  (LIORESAL ) 10 MG tablet Take 1 tablet (10 mg total) by mouth 2 (two) times daily as needed for muscle spasms. 30 each 0   Cholecalciferol (VITAMIN D-3) 125 MCG (5000 UT) TABS Take 5,000 Units by mouth daily.     doxycycline  (VIBRA -TABS) 100 MG tablet Take 1 tablet (100 mg total) by mouth 2 (two) times daily. (Patient not taking: Reported on 07/02/2024) 14 tablet 0   HYDROcodone -acetaminophen  (NORCO) 5-325 MG tablet Take 1 tablet by mouth every 6 (six) hours as needed for moderate pain (pain score 4-6). (Patient not taking: Reported on 07/02/2024) 15 tablet 0   levothyroxine  (SYNTHROID ) 75 MCG tablet Take 1 tablet (75 mcg total) by mouth Daily at 0600. 30 tablet 2   magnesium  oxide (MAG-OX) 400 (240 Mg) MG tablet Take 1 tablet (400 mg total) by mouth daily. 100 tablet 0   medroxyPROGESTERone  (DEPO-PROVERA ) 150 MG/ML injection Inject 150 mg into the muscle every 3 (three) months. (Patient not taking: Reported on 07/02/2024)     Multiple Vitamins-Minerals (MULTIVITAMIN WOMEN PO) Take by mouth.     mycophenolate (MYFORTIC) 180 MG EC tablet Take 360 mg by mouth 2 (two) times daily.     omeprazole (PRILOSEC) 40 MG capsule Take 40 mg by mouth 2 (two) times daily before a meal.     predniSONE (DELTASONE) 5 MG tablet Take 5 mg by mouth daily.     propranolol  ER (  INDERAL  LA) 60 MG 24 hr capsule Take 1 capsule (60 mg total) by mouth daily. 30 capsule 11   sulfamethoxazole-trimethoprim (BACTRIM) 400-80 MG tablet 1 tablet by mouth every Monday, Wednesday, Friday     SUMAtriptan  (IMITREX ) 50 MG tablet Take 1 tablet (50 mg total) by mouth daily as needed for migraine. May repeat in 2 hours if headache persists or recurs.not to exceed 2 tablets in 24 hours.May repeat in 2 hours if headache persists or recurs.not to exceed 2 tablets in 24 hours. 10 tablet 6    tacrolimus (PROGRAF) 1 MG capsule Take 2 mg by mouth 2 (two) times daily.     No facility-administered medications prior to visit.     PAST MEDICAL HISTORY: Past Medical History:  Diagnosis Date   Anemia    pt states this was pre transplant   Depression    Dysuria 11/04/2018   ESRD (end stage renal disease) (HCC)    from IgA nephritis   GERD (gastroesophageal reflux disease)    Glomerulonephritis    Hemodialysis patient    Hypertension    pt states she has not had HTN since her transplant   Hypothyroidism    Menorrhagia with regular cycle 11/04/2018   Normocytic anemia 08/02/2015   PONV (postoperative nausea and vomiting)    was on dialysis i during pregnancy2014   Swelling of right side of face 11/01/2017   Thrombocytopenia    07/2015 in setting of sepsis     PAST SURGICAL HISTORY: Past Surgical History:  Procedure Laterality Date   A/V FISTULAGRAM N/A 01/08/2018   Procedure: A/V FISTULAGRAM - right arm;  Surgeon: Sheree Penne Bruckner, MD;  Location: [facility] INVASIVE CV LAB;  Service: Cardiovascular;  Laterality: N/A;   AV FISTULA PLACEMENT Left    AV FISTULA PLACEMENT Right 04/14/2015   Procedure: CREATION OF RIGHT RADIOCEPHALIC ARTERIOVENOUS (AV) FISTULA ;  Surgeon: Bruckner GORMAN Blade, MD;  Location: [facility] OR;  Service: Vascular;  Laterality: Right;   AV FISTULA PLACEMENT Right 11/22/2016   Procedure: ARTERIOVENOUS (AV) FISTULA CREATION;  Surgeon: Penne Bruckner Sheree, MD;  Location: [facility] OR;  Service: Vascular;  Laterality: Right;   CESAREAN SECTION     2009   ESOPHAGOGASTRODUODENOSCOPY N/A 11/24/2016   Procedure: ESOPHAGOGASTRODUODENOSCOPY (EGD);  Surgeon: Gwendlyn ONEIDA Buddy, MD;  Location: [facility] ENDOSCOPY;  Service: Endoscopy;  Laterality: N/A;   EXCHANGE OF A DIALYSIS CATHETER Right 04/14/2015   Procedure: EXCHANGE OF RIGHT INTERNAL JUGULAR DIALYSIS CATHETER;  Surgeon: Bruckner GORMAN Blade, MD;  Location: [facility] OR;  Service: Vascular;  Laterality: Right;   INSERTION OF  DIALYSIS CATHETER Left 11/22/2016   Procedure: INSERTION OF DIALYSIS CATHETER - LEFT INTERNAL JUGULAR PLACEMENT;  Surgeon: Penne Bruckner Sheree, MD;  Location: [facility] OR;  Service: Vascular;  Laterality: Left;   IR AV DIALY SHUNT INTRO NEEDLE/INTRAC INITIAL W/PTA/STENT/IMG RT Right 02/15/2020   IR AV DIALY SHUNT INTRO NEEDLE/INTRACATH INITIAL W/PTA/IMG LEFT  10/18/2020   IR DIALY SHUNT INTRO NEEDLE/INTRACATH INITIAL W/IMG RIGHT Right 07/26/2017   IR DIALY SHUNT INTRO NEEDLE/INTRACATH INITIAL W/IMG RIGHT Right 10/18/2017   IR DIALY SHUNT INTRO NEEDLE/INTRACATH INITIAL W/IMG RIGHT Right 03/10/2018   IR DIALY SHUNT INTRO NEEDLE/INTRACATH INITIAL W/IMG RIGHT Right 12/30/2018   IR PTA ADDL CENTRAL DIALYSIS SEG THRU DIALY CIRCUIT LEFT Left 10/18/2020   IR RADIOLOGIST EVAL & MGMT  07/02/2019   IR REMOVAL TUN CV CATH W/O FL  03/06/2017   IR TRANSCATH PLC STENT 1ST ART NOT LE CV CAR VERT CAR  12/30/2018  IR US  GUIDE VASC ACCESS RIGHT  12/30/2018   IR US  GUIDE VASC ACCESS RIGHT  12/30/2018   IR US  GUIDE VASC ACCESS RIGHT  02/15/2020   IR VENO/EXT/UNI LEFT  12/30/2018   LIGATION OF ARTERIOVENOUS  FISTULA Left 11/05/2014   Procedure: LIGATION OF LEFT ARM  BRACHIO-CEPHALIC ARTERIOVENOUS  FISTULA ,& REPAIR OF BRACHIAL ARTERY.;  Surgeon: Lynwood JONETTA Collum, MD;  Location: [facility] OR;  Service: Vascular;  Laterality: Left;   LIGATION OF ARTERIOVENOUS  FISTULA Right 10/11/2023   Procedure: EXCISION AND LIGATION OF RIGHT ARM ARTERIOVENOUS  FISTULA;  Surgeon: Sheree Penne Bruckner, MD;  Location: [facility] OR;  Service: Vascular;  Laterality: Right;   PARATHYROIDECTOMY N/A 11/09/2021   Procedure: TOTAL PARATHYROIDECTOMY;  Surgeon: Eletha Boas, MD;  Location: [facility] OR;  Service: General;  Laterality: N/A;   PERIPHERAL VASCULAR BALLOON ANGIOPLASTY  01/08/2018   Procedure: PERIPHERAL VASCULAR BALLOON ANGIOPLASTY;  Surgeon: Sheree Penne Bruckner, MD;  [facility] INVASIVE CV LAB;  Service: Cardiovascular;;  innominate vein   THROMBECTOMY  W/ EMBOLECTOMY Right 11/16/2016   Procedure: THROMBECTOMY REVISION OF ARTERIOVENOUS FISTULA - RIGHT ARM;  Surgeon: Boas JULIANNA Doing, MD;  Location: [facility] OR;  Service: Vascular;  Laterality: Right;   TUBAL LIGATION  2014     FAMILY HISTORY: Family History  Problem Relation Age of Onset   Other Mother        no medical problems per patient   Other Father        no medical problems per patient   Colon cancer Neg Hx    Liver cancer Neg Hx    Stomach cancer Neg Hx      SOCIAL HISTORY: Social History   Socioeconomic History   Marital status: Married    Spouse name: Not on file   Number of children: 3   Years of education: Not on file   Highest education level: 4th grade  Occupational History   Occupation: unemployed  Tobacco Use   Smoking status: Never   Smokeless tobacco: Never  Vaping Use   Vaping status: Never Used  Substance and Sexual Activity   Alcohol  use: No    Alcohol /week: 0.0 standard drinks of alcohol    Drug use: No   Sexual activity: Not Currently    Partners: Male    Birth control/protection: Surgical  Other Topics Concern   Not on file  Social History Narrative   As of February 2018 she has 3 children with whom she lives they are aged 73, 9 and 2.    Social Drivers of Health   Financial Resource Strain: Medium Risk (09/05/2023)   Overall Financial Resource Strain (CARDIA)    Difficulty of Paying Living Expenses: Somewhat hard  Food Insecurity: Food Insecurity Present (07/02/2024)   Hunger Vital Sign    Worried About Running Out of Food in the Last Year: Never true    Ran Out of Food in the Last Year: Sometimes true  Transportation Needs: Unmet Transportation Needs (07/02/2024)   PRAPARE - Administrator, Civil Service (Medical): Yes    Lack of Transportation (Non-Medical): No  Physical Activity: Sufficiently Active (09/05/2023)   Exercise Vital Sign    Days of Exercise per Week: 7 days    Minutes of Exercise per Session: 30 min  Stress: No  Stress Concern Present (09/05/2023)   [employer] of Occupational Health - Occupational Stress Questionnaire    Feeling of Stress : Only a little  Social Connections: Unknown (09/05/2023)   Social Connection and  Isolation Panel    Frequency of Communication with Friends and Family: Patient declined    Frequency of Social Gatherings with Friends and Family: More than three times a week    Attends Religious Services: Patient declined    Database administrator or Organizations: No    Attends Engineer, structural: Not on file    Marital Status: Married  Catering manager Violence: Not on file     PHYSICAL EXAM  There were no vitals filed for this visit. There is no height or weight on file to calculate BMI.  Generalized: Well developed, in no acute distress  Cardiology: normal rate and rhythm, no murmur auscultated  Respiratory: clear to auscultation bilaterally    Neurological examination  Mentation: Alert oriented to time, place, history taking. Follows all commands speech and language fluent Cranial nerve II-XII: Pupils were equal round reactive to light. Extraocular movements were full, visual field were full on confrontational test. Facial sensation and strength were normal. Uvula tongue midline. Head turning and shoulder shrug  were normal and symmetric. Motor: The motor testing reveals 5 over 5 strength of all 4 extremities. Good symmetric motor tone is noted throughout.  Sensory: Sensory testing is intact to soft touch on all 4 extremities. No evidence of extinction is noted.  Coordination: Cerebellar testing reveals good finger-nose-finger and heel-to-shin bilaterally.  Gait and station: Gait is normal. Tandem gait is normal. Romberg is negative. No drift is seen.  Reflexes: Deep tendon reflexes are symmetric and normal bilaterally.    DIAGNOSTIC DATA (LABS, IMAGING, TESTING) - I reviewed patient records, labs, notes, testing and imaging myself where  available.  Lab Results  Component Value Date   WBC 5.5 03/21/2022   HGB 16.7 (H) 10/11/2023   HCT 49.0 (H) 10/11/2023   MCV 89 03/21/2022   PLT 214 03/21/2022      Component Value Date/Time   NA 140 10/11/2023 0624   K 4.1 10/11/2023 0624   CL 110 10/11/2023 0624   CO2 28 01/11/2022 1729   GLUCOSE 93 10/11/2023 0624   BUN 27 (H) 10/11/2023 0624   CREATININE 0.90 10/11/2023 0624   CREATININE 3.74 (H) 02/16/2013 1221   CALCIUM  9.6 03/21/2022 1529   CALCIUM  8.2 (L) 04/11/2015 1508   PROT 5.8 (L) 11/16/2016 0736   ALBUMIN  3.6 11/14/2021 0058   AST 15 11/16/2016 0736   ALT 11 (L) 11/16/2016 0736   ALKPHOS 64 11/16/2016 0736   BILITOT 0.4 11/16/2016 0736   GFRNONAA 6 (L) 01/11/2022 1729   GFRAA 4 (L) 02/15/2020 0755   Lab Results  Component Value Date   CHOL 163 08/30/2022   HDL 49 08/30/2022   LDLCALC 79 08/30/2022   TRIG 212 (H) 08/30/2022   CHOLHDL 3.3 08/30/2022   No results found for: HGBA1C No results found for: VITAMINB12 Lab Results  Component Value Date   TSH 10.100 (H) 03/21/2022        No data to display               No data to display           ASSESSMENT AND PLAN  38 y.o. year old female  has a past medical history of Anemia, Depression, Dysuria (11/04/2018), ESRD (end stage renal disease) (HCC), GERD (gastroesophageal reflux disease), Glomerulonephritis, Hemodialysis patient, Hypertension, Hypothyroidism, Menorrhagia with regular cycle (11/04/2018), Normocytic anemia (08/02/2015), PONV (postoperative nausea and vomiting), Swelling of right side of face (11/01/2017), and Thrombocytopenia. here with    No diagnosis  found.  Hadassah Dionisio Mad ***.  Healthy lifestyle habits encouraged. *** will follow up with PCP as directed. *** will return to see me in ***, sooner if needed. *** verbalizes understanding and agreement with this plan.   No orders of the defined types were placed in this encounter.    No orders of the defined  types were placed in this encounter.    Greig Forbes, MSN, FNP-C 08/04/2024, 1:00 PM  [facility] [address], [address] [place], [place] [address] [phone]

## 2024-08-05 ENCOUNTER — Ambulatory Visit: Admitting: Family Medicine

## 2024-08-05 ENCOUNTER — Encounter: Payer: Self-pay | Admitting: Family Medicine

## 2024-08-05 VITALS — BP 122/80 | HR 89 | Ht 59.0 in | Wt 120.6 lb

## 2024-08-05 DIAGNOSIS — G43709 Chronic migraine without aura, not intractable, without status migrainosus: Secondary | ICD-10-CM

## 2024-08-27 NOTE — Therapy (Unsigned)
 OUTPATIENT PHYSICAL THERAPY FEMALE PELVIC EVALUATION   Patient Name: Karla Garner MRN: [id number] DOB:Jun 10, 1986, 38 y.o., female Today's Date: 08/28/2024  END OF SESSION:  PT End of Session - 08/28/24 1058     Visit Number 1    Date for Recertification  02/25/25    Authorization Type [employer]    PT Start Time 1100    PT Stop Time 1140    PT Time Calculation (min) 40 min    Activity Tolerance Patient tolerated treatment well    Behavior During Therapy WFL for tasks assessed/performed          Past Medical History:  Diagnosis Date   Anemia    pt states this was pre transplant   Depression    Dysuria 11/04/2018   ESRD (end stage renal disease) (HCC)    from IgA nephritis   GERD (gastroesophageal reflux disease)    Glomerulonephritis    Hemodialysis patient    Hypertension    pt states she has not had HTN since her transplant   Hypothyroidism    Menorrhagia with regular cycle 11/04/2018   Normocytic anemia 08/02/2015   PONV (postoperative nausea and vomiting)    was on dialysis i during pregnancy2014   Swelling of right side of face 11/01/2017   Thrombocytopenia    07/2015 in setting of sepsis   Past Surgical History:  Procedure Laterality Date   A/V FISTULAGRAM N/A 01/08/2018   Procedure: A/V FISTULAGRAM - right arm;  Surgeon: Sheree Penne Bruckner, MD;  Location: [facility] INVASIVE CV LAB;  Service: Cardiovascular;  Laterality: N/A;   AV FISTULA PLACEMENT Left    AV FISTULA PLACEMENT Right 04/14/2015   Procedure: CREATION OF RIGHT RADIOCEPHALIC ARTERIOVENOUS (AV) FISTULA ;  Surgeon: Bruckner GORMAN Blade, MD;  Location: [facility] OR;  Service: Vascular;  Laterality: Right;   AV FISTULA PLACEMENT Right 11/22/2016   Procedure: ARTERIOVENOUS (AV) FISTULA CREATION;  Surgeon: Penne Bruckner Sheree, MD;  Location: [facility] OR;  Service: Vascular;  Laterality: Right;   CESAREAN SECTION     2009   ESOPHAGOGASTRODUODENOSCOPY N/A 11/24/2016   Procedure: ESOPHAGOGASTRODUODENOSCOPY  (EGD);  Surgeon: Gwendlyn ONEIDA Buddy, MD;  Location: [facility] ENDOSCOPY;  Service: Endoscopy;  Laterality: N/A;   EXCHANGE OF A DIALYSIS CATHETER Right 04/14/2015   Procedure: EXCHANGE OF RIGHT INTERNAL JUGULAR DIALYSIS CATHETER;  Surgeon: Bruckner GORMAN Blade, MD;  Location: [facility] OR;  Service: Vascular;  Laterality: Right;   INSERTION OF DIALYSIS CATHETER Left 11/22/2016   Procedure: INSERTION OF DIALYSIS CATHETER - LEFT INTERNAL JUGULAR PLACEMENT;  Surgeon: Penne Bruckner Sheree, MD;  Location: [facility] OR;  Service: Vascular;  Laterality: Left;   IR AV DIALY SHUNT INTRO NEEDLE/INTRAC INITIAL W/PTA/STENT/IMG RT Right 02/15/2020   IR AV DIALY SHUNT INTRO NEEDLE/INTRACATH INITIAL W/PTA/IMG LEFT  10/18/2020   IR DIALY SHUNT INTRO NEEDLE/INTRACATH INITIAL W/IMG RIGHT Right 07/26/2017   IR DIALY SHUNT INTRO NEEDLE/INTRACATH INITIAL W/IMG RIGHT Right 10/18/2017   IR DIALY SHUNT INTRO NEEDLE/INTRACATH INITIAL W/IMG RIGHT Right 03/10/2018   IR DIALY SHUNT INTRO NEEDLE/INTRACATH INITIAL W/IMG RIGHT Right 12/30/2018   IR PTA ADDL CENTRAL DIALYSIS SEG THRU DIALY CIRCUIT LEFT Left 10/18/2020   IR RADIOLOGIST EVAL & MGMT  07/02/2019   IR REMOVAL TUN CV CATH W/O FL  03/06/2017   IR TRANSCATH PLC STENT 1ST ART NOT LE CV CAR VERT CAR  12/30/2018   IR US  GUIDE VASC ACCESS RIGHT  12/30/2018   IR US  GUIDE VASC ACCESS RIGHT  12/30/2018   IR US  GUIDE VASC ACCESS  RIGHT  02/15/2020   IR VENO/EXT/UNI LEFT  12/30/2018   LIGATION OF ARTERIOVENOUS  FISTULA Left 11/05/2014   Procedure: LIGATION OF LEFT ARM  BRACHIO-CEPHALIC ARTERIOVENOUS  FISTULA ,& REPAIR OF BRACHIAL ARTERY.;  Surgeon: Lynwood JONETTA Collum, MD;  Location: [facility] OR;  Service: Vascular;  Laterality: Left;   LIGATION OF ARTERIOVENOUS  FISTULA Right 10/11/2023   Procedure: EXCISION AND LIGATION OF RIGHT ARM ARTERIOVENOUS  FISTULA;  Surgeon: Sheree Penne Bruckner, MD;  Location: [facility] OR;  Service: Vascular;  Laterality: Right;   PARATHYROIDECTOMY N/A 11/09/2021   Procedure: TOTAL  PARATHYROIDECTOMY;  Surgeon: Eletha Boas, MD;  Location: [facility] OR;  Service: General;  Laterality: N/A;   PERIPHERAL VASCULAR BALLOON ANGIOPLASTY  01/08/2018   Procedure: PERIPHERAL VASCULAR BALLOON ANGIOPLASTY;  Surgeon: Sheree Penne Bruckner, MD;  Location: [facility] INVASIVE CV LAB;  Service: Cardiovascular;;  innominate vein   THROMBECTOMY W/ EMBOLECTOMY Right 11/16/2016   Procedure: THROMBECTOMY REVISION OF ARTERIOVENOUS FISTULA - RIGHT ARM;  Surgeon: Boas JULIANNA Doing, MD;  Location: [facility] OR;  Service: Vascular;  Laterality: Right;   TUBAL LIGATION  2014   Patient Active Problem List   Diagnosis Date Noted   Chronic anticoagulation 10/05/2022   Chronic deep vein thrombosis (DVT) of right upper extremity (HCC) 07/19/2022   Kidney transplanted 06/19/2022   Encounter for monitoring tacrolimus therapy 06/04/2022   Immunosuppression 05/31/2022   Low grade squamous intraepithelial lesion on cytologic smear of cervix (LGSIL) 04/18/2022   Cervical cancer screening 10/17/2021   Uterine prolapse 10/17/2021   Dyspareunia in female 10/17/2021   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   Allergy, unspecified, initial encounter 06/15/2020   Gastroesophageal reflux disease without esophagitis 11/01/2017   Encounter for immunization 07/24/2016   Encounter for removal of sutures 01/07/2016   Bacteremia 08/06/2015   Encounter for screening for respiratory tuberculosis 04/19/2015   Coagulation defect, unspecified 04/19/2015   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   SUBJECTIVE:                                                                                                                                                                                            SUBJECTIVE STATEMENT: Pain for 5 years. She had therapy before for  the pelvic floor. Feels a bulge with walking a lot at work.    FUNCTIONAL LIMITATIONS: intercourse  PERTINENT HISTORY:  Medications for current condition: Baclofen  Surgeries: Cesarean section; 1 kidney transplant 2 years ago Other: ESRD; Hypertension; Hypothyroidism; dialysis 12 years;  Sexual abuse: No PAIN:  Are you having pain? Yes: NPRS scale: 7/10 Pain location: back, thighs and vaginal  Pain description: burning pain Aggravating factors: walking a lot at work Relieving factors: Tylenol  and do the exercises the doctor told me to do.   PAIN:  Are you having pain? Yes NPRS scale: 3/10 Pain location: Vaginal  Pain type: pain Pain description: intermittent   Aggravating factors: during penile penetration Relieving factors: no vaginal penetration  PRECAUTIONS: None  RED FLAGS: None   WEIGHT BEARING RESTRICTIONS: No  FALLS:  Has patient fallen in last 6 months? No  OCCUPATION: cleaning  ACTIVITY LEVEL : low level  PLOF: Independent  PATIENT GOALS: reduce pain and reduce tension in the muscles   BOWEL MOVEMENT: Pain with bowel movement: Yes at level 5/10 1 time every 2 weeks; pain in lower abdominal and back Type of bowel movement:Type (Bristol Stool Scale) type 4, Frequency daily, Strain no, and Splinting no Fully empty rectum: Yes:   Leakage: No                                                 Bowel urgency: no   URINATION: Pain with urination: Yes at level 6/10 in the lower abdomen Fully empty bladder: Yes:                                           Post-void dribble: Yes sometimes Stream: Strong Urgency: Yes and not able to wait to urinate Frequency:during the day 6                                                         Nocturia: No   Leakage: Urge to void, Walking to the bathroom, Coughing, Sneezing, Laughing, and when she has a full bladder; leaks small drops Pads/briefs: No  INTERCOURSE:Pain with intercourse  Ability to have vaginal penetration Yes  Pain with intercourse: During Penetration Dryness: No Climax: yes Marinoff Scale: 1/3 Lubricant:no  PREGNANCY: Vaginal deliveries 1 Tearing No Episiotomy No C-section deliveries 2 Currently pregnant No  PROLAPSE: Bulge   OBJECTIVE:  Note: Objective measures were completed at Evaluation unless otherwise noted.  DIAGNOSTIC FINDINGS:  none    COGNITION: Overall cognitive status: Within functional limits for tasks assessed     SENSATION: Light touch: Appears intact   POSTURE: rounded shoulders, forward head, and decreased lumbar lordosis   LUMBARAROM/PROM:  A/PROM A/PROM  Eval (% available)  Flexion 75 with lumbar pain   (Blank rows = not tested)  LOWER EXTREMITY ROM: tight with hip ER bilaterally   LOWER EXTREMITY MMT:  MMT Right eval Left eval  Hip flexion 4/5 with pain 5/5 no pain  Hip extension 4/5 4/5  Hip abduction 4/5 4/5  Hip adduction 5/5 5/5   (Blank rows = not tested) PALPATION:  Abdominal: tenderness located on the lower abdomen, difficulty with contracting the lower abdomen  Diastasis: No Distortion: Yes  along the c-section scar Breathing: decreased lower rib cage mobility Scar tissue: Yes: restrictions along the c-section scar, and scar along the right lateral abdominal                 External Perineal Exam: Q-tip test with pain along the  6 O'Clock ; tenderness located on the ischiocavernosus, bulbocavernosus, perineal body, levator ani externally                             Internal Pelvic Floor: not able to assess internally due to pain with insertion of Q-tip  Patient confirms identification and approves PT to assess internal pelvic floor and treatment Yes All internal or external  pelvic floor assessments and/or treatments are completed with proper hand hygiene and gloves hands. If needed gloves are changed with hand hygiene during patient care time. No emotional/communication barriers or cognitive limitation. Patient is motivated to learn. Patient understands and agrees with treatment goals and plan. PT explains patient will be examined in standing, sitting, and lying down to see how their muscles and joints work. When they are ready, they will be asked to remove their underwear so PT can examine their perineum. The patient is also given the option of providing their own chaperone as one is not provided in our facility. The patient also has the right and is explained the right to defer or refuse any part of the evaluation or treatment including the internal exam. With the patient's consent, PT will use one gloved finger to gently assess the muscles of the pelvic floor, seeing how well it contracts and relaxes and if there is muscle symmetry. After, the patient will get dressed and PT and patient will discuss exam findings and plan of care. PT and patient discuss plan of care, schedule, attendance policy and HEP activities.   PELVIC MMT: not able to assess due to pain with Q-tip being placed into the vaginal exam   MMT eval  Vaginal   Internal Anal Sphincter   External Anal Sphincter   Puborectalis   (Blank rows = not tested)         PROLAPSE: Anterior wall weakness  TODAY'S TREATMENT:                                                                                                                              DATE: 08/28/24  EVAL Examination completed, findings reviewed, pt educated on POC, HEP, and female pelvic floor anatomy, reasoning with pelvic floor assessment internally with pt consent. Pt motivated to participate in PT and agreeable to attempt recommendations.     PATIENT EDUCATION:  Education details: educated on scar massage Person educated: Patient Education  method: Explanation, Demonstration, Tactile cues, Verbal cues, and Handouts Education comprehension: verbalized understanding, returned demonstration, verbal cues required, tactile  cues required, and needs further education  HOME EXERCISE PROGRAM: See above  ASSESSMENT:  CLINICAL IMPRESSION: Patient is a 38 y.o. female who was seen today for physical therapy evaluation and treatment for dyspareunia and chronic pelvic pain. Patient reports pain with penile penetration at level 3/10. She has lumbar and lower abdominal pain at level 7/10 with bowel movements, urination and walking a lot. Patient does have anterior weakness when bulging the pelvic floor supine. She leaks urine with urge to void, walking to the bathroom, coughing, sneezing, laughing, and when she has a full bladder. She will  leak small drops. She has weakness in her hips. Her c-section scar is adhered and tender. She has tenderness in the lower abdomen, along the ischiocavernosus, bulbocavernosus, perineal body, and levator ani. She has positive Q-tip test at 6 O'clock and pain with Q-tip inserted into the vaginal canal. Patient will benefit from skilled therapy to reduce pain, improve defecation and urination and improve overall quality of lift.   OBJECTIVE IMPAIRMENTS: decreased activity tolerance, decreased coordination, decreased ROM, decreased strength, increased fascial restrictions, increased muscle spasms, postural dysfunction, and pain.   ACTIVITY LIMITATIONS: standing, continence, toileting, and locomotion level  PARTICIPATION LIMITATIONS: cleaning, laundry, interpersonal relationship, shopping, and community activity  PERSONAL FACTORS: 1-2 comorbidities: Cesarean section; 1 kidney transplant 2 years ago are also affecting patient's functional outcome.   REHAB POTENTIAL: Excellent  CLINICAL DECISION MAKING: Evolving/moderate complexity  EVALUATION COMPLEXITY: Moderate   GOALS: Goals reviewed with patient?  Yes  SHORT TERM GOALS: Target date: 09/25/24  Patient educated on scar massage to reduce pain and improve tissue mobility.  Baseline: Goal status: INITIAL  2.  Patient is able to perform diaphragmatic breathing to relax her pelvic floor.  Baseline:  Goal status: INITIAL  3.  Patient independent with KNACK, urge suppression, hip stretches for HEP.  Baseline:  Goal status: INITIAL   LONG TERM GOALS: Target date: 02/26/24  Patient independent with advanced HEP for core and pelvic floor strength.  Baseline:  Goal status: INITIAL  2.  Patient is able to urinate and have a bowel movement with pain level </= 2/10 due to improve tissue mobility.  Baseline:  Goal status: INITIAL  3.  Patient is able to have a full bladder with pain level </= 2/10.  Baseline:  Goal status: INITIAL  4.  Patient is able to do a full day of work without feeling a bulge and pain decreased </= 2/10.  Baseline:  Goal status: INITIAL  5.  Patient is able to have penile penetration vaginally with pain level </= 1-2/10.  Baseline:  Goal status: INITIAL  6.  Patient reports her urinary leakage has decreased >/= 75% due to the ability to fully relax and contract the pelvic floor  Baseline:  Goal status: INITIAL  PLAN:  PT FREQUENCY: 1-2x/week  PT DURATION: 6 months  PLANNED INTERVENTIONS: 97110-Therapeutic exercises, 97530- Therapeutic activity, 97112- Neuromuscular re-education, 97535- Self Care, [id number]- Manual therapy, and Patient/Family education; manual mobilization, moist heat, cryotherapy, scar massage, pelvic floor EMG;   PLAN FOR NEXT SESSION: scar massage, manual work to lumbar and abdomen, work on rib mobility, diaphragmatic breathing, hip stretches   Channing Pereyra, PT 08/28/24 11:56 AM'

## 2024-08-28 ENCOUNTER — Ambulatory Visit: Attending: Obstetrics and Gynecology | Admitting: Physical Therapy

## 2024-08-28 ENCOUNTER — Encounter: Payer: Self-pay | Admitting: Physical Therapy

## 2024-08-28 ENCOUNTER — Other Ambulatory Visit: Payer: Self-pay

## 2024-08-28 DIAGNOSIS — R252 Cramp and spasm: Secondary | ICD-10-CM | POA: Insufficient documentation

## 2024-08-28 DIAGNOSIS — L905 Scar conditions and fibrosis of skin: Secondary | ICD-10-CM | POA: Insufficient documentation

## 2024-08-28 DIAGNOSIS — M6281 Muscle weakness (generalized): Secondary | ICD-10-CM | POA: Insufficient documentation

## 2024-08-28 DIAGNOSIS — G8929 Other chronic pain: Secondary | ICD-10-CM | POA: Diagnosis not present

## 2024-08-28 DIAGNOSIS — N941 Unspecified dyspareunia: Secondary | ICD-10-CM | POA: Insufficient documentation

## 2024-08-28 DIAGNOSIS — R102 Pelvic and perineal pain unspecified side: Secondary | ICD-10-CM | POA: Diagnosis present

## 2024-08-28 DIAGNOSIS — R1030 Lower abdominal pain, unspecified: Secondary | ICD-10-CM | POA: Insufficient documentation

## 2024-08-28 DIAGNOSIS — M5459 Other low back pain: Secondary | ICD-10-CM | POA: Insufficient documentation

## 2024-09-04 ENCOUNTER — Ambulatory Visit: Admitting: Obstetrics and Gynecology

## 2024-09-21 ENCOUNTER — Ambulatory Visit: Attending: Internal Medicine | Admitting: Internal Medicine

## 2024-09-21 ENCOUNTER — Encounter: Payer: Self-pay | Admitting: Internal Medicine

## 2024-09-21 VITALS — BP 122/82 | HR 80 | Temp 98.3°F | Ht 59.0 in | Wt 121.0 lb

## 2024-09-21 DIAGNOSIS — Z940 Kidney transplant status: Secondary | ICD-10-CM

## 2024-09-21 DIAGNOSIS — I10 Essential (primary) hypertension: Secondary | ICD-10-CM

## 2024-09-21 DIAGNOSIS — Z23 Encounter for immunization: Secondary | ICD-10-CM | POA: Diagnosis not present

## 2024-09-21 DIAGNOSIS — Z2821 Immunization not carried out because of patient refusal: Secondary | ICD-10-CM

## 2024-09-21 DIAGNOSIS — Z0000 Encounter for general adult medical examination without abnormal findings: Secondary | ICD-10-CM | POA: Diagnosis not present

## 2024-09-21 DIAGNOSIS — E039 Hypothyroidism, unspecified: Secondary | ICD-10-CM | POA: Diagnosis not present

## 2024-09-21 DIAGNOSIS — R0981 Nasal congestion: Secondary | ICD-10-CM

## 2024-09-21 DIAGNOSIS — Z789 Other specified health status: Secondary | ICD-10-CM

## 2024-09-21 NOTE — Progress Notes (Signed)
 Patient ID: Karla Garner, female    DOB: 05/28/1986  MRN: [id number]  CC: Annual Exam (Physical. /Complete work physical form/Flu vax administered on 09/21/24 - C.A.)   Subjective: Karla Garner is a 38 y.o. 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 Liana [id number]   Discussed the use of AI scribe software for clinical note transcription with the patient, who gave verbal consent to proceed.  History of Present Illness   Karla Garner is a 38 year old female who presents for an annual physical exam and employer wellness program documentation.  She is due for a flu shot and COVID booster. She is unsure about her HPV vaccination status. She had one HPV vaccine in 2023. Looks like she had 2 hep B vaccines in 2016. Up to date with pap done 06/2024.  She has a history of kidney transplant and is currently under the care of a nephrologist. She is taking prednisone 5 mg daily and Myfortic, with a regimen of two capsules twice per day and Prograft, though she is unsure of the exact dosages. She follows up with Dr. Gearline at [facility] every three months, with her last visit in October and the next scheduled for January. She recently had lab work done in October. DBP today slightly elevated which she thinks may be due to coffee she drank this morning. Reports BP was good at visit with nephrologist in October.  She is also taking levothyroxine  75 mcg daily for thyroid  management. It has been a while since her thyroid  levels were checked.  She experiences sinus congestion, especially when it's hot, and has been using a  nasal spray for three years, which she finds helpful. Reports nasal spray was Vicks but was told by specialist not to use it.  She describes feeling that both nostrils are blocked, making it difficult to breathe.  No hearing or vision problems, shortness of breath, chest pain, chronic cough, or urinary issues. Her menstrual cycles are irregular, which she attributes to previous Depo injections, though she is no longer on them.  She exercises on a treadmill at home two to three times per day for ten to fifteen minutes each session. Does well with eating habits.       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     Cholecalciferol (VITAMIN D-3) 125 MCG (5000 UT) TABS Take 5,000 Units by mouth daily.     levothyroxine  (SYNTHROID ) 75 MCG tablet Take 1 tablet (75 mcg total) by mouth Daily at 0600. 30 tablet 2   magnesium  oxide (MAG-OX) 400 (240 Mg) MG tablet Take 1 tablet (400  mg total) by mouth daily. 100 tablet 0   medroxyPROGESTERone  (DEPO-PROVERA ) 150 MG/ML injection Inject 150 mg into the muscle every 3 (three) months.     Multiple Vitamins-Minerals (MULTIVITAMIN WOMEN PO) Take by mouth.     mycophenolate (MYFORTIC) 180 MG EC tablet Take 360 mg by mouth 2 (two) times daily.     omeprazole (PRILOSEC) 40 MG capsule Take 40 mg by mouth 2 (two) times daily before a meal.     predniSONE (DELTASONE) 5 MG tablet Take 5 mg by mouth daily.     sulfamethoxazole-trimethoprim (BACTRIM) 400-80 MG tablet 1 tablet by mouth every Monday, Wednesday, Friday     tacrolimus (PROGRAF) 1 MG capsule Take 2 mg by mouth 2 (two) times daily.     B Complex-C-Zn-Folic Acid  (DIALYVITE 800-ZINC 15) 0.8 MG TABS Take 1 tablet by mouth daily. (Patient not taking: Reported on 09/21/2024)     No current facility-administered medications on file prior to visit.    No Known Allergies  Social History   Socioeconomic History   Marital status: Married    Spouse name: Not on file   Number of children: 3   Years of education: Not on file   Highest education level: 4th grade  Occupational History   Occupation: unemployed  Tobacco Use   Smoking status: Never   Smokeless tobacco: Never  Vaping Use   Vaping status: Never Used  Substance and Sexual Activity   Alcohol  use: No    Alcohol /week: 0.0 standard drinks of alcohol    Drug use: No   Sexual activity: Not Currently    Partners: Male    Birth control/protection: Surgical  Other Topics Concern   Not on file  Social History Narrative   As of February 2018 she has 3 children with whom she lives they are aged 54, 64 and 2.    Social Drivers of Health   Financial Resource Strain: Medium Risk (09/05/2023)   Overall Financial Resource Strain (CARDIA)    Difficulty of Paying Living Expenses: Somewhat hard  Food Insecurity: Food Insecurity Present (07/02/2024)   Hunger Vital Sign    Worried About Running Out of Food in the Last Year: Never  true    Ran Out of Food in the Last Year: Sometimes true  Transportation Needs: Unmet Transportation Needs (07/02/2024)   PRAPARE - Administrator, Civil Service (Medical): Yes    Lack of Transportation (Non-Medical): No  Physical Activity: Sufficiently Active (09/05/2023)   Exercise Vital Sign    Days of Exercise per Week: 7 days    Minutes of Exercise per Session: 30 min  Stress: No Stress Concern Present (09/05/2023)   [employer] of Occupational Health - Occupational Stress Questionnaire    Feeling of Stress : Only a little  Social Connections: Unknown (09/05/2023)   Social Connection and Isolation Panel    Frequency of Communication with Friends and Family: Patient declined    Frequency of Social Gatherings with Friends and Family: More than three times a week    Attends Religious Services: Patient declined    Active Member of Clubs or Organizations: No    Attends [employer]  or Organization Meetings: Not on file    Marital Status: Married  Catering Manager Violence: Not on file    Family History  Problem Relation Age of Onset   Other Mother        no medical problems per patient   Other Father        no medical problems per patient   Colon cancer Neg Hx    Liver cancer Neg Hx    Stomach cancer Neg Hx     Past Surgical History:  Procedure Laterality Date   A/V FISTULAGRAM N/A 01/08/2018   Procedure: A/V FISTULAGRAM - right arm;  Surgeon: Sheree Penne Bruckner, MD;  Location: [facility] INVASIVE CV LAB;  Service: Cardiovascular;  Laterality: N/A;   AV FISTULA PLACEMENT Left    AV FISTULA PLACEMENT Right 04/14/2015   Procedure: CREATION OF RIGHT RADIOCEPHALIC ARTERIOVENOUS (AV) FISTULA ;  Surgeon: Bruckner GORMAN Blade, MD;  Location: [facility] OR;  Service: Vascular;  Laterality: Right;   AV FISTULA PLACEMENT Right 11/22/2016   Procedure: ARTERIOVENOUS (AV) FISTULA CREATION;  Surgeon: Penne Bruckner Sheree, MD;  Location: [facility] OR;  Service: Vascular;  Laterality: Right;    CESAREAN SECTION     2009   ESOPHAGOGASTRODUODENOSCOPY N/A 11/24/2016   Procedure: ESOPHAGOGASTRODUODENOSCOPY (EGD);  Surgeon: Gwendlyn ONEIDA Buddy, MD;  Location: [facility] ENDOSCOPY;  Service: Endoscopy;  Laterality: N/A;   EXCHANGE OF A DIALYSIS CATHETER Right 04/14/2015   Procedure: EXCHANGE OF RIGHT INTERNAL JUGULAR DIALYSIS CATHETER;  Surgeon: Bruckner GORMAN Blade, MD;  Location: [facility] OR;  Service: Vascular;  Laterality: Right;   INSERTION OF DIALYSIS CATHETER Left 11/22/2016   Procedure: INSERTION OF DIALYSIS CATHETER - LEFT INTERNAL JUGULAR PLACEMENT;  Surgeon: Penne Bruckner Sheree, MD;  Location: [facility] OR;  Service: Vascular;  Laterality: Left;   IR AV DIALY SHUNT INTRO NEEDLE/INTRAC INITIAL W/PTA/STENT/IMG RT Right 02/15/2020   IR AV DIALY SHUNT INTRO NEEDLE/INTRACATH INITIAL W/PTA/IMG LEFT  10/18/2020   IR DIALY SHUNT INTRO NEEDLE/INTRACATH INITIAL W/IMG RIGHT Right 07/26/2017   IR DIALY SHUNT INTRO NEEDLE/INTRACATH INITIAL W/IMG RIGHT Right 10/18/2017   IR DIALY SHUNT INTRO NEEDLE/INTRACATH INITIAL W/IMG RIGHT Right 03/10/2018   IR DIALY SHUNT INTRO NEEDLE/INTRACATH INITIAL W/IMG RIGHT Right 12/30/2018   IR PTA ADDL CENTRAL DIALYSIS SEG THRU DIALY CIRCUIT LEFT Left 10/18/2020   IR RADIOLOGIST EVAL & MGMT  07/02/2019   IR REMOVAL TUN CV CATH W/O FL  03/06/2017   IR TRANSCATH PLC STENT 1ST ART NOT LE CV CAR VERT CAR  12/30/2018   IR US  GUIDE VASC ACCESS RIGHT  12/30/2018   IR US  GUIDE VASC ACCESS RIGHT  12/30/2018   IR US  GUIDE VASC ACCESS RIGHT  02/15/2020   IR VENO/EXT/UNI LEFT  12/30/2018   LIGATION OF ARTERIOVENOUS  FISTULA Left 11/05/2014   Procedure: LIGATION OF LEFT ARM  BRACHIO-CEPHALIC ARTERIOVENOUS  FISTULA ,& REPAIR OF BRACHIAL ARTERY.;  Surgeon: Lynwood JONETTA Collum, MD;  Location: [facility] OR;  Service: Vascular;  Laterality: Left;   LIGATION OF ARTERIOVENOUS  FISTULA Right 10/11/2023   Procedure: EXCISION AND LIGATION OF RIGHT ARM ARTERIOVENOUS  FISTULA;  Surgeon: Sheree Penne Bruckner, MD;  Location: [facility]  OR;  Service: Vascular;  Laterality: Right;   PARATHYROIDECTOMY N/A 11/09/2021   Procedure: TOTAL PARATHYROIDECTOMY;  Surgeon: Eletha Boas, MD;  Location: [facility] OR;  Service: General;  Laterality: N/A;   PERIPHERAL VASCULAR BALLOON ANGIOPLASTY  01/08/2018   Procedure: PERIPHERAL VASCULAR BALLOON ANGIOPLASTY;  Surgeon: Sheree Penne Bruckner, MD;  Location: [facility] INVASIVE CV LAB;  Service: Cardiovascular;;  innominate vein   THROMBECTOMY W/ EMBOLECTOMY Right 11/16/2016   Procedure: THROMBECTOMY REVISION OF ARTERIOVENOUS FISTULA - RIGHT ARM;  Surgeon: Krystal JULIANNA Doing, MD;  Location: [facility] OR;  Service: Vascular;  Laterality: Right;   TUBAL LIGATION  2014    ROS: Review of Systems Negative except as stated above  PHYSICAL EXAM: BP 122/82   Pulse 80   Temp 98.3 F (36.8 C) (Oral)   Ht 4' 11 (1.499 m)   Wt 121 lb (54.9 kg)   SpO2 98%   BMI 24.44 kg/m   Physical Exam  General appearance - alert, well appearing, young Hispanic female and in no distress Mental status - normal mood, behavior, speech, dress, motor activity, and thought processes Eyes - pupils equal and reactive, extraocular eye movements intact Ears - bilateral TM's and external ear canals normal. Small amount wax at opening to RT ear canal Nose - normal and patent, no erythema, discharge or polyps Mouth - mucous membranes moist, pharynx normal without lesions Neck - supple, no significant adenopathy Lymphatics - no palpable lymphadenopathy, no hepatosplenomegaly Chest - clear to auscultation, no wheezes, rales or rhonchi, symmetric air entry Heart - normal rate, regular rhythm, normal S1, S2, no murmurs, rubs, clicks or gallops Abdomen - soft, nontender, nondistended, no masses or organomegaly Musculoskeletal - no joint tenderness, deformity or swelling Extremities - peripheral pulses normal, no pedal edema, no clubbing or cyanosis Skin - normal coloration and turgor, no rashes, no suspicious skin lesions noted      Latest  Ref Rng & Units 10/11/2023    6:24 AM 03/21/2022    3:29 PM 01/11/2022    9:16 PM  CMP  Glucose 70 - 99 mg/dL 93   83   BUN 6 - 20 mg/dL 27   46   Creatinine 9.55 - 1.00 mg/dL 9.09   0.69   Sodium 864 - 145 mmol/L 140   137   Potassium 3.5 - 5.1 mmol/L 4.1   4.5   Chloride 98 - 111 mmol/L 110   96   Calcium  8.7 - 10.2 mg/dL  9.6     Lipid Panel     Component Value Date/Time   CHOL 163 08/30/2022 1138   TRIG 212 (H) 08/30/2022 1138   HDL 49 08/30/2022 1138   CHOLHDL 3.3 08/30/2022 1138   CHOLHDL 3.6 11/17/2016 0517   VLDL 14 11/17/2016 0517   LDLCALC 79 08/30/2022 1138    CBC    Component Value Date/Time   WBC 5.5 03/21/2022 1529   WBC 9.5 01/11/2022 1729   RBC 4.30 03/21/2022 1529   RBC 3.78 (L) 01/11/2022 1729   HGB 16.7 (H) 10/11/2023 0624   HGB 13.1 03/21/2022 1529   HCT 49.0 (H) 10/11/2023 0624   HCT 38.4 03/21/2022 1529   PLT 214 03/21/2022 1529   MCV 89 03/21/2022 1529   MCH 30.5 03/21/2022 1529   MCH 30.2 01/11/2022 1729   MCHC 34.1 03/21/2022 1529   MCHC 32.9 01/11/2022 1729   RDW 14.4 03/21/2022 1529   LYMPHSABS 1.6 03/21/2022 1529   MONOABS 0.9 01/11/2022 1729   EOSABS 0.1 03/21/2022 1529   BASOSABS 0.0 03/21/2022 1529    ASSESSMENT AND PLAN: 1. Annual physical exam (Primary) Doing well. Continue healthy eating and regular exercise.  2. Acquired hypothyroidism Continue Levothyroxine  - TSH; Future  3. Kidney transplanted Followed by Dr. Gearline Q 3 mths. On Immunosuppressants. Will have her sign release.  4. Need for immunization against influenza - Flu vaccine trivalent  PF, 6mos and older(Flulaval,Afluria,Fluarix,Fluzone)  5. COVID-19 vaccination declined Recommended but pt declined  6. Hepatitis B vaccination status unknown Check to make sure she is adequately immunized - Hepatitis B surface antibody,quantitative; Future - Hepatitis B surface antigen; Future  7. Sinus congestion Try OTC Flonase nasal spray PRN  8. Hypertension DBP  mildly elev today but reports good readings with nephrology. DASH encouraged. Observe off med  Form completed for work insurance documenting she has had PAP this yr.  Patient was given the opportunity to ask questions.  Patient verbalized understanding of the plan and was able to repeat key elements of the plan.   This documentation was completed using Paediatric nurse.  Any transcriptional errors are unintentional.  Orders Placed This Encounter  Procedures   Flu vaccine trivalent PF, 6mos and older(Flulaval,Afluria,Fluarix,Fluzone)   Hepatitis B surface antibody,quantitative   TSH   Hepatitis B surface antigen     Requested Prescriptions    No prescriptions requested or ordered in this encounter    Return if symptoms worsen or fail to improve, for Lab appt in 2-4 days. Sign release for records from [employer].  Barnie Louder, MD, FACP

## 2024-09-21 NOTE — Patient Instructions (Signed)
  VISIT SUMMARY: Today, you had your annual physical exam and we discussed your overall health and wellness. We reviewed your current medications, immunization status, and addressed your concerns about nasal congestion and thyroid  management. We also discussed your kidney transplant follow-up and ordered necessary lab tests.  YOUR PLAN: -ADULT WELLNESS VISIT: Your routine wellness visit showed a slightly elevated diastolic blood pressure at 82 mmHg. This means the lower number in your blood pressure reading is a bit high. We rechecked your blood pressure today and recommend you continue your regular exercise routine.  -KIDNEY TRANSPLANT STATUS: You are doing well post-kidney transplant and are on immunosuppressive therapy to prevent organ rejection. We signed a release to obtain your records from Dr. Gearline and requested your recent lab results. We also ordered blood tests to check your kidney function, liver function, and blood cell count.  -HYPOTHYROIDISM: Hypothyroidism means your thyroid  gland is underactive and not producing enough hormones. You are currently managing this with levothyroxine  75 mcg daily. We ordered blood tests to check your thyroid  levels since they haven't been checked recently.  -IMMUNIZATION MANAGEMENT: We administered your flu shot today. We checked your records for your COVID booster status and will schedule a nurse-only visit to complete your HPV vaccine series.  -CHRONIC NASAL CONGESTION: You have been experiencing nasal congestion, especially in hot weather. We recommend using Flonase nasal spray for relief and advise against the daily use of your current nasal spray.  INSTRUCTIONS: Please follow up with Dr. Gearline as scheduled in January. We will contact you to schedule a nurse-only visit for your HPV vaccine series completion. Continue your regular exercise routine and use Flonase as recommended for nasal congestion. We will notify you with your lab results once they are  available.                      Contains text generated by [employer]bridge.                                 Contains text generated by [employer]bridge.

## 2024-09-28 ENCOUNTER — Ambulatory Visit

## 2024-10-20 ENCOUNTER — Ambulatory Visit: Payer: Self-pay | Admitting: Obstetrics and Gynecology

## 2024-11-05 ENCOUNTER — Other Ambulatory Visit (HOSPITAL_COMMUNITY)
Admission: RE | Admit: 2024-11-05 | Discharge: 2024-11-05 | Disposition: A | Source: Ambulatory Visit | Attending: Obstetrics and Gynecology | Admitting: Obstetrics and Gynecology

## 2024-11-05 ENCOUNTER — Encounter: Payer: Self-pay | Admitting: Obstetrics and Gynecology

## 2024-11-05 ENCOUNTER — Other Ambulatory Visit: Payer: Self-pay

## 2024-11-05 ENCOUNTER — Ambulatory Visit: Admitting: Obstetrics and Gynecology

## 2024-11-05 VITALS — BP 136/95 | HR 96 | Wt 125.0 lb

## 2024-11-05 DIAGNOSIS — R87612 Low grade squamous intraepithelial lesion on cytologic smear of cervix (LGSIL): Secondary | ICD-10-CM | POA: Diagnosis present

## 2024-11-05 MED ORDER — ACETAMINOPHEN 500 MG PO TABS
500.0000 mg | ORAL_TABLET | Freq: Once | ORAL | Status: AC
  Administered 2024-11-05: 500 mg via ORAL

## 2024-11-05 NOTE — Progress Notes (Signed)
" ° ° °  GYNECOLOGY OFFICE COLPOSCOPY PROCEDURE NOTE  39 y.o. [id number] here for colposcopy for low-grade squamous intraepithelial neoplasia (LGSIL - encompassing HPV,mild dysplasia,CIN I) pap smear on 06/2024. Discussed role for HPV in cervical dysplasia, need for surveillance.  Patient gave informed written consent, time out was performed.  Placed in lithotomy position. Cervix viewed with speculum and colposcope after application of acetic acid.   Colposcopy adequate? Yes  acetowhite lesion(s) noted at 6 o'clock; corresponding biopsies obtained.  ECC specimen obtained. All specimens were labeled and sent to pathology.  Chaperone was present during entire procedure.  Patient was given post procedure instructions.  Will follow up pathology and manage accordingly; patient will be contacted with results and recommendations.  Routine preventative health maintenance measures emphasized.   Carter Quarry, MD, FACOG Minimally Invasive Gynecologic Surgery  Obstetrics and Gynecology, [facility], [facility] 11/05/2024  "

## 2024-11-12 LAB — SURGICAL PATHOLOGY

## 2024-11-13 ENCOUNTER — Ambulatory Visit: Payer: Self-pay | Admitting: Obstetrics and Gynecology
